# Patient Record
Sex: Male | Born: 1945 | Race: White | Hispanic: No | Smoking: Former smoker
Health system: Southern US, Community
[De-identification: ages and names within clinical notes are randomized; demographics above are authoritative.]

## PROBLEM LIST (undated history)

## (undated) DIAGNOSIS — E785 Hyperlipidemia, unspecified: Secondary | ICD-10-CM

## (undated) DIAGNOSIS — R0600 Dyspnea, unspecified: Secondary | ICD-10-CM

## (undated) DIAGNOSIS — Z720 Tobacco use: Secondary | ICD-10-CM

## (undated) DIAGNOSIS — I10 Essential (primary) hypertension: Secondary | ICD-10-CM

## (undated) DIAGNOSIS — R001 Bradycardia, unspecified: Secondary | ICD-10-CM

## (undated) DIAGNOSIS — I2510 Atherosclerotic heart disease of native coronary artery without angina pectoris: Secondary | ICD-10-CM

## (undated) DIAGNOSIS — I739 Peripheral vascular disease, unspecified: Secondary | ICD-10-CM

## (undated) DIAGNOSIS — Z87442 Personal history of urinary calculi: Secondary | ICD-10-CM

## (undated) HISTORY — PX: APPENDECTOMY: SHX54

## (undated) HISTORY — DX: Hyperlipidemia, unspecified: E78.5

## (undated) HISTORY — DX: Peripheral vascular disease, unspecified: I73.9

## (undated) HISTORY — DX: Bradycardia, unspecified: R00.1

## (undated) HISTORY — DX: Atherosclerotic heart disease of native coronary artery without angina pectoris: I25.10

## (undated) HISTORY — PX: ROTATOR CUFF REPAIR: SHX139

## (undated) HISTORY — PX: KNEE ARTHROPLASTY: SHX992

## (undated) HISTORY — DX: Tobacco use: Z72.0

## (undated) HISTORY — PX: FOOT SURGERY: SHX648

## (undated) HISTORY — PX: SHOULDER SURGERY: SHX246

---

## 2001-08-25 HISTORY — PX: CHOLECYSTECTOMY: SHX55

## 2006-08-25 HISTORY — PX: CORONARY ARTERY BYPASS GRAFT: SHX141

## 2006-12-07 ENCOUNTER — Ambulatory Visit: Payer: Self-pay | Admitting: Cardiology

## 2006-12-10 ENCOUNTER — Ambulatory Visit: Payer: Self-pay | Admitting: Cardiovascular Disease

## 2006-12-10 ENCOUNTER — Inpatient Hospital Stay (HOSPITAL_BASED_OUTPATIENT_CLINIC_OR_DEPARTMENT_OTHER): Admission: RE | Admit: 2006-12-10 | Discharge: 2006-12-10 | Payer: Self-pay | Admitting: Cardiovascular Disease

## 2006-12-17 ENCOUNTER — Ambulatory Visit (HOSPITAL_COMMUNITY): Admission: RE | Admit: 2006-12-17 | Discharge: 2006-12-17 | Payer: Self-pay | Admitting: Cardiothoracic Surgery

## 2006-12-17 ENCOUNTER — Ambulatory Visit: Payer: Self-pay | Admitting: Cardiothoracic Surgery

## 2006-12-22 ENCOUNTER — Ambulatory Visit: Payer: Self-pay | Admitting: Cardiology

## 2006-12-22 ENCOUNTER — Inpatient Hospital Stay (HOSPITAL_COMMUNITY): Admission: RE | Admit: 2006-12-22 | Discharge: 2006-12-27 | Payer: Self-pay | Admitting: Cardiothoracic Surgery

## 2006-12-22 ENCOUNTER — Ambulatory Visit: Payer: Self-pay | Admitting: Cardiothoracic Surgery

## 2007-01-15 ENCOUNTER — Ambulatory Visit: Payer: Self-pay | Admitting: Physician Assistant

## 2007-01-15 ENCOUNTER — Encounter: Payer: Self-pay | Admitting: Cardiology

## 2007-01-21 ENCOUNTER — Ambulatory Visit: Payer: Self-pay | Admitting: Cardiothoracic Surgery

## 2007-02-11 ENCOUNTER — Ambulatory Visit: Payer: Self-pay | Admitting: Cardiothoracic Surgery

## 2007-03-16 ENCOUNTER — Ambulatory Visit (HOSPITAL_COMMUNITY): Admission: RE | Admit: 2007-03-16 | Discharge: 2007-03-16 | Payer: Self-pay | Admitting: Cardiology

## 2007-03-16 ENCOUNTER — Ambulatory Visit: Payer: Self-pay | Admitting: Cardiology

## 2007-09-28 ENCOUNTER — Ambulatory Visit: Payer: Self-pay | Admitting: Cardiology

## 2007-10-21 ENCOUNTER — Encounter: Payer: Self-pay | Admitting: Cardiology

## 2008-04-25 ENCOUNTER — Ambulatory Visit: Payer: Self-pay | Admitting: Cardiology

## 2009-06-02 DIAGNOSIS — I2510 Atherosclerotic heart disease of native coronary artery without angina pectoris: Secondary | ICD-10-CM | POA: Insufficient documentation

## 2009-06-02 DIAGNOSIS — E785 Hyperlipidemia, unspecified: Secondary | ICD-10-CM | POA: Insufficient documentation

## 2009-06-25 ENCOUNTER — Telehealth (INDEPENDENT_AMBULATORY_CARE_PROVIDER_SITE_OTHER): Payer: Self-pay | Admitting: *Deleted

## 2009-07-11 ENCOUNTER — Ambulatory Visit: Payer: Self-pay | Admitting: Cardiology

## 2009-07-11 DIAGNOSIS — I10 Essential (primary) hypertension: Secondary | ICD-10-CM | POA: Insufficient documentation

## 2009-07-18 ENCOUNTER — Encounter: Payer: Self-pay | Admitting: Cardiology

## 2009-08-01 ENCOUNTER — Telehealth (INDEPENDENT_AMBULATORY_CARE_PROVIDER_SITE_OTHER): Payer: Self-pay | Admitting: *Deleted

## 2009-08-01 ENCOUNTER — Encounter: Payer: Self-pay | Admitting: Cardiology

## 2009-10-04 ENCOUNTER — Encounter: Payer: Self-pay | Admitting: Cardiology

## 2009-10-10 ENCOUNTER — Encounter: Payer: Self-pay | Admitting: Cardiology

## 2009-12-10 ENCOUNTER — Telehealth: Payer: Self-pay | Admitting: Cardiology

## 2010-01-17 ENCOUNTER — Encounter: Payer: Self-pay | Admitting: Cardiology

## 2010-01-17 ENCOUNTER — Encounter (INDEPENDENT_AMBULATORY_CARE_PROVIDER_SITE_OTHER): Payer: Self-pay | Admitting: *Deleted

## 2010-01-30 ENCOUNTER — Encounter: Payer: Self-pay | Admitting: Cardiology

## 2010-01-31 ENCOUNTER — Ambulatory Visit: Payer: Self-pay | Admitting: Cardiology

## 2010-02-07 ENCOUNTER — Ambulatory Visit: Payer: Self-pay | Admitting: Cardiology

## 2010-02-28 ENCOUNTER — Telehealth (INDEPENDENT_AMBULATORY_CARE_PROVIDER_SITE_OTHER): Payer: Self-pay | Admitting: *Deleted

## 2010-03-02 ENCOUNTER — Encounter: Payer: Self-pay | Admitting: Cardiology

## 2010-03-03 ENCOUNTER — Encounter: Payer: Self-pay | Admitting: Cardiology

## 2010-03-03 ENCOUNTER — Ambulatory Visit: Payer: Self-pay | Admitting: Cardiology

## 2010-03-07 ENCOUNTER — Telehealth: Payer: Self-pay | Admitting: Cardiology

## 2010-03-08 ENCOUNTER — Ambulatory Visit: Payer: Self-pay | Admitting: Cardiology

## 2010-03-08 DIAGNOSIS — Z720 Tobacco use: Secondary | ICD-10-CM

## 2010-03-08 DIAGNOSIS — R109 Unspecified abdominal pain: Secondary | ICD-10-CM | POA: Insufficient documentation

## 2010-03-13 ENCOUNTER — Encounter (INDEPENDENT_AMBULATORY_CARE_PROVIDER_SITE_OTHER): Payer: Self-pay | Admitting: *Deleted

## 2010-03-31 ENCOUNTER — Encounter: Payer: Self-pay | Admitting: Cardiology

## 2010-04-01 ENCOUNTER — Encounter: Payer: Self-pay | Admitting: Cardiology

## 2010-04-03 ENCOUNTER — Encounter: Payer: Self-pay | Admitting: Cardiology

## 2010-04-04 ENCOUNTER — Encounter: Payer: Self-pay | Admitting: Cardiology

## 2010-05-03 ENCOUNTER — Encounter: Payer: Self-pay | Admitting: Cardiology

## 2010-08-30 ENCOUNTER — Encounter: Payer: Self-pay | Admitting: Cardiology

## 2010-09-18 ENCOUNTER — Ambulatory Visit
Admission: RE | Admit: 2010-09-18 | Discharge: 2010-09-18 | Payer: Self-pay | Source: Home / Self Care | Attending: Cardiology | Admitting: Cardiology

## 2010-09-24 NOTE — Letter (Signed)
Summary: External Correspondence/ [facility]  External Correspondence/ [facility]   Imported By: Dorise Hiss 03/11/2010 10:31:45  _____________________________________________________________________  External Attachment:    Type:   Image     Comment:   External Document

## 2010-09-24 NOTE — Letter (Signed)
Summary: Engineer, materials at [facility]  [address]   [place], [place] [address]   Phone: [phone]  Fax: [phone]        March 13, 2010 MRN: [id number]    Jack [name] [address] [place], [place]  [address]    Dear Mr. Shelvin,  Your test ordered by Selena Batten has been reviewed by your physician (or physician assistant) and was found to be normal or stable. Your physician (or physician assistant) felt no changes were needed at this time.  ____ Echocardiogram  ____ Cardiac Stress Test  __X__ Lab Work-No anemia  ____ Peripheral vascular study of arms, legs or neck  ____ CT scan or X-ray  ____ Lung or Breathing test  ____ Other:   Thank you.   Cyril Loosen, RN, BSN    Duane Boston, M.D., F.A.C.C. Thressa Sheller, M.D., F.A.C.C. Oneal Grout, M.D., F.A.C.C. Cheree Ditto, M.D., F.A.C.C. Daiva Nakayama, M.D., F.A.C.C. Kenney Houseman, M.D., F.A.C.C. Jeanne Ivan, PA-C

## 2010-09-24 NOTE — Letter (Signed)
Summary: Discharge [facility]  Discharge [facility]   Imported By: Dorise Hiss 03/07/2010 10:02:43  _____________________________________________________________________  External Attachment:    Type:   Image     Comment:   External Document

## 2010-09-24 NOTE — Miscellaneous (Signed)
Summary: Orders Update - FLP/LFT  Clinical Lists Changes  Orders: Added new Test order of T-Hepatic Function ([id number]) - Signed Added new Test order of T-Lipid Profile ([id number]) - Signed 

## 2010-09-24 NOTE — Consult Note (Signed)
Summary: Consultation Report/ CARDIOLOGY  Consultation Report/ CARDIOLOGY   Imported By: Dorise Hiss 03/08/2010 08:24:09  _____________________________________________________________________  External Attachment:    Type:   Image     Comment:   External Document

## 2010-09-24 NOTE — Letter (Signed)
Summary: Pharmacist, community at [facility]. [address]   [place], [place] [address]   Phone: [phone]  Fax: [phone]      [facility]  Cardiolite Stress Test     [place]  Your doctor has ordered a Cardiolite Stress Test to help determine the condition of your heart during stress. If you take blood pressure medicine, ask your doctor if you should take it the day of your test. You should not have anything to eat or drink at least 4 hours before your test is scheduled, and no caffeine (coffee, tea, decaf. or chocolate) for 24 hours before your test.  You will need to register at the Outpatient/Main Entrance at the hospital 30 minutes before your appointment time. It is a good idea to bring a copy of your order with you. They will direct you to the Diagnostic Imaging (Radiology) Department.  You will be asked to undress from the waist up and be given a hospital gown to wear, so dress comfortably from the waist down, for example:    Sweat pants, shorts or skirt   Rubber-soled lace up shoes (i.e. tennis shoes)  Plan on about three hours from registration to release from the hospital.    Date of Test:              Time of Test              Hold Lasix morning of the test  Hold Carvedilol night before and the morning of the test.

## 2010-09-24 NOTE — Progress Notes (Signed)
Summary: PHONE   Phone Note Call from Patient Call back at Home Phone [phone]   Caller: Patient Summary of Call: Has 6 month appt with you on 5/29 states that he thought he was suppose to have a stress test befor office visit. Do not see in last office note you requested any type of test. Can you please advise. Initial call taken by: Zachary George,  December 10, 2009 9:08 AM  Follow-up for Phone Call        Yes he is correct. 3 years post CABG. he remembered correctly from last office visit. Schedule exercise cardiolite before OV Lewayne Bunting, MD, [facility]  December 11, 2009 2:41 PM

## 2010-09-24 NOTE — Assessment & Plan Note (Signed)
Summary: 6 month fu      Allergies Added: NKDA  Visit Type:  Follow-up Primary Provider:  [name]   History of Present Illness: the patient is a 65 year old male with history of coronary arteries, status post coronary bypass grafting with normal LV function in 2008. The patient has a routine myocardial perfusion study. He had relatively poor exercise tolerance but denies any chest pain or shortness of breath. He achieved 4.6 mets. myocardial perfusion imaging showed an inferior scar with some hypokinesis but overall preserved ejection fraction of 55%. The patient denies any chest pain or short of breath. Unfortunate continues to smoke three quarters of a pack a day. He states that is not willing to cut down. He denies any loud snoring or apneic episodes. Is on high-dose statin in his lipid panel as well controlled with an LDL of 58. His HDL is low at 25. The patient is morbidly obese but still gets around pretty good  Preventive Screening-Counseling & Management  Alcohol-Tobacco     Smoking Status: current     Smoking Cessation Counseling: yes     Packs/Day: 3/4 PPD  Current Medications (verified): 1)  Aspirin 325 Mg Tabs (Aspirin) .... Take 1 Tablet By Mouth Once A Day 2)  Lipitor 40 Mg Tabs (Atorvastatin Calcium) .... Take 1 Tablet By Mouth Once A Day 3)  Allopurinol 300 Mg Tabs (Allopurinol) .... Take 1 Tablet By Mouth Once A Day 4)  Zestril 20 Mg Tabs (Lisinopril) .... Take 1 Tablet By Mouth Once A Day  (Place On File) 5)  Celebrex 200 Mg Caps (Celecoxib) .... Take 1 Tablet By Mouth Once A Day 6)  Klor-Con 10 10 Meq Cr-Tabs (Potassium Chloride) .... Take 1 Tablet By Mouth Once A Day 7)  Furosemide 20 Mg Tabs (Furosemide) .... Take 1 Tablet By Mouth Once A Day 8)  Carvedilol 6.25 Mg Tabs (Carvedilol) .... Take 1 Tablet By Mouth Two Times A Day 9)  Lipitor 80 Mg Tabs (Atorvastatin Calcium) .... Take 1 Tab By Mouth At Bedtime  Allergies (verified): No Known Drug  Allergies  Comments:  Nurse/Medical Assistant: The patient is currently on medications but does not know the name or dosage at this time. Instructed to contact our office with details. Will update medication list at that time.  Past History:  Past Medical History: Last updated: 07/11/2009 HYPERLIPIDEMIA-MIXED (ICD-272.4) CAD, ARTERY BYPASS GRAFT (ICD-414.04)  1. Coronary artery disease.     a.     Status post coronary artery bypass graft x4.     b.     Ejection fraction of 79%.     c.     Cardiolite study with normalization of ejection fraction and      no ischemia most recently. 2. Tobacco use. 3. Hyperlipidemia. 4. Status post recent shoulder surgery.   Past Surgical History: Last updated: 06/02/2009 foot Appendectomy Knee Arthroplasty-Total CABG Rotator Cuff Repair  Family History: Last updated: 07/11/2009 Negative FH of Diabetes, Hypertension, or Coronary Artery Disease  Social History: Last updated: 06/02/2009 Full Time Married  Tobacco Use - Yes.  Alcohol Use - yes Regular Exercise - yes  Risk Factors: Smoking Status: current (02/07/2010) Packs/Day: 3/4 PPD (02/07/2010)  Review of Systems       The patient complains of weight gain/loss.  The patient denies fatigue, malaise, fever, vision loss, decreased hearing, hoarseness, chest pain, palpitations, shortness of breath, prolonged cough, wheezing, sleep apnea, coughing up blood, abdominal pain, blood in stool, nausea, vomiting, diarrhea, heartburn, incontinence, blood in  urine, muscle weakness, joint pain, leg swelling, rash, skin lesions, headache, fainting, dizziness, depression, anxiety, enlarged lymph nodes, easy bruising or bleeding, and environmental allergies.    Vital Signs:  Patient profile:   65 year old male Height:      72 inches Weight:      319 pounds Pulse rate:   60 / minute BP sitting:   144 / 75  (left arm) Cuff size:   large  Vitals Entered By: Carlye Grippe (February 07, 2010 8:55  AM)  Physical Exam  Additional Exam:  General: Well-developed, well-nourished in no distress head: Normocephalic and atraumatic eyes PERRLA/EOMI intact, conjunctiva and lids normal nose: No deformity or lesions mouth normal dentition, normal posterior pharynx neck: Supple, no JVD.  No masses, thyromegaly or abnormal cervical nodes lungs: Normal breath sounds bilaterally without wheezing.  Normal percussion heart: regular rate and rhythm with normal S1 and S2, no S3 or S4.  PMI is normal.  No pathological murmurs abdomen: Normal bowel sounds, abdomen is soft and nontender without masses, organomegaly or hernias noted.  No hepatosplenomegaly musculoskeletal: Back normal, normal gait muscle strength and tone normal pulsus: Pulse is normal in all 4 extremities Extremities: No peripheral pitting edema neurologic: Alert and oriented x 3 skin: Intact without lesions or rashes cervical nodes: No significant adenopathy psychologic: Normal affect    Impression & Recommendations:  Problem # 1:  ESSENTIAL HYPERTENSION, BENIGN (ICD-401.1) blood pressure is well controlled. I made no changes in his medical regimen I made no changes in his medical regimen His updated medication list for this problem includes:    Aspirin 325 Mg Tabs (Aspirin) .Marland Kitchen... Take 1 tablet by mouth once a day    Zestril 20 Mg Tabs (Lisinopril) .Marland Kitchen... Take 1 tablet by mouth once a day  (place on file)    Furosemide 20 Mg Tabs (Furosemide) .Marland Kitchen... Take 1 tablet by mouth once a day    Carvedilol 6.25 Mg Tabs (Carvedilol) .Marland Kitchen... Take 1 tablet by mouth two times a day  Problem # 2:  HYPERLIPIDEMIA-MIXED (ICD-272.4) patient's LDL is at goal. HDL is low and given the recent data of the AIM_HIGH study no indication for adding Niaspan. His updated medication list for this problem includes:    Lipitor 40 Mg Tabs (Atorvastatin calcium) .Marland Kitchen... Take 1 tablet by mouth once a day    Lipitor 80 Mg Tabs (Atorvastatin calcium) .Marland Kitchen... Take 1 tab  by mouth at bedtime  Problem # 3:  CAD, ARTERY BYPASS GRAFT (ICD-414.04) the patient is a coronary bypass grafting. It appears that inferior scar but with normal LV function. Given the fact that he has no symptoms I do not think we need to proceed with cardiac catheterization. I did counsel the patient has tobacco use told to take aspirin daily basis and continue high-dose Lipitor. His updated medication list for this problem includes:    Aspirin 325 Mg Tabs (Aspirin) .Marland Kitchen... Take 1 tablet by mouth once a day    Zestril 20 Mg Tabs (Lisinopril) .Marland Kitchen... Take 1 tablet by mouth once a day  (place on file)    Carvedilol 6.25 Mg Tabs (Carvedilol) .Marland Kitchen... Take 1 tablet by mouth two times a day  Patient Instructions: 1)  Your physician wants you to follow-up in: 6 months. You will receive a reminder letter in the mail one-two months in advance. If you don't receive a letter, please call our office to schedule the follow-up appointment. 2)  Your physician recommends that you continue on your current  medications as directed. Please refer to the Current Medication list given to you today. Prescriptions: ZESTRIL 20 MG TABS (LISINOPRIL) Take 1 tablet by mouth once a day  (PLACE ON FILE)  #90 x 3   Entered by:   Cyril Loosen, RN, BSN   Authorized by:   Lewayne Bunting, MD, [facility]   Signed by:   Cyril Loosen, RN, BSN on 02/07/2010   Method used:   Electronically to        [facility] [address] (retail)       [address]       [place], [place]  [address]       Ph: [phone] or [phone]       Fax: [phone]   RxID:   [id number]

## 2010-09-24 NOTE — Assessment & Plan Note (Signed)
SummaryChauncy Lean [facility] 7/9      Allergies Added: NKDA  Visit Type:  hospital follow-up Primary Provider:  Dr. Samuel Jester  CC:  CAD.  History of Present Illness: The patient presents for evaluation of abdominal discomfort. The discomfort was severe and unlike previous angina. It moved up to his chest and then back down to his abdomen. It was there all day. He was fatigued. He said this morning he had 2 dark black stools which were formed but loose. He says he has not had discomfort since then. He denies any nausea or vomiting or hematochezia.  He's had no hematemesis. He does not report shortness of breath, PND or orthopnea. He does not report palpitations, presyncope or syncope. He was hospitalized briefly on the ninth for this same discomfort which was felt to be reflux. He was seen by Dr. Andee Lineman but not felt to have a cardiac etiology to any of his complaints.  Current Medications (verified): 1)  Aspirin 325 Mg Tabs (Aspirin) .... Take 1 Tablet By Mouth Once A Day 2)  Lipitor 40 Mg Tabs (Atorvastatin Calcium) .... Take 1 Tablet By Mouth Once A Day 3)  Allopurinol 300 Mg Tabs (Allopurinol) .... Take 1 Tablet By Mouth Once A Day 4)  Zestril 20 Mg Tabs (Lisinopril) .... Take 1 Tablet By Mouth Once A Day  (Place On File) 5)  Celebrex 200 Mg Caps (Celecoxib) .... Take 1 Tablet By Mouth Once A Day 6)  Klor-Con 10 10 Meq Cr-Tabs (Potassium Chloride) .... Take 1 Tablet By Mouth Once A Day 7)  Furosemide 20 Mg Tabs (Furosemide) .... Take 1 Tablet By Mouth Once A Day 8)  Carvedilol 6.25 Mg Tabs (Carvedilol) .... Take 1 Tablet By Mouth Two Times A Day 9)  Lipitor 80 Mg Tabs (Atorvastatin Calcium) .... Take 1 Tab By Mouth At Bedtime  Allergies (verified): No Known Drug Allergies  Comments:  Nurse/Medical Assistant: The patient is currently on medications but does not know the name or dosage at this time. Instructed to contact our office with details. Will update medication list at that  time.  Past History:  Past Medical History: HYPERLIPIDEMIA-MIXED (ICD-272.4) CAD, ARTERY BYPASS GRAFT (ICD-414.04) 1. Coronary artery disease.     a.     Status post coronary artery bypass graft x4.     b.     Ejection fraction of 79%.     c.     Cardiolite study with normalization of ejection fraction and      no ischemia most recently. 2. Tobacco use. 3. Hyperlipidemia. 4. Status post recent shoulder surgery.   Past Surgical History: Foot Appendectomy Knee Arthroplasty-Total CABG Rotator Cuff Repair  Review of Systems       As stated in the HPI and negative for all other systems.   Vital Signs:  Patient profile:   65 year old male Height:      72 inches Weight:      318 pounds O2 Sat:      95 % on Room air Temp:     98.5 degrees F oral Pulse rate:   65 / minute BP sitting:   114 / 77  (left arm) Cuff size:   large  Vitals Entered By: Carlye Grippe (March 08, 2010 11:13 AM)  O2 Flow:  Room air CC: CAD   Physical Exam  General:  Well developed, well nourished, in no acute distress. Head:  normocephalic and atraumatic Eyes:  PERRLA/EOM intact; conjunctiva and lids normal. Mouth:  Edentulous. Oral mucosa normal. Neck:  Neck supple, no JVD. No masses, thyromegaly or abnormal cervical nodes. Chest Wall:  Well-healed sternotomy scar Lungs:  Clear bilaterally to auscultation and percussion. Abdomen:  Bowel sounds positive; abdomen soft and non-tender without masses, organomegaly, or hernias noted. No hepatosplenomegaly, obese Msk:  Back normal, normal gait. Muscle strength and tone normal. Pulses:  pulses normal in all 4 extremities Extremities:  No clubbing or cyanosis. Neurologic:  Alert and oriented x 3. Skin:  Intact without lesions or rashes. Cervical Nodes:  no significant adenopathy Psych:  Normal affect.   Detailed Cardiovascular Exam  Neck    Carotids: Carotids full and equal bilaterally without bruits.      Neck Veins: Normal, no JVD.     Heart    Inspection: no deformities or lifts noted.      Palpation: normal PMI with no thrills palpable.      Auscultation: regular rate and rhythm, S1, S2 without murmurs, rubs, gallops, or clicks.    Vascular    Abdominal Aorta: no palpable masses, pulsations, or audible bruits.      Femoral Pulses: normal femoral pulses bilaterally.      Pedal Pulses: normal pedal pulses bilaterally.      Radial Pulses: normal radial pulses bilaterally.      Peripheral Circulation: no clubbing, cyanosis, or edema noted with normal capillary refill.     EKG  Procedure date:  03/08/2010  Findings:      Sus rhythm, rate 69, axis within normal limits, intervals within normal limits, no change from previous EKG  Impression & Recommendations:  Problem # 1:  ABDOMINAL PAIN OTHER SPECIFIED SITE (ICD-789.09) The patient is describing abdominal pain that does not sound like previous cardiac pain. There is no objective evidence of ischemia. Rather I suspect a GI etiology and I am concerned about GI bleeding.  I have spoken with Dr. [name] who would check a stool guaiac today. We will send the patient for a priority CBC.  No further cardiovascular testing is suggested.  Problem # 2:  ESSENTIAL HYPERTENSION, BENIGN (ICD-401.1) His blood pressure is controlled. He will continue the meds as listed.  Problem # 3:  SMOKER (ICD-305.1) We again discussed the need to stop smoking.  Problem # 4:  CAD, ARTERY BYPASS GRAFT (ICD-414.04) As above. Orders: EKG w/ Interpretation (93000)  Other Orders: T-CBC No Diff ([id number])  Patient Instructions: 1)  Go to [facility] for lab work now. 2)  Go to Dr. Silvana Newness office at 1:30pmg today. 3)  You will follow up with Dr. Earnestine Leys around December 2011. A letter will be mailed when it is time to schedule this appt.

## 2010-09-24 NOTE — Progress Notes (Signed)
Summary: C/O STOMACH AND CHEST PAIN   Phone Note Call from Patient Call back at Home Phone [phone]   Caller: Spouse([name]) Call For: nurse Summary of Call: patient c/o stomach and chest (chest pain now resolved)that started this morning took TUMS,and pepto-misbol per wife and now only has stomach discomfort. Patient denies having diarrhea nausea,fever, chills,headache,dizziness, SOB or chest pain. Patient rated stomach pain a 5 on scale of 1-10 (10 the greatest pain) . Wife stated that PCP [name] infomred them to ride out the stomach pain since he may have a virus. Wife informed nurse that patient was supposed to f/u with MD this week after being in hospital last week. Nurse gave appt for tomorrow at 11:15am with Hatem Cull. Patient informed wife that if patient have any dizziness,chest pain,SOB,n/v, to proceed to ED. Wife verbalized understanding of plan.    Initial call taken by: Carlye Grippe,  March 07, 2010 4:52 PM

## 2010-09-24 NOTE — Progress Notes (Signed)
Summary: weakness   Phone Note Other Incoming   Caller: wife Summary of Call: States hushand is c/o no energy and weakness.  Was just seen on 6/16 and was stable at that time and was not due to return for 6 months.   Does not have blood pressure machine at home so unable to give any readings.  Did check the other day at [facility] and it was 112/59.  Advised him to see PMD [name]) for evaluation since Dr. Andee Lineman is [place].  If she feels like this is a cardiac issue, then she can call and request appt. with another provider before his return.  Wife verbalized understanding.

## 2010-09-24 NOTE — Miscellaneous (Signed)
Summary: Orders Update  Clinical Lists Changes  Orders: Added new Referral order of Nuclear Med (Nuc Med) - Signed 

## 2010-09-26 NOTE — Assessment & Plan Note (Signed)
Summary: 6 mo ful  Medications Added LISINOPRIL 10 MG TABS (LISINOPRIL) Take 1 tablet by mouth once a day CYMBALTA 30 MG CPEP (DULOXETINE HCL) Take 1 tablet by mouth once a day CYANOCOBALAMIN 1000 MCG/ML SOLN (CYANOCOBALAMIN) montly ALPRAZOLAM 1 MG TABS (ALPRAZOLAM) Take 1 tablet by mouth once a day at bedtime DRISDOL 28413 UNIT CAPS (ERGOCALCIFEROL) take one by mouth twice weekly MULTIVITAMINS  TABS (MULTIPLE VITAMIN) Take 1 tablet by mouth once a day VITAMIN D 1000 UNIT TABS (CHOLECALCIFEROL) Take 1 tablet by mouth once a day ANDROGEL PUMP 1.25 GM/ACT (1%) GEL (TESTOSTERONE) apply 2 pumps topically daily      Allergies Added: NKDA  Visit Type:  Follow-up Primary Provider:  Dr. Samuel Jester   History of Present Illness: the patient is a 65 year old male with a history of coronary artery disease status post coronary bypass grafting unfortunately still smoking.  The patient had a Cardiolite stress study done in June of 2011 inferior scar was noted but no ischemia and normal LV function.  He has had no recurrent substernal chest pain or heart failure symptoms.  Over the last several months the patient had several episodes of abdominal pain and required multiple hospital admissions until he was finally diagnosed with a gangrenous gallbladder.  He underwent cholecystectomy.  He's now doing much better and his abdominal pain has resolved.  The patient denies any chest pain.  He does have some shortness of breath which is chronic particular when he carries wood from the basement he developed some shortness of breath but no chest  He has no orthopnea PND.  He states in general he's been feeling bad but no particular cardiac related.  Apparently had blood work done and he was noted to have vitamin D and vitamin B12 deficiency as well as low testosterone.  The patient is receiving supplementation and states it is feeling better.  His LDL cholesterol 66 mg percent.  His blood pressure slightly  elevated today but he states that typically drums normal as confirmed during the last office visit with Dr. [name].  Preventive Screening-Counseling & Management  Alcohol-Tobacco     Smoking Status: current     Smoking Cessation Counseling: yes     Packs/Day: <1/2 PPD  Current Medications (verified): 1)  Aspirin 325 Mg Tabs (Aspirin) .... Take 1 Tablet By Mouth Once A Day 2)  Lipitor 40 Mg Tabs (Atorvastatin Calcium) .... Take 1 Tablet By Mouth Once A Day 3)  Lisinopril 10 Mg Tabs (Lisinopril) .... Take 1 Tablet By Mouth Once A Day 4)  Celebrex 200 Mg Caps (Celecoxib) .... Take 1 Tablet By Mouth Once A Day 5)  Carvedilol 6.25 Mg Tabs (Carvedilol) .... Take 1 Tablet By Mouth Two Times A Day 6)  Cymbalta 30 Mg Cpep (Duloxetine Hcl) .... Take 1 Tablet By Mouth Once A Day 7)  Cyanocobalamin 1000 Mcg/ml Soln (Cyanocobalamin) .... Montly 8)  Alprazolam 1 Mg Tabs (Alprazolam) .... Take 1 Tablet By Mouth Once A Day At Bedtime 9)  Drisdol 24401 Unit Caps (Ergocalciferol) .... Take One By Mouth Twice Weekly 10)  Multivitamins  Tabs (Multiple Vitamin) .... Take 1 Tablet By Mouth Once A Day 11)  Vitamin D 1000 Unit Tabs (Cholecalciferol) .... Take 1 Tablet By Mouth Once A Day 12)  Androgel Pump 1.25 Gm/act (1%) Gel (Testosterone) .... Apply 2 Pumps Topically Daily  Allergies (verified): No Known Drug Allergies  Comments:  Nurse/Medical Assistant: The patient's medication list and allergies were reviewed with the patient and were  updated in the Medication and Allergy Lists.  Past History:  Past Medical History: Last updated: 03/08/2010 HYPERLIPIDEMIA-MIXED (ICD-272.4) CAD, ARTERY BYPASS GRAFT (ICD-414.04) 1. Coronary artery disease.     a.     Status post coronary artery bypass graft x4.     b.     Ejection fraction of 79%.     c.     Cardiolite study with normalization of ejection fraction and      no ischemia most recently. 2. Tobacco use. 3. Hyperlipidemia. 4. Status post recent  shoulder surgery.   Past Surgical History: Last updated: 03/08/2010 Foot Appendectomy Knee Arthroplasty-Total CABG Rotator Cuff Repair  Family History: Last updated: 07/11/2009 Negative FH of Diabetes, Hypertension, or Coronary Artery Disease  Social History: Last updated: 06/02/2009 Full Time Married  Tobacco Use - Yes.  Alcohol Use - yes Regular Exercise - yes  Risk Factors: Smoking Status: current (09/18/2010) Packs/Day: <1/2 PPD (09/18/2010)  Social History: Packs/Day:  <1/2 PPD  Review of Systems       The patient complains of fatigue, weight gain/loss, and shortness of breath.  The patient denies malaise, fever, vision loss, decreased hearing, hoarseness, chest pain, palpitations, prolonged cough, wheezing, sleep apnea, coughing up blood, abdominal pain, blood in stool, nausea, vomiting, diarrhea, heartburn, incontinence, blood in urine, muscle weakness, joint pain, leg swelling, rash, skin lesions, headache, fainting, dizziness, depression, anxiety, enlarged lymph nodes, easy bruising or bleeding, and environmental allergies.    Vital Signs:  Patient profile:   65 year old male Height:      72 inches Weight:      300 pounds BMI:     40.83 Pulse rate:   56 / minute BP sitting:   149 / 83  (left arm) Cuff size:   large  Vitals Entered By: Carlye Grippe (September 18, 2010 11:18 AM)  Nutrition Counseling: Patient's BMI is greater than 25 and therefore counseled on weight management options.  Physical Exam  Additional Exam:  General: Well-developed, well-nourished in no distress head: Normocephalic and atraumatic eyes PERRLA/EOMI intact, conjunctiva and lids normal nose: No deformity or lesions mouth normal dentition, normal posterior pharynx neck: Supple, no JVD.  No masses, thyromegaly or abnormal cervical nodes lungs: Normal breath sounds bilaterally without wheezing.  Normal percussion heart: regular rate and rhythm with normal S1 and S2, no S3 or S4.   PMI is normal.  No pathological murmurs abdomen: Normal bowel sounds, abdomen is soft and nontender without masses, organomegaly or hernias noted.  No hepatosplenomegaly musculoskeletal: Back normal, normal gait muscle strength and tone normal pulsus: Pulse is normal in all 4 extremities Extremities: No peripheral pitting edema neurologic: Alert and oriented x 3 skin: Intact without lesions or rashes cervical nodes: No significant adenopathy psychologic: Normal affect    Impression & Recommendations:  Problem # 1:  ABDOMINAL PAIN OTHER SPECIFIED SITE (ICD-789.09) abdominal pain resolved.  Status-post cholecystectomy  Problem # 2:  ESSENTIAL HYPERTENSION, BENIGN (ICD-401.1) blood pressure followed by the patient's primary care physician and in general controlled.  Will make no changes in his medical therapy. The following medications were removed from the medication list:    Furosemide 20 Mg Tabs (Furosemide) .Marland Kitchen... Take 1 tablet by mouth once a day His updated medication list for this problem includes:    Aspirin 325 Mg Tabs (Aspirin) .Marland Kitchen... Take 1 tablet by mouth once a day    Lisinopril 10 Mg Tabs (Lisinopril) .Marland Kitchen... Take 1 tablet by mouth once a day    Carvedilol 6.25  Mg Tabs (Carvedilol) .Marland Kitchen... Take 1 tablet by mouth two times a day  Problem # 3:  HYPERLIPIDEMIA-MIXED (ICD-272.4) LDL is at goal at 66 mg percent also followed by his primary care physician The following medications were removed from the medication list:    Lipitor 80 Mg Tabs (Atorvastatin calcium) .Marland Kitchen... Take 1 tab by mouth at bedtime His updated medication list for this problem includes:    Lipitor 40 Mg Tabs (Atorvastatin calcium) .Marland Kitchen... Take 1 tablet by mouth once a day  Problem # 4:  CAD, ARTERY BYPASS GRAFT (ICD-414.04) the patient has no recurrent chest pain.  He is stable and does not need any ischemia testing. His updated medication list for this problem includes:    Aspirin 325 Mg Tabs (Aspirin) .Marland Kitchen... Take 1  tablet by mouth once a day    Lisinopril 10 Mg Tabs (Lisinopril) .Marland Kitchen... Take 1 tablet by mouth once a day    Carvedilol 6.25 Mg Tabs (Carvedilol) .Marland Kitchen... Take 1 tablet by mouth two times a day  Patient Instructions: 1)  Your physician recommends that you continue on your current medications as directed. Please refer to the Current Medication list given to you today. 2)  Follow up in  1 year

## 2010-09-26 NOTE — Letter (Signed)
Summary: [facility] D/C DR. Bea Laura  [facility] D/C DR. GAIL GERENA   Imported By: Zachary George 09/18/2010 10:35:34  _____________________________________________________________________  External Attachment:    Type:   Image     Comment:   External Document

## 2010-09-26 NOTE — Medication Information (Signed)
Summary: [facility] D/C MEDICATION SHEET ORDER  [facility] D/C MEDICATION SHEET ORDER   Imported By: Zachary George 09/18/2010 10:29:44  _____________________________________________________________________  External Attachment:    Type:   Image     Comment:   External Document

## 2010-09-26 NOTE — Consult Note (Signed)
Summary: PULMONARY CONSULT/ DR. HENDERSON  PULMONARY CONSULT/ DR. HENDERSON   Imported By: Zachary George 09/18/2010 10:36:08  _____________________________________________________________________  External Attachment:    Type:   Image     Comment:   External Document

## 2011-01-07 NOTE — Procedures (Signed)
NAMESANTI, TROUNG NO.:  [id number]   MEDICAL RECORD NO.:  [id number]          PATIENT TYPE:  OUT   LOCATION:  RAD                           FACILITY:  [facility]   PHYSICIAN:  Gerrit Friends. Dietrich Pates, MD, FACCDATE OF BIRTH:  15-Jul-1946   DATE OF PROCEDURE:  03/16/2007  DATE OF DISCHARGE:                                ECHOCARDIOGRAM   REFERRING PHYSICIAN:  Learta Codding, MD,FACC   CLINICAL DATA:  A 65 year old gentleman with coronary artery disease,  having undergone CABG surgery.   M-mode aorta 3.7, left atrium 4.0, septum 1.4, posterior wall 1.3, LV  diastole 4.6, LV systole 3.8.   1. Technically adequate echocardiographic study.  2. Normal left and right atrial size.  3. The right ventricle is prominent, at least from the parasternal      view.  There is borderline RVH with normal RV systolic function.  4. Mild aortic valvular sclerosis.  5. Normal diameter of the proximal ascending aorta; mild calcification      of the wall.  6. Normal mitral valve; mild annular calcification.  7. Grossly normal tricuspid valve.  8. Pulmonic valve and proximal pulmonary artery not adequately imaged;      normal forward flows by Doppler without significant regurgitation.  9. Normal left ventricular size; abnormal septal motion with septal      hypokinesis; no significant LVH; mildly impaired overall LV      systolic function; estimated ejection fraction 0.50.  10.Normal IVC.      Gerrit Friends. Dietrich Pates, MD, [facility]  Electronically Signed     RMR/MEDQ  D:  03/16/2007  T:  03/16/2007  Job:  [id number]

## 2011-01-07 NOTE — Assessment & Plan Note (Signed)
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  The patient underwent  bypass grafting on December 22, 2006.  He underwent cardiac bypass grafting  times four.  He has no LV dysfunctionwith ejection of approximately 40%.  The patient's postoperative course was uneventful.  He has been doing  well over the last year.  The patient required shoulder surgery in  [place], [place] at the end of last year.  The surgeon requested a  cardiology evaluation.  The patient had a Cardiolite stress study done  which showed inferior scar but no ischemia and an ejection fraction of  55%.  The patient in the interim also reports no substernal chest pain,  shortness of breath, orthopnea or PND.  He is doing remarkably well and  is compliant with his medical regimen.   MEDICATIONS:  1. Aspirin 325 daily  2. Lipitor 40 mg p.o. daily  3. Carvedilol 6.5 mg p.o. t.i.d.  4. Allopurinol 300 mg  p.o. daily  5. Lisinopril 10 mg p.o. daily  6. Celebrex 200 mg p.o. daily  7. Klor-Con 10 mEq p.o. daily  8. Furosemide 20 mg p.o. daily.   PHYSICAL EXAMINATION:  VITAL SIGNS:  Blood pressure is 128/79, heart  rate 73,  NECK EXAM:  Normal carotid stroke and no carotid bruits.  LUNGS:  Clear breath sounds bilaterally.  HEART:  Regular rate and rhythm with a normal S1-S2.  No murmur, rubs or  gallops.  ABDOMEN is soft, nontender, no rebound or guarding.  Good  bowel sounds.  EXTREMITY EXAM:  No cyanosis, clubbing or edema.  NEURO:  Patient alert, oriented, exam nonfocal.   PROBLEMS:  1. Coronary artery disease.      a.     Status post coronary artery bypass graft x4.  b.     Prior ejection fraction of 39%  with inferior akinesis.      c.     Most recent Cardiolite study with normalization of ejection       fraction and no ischemia  2. Tobacco use  3. Dyslipidemia.  Cholesterol from 02/2007 well controlled.  4. Status post recent shoulder surgery.   PLAN:  1. The patient is scheduled to undergo now shoulder surgery on the      other shoulder.  From a cardiac standpoint he is clear to undergo      surgery as he has no unstable symptoms and had a recent stress test      done which was within normal limits, i.e. there was no ischemia      noted, and  ejection fraction was normal.  2. I refilled the patient's medications in particular lisinopril 10 mg      p.o. daily.Marland Kitchen  3.  The patient will follow up with [place] in 6 months.  4. I have asked the patient to discuss Dr. [name] repeat laboratory      testing and a cholesterol panel.  5. The patient will come by our office tomorrow to pick up this note      as a clearance for surgery in the near future.     Learta Codding, MD,FACC  Electronically Signed    GED/MedQ  DD: 09/28/2007  DT: 09/28/2007  Job #: [id number]

## 2011-01-07 NOTE — Assessment & Plan Note (Signed)
OFFICE VISIT   RICHMOND, COLDREN  DOB:  26-Dec-1945                                        Jan 21, 2007  CHART #:  [id number]   HISTORY OF PRESENT ILLNESS:  Mr.  Fildes returns to the office today  after recent coronary artery bypass grafting done on December 22, 2006, and  discharged home on Dec 27, 2006.  At that time, he had coronary artery  bypass grafting x4 with left internal mammary to left anterior  descending coronary artery reversed saphenous vein graft to the  intermediate, reversed saphenous vein graft to the posterior descending  and posterolateral branches to the right coronary artery.  He had known  depressed evidence of preserved systolic function, but at the time of  surgery was noted to have very significant severe left ventricular  hypertrophy.  Preoperative echo showed mild to moderate inferior  ischemia and ejection fraction of 39%.  Since surgery, the patient is  progressing well, increasing in his physical activities appropriately.  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  He has had no recurrent angina or evidence of congestive  heart failure.   PHYSICAL EXAMINATION:  VITAL SIGNS:  Blood pressure 124/84, pulse 89 and  regular, respirations 18, O2 saturation 95%.  STERNUM:  Stable and well-healed.  I do not appreciate any murmurs.  LUNGS:  Clear bilaterally.  EXTREMITIES:  Lower extremities are without edema.  The endovein harvest  site is healing well.   MEDICATIONS:  1. Aspirin.  2. Coreg 6.25 b.i.d.  3. Lipitor 40 mg daily.  4. Cymbalta.  5. Chantix.  6. Discharged home on Lisinopril 5 mg daily, and this has been      increased by Dr. Andee Lineman to 10 mg daily.   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  We discussed cardiac rehabilitation, but financial  restraints will prevent him from going to cardiac rehabilitation.  He is  anxious to return to  work in [place].  In 3-4 weeks, he could return  to light duty work with no lifting and doing office work.  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  We will leave this to the discretion of  Cardiology.   Unfortunately, the patient had his postoperative film done at diagnostic  center in [place] and did not have a copy of the film or report.  When he  returns home to [place], he will request that they send the report to me.  Overall, I am pleased with this progress.  We will plan to see him back  as necessary.   Sheliah Plane, MD  Electronically Signed   EG/MEDQ  D:  01/21/2007  T:  01/21/2007  Job:  [id number]   cc:   Learta Codding, MD,FACC  Samuel Jester

## 2011-01-07 NOTE — Assessment & Plan Note (Signed)
[name]                          [name] OFFICE NOTE   Jack [name], GAGLIO                        MRN:          [id number]  DATE:01/15/2007                            DOB:          1946/01/05    CARDIOLOGIST:  He is new to Dr. Andee [name]   PRIMARY CARE PHYSICIAN:  Dr. Samuel [name]   HISTORY OF PRESENT ILLNESS:  [name] is a 65 year old male patient,  who presents back to the office today for post-hospitalization followup.  He recently was evaluated with an abnormal stress test and  echocardiogram.  He was sent for cardiac catheterization.  This revealed  three-vessel CAD with significant left mainstem disease and occluded  RCA.  His EF was 40% at the time of the catheterization.  He was  therefore sent for coronary artery bypass grafting.  This was performed  by Dr. Tyrone [name].  The grafts included a LIMA to the LAD, vein graft to  the circumflex, vein graft to the posterior descending and  posterolateral arteries.  His postoperative course was fairly  uneventful.  He returns to the office today for followup.  He still does  have some chest soreness.  This is improving.  Denies any significant  fevers or chills.  He does have occasional cough, but this is not  productive.  There is no hemoptysis.  Denies any shortness of breath,  orthopnea or paroxysmal nocturnal dyspnea.  Denies any lower extremity  edema.  His weights continue to go down.  He is walking about 15 minutes  a day.  He notes that he has decreased his smoking from two to three  packs of cigarettes per day to two or three cigarettes per day!  He does  note some dysuria.  This continues to improve since he left the  [name].  There is no hesitancy or urgency noted.  He does note some  constipation.   CURRENT MEDICATIONS:  1. Aspirin 325 mg daily.  2. Coreg 6.25 mg b.i.d.  3. Lipitor 40 mg daily.  4. Cymbalta 60 mg daily.  5. Chantix b.i.d.  6. Lisinopril 5 mg daily.  7. Hydrocodone and oxycodone p.r.n.   ALLERGIES:  No known drug allergies.   PHYSICAL EXAM:  He is a well-nourished, well-developed male, in no  distress.  Blood pressure is 141/81, pulse 79, weight 284 pounds.  HEENT:  Normal.  CARDIAC:  Normal S1, S2.  Regular rate and rhythm without murmurs.  LUNGS:  Clear to auscultation bilaterally without wheezing, rhonchi or  rales.  ABDOMEN:  Soft, nontender, with normoactive bowel sounds, no  organomegaly.  EXTREMITIES:  Without edema.  Calves are soft, nontender.  SKIN:  Warm and dry.  NEUROLOGIC:  He is alert and oriented times three.  Cranial nerves II  through XII are grossly intact.  Chest wounds are healing well.  His chest tube wounds are slowly  healing, but there is no erythema or discharge to suggest infection.  Leg wounds are healing well.  Bilateral femoral arteriotomy sites are  without hematoma or bruit.   Electrocardiogram is pending.   IMPRESSION:  1. Coronary artery disease, status post CABG by Dr.[name], December 22, 2006, with the grafts as noted above.  2. Ischemic cardiomyopathy with an EF of 40%.  3. Hypertension.  4. Hyperlipidemia.  5. Osteoarthritis.      a.     Status post left rotator cuff tear, awaiting surgery.  6. Tobacco abuse.  7. Obesity.   PLAN:  The patient presents back to the office today for post-  hospitalization followup.  He is doing well from a postsurgical  standpoint.  He will see Dr. Tyrone [name] next week.  We will get him set up  for his chest x-ray today.  He does have an ischemic cardiomyopathy and  an EF of about 40%.  His blood pressure will certainly tolerate increase  in his ACE inhibitor.  I have increased his lisinopril to 10 mg a day.  We will check a BMET and follow up in the next one to two weeks.  He  will need followup on his lipids and LFTs in about six to eight weeks.  We will also set him up for a followup echocardiogram in about six weeks  to reassess his LV function,  status post revascularization surgery.  I  will have him follow up with Dr. Andee [name] in about eight weeks time.  He  does have some constipation.  I have recommended over-the-counter  Colace.  He also notes some dysuria and I have recommended a urinalysis  with urine culture and sensitivity.  As long as this is negative, he can  follow up further with this with his primary care physician.   He is having difficulty with finances.  He does not think he will be  able to afford cardiac rehab.  I have encouraged him to continue to walk  at home and to try to achieve 30 minutes per day.  He would like to go  back to work at [name] duty.  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  I think,  from a cardiac standpoint, he should wait about six to eight weeks post-  surgery before he does this.  However, I will defer any other  restrictions to Dr. Tyrone [name], whom he sees next week.  If he is cleared  to go back to work at [name] duty by Dr. Tyrone [name], I would suggest that  he go back at about ten hours per week initially and slowly increase  from there.   He is getting Crestor samples from his primary care physician.  I have  recommended that he let us know what dose he is on, in case we need to  change his dose.  I also explained to him that we could always switch  him to Zocor, which is $4 a month, if need be.   As noted above, he will follow up with Dr. Andee [name] in two months.      Jack Newcomer, PA-C  Electronically Signed      Jack Codding, MD,FACC  Electronically Signed   SW/MedQ  DD: 01/15/2007  DT: 01/15/2007  Job #: [id number]   cc:   Jack [name]

## 2011-01-07 NOTE — Letter (Signed)
March 16, 2007     RE:  Jack, [name]  MRN:  [id number]  /  DOB:  20-Jun-1946   To Whom It May Concern,   [name] is followed in our practice for coronary artery disease.  Patient is status post coronary artery bypass grafting by Dr. Tyrone Sage  in December 22, 2006 at [facility] in [place].  The patient has  been able to return to light duty.  His postoperative course has been  unremarkable.  He is doing well with no residual shortness of breath or  chest pain.  At this point in time, the patient is hemodynamically  stable.  At low cardiovascular risk to proceed with left shoulder  surgery.   This letter serves as an official notification that the patient can  proceed at this point in time with shoulder surgery.   If you have any further questions regarding this notification, please do  not hesitate to contact Dr. Andee Lineman at [facility] at [phone].    Sincerely,      Learta Codding, MD,FACC  Electronically Signed    GED/MedQ  DD: 03/16/2007  DT: 03/16/2007  Job #: [id number]   CC:    Samuel Jester

## 2011-01-07 NOTE — Letter (Signed)
Jan 06, 2007     RE:  [name], [name]  MRN:  [id number]  /  DOB:  02/13/1946   To whom it may concern:   Dear [name], this letter is to confirm that [name] underwent  recent coronary artery bypass grafting on December 22, 2006.  The patient  underwent coronary artery bypass grafting x4 after he presented with  abnormal noninvasive studies and was being evaluated as part of a  preoperative evaluation.   The patient had successful surgery and is currently in the convalescent  phase of his procedure.   The patient at the present time is unable to return to work until he has  been released both from my office and the care of Dr. Sheliah Plane,  his cardiovascular surgeon.   If you have any further questions regarding this notification, please do  not hesitate to contact me.    Sincerely,      Learta Codding, MD,FACC    GED/MedQ  DD: 01/06/2007  DT: 01/06/2007  Job #: [id number]

## 2011-01-07 NOTE — Assessment & Plan Note (Signed)
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  The patient underwent bypass  grafting in April 2008.  He has been doing remarkably well.  His  ejection fraction has normalized.  He has no chest pain, shortness of  breath, orthopnea, or PND.  He went successful to do recent shoulder  surgery without any undue cardiac events.  He was essentially  asymptomatic from a cardiac perspective.   MEDICATIONS:  1. Aspirin 325 mg p.o. daily.  2. Lipitor 40 mg daily.  3. Allopurinol 300 mg p.o. daily.  4. Lisinopril 10 mg p.o. daily.  5. Celebrex 200 mg p.o. daily.  6. Klor-Con 10 mg p.o. daily.  7. Furosemide 20 mg p.o. daily.  8. Carvedilol 6.25 p.o. b.i.d.   PHYSICAL EXAMINATION:  VITAL SIGNS:  Blood pressure 126/84, heart rate  74, and weight 326 pounds.  NECK:  Normal with carotid upstrokes.  No carotid bruits.  LUNGS:  Clear, breath sounds bilaterally.  HEART:  Regular rate and rhythm.  Normal S1 and S2.  No murmurs or  gallops.  ABDOMEN:  Soft.  EXTREMITIES:  No cyanosis, clubbing, or edema.  NEURO:  The patient is alert, oriented, and grossly nonfocal.   PROBLEMS:  1. Coronary artery disease.      a.     Status post coronary artery bypass graft x4.      b.     Ejection fraction of 79%.      c.     Cardiolite study with normalization of ejection fraction and       no ischemia most recently.  2. Tobacco use.  3. Hyperlipidemia.  4. Status post recent shoulder surgery.   PLAN:  The patient is doing well from a cardiac perspective.  I made no  changes in the present regimen.  The patient can follow up with [place] in 1  year.    Learta Codding, MD,FACC  Electronically  Signed   GED/MedQ  DD: 04/25/2008  DT: 04/26/2008  Job #: [id number]

## 2011-01-07 NOTE — Letter (Signed)
February 01, 2007     RE:  JAMEEK, BRUNTZ  MRN:  [id number]  /  DOB:  08-Dec-1945   Cardiologist:  Dr. Lewayne Bunting.  Primary Care Physician:  Dr. Samuel Jester.   To Whom It May Concern,   Mr. Prevette is a patient followed in our practice for coronary artery  disease. He is status post coronary artery bypass grafting surgery by  Dr. Tyrone Sage, December 22, 2006 at [facility] in [place]. The  patient will be able to return to light duty work on or after February 11, 2007. This should include no lifting. 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 I  have advised the patient to start back at reduced hours initially and to  increase his hours as tolerated. In light of the patient's medical  condition, he should be allowed to decide on the number of hours he will  work per day in the beginning of his return to work. He is not cleared  to return back to full duty at this point in time. Patient can work 5-6  hours a day or more when he feels like doing it.  He apparently has a  pending shoulder surgery. We will not be able to clear him for this  surgery before he is seen back in the office by Dr. Andee Lineman on March 17, 2007. We will create another letter for him once we think it is safe for  him to return back to full duty. If you have any questions please feel  free to contact either myself or Dr. Andee Lineman.    Sincerely,  Beatrice Lecher, PA-C, MHS    Sincerely,      Tereso Newcomer, PA-C  Electronically Signed    SW/MedQ  DD: 02/01/2007  DT: 02/01/2007  Job #: [id number]

## 2011-01-07 NOTE — Assessment & Plan Note (Signed)
[facility]                          [name]   Jack [name], Jack [name]                        MRN:          [id number]  DATE:03/16/2007                            DOB:          April 23, 1946    REFERRING PHYSICIAN:  Evelene Croon, M.D.   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  The patient underwent bypass  graft on December 22, 2006.  He underwent cardiac bypass grafting x4.  He  has known LV dysfunction, with an ejection fraction of 39%.  The  patient's postoperative course has been uneventful.  He denies any chest  pressure, shortness of breath, orthopnea, PND.  He stated that he is  markedly improved.  He does report some dizziness, which appears to be  associated with relatively low blood pressures during the daytime.   The patient has not resumed full work yet, but needs to be cleared for  left shoulder surgery.   MEDICATIONS:  1. Aspirin 325 mg p.o. daily  2. Lipitor 40 mg p.o. daily  3. Cymbalta 60 mg p.o. daily.  4. Chantix 1 mg p.o. b.i.d.  5. Carvedilol 6.25 mg p.o. daily.  6. Allopurinol 300 mg p.o. daily.  7. Lisinopril 10 mg p.o. daily.  8. Oxycodone and Percocet p.r.n.   PHYSICAL EXAMINATION:  VITAL SIGNS:  Blood pressure 132/80, heart rate  81 beats per minute, weight 285 pounds.  NECK:  Normal carotid upstroke and no carotid bruits.  LUNGS:  Clear breath sounds bilaterally.  HEART:  Regular rate and rhythm.  Normal sinus rhythm.  No murmurs, rubs  or gallops.  ABDOMEN:  Soft and nontender.  EXTREMITIES:  No clubbing, cyanosis or edema.  NEUROLOGIC:  Patient is alert and oriented, grossly nonfocal.   PROBLEM LIST:  1. Coronary artery disease.  2. Status post coronary bypass grafting x4.  .  3. Mild left ventricular dysfunction.  Ejection fraction 39%.      Hemodynamically well compensated.  4. Tobacco use.  5. Dizziness with relative  hypotension.   PLAN:  1. The patient appears to have somewhat low blood pressure during the      daytime.  I have asked him to take his Lisinopril in the evening.      We will defer from up-titrating heart failure medications for now.  2. The patient has an echocardiographic study at the [facility].  The results are not available yet. We will relate these      to him.  3. We will clear the patient for shoulder surgery.  At this point in      time there are no cardiovascular contraindications.  4. The patient will follow up with [place] in 6 months.     Learta Codding, MD,FACC  Electronically Signed    GED/MedQ  DD: 03/16/2007  DT: 03/16/2007  Job #: [id number]   cc:   Samuel Jester

## 2011-01-10 NOTE — Discharge Summary (Signed)
NAMEDAWUD, MAYS NO.:  [id number]   MEDICAL RECORD NO.:  [id number]          PATIENT TYPE:  INP   LOCATION:  2014                         FACILITY:  [facility]   PHYSICIAN:  Sheliah Plane, MD    DATE OF BIRTH:  [birth year]   DATE OF ADMISSION:  12/22/2006  DATE OF DISCHARGE:                               DISCHARGE SUMMARY   PRIMARY ADMITTING DIAGNOSIS:  Coronary artery disease.   ADDITIONAL/DISCHARGE DIAGNOSES:  1. Coronary artery disease.  2. Arthritis.  3. Hypertension.  4. Hyperlipidemia.  5. History of tobacco abuse.  6. History of left rotator cuff tear awaiting surgery.  7. Ischemic cardiomyopathy.  8. Mild postoperative blood loss anemia.   PROCEDURES PERFORMED:  1. Coronary artery bypass grafting x4 (left internal mammary artery to      the LAD, saphenous vein graft to the circumflex, sequential      saphenous vein graft to posterior descending and posterolateral      arteries).  2. Endoscopic vein harvest left leg.   HISTORY OF PRESENT ILLNESS:  The patient is a 65 year old male who has a  history of left rotator cuff repair and was originally scheduled for  repair on Monday, April 7.  During his preoperative surgical clearance  he was found to have an abnormal resting EKG and subsequently referred  to cardiology for further evaluation.  He had an adenosine stress test  and a 2-D echocardiogram.  The stress test suggested severely decreased  inferior wall function with mild to moderate redistribution and ejection  fraction of 39%.  His echocardiogram suggested moderate left ventricular  dysfunction with an ejection fraction of 40% with mild to moderate  mitral regurgitation.  These tests were originally done in [place],  [place], but because the patient lives in [place] he was subsequently  referred to Dr. Tonny Bollman and ultimately underwent cardiac  catheterization.  This showed total occlusion of his right coronary  artery and a  70-80% stenosis in the distal left main with disease in the  proximal circumflex and LAD.  He denies any symptoms of chest pain, but  has had some exertional shortness of breath.  Because of his symptoms  and his significant disease, he was referred to Dr. Sheliah Plane for  surgical consideration.  Dr. Tyrone Sage reviewed the patient's films, and  after examination the patient felt that he would benefit from surgical  revascularization based on his significant distal left main disease.  He  explained the risks, benefits, and alternatives of the procedure to the  patient, and he agreed to proceed with surgery.  Because he had been on  Celebrex, it was felt that this should be held prior to surgery.  He was  scheduled for outpatient admission on December 22, 2006.   HOSPITAL COURSE:  He was admitted to [facility] on December 22, 2006 and was taken to the operating room where he underwent CABG x4 as  described in detail above.  He tolerated the procedure well and was  transferred to the SICU in stable condition.  He was able to be  extubated shortly after surgery.  He was hemodynamically stable and  doing well on postop day #1.  He initially required dopamine and Neo-  Synephrine drips, but these were weaned and discontinued over the course  of his first postoperative day.  His chest tubes were removed as well as  hemodynamic monitoring devices, and by postop day #2 was ready for  transfer to the floor.  It was felt because of his history of ischemic  cardiomyopathy that he should be started on Coreg for beta blockade.  This was initiated as well as a low dose ACE inhibitor.  Cardiology also  advised discontinuation of Celebrex and continuation of Tylenol or  narcotic analgesics for his left rotator cuff tear and at a later date  perhaps naproxen  could be considered if he continues to have problems  prior to his surgery.  He has been doing well overall postoperatively.  He has had a  mild acute blood loss anemia which is improving and has not  required transfusion.  He has been volume overloaded, and presently is  being diuresed with Lasix and is back down to his preoperative weight.  He continues to have some lower extremity edema and evidence of fluid  overload on physical exam.  His incisions are all healing well.  He has  remained afebrile, and all vital signs have been stable.  His labs on  postop day #3 showed hemoglobin of 10.4, hematocrit 31, platelets 134,  white count 12.9, sodium 135, potassium 4.3, BUN 25, creatinine 0.93.  He is ambulating the halls without difficulty.  He has been tolerating a  regular diet and having normal bowel and bladder function.  It is felt  that if he continues to remain stable and no acute changes occur, he  will hopefully be ready for discharge home in the next 48 hours.   DISCHARGE MEDICATIONS:  1. Enteric-coated aspirin 325 mg daily.  2. Coreg 6.25 mg b.i.d.  3. Lisinopril 5 mg daily.  4. Lipitor 40 mg q.h.s.  5. Lasix 40 mg daily x5 days.  6. Potassium 20 mEq daily x5 days.  7. Tylox 1-2 q.4 h. p.r.n. for pain.   DISCHARGE INSTRUCTIONS:  He is asked to refrain from driving, heavy  lifting or strenuous activity.  He may continue ambulating daily and  using his incentive spirometer.  He may shower daily and clean his  incisions with soap and water.  He is encouraged to discontinue smoking.   DISCHARGE FOLLOWUP:  He will see Dr. Excell Seltzer back in two weeks and have  chest x-ray at that visit.  He will then follow up with Dr. Tyrone Sage in  three weeks, and our office will contact him with an appointment.  If he  experiences problems or has questions in the interim, he is asked to  contact our office immediately.      Coral Ceo, P.A.      Sheliah Plane, MD  Electronically Signed    GC/MEDQ  D:  12/25/2006  T:  12/25/2006  Job:  [id number]   cc:   Bufford Buttner. Excell Seltzer, MD

## 2011-01-10 NOTE — Cardiovascular Report (Signed)
NAMETREYTON, SLIMP NO.:  [id number]   MEDICAL RECORD NO.:  [id number]          PATIENT TYPE:  OIB   LOCATION:  1965                         FACILITY:  [facility]   PHYSICIAN:  Veverly Fells. Excell Seltzer, MD  DATE OF BIRTH:  04-21-1946   DATE OF PROCEDURE:  DATE OF DISCHARGE:                            CARDIAC CATHETERIZATION   PROCEDURES:  1. Left heart catheterization.  2. Selective coronary angiography.  3. Left ventricular angiography.   INDICATIONS:  Mr. Brickle is a 65 year old gentleman who underwent a  preoperative stress evaluation in [place], [place].  His nuclear  stress study showed a large area of inferior wall infarction with peri-  infarct ischemia and a gated ejection fraction of 39%.  He presented to  Dr. Margarita Mail office for evaluation and was referred for cardiac  catheterization in the setting of his high-risk stress study.   Risks and indications of procedure were explained to the patient.  Informed consent was obtained.  The right groin was prepped, draped, and  anesthetized with 1% lidocaine.  Using the modified Seldinger technique,  a 4-French sheath was placed in the right femoral artery and a JL-4  catheter was advanced into the left coronary artery.  And after an  initial image was taken, there was a lot of bleeding around the sheath  site.  I elected to change out the 4-French sheath for a 5-French  sheath, hoping that the up-sizing of the sheath would tamponade the  bleeding.  This change out was done over a long of 0.35 wire but even  after that there was still a good deal of oozing around the groin.  I  used fluoroscopy to image the area and the sheath had come out of the  vessel and was coiled in the soft tissue of the right groin area.  I  tried to reposition that using the dilator but was unable to straighten  wire out at that point.  We elected to pull the wire out and hold manual  pressure for hemostasis.  During this, the patient  became markedly  bradycardic with a heart rate of approximately 30.  He was given 1 mg of  atropine and responded well.  His fluids were opened up.  After his  hemodynamics stabilized, the left groin was prepped and draped and a  long 5-French sheath was placed over a 0.38 inches guidewire without  difficulty.  Fluoroscopy was used for sheath insertion.  At that point,  multiple views of the left and right coronary arteries were taken with  standard preformed Judkins catheters.  The pigtail was then inserted in  the left ventricle and pressures were recorded.  A left ventriculogram  was performed, a pullback across the aortic valve was done.  At the  completion of the procedure, the sheath was pulled and manual pressure  used for hemostasis.   FINDINGS:  Aortic pressure 112/59 with a mean of 81, left ventricular  pressure 117/0, with an end-diastolic pressure of 5.   Left main stem has 70% stenosis in its distal aspect.  It trifurcates  into the  LAD, intermediate branch, and left circumflex.   The LAD is a large-caliber vessel that is heavily calcified.  It has a  50% ostial stenosis.  It gives off a first diagonal branch that is  medium caliber.  The mid and distal LAD have calcification but no  significant angiographic disease.   The intermediate branch is large-caliber.  It appears to have a 50-70%  ostial stenosis.  The remaining portions of the vessel are normal.   The left circumflex also has ostial stenosis.  It is a medium size  vessel gives off first OM branch.  Its ostial stenosis appears mild.   The right coronary artery is heavily calcified.  It is occluded in its  proximal portion.  The distal vessel is supplied via left-to-right  collaterals.   Left ventricular function assessed in the 30 degrees RAO view  demonstrated large area of inferolateral akinesis.  The LVEF is  estimated at 40%.  The apex is mildly hypokinesia.   ASSESSMENT:  1. Three-vessel coronary  artery disease with significant left main      stem disease and an occluded right coronary artery.  2. Moderate left mainstem disease.  3. Moderate left anterior descending artery disease.  4. Moderate ramus intermedius disease.  5. Mild left circumflex disease.  6. Mild to moderate left ventricular dysfunction.   Mr. Keir will require coronary bypass surgery.  I will place a  consult so that he is seen as an outpatient.      Veverly Fells. Excell Seltzer, MD  Electronically Signed     MDC/MEDQ  D:  12/10/2006  T:  12/10/2006  Job:  [id number]   cc:   Learta Codding, MD,FACC  Oren Beckmann, Dr.

## 2011-01-10 NOTE — Consult Note (Signed)
[name], [name] NO.:  [id number]   MEDICAL RECORD NO.:  [id number]          PATIENT TYPE:  OUT   LOCATION:  XRAY                         FACILITY:  [facility]   PHYSICIAN:  Sheliah Plane, MD    DATE OF BIRTH:  07-12-1946   DATE OF CONSULTATION:  DATE OF DISCHARGE:                                 CONSULTATION   REQUESTING PHYSICIAN:  Jack Fells. Excell [name], M.D.   FOLLOW-UP CARDIOLOGIST:  Jack Fells. Excell [name], M.D.   PRIMARY CARE PHYSICIAN:  Dr. Samuel [name].   REASON FOR CONSULTATION:  Coronary occlusive disease.   HISTORY OF PRESENT ILLNESS:  Patient is a 65 year old male with no  previous history of cardiac disease, who underwent outpatient testing  for preoperative surgical clearance.  These were done in [facility] in [place], [place].  Patient was to have repair  of the left rotator cuff on Monday, April 7th but was found to have an  abnormal resting EKG and was referred to cardiology for evaluation.  He  had an Adenosine stress test and a 2D echocardiogram.  The stress test  suggested severely decreased large area of inferior wall function with  mild-to-moderate redistribution and ejection fraction of 39%.  A 2D echo  also suggested moderate LV dysfunction with an ejection fraction of 40%  with mild-to-moderate mitral regurgitation.  These results were reviewed  with the patient, and his surgery was delayed.  Because he lives in  [place], he was seen by Dr. Excell [name], and cardiac catheterization was carried  out, which showed total occlusion of the right coronary artery and 70-  80% stenosis in the distal left main, and disease in the proximal  circumflex and LAD.   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  He has known  cardiac risk factors.  The patient denies any history of  hyperlipidemia  or hypertension but recently was started on antihypertensive medications  and on Lipitor.  Denies diabetes.  He is a long-term smoker, up to a  pack a day for the past two months and has been a heavier smoker before  that and smoked for 47 years.   FAMILY HISTORY:  His father had coronary artery disease and died of a  brain aneurysm.  Mother is alive.  She had a myocardial infarction in  the past.   Patient denies stroke.  He does have claudication in his right hip.  He  has no renal insufficiency.   PAST MEDICAL HISTORY:  Significant for arthritis.   PAST SURGICAL HISTORY:  Right knee surgery in 2000.  Right foot surgery  in 1984.  Appendectomy in 1961.   SOCIAL HISTORY:  Patient is married.  Lives in [place] with his wife and  works as a Marine scientist.   MEDICATIONS:  1. Aspirin 325 mg a day.  2. Lopressor 25 b.i.d.  3. Nitroglycerin as needed but he notes he has not used it.  4. Hydrocodone  5/500 as needed for shoulder pain.  5. Celebrex 200 mg a day.  6. Lipitor 40 mg a day.   CARDIAC REVIEW OF SYSTEMS:  Negative for chest pain, resting shortness  of breath, palpitations, syncope, pre-syncope, orthopnea.  Does have  exertional shortness of breath at increased altitudes and does have  bilateral lower extremity edema.   REVIEW OF SYSTEMS:  No recent change in weight.  No nausea, fatigue, or  anorexia.  No fevers or chills.  Denies TIAs or amaurosis.  Denies any  hearing loss.  Denies hemoptysis.  Has no previous history of  gallbladder disease, melena, or hematochezia.  Has a history of  gallstones in the past.  A history of hematuria many years ago, none  recently.  He notes that this was told he had gallstones.  MUSCULOSKELETAL:  Does have painful joints.  He complains of right hip  pain, left shoulder pain, and bilateral wrist pain, left greater than  right.  Patient also has symptoms of right hip claudication.  Denies any  polyuria or polydipsia.   Denies vertigo or TIAs.   PHYSICAL EXAMINATION:  VITAL SIGNS:  Blood pressure 128/82, pulse 53,  respiratory rate 18, O2 sat 94%.  He is 6 feet, 1 inches tall, 285  pounds.  NECK:  He has no carotid bruits.  LUNGS:  Breath sounds are distant without active wheezing.  CARDIAC:  Regular rate and rhythm without murmur.  I do not appreciate  any murmur or mitral insufficiency.  ABDOMEN:  Obese without palpable masses.  EXTREMITIES:  He appears to have adequate veins in both lower  extremities.  He has 1+ DP/PT pulses on the left, absent on the right.   Cardiac catheterization films are reviewed.  Patient has total occlusion  of his right coronary artery and with collateral filling.  He has  moderate left main disease, up to 70-80% distal stenosis and 50-60% in  the proximal circumflex and LAD.  He has mild-to-moderate LV  dysfunction.  No mitral regurgitation is appreciated.   IMPRESSION:  Patient with abnormal stress test and echo, relatively  asymptomatic.  Presents with significant distal left main disease with  such anatomy, I agree with Dr. Earmon [name] recommendation to consider  coronary artery bypass grafting.  The risks and options were discussed  with the patient in detail.  He is strongly encouraged to stop smoking  immediately, as this obviously will significantly increase his risk of  pulmonary complications postop.  We will hold his Celebrex prior to  surgery also.  The risks of death, infection, stroke, myocardial  infarction, bleeding, blood transfusion, are all discussed with the  patient in detail, and he is willing to proceed.  We will tentatively  plan for surgery early on April 29th.      Sheliah Plane, MD  Electronically Signed     EG/MEDQ  D:  12/17/2006  T:  12/17/2006  Job:  [id number]   cc:   Jack Fells. Excell Seltzer, MD  Jack [name]

## 2011-01-10 NOTE — Assessment & Plan Note (Signed)
[facility]                          [name]   Jack [name], Jack [name]                   MRN:          [id number]  DATE:12/07/2006                            DOB:          May 28, 1946    REASON FOR CONSULTATION:  [name] is a 65 year old male, with no  prior cardiac history, who underwent recent outpatient testing for  preoperative surgical clearance. These studies, however, were done at  the [facility] in [place], [place], and were just  completed last week.  Patient was to have undergone repair of a left  rotator cuff tear last Monday April 7th, but was found to have an  abnormal resting electrocardiogram during preop evaluation. He was then  referred to Dr. Evon Slack in [place], [place], and  arrangements were made for an adenosine stress Thallium and a 2D  echocardiogram.   The stress test suggested severely decreased counts in a large area of  the inferior wall with mild/moderate redistribution; calculated ejection  fraction at 39%.   A 2D echocardiogram also suggested moderate LVD (ejection fraction 39%)  with mild/moderate mitral regurgitation.   These results were reviewed in full with the patient and his wife today.  He is here to discuss further evaluation and prefers to have any  subsequent cardiac workup done here in [place], [place], were he  resides. 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   Clinically, although the patient denies any history of exertional chest  discomfort, pressure, or tightness, he does admit to experiencing  exertional dyspnea with moderate activity or exercise. In fact, he was  recently walking on a treadmill at home and did have some significant  dyspnea. He did not, however, have any exertional chest discomfort.   A recent 12-lead electrocardiogram reveals normal sinus rhythm at 67 BMP  with normal axis and down slopping ST segment depression in the  inferolateral leads.   ALLERGIES:  No known drug allergies.   CURRENT MEDICATIONS:  1. Celebrex 200 mg b.i.d.  2. Hydrocodone p.r.n.   PAST MEDICAL HISTORY:  1. Hypertension.  2. Arthritis.   SURGICAL HISTORY:  Status post remote appendectomy, foot surgery, and  knee surgery in 2001.   SOCIAL HISTORY:  Patient is married, has 2 children and 6 grandchildren.  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 The trucking company is  headquartered in [place]. He has been smoking at least a pack a day  since the age of 53. He drinks an occasional beer, but denies any  history of abuse.   FAMILY HISTORY:  Father deceased age 43, complications of a cerebral  aneurysm. Mother age 56, history of myocardial infarction.   REVIEW OF SYSTEMS:  Denies history of diabetes mellitus, hyperlipidemia,  myocardial infarction, congestive heart failure, or symptoms suggestive  of reflux disease. Otherwise as per HPI, remaining systems  negative.   PHYSICAL EXAMINATION:  Blood pressure 128/85, pulse 71, regular, weight  285.6.  GENERAL: A 65 year old male, obese, sitting upright in no distress.  HEENT: Normocephalic, atraumatic.  NECK: Palpable bilateral carotid pulses without bruits; no JVD.  LUNGS: Diminished breath sounds but without crackles or wheezes.  HEART: Regular rate and rhythm (S1,S2). No significant murmurs. Positive  S4.  ABDOMEN: Protuberant, nontender with intact bowel sounds.  EXTREMITIES: Palpable bilateral femoral pulses without bruits: Intact  distal pulses without significant pedal edema.  NEURO: No focal deficit.   IMPRESSION:  1. Exertional dyspnea.      a.     Suspect anginal equivalent.  2.  Abnormal adenosine perfusion study.      a.     Suggestive of inferior ischemia.      b.     Moderate LVD (ejection fraction 39%).  3. Longstanding tobacco smoking.  4. History of hypertension.  5. Obesity.  6. Left rotator cuff tear.      a.     Awaiting a surgical repair.   PLAN:  Recommendation is to proceed with diagnostic coronary angiography  for further evaluation and exclusion of high grade coronary artery  disease. The risk/benefits of the procedure have been discussed with the  patient, and he is quite agreeable to proceed. Recommendation is to  arrange this as an elective procedure in our JV catheterization lab  later this week. Patient will need extensive blood work as well as a  baseline chest x-ray. He is to start on full dose aspirin, beta blocker,  and will be provided with a prescription for nitroglycerine. The patient  is also interested in smoking cessation and we will place him on a trial  of Chantix as  well. Further recommendations will be made pending review of his cardiac  catheterization results. Patient was seen and examined in conjunction  with Dr. Andee Lineman.      Gene Serpe, PA-C  Electronically Signed      Learta Codding, MD,FACC  Electronically Signed   GS/MedQ  DD: 12/07/2006  DT: 12/07/2006  Job #: [id number]   cc:   Bruna Potter

## 2011-01-10 NOTE — Op Note (Signed)
Jack [name], Jack [name] NO.:  [id number]   MEDICAL RECORD NO.:  [id number]          PATIENT TYPE:  INP   LOCATION:  2014                         FACILITY:  [facility]   PHYSICIAN:  Sheliah Plane, MD    DATE OF BIRTH:  1945-12-03   DATE OF PROCEDURE:  12/22/2006  DATE OF DISCHARGE:  12/27/2006                               OPERATIVE REPORT   PREOPERATIVE DIAGNOSIS:  Coronary occlusive disease.   POSTOPERATIVE DIAGNOSIS:  Coronary occlusive disease.   SURGICAL PROCEDURE:  Coronary artery bypass grafting x4 with the left  internal mammary to the left anterior descending coronary artery,  reversed saphenous vein graft to the intermediate coronary artery,  reversed saphenous vein graft sequentially to the posterior descending  and posterolateral branches of the right coronary artery, with left  thigh and calf endovein harvesting.   SURGEON:  Sheliah Plane, MD   FIRST ASSISTANT:  Coral Ceo, PA   BRIEF HISTORY:  The patient is a 65 year old male who presented with  increasing shoulder pain and was being considered for surgical  intervention.  Prior to this, cardiology clearance was requested; the  patient underwent evaluation by Dr. Excell Seltzer including cardiac stress test  and cardiac catheterization which revealed severe three-vessel coronary  artery disease including greater than 80% proximal LAD, proximal  circumflex disease and total occlusion of the right coronary artery.  With the severe three-vessel disease, coronary artery bypass grafting  was recommended; patient agreed and signed informed consent.   DESCRIPTION OF PROCEDURE:  With Swan-Ganz and arterial line monitors in  place, the patient underwent general endotracheal anesthesia without  incident.  The skin of the chest and legs was prepped with Betadine and  draped in the usual sterile manner.  Using the Guidant endovein  harvesting system, vein was harvested from the left thigh and calf and  was  adequate quality and caliber.  A median sternotomy was performed.  The left internal mammary artery was dissected down as a pedicle graft.  Distal artery was divided and had good free flow.  Pericardium was  opened.  Overall ventricular function was preserved, though the patient  did have evidence of severe left ventricular hypertrophy.  He was  systemically heparinized.  The ascending aorta was cannulated.  The  right atrium was cannulated and in the aortic root, vent cardioplegia  needle was introduced into the ascending aorta.  The patient was placed  on cardiopulmonary bypass at 2.4 L/min per sq m.  Sites of anastomosis  were selected and dissected out of the epicardium.  The patient's body  temperature was cooled to 30 degrees.  The aortic cross-clamp was  applied and 500 mL of cold blood and potassium cardioplegia were  administered with rapid diastolic arrest of the heart.  Myocardial  septal temperature was monitored throughout the crossclamp period.  Attention was turned first to the intermediate coronary artery, which  was deeply intramyocardial, but once located was of good quality and  size.  In the circumflex proper, the distal branches of this were less  than 1 mm and not suitable for bypass.  The intermediate in its  intramyocardial position was opened and admitted a 1.5-mm probe.  Using  a running 7-0 Prolene, distal anastomosis was performed.  Attention was  then turned to the posterior descending and posterolateral branches,  each of which were opened using a diamond-type side-to-side anastomosis.  A segment of reversed saphenous vein graft was anastomosed to the  posterior descending.  The distal extent of the same vein was then  carried to the smaller posterolateral branch and was approximately 1.2  mm in size.  Using a running 7-0, a distal anastomosis was performed.  Additional cold blood cardioplegia was administered down the vein  grafts.  Attention was then turned  to the left anterior descending  coronary artery, which was opened between the mid and distal third.  Using a running 8-0 Prolene, the left internal mammary artery was  anastomosed to the left anterior descending coronary artery.  With the  aortic cross-clamp still in place, 2 punch aortotomies were performed  and each of the 2 vein grafts were anastomosed to the ascending aorta.  Air was evacuated from the grafts and then the cross-clamp was removed.  Total cross-clamp time was 88 minutes.  The patient spontaneously  converted to a sinus rhythm.  Sites of anastomosis were inspected and  free of bleeding.  The patient was then ventilated and weaned from  cardiopulmonary bypass without difficulty.  He remained hemodynamically  stable.  He was decannulated in the usual fashion.  Protamine sulfate  was administered.  With the operative field hemostatic, 2 atrial and 2  ventricular pacing wires were applied.  Graft markers were applied.  Pericardium was loosely reapproximated.  Sternum was closed with #6  stainless steel wire.  A left pleural tube and a Blake drain was left in  the anterior mediastinum.  The fascia was closed with interrupted 0  Vicryl, running 3-0 Vicryl in the subcutaneous tissue and 4-0  subcuticular stitches in the skin edges.  Dry dressings were applied.  Sponge and needle count was reported as correct at the completion of the  procedure.  The patient tolerated the procedure without obvious  complication and was transferred to the surgical intensive care unit for  further postoperative care.      Sheliah Plane, MD  Electronically Signed     EG/MEDQ  D:  12/28/2006  T:  12/28/2006  Job:  [id number]   cc:   Veverly Fells. Excell Seltzer, MD

## 2011-02-21 ENCOUNTER — Encounter: Payer: Self-pay | Admitting: Cardiology

## 2011-09-25 ENCOUNTER — Ambulatory Visit: Payer: Self-pay | Admitting: Cardiology

## 2011-11-25 ENCOUNTER — Encounter: Payer: Self-pay | Admitting: Cardiology

## 2011-11-25 ENCOUNTER — Ambulatory Visit (INDEPENDENT_AMBULATORY_CARE_PROVIDER_SITE_OTHER): Payer: Medicare Other | Admitting: Cardiology

## 2011-11-25 VITALS — BP 120/75 | HR 53 | Ht 72.0 in | Wt 221.0 lb

## 2011-11-25 DIAGNOSIS — I10 Essential (primary) hypertension: Secondary | ICD-10-CM

## 2011-11-25 DIAGNOSIS — F17200 Nicotine dependence, unspecified, uncomplicated: Secondary | ICD-10-CM

## 2011-11-25 DIAGNOSIS — E785 Hyperlipidemia, unspecified: Secondary | ICD-10-CM

## 2011-11-25 DIAGNOSIS — I25810 Atherosclerosis of coronary artery bypass graft(s) without angina pectoris: Secondary | ICD-10-CM

## 2011-11-25 DIAGNOSIS — I2510 Atherosclerotic heart disease of native coronary artery without angina pectoris: Secondary | ICD-10-CM

## 2011-11-25 NOTE — Assessment & Plan Note (Signed)
Patient is very active and reports no chest pain or significant shortness of breath. He was concerned regarding an episode of what he thought was a myocardial infarction a month ago. As outlined above however this clearly was related to abdominal bloating after he drank but light. There is no evidence of a new myocardial infarction by his current EKG. The patient however is due for an echocardiogram because of financial reasons he wants to delay this. I haven't scheduled for an echocardiogram one week before his next clinic visit in 6 months.

## 2011-11-25 NOTE — Assessment & Plan Note (Signed)
Again I counseled the patient about this extensively he states that both him and his wife smoke which makes it harder to stop. Also he uses tobacco for his anxiety and depression.

## 2011-11-25 NOTE — Assessment & Plan Note (Signed)
Lipid panel followed by his primary care physician. The patient is on pitavistatin per Dr. [name] who gave him samples. We will leave further management to her.

## 2011-11-25 NOTE — Assessment & Plan Note (Signed)
Blood pressure is well controlled and from this perspective no medication adjustments are needed.

## 2011-11-25 NOTE — Patient Instructions (Signed)
Continue all current medications.  Echo in 6 months just before next visit Your physician wants you to follow up in: 6 months.  You will receive a reminder letter in the mail one-two months in advance.  If you don't receive a letter, please call our office to schedule the follow up appointment

## 2011-11-25 NOTE — Progress Notes (Signed)
Jack Bottoms, MD, [facility] ABIM Board Certified in Adult Cardiovascular Medicine,Internal Medicine and Critical Care Medicine    CC: Routine followup Asian with coronary artery disease, status post coronary artery bypass grafting in 2008  HPI:  The patient reports no chest pain shortness of breath orthopnea or PND unfortunately he continues to smoke and I counseled him about this. Despite his heavy tobacco consumption and coronary artery disease the patient is very active. 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 A month ago he reports an episode after he started work at 7:30 in the morning. He states that he had a long job which requires a lot of physical activity with loading and unloading. He went throughout the day with having lunch just having some nabs. When the job was done at 3:30 him and his brother went home and brought some but light. By the time he came home and sat at the porch with his wife he started having severe abdominal pain which he rated as 10 over 10 there was no substernal chest pain. He states all the pressure and bloating was below the diaphragm. This resolved eventually in approximately 30 minutes. I told the patient that this clearly was related to the heavy exertion, having no nutrition and then drinking gaseous beverages. He has had no recurrent problems. He also has no chest pain on exertion.  PMH: reviewed and listed in Problem List in Electronic Records (and see below) Past Medical History  Diagnosis Date  . Coronary atherosclerosis of artery bypass graft     Normal LV systolic function at 55%  . CAD (coronary artery disease) 2008    a. status post cabg x4 b. ef 79% c. cardiolite study with normalization of EF and no ischemia most recenlty  . HLD (hyperlipidemia)     LDL 58 low HDL at 25 on high-dose statin  . Tobacco user     One half to one pack a day   Past Surgical History  Procedure Date  . Foot  surgery   . Appendectomy   . Knee arthroplasty     total  . Coronary artery bypass graft   . Rotator cuff repair   . Shoulder surgery     status post recent     Allergies/SH/FHX : available in Electronic Records for review  No Known Allergies History   Social History  . Marital Status: Married    Spouse Name: N/A    Number of Children: N/A  . Years of Education: N/A   Occupational History  . Not on file.   Social History Main Topics  . Smoking status: Current Everyday Smoker -- 1.0 packs/day for 52 years    Types: Cigarettes  . Smokeless tobacco: Never Used  . Alcohol Use: Yes  . Drug Use: Not on file  . Sexually Active: Not on file   Other Topics Concern  . Not on file   Social History Narrative   Full time. Regularly exercises.    Family History  Problem Relation Age of Onset  . Coronary artery disease Neg Hx   . Diabetes Neg Hx   . Hypertension Neg Hx     Medications: Current Outpatient Prescriptions  Medication Sig Dispense Refill  . ALPRAZolam (XANAX) 1 MG tablet Take 1 mg by mouth daily as needed.       Marland Kitchen aspirin 325 MG tablet Take 325 mg by mouth daily.        Marland Kitchen  carvedilol (COREG) 6.25 MG tablet Take 6.25 mg by mouth every other day.       . celecoxib (CELEBREX) 200 MG capsule Take 200 mg by mouth every other day.       . cholecalciferol (VITAMIN D) 1000 UNITS tablet Take 1,000 Units by mouth daily.        . cyanocobalamin (,VITAMIN B-12,) 1000 MCG/ML injection Inject 1,000 mcg into the muscle every 30 (thirty) days.      . DULoxetine (CYMBALTA) 30 MG capsule Take 30 mg by mouth every other day.       . lisinopril (PRINIVIL,ZESTRIL) 10 MG tablet Take 10 mg by mouth every other day.      . oxyCODONE-acetaminophen (PERCOCET) 10-325 MG per tablet Take 1 tablet by mouth Every 4 hours as needed.      . Pitavastatin Calcium (LIVALO) 4 MG TABS Take 1 tablet by mouth every other day.      . testosterone cypionate (DEPOTESTOTERONE CYPIONATE) 200 MG/ML injection  Inject 200 mg into the muscle every 28 (twenty-eight) days.        ROS: No nausea or vomiting. No fever or chills.No melena or hematochezia.No bleeding.No claudication  Physical Exam: BP 120/75  Pulse 53  Ht 6' (1.829 m)  Wt 221 lb (100.245 kg)  BMI 29.97 kg/m2 General: Overweight white male but in no distress Neck: Normal carotid upstroke with no carotid bruits. No thyromegaly nonnodular thyroid. JVP is 5-6 cm Lungs: Clear breath sounds bilaterally without wheezing. Cardiac: Regular rate and rhythm with normal S1-S2. No murmur rubs or gallops Vascular: No edema. Normal distal pulses bilaterally Skin: Warm and dry Physcologic: Normal affect.  12lead ECG: Sinus bradycardia otherwise normal EKG Limited bedside ECHO:N/A No images are attached to the encounter.   Assessment and Plan  HYPERLIPIDEMIA-MIXED Lipid panel followed by his primary care physician. The patient is on pitavistatin per Dr. [name] who gave him samples. We will leave further management to her.  ESSENTIAL HYPERTENSION, BENIGN Blood pressure is well controlled and from this perspective no medication adjustments are needed.  CAD, ARTERY BYPASS GRAFT Patient is very active and reports no chest pain or significant shortness of breath. He was concerned regarding an episode of what he thought was a myocardial infarction a month ago. As outlined above however this clearly was related to abdominal bloating after he drank but light. There is no evidence of a new myocardial infarction by his current EKG. The patient however is due for an echocardiogram because of financial reasons he wants to delay this. I haven't scheduled for an echocardiogram one week before his next clinic visit in 6 months.  SMOKER Again I counseled the patient about this extensively he states that both him and his wife smoke which makes it harder to stop. Also he uses tobacco for his anxiety and depression. Of note is that the patient also has not been  screened for abdominal aortic aneurysm is over age 46 and a long time smoker with coronary artery disease and we will schedule an abdominal ultrasound next visit.    Patient Active Problem List  Diagnoses  . HYPERLIPIDEMIA-MIXED  . SMOKER  . ESSENTIAL HYPERTENSION, BENIGN  . CAD, ARTERY BYPASS GRAFT  . ABDOMINAL PAIN OTHER SPECIFIED SITE Rule out AAA-multiple risk factors screening indicated

## 2012-03-09 ENCOUNTER — Other Ambulatory Visit: Payer: Self-pay | Admitting: *Deleted

## 2012-03-09 DIAGNOSIS — I2510 Atherosclerotic heart disease of native coronary artery without angina pectoris: Secondary | ICD-10-CM

## 2012-05-09 DIAGNOSIS — A4102 Sepsis due to Methicillin resistant Staphylococcus aureus: Secondary | ICD-10-CM

## 2012-05-09 DIAGNOSIS — R7881 Bacteremia: Secondary | ICD-10-CM

## 2012-05-10 DIAGNOSIS — R55 Syncope and collapse: Secondary | ICD-10-CM

## 2012-05-11 ENCOUNTER — Encounter: Payer: Self-pay | Admitting: Internal Medicine

## 2012-05-12 ENCOUNTER — Ambulatory Visit (HOSPITAL_COMMUNITY)
Admission: RE | Admit: 2012-05-12 | Discharge: 2012-05-12 | Disposition: A | Discharge status: Home / Self Care | Payer: Medicare Other | Source: Ambulatory Visit | Attending: Geriatric Medicine | Admitting: Geriatric Medicine

## 2012-05-12 ENCOUNTER — Ambulatory Visit (HOSPITAL_COMMUNITY)
Admission: RE | Admit: 2012-05-12 | Discharge: 2012-05-12 | Disposition: A | Discharge status: Home / Self Care | Payer: Medicare Other | Source: Ambulatory Visit

## 2012-05-12 DIAGNOSIS — A4902 Methicillin resistant Staphylococcus aureus infection, unspecified site: Secondary | ICD-10-CM | POA: Insufficient documentation

## 2012-05-12 NOTE — Progress Notes (Signed)
PICC line insertion for MRSA/Sepsis in blood and urine per Dr Kathlene November in [place]. Right arm basilic 45cm. Tolerated well.

## 2012-05-20 ENCOUNTER — Other Ambulatory Visit: Payer: Medicare Other

## 2012-05-25 ENCOUNTER — Ambulatory Visit: Payer: Medicare Other | Admitting: Cardiology

## 2012-06-04 ENCOUNTER — Ambulatory Visit (INDEPENDENT_AMBULATORY_CARE_PROVIDER_SITE_OTHER): Payer: Medicare Other | Admitting: Cardiology

## 2012-06-04 ENCOUNTER — Encounter: Payer: Self-pay | Admitting: Cardiology

## 2012-06-04 VITALS — BP 139/82 | HR 73 | Ht 71.0 in | Wt 228.0 lb

## 2012-06-04 DIAGNOSIS — I2510 Atherosclerotic heart disease of native coronary artery without angina pectoris: Secondary | ICD-10-CM

## 2012-06-04 DIAGNOSIS — E785 Hyperlipidemia, unspecified: Secondary | ICD-10-CM

## 2012-06-04 DIAGNOSIS — I10 Essential (primary) hypertension: Secondary | ICD-10-CM

## 2012-06-04 DIAGNOSIS — F17200 Nicotine dependence, unspecified, uncomplicated: Secondary | ICD-10-CM

## 2012-06-04 NOTE — Assessment & Plan Note (Signed)
Smoking cessation has been discussed over time. He has not been able to quit. 

## 2012-06-04 NOTE — Assessment & Plan Note (Signed)
No change in antihypertensives. Keep followup with Dr. [name].

## 2012-06-04 NOTE — Patient Instructions (Addendum)

## 2012-06-04 NOTE — Assessment & Plan Note (Signed)
Continue on Livalo and keep followup with Dr. [name] for repeat lab work.

## 2012-06-04 NOTE — Progress Notes (Signed)
Clinical Summary Jack [name] is a 66 y.o.male presenting for followup. He is a former patient of Jack [name] last seen in April of this year. He reports no angina symptoms. States he was hospitalized at [name] a month ago with bacteremia, was treated with outpatient antibiotics via PICC line. Treatment is now complete, and he is feeling back to baseline.  Recent followup echocardiogram in September revealed mild LVH with LVEF 55-60%, basal inferior hypokinesis, diastolic dysfunction, mild left atrial enlargement, mild mitral regurgitation, aortic valve sclerosis, mild tricuspid regurgitation, RVSP 29 mm mercury. I reviewed this with him today.  He reports compliance with his medications. States he has had followup with Jack [name] for lipids. 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   No Known Allergies  Current Outpatient Prescriptions  Medication Sig Dispense Refill  . ALPRAZolam (XANAX) 1 MG tablet Take 1 mg by mouth daily as needed.       Marland Kitchen aspirin 325 MG tablet Take 325 mg by mouth daily.        . carvedilol (COREG) 6.25 MG tablet Take 6.25 mg by mouth every other day.       . celecoxib (CELEBREX) 200 MG capsule Take 200 mg by mouth every other day.       . cholecalciferol (VITAMIN D) 1000 UNITS tablet Take 1,000 Units by mouth daily.        . cyanocobalamin (,VITAMIN B-12,) 1000 MCG/ML injection Inject 1,000 mcg into the muscle. Every six weeks.      . DULoxetine (CYMBALTA) 30 MG capsule Take 30 mg by mouth every other day.       . furosemide (LASIX) 20 MG tablet Take 20 mg by mouth daily.       Marland Kitchen lisinopril (PRINIVIL,ZESTRIL) 10 MG tablet Take 10 mg by mouth every other day.      . oxyCODONE-acetaminophen (PERCOCET) 10-325 MG per tablet Take 1 tablet by mouth Every 4 hours as needed.      . Pitavastatin Calcium (LIVALO) 4 MG TABS Take 1 tablet by mouth every other day.      . testosterone cypionate  (DEPOTESTOTERONE CYPIONATE) 200 MG/ML injection Inject 200 mg into the muscle every 28 (twenty-eight) days.      . traZODone (DESYREL) 100 MG tablet Take 50 mg by mouth at bedtime.         Past Medical History  Diagnosis Date  . Coronary atherosclerosis of native coronary artery     Multivessel status post CABG  . HLD (hyperlipidemia)   . Tobacco user     Past Surgical History  Procedure Date  . Foot surgery   . Appendectomy   . Knee arthroplasty   . Coronary artery bypass graft 2008    LIMA to LAD, SVG to ramus, SVG to PDA and PLA  . Rotator cuff repair   . Shoulder surgery     Social History [name] reports that he has been smoking Cigarettes.  He has a 52 pack-year smoking history. He has never used smokeless tobacco. [name] reports that he drinks alcohol.  Review of Systems No palpitations, bleeding problems, change in appetite. Prior to his diagnosis of bacteremia, he states he was having problems with fatigue and dizziness. Otherwise negative.  Physical Examination Filed Vitals:   06/04/12 1027  BP: 139/82  Pulse: 73   Filed Weights   06/04/12 1027  Weight: 228 lb (103.42 kg)  Patient in no acute distress. HEENT: Conjunctiva and lids normal, oropharynx clear Neck: Supple, no elevated JVP, no thyromegaly. Lungs: Clear to auscultation, nonlabored breathing at rest. Cardiac: Regular rate and rhythm, no S3, soft systolic murmur, no pericardial rub. Abdomen: Soft, nontender, bowel sounds present. Extremities: No pitting edema, distal pulses 2+. Skin: Warm and dry. Musculoskeletal: No kyphosis. Neuropsychiatric: Alert and oriented x3, affect grossly appropriate.    Problem List and Plan   Coronary atherosclerosis of native coronary artery Symptomatically stable on medical therapy. Recent followup echocardiogram reviewed showing normal LV function, no progressive valvular abnormalities. We will continue observation for now. He prefers annual  followup.  ESSENTIAL HYPERTENSION, BENIGN No change in antihypertensives. Keep followup with Jack [name].  SMOKER Smoking cessation has been discussed over time. He has not been able to quit.  HYPERLIPIDEMIA-MIXED Continue on Livalo and keep followup with [facility] [name] for repeat lab work.    Jack [name], M.D., F.A.C.C.

## 2012-06-04 NOTE — Assessment & Plan Note (Signed)
Symptomatically stable on medical therapy. Recent followup echocardiogram reviewed showing normal LV function, no progressive valvular abnormalities. We will continue observation for now. He prefers annual followup.

## 2013-06-29 ENCOUNTER — Ambulatory Visit (INDEPENDENT_AMBULATORY_CARE_PROVIDER_SITE_OTHER): Payer: Medicare HMO | Admitting: Cardiology

## 2013-06-29 ENCOUNTER — Encounter: Payer: Self-pay | Admitting: Cardiology

## 2013-06-29 VITALS — BP 120/70 | HR 55 | Ht 70.0 in | Wt 254.0 lb

## 2013-06-29 DIAGNOSIS — Z87891 Personal history of nicotine dependence: Secondary | ICD-10-CM

## 2013-06-29 DIAGNOSIS — I10 Essential (primary) hypertension: Secondary | ICD-10-CM

## 2013-06-29 DIAGNOSIS — F17201 Nicotine dependence, unspecified, in remission: Secondary | ICD-10-CM

## 2013-06-29 DIAGNOSIS — I2510 Atherosclerotic heart disease of native coronary artery without angina pectoris: Secondary | ICD-10-CM

## 2013-06-29 DIAGNOSIS — E785 Hyperlipidemia, unspecified: Secondary | ICD-10-CM

## 2013-06-29 NOTE — Assessment & Plan Note (Signed)
Blood pressure is normal today. 

## 2013-06-29 NOTE — Assessment & Plan Note (Signed)
Symptomatically stable, followup ECG reviewed. Continue medical therapy and observation.

## 2013-06-29 NOTE — Patient Instructions (Signed)

## 2013-06-29 NOTE — Assessment & Plan Note (Signed)
Followed by Dr. [name], last LDL 110.

## 2013-06-29 NOTE — Assessment & Plan Note (Signed)
Patient has quit smoking, no return as yet.

## 2013-06-29 NOTE — Progress Notes (Signed)
Clinical Summary Jack [name] is a 68 y.o.male last seen in October 2013. He maintains regular followup with Dr. [name]. From a cardiac perspective, denies any significant angina symptoms, has not needed nitroglycerin. He tries to stay active with ADLs. No other regular exercise regimen other than walking.  Lab work in May of this year showed LDL 110, HDL 43, triglycerides 80, cholesterol 169, BUN 21, creatinine 1.1, potassium 4.6.  Followup echocardiogram in September 2013 revealed mild LVH with LVEF 55-60%, basal inferior hypokinesis, diastolic dysfunction, mild left atrial enlargement, mild mitral regurgitation, aortic valve sclerosis, mild tricuspid regurgitation, RVSP 29 mm mercury.    No Known Allergies  Current Outpatient Prescriptions  Medication Sig Dispense Refill  . ALPRAZolam (XANAX) 1 MG tablet Take 1 mg by mouth daily as needed.       Marland Kitchen aspirin 325 MG tablet Take 325 mg by mouth daily.        . carvedilol (COREG) 6.25 MG tablet Take 6.25 mg by mouth every other day.       . celecoxib (CELEBREX) 200 MG capsule Take 200 mg by mouth daily.       . cholecalciferol (VITAMIN D) 1000 UNITS tablet Take 1,000 Units by mouth daily.        . cyanocobalamin 100 MCG tablet Take 100 mcg by mouth daily.      . DULoxetine (CYMBALTA) 30 MG capsule Take 30 mg by mouth daily.       Marland Kitchen lisinopril (PRINIVIL,ZESTRIL) 10 MG tablet Take 10 mg by mouth daily.       Marland Kitchen oxyCODONE-acetaminophen (PERCOCET) 10-325 MG per tablet Take 1 tablet by mouth Every 4 hours as needed.      . Pitavastatin Calcium (LIVALO) 4 MG TABS Take 1 tablet by mouth daily.       . traZODone (DESYREL) 100 MG tablet Take 50 mg by mouth at bedtime.        No current facility-administered medications for this visit.    Past Medical History  Diagnosis Date  . Coronary atherosclerosis of native coronary artery     Multivessel status post CABG  . HLD (hyperlipidemia)   . Tobacco user     Past Surgical History  Procedure  Laterality Date  . Foot surgery    . Appendectomy    . Knee arthroplasty    . Coronary artery bypass graft  2008    LIMA to LAD, SVG to ramus, SVG to PDA and PLA  . Rotator cuff repair    . Shoulder surgery      Social History Jack [name] reports that he quit smoking about 6 months ago. His smoking use included Cigarettes. He has a 52 pack-year smoking history. He has never used smokeless tobacco. Jack [name] reports that he drinks alcohol.  Review of Systems No palpitations or syncope. No claudication. Arthritic pains. No orthopnea or PND. Otherwise negative.  Physical Examination Filed Vitals:   06/29/13 1040  BP: 120/70  Pulse: 55   Filed Weights   06/29/13 1040  Weight: 254 lb (115.214 kg)    Patient in no acute distress.  HEENT: Conjunctiva and lids normal, oropharynx clear  Neck: Supple, no elevated JVP, no thyromegaly.  Lungs: Clear to auscultation, nonlabored breathing at rest.  Cardiac: Regular rate and rhythm, no S3, soft systolic murmur, no pericardial rub.  Abdomen: Soft, nontender, bowel sounds present.  Extremities: No pitting edema, distal pulses 2+.  Skin: Warm and dry.  Musculoskeletal: No kyphosis.  Neuropsychiatric: Alert and  oriented x3, affect grossly appropriate.   Problem List and Plan   Coronary atherosclerosis of native coronary artery Symptomatically stable, followup ECG reviewed. Continue medical therapy and observation.  Essential hypertension, benign Blood pressure is normal today.  HYPERLIPIDEMIA-MIXED Followed by Dr. [name], last LDL 110.  Tobacco abuse, in remission Patient has quit smoking, no return as yet.    Jonelle Sidle, M.D., F.A.C.C.

## 2013-07-08 ENCOUNTER — Ambulatory Visit: Payer: Medicare Other | Admitting: Cardiology

## 2014-01-25 ENCOUNTER — Ambulatory Visit (INDEPENDENT_AMBULATORY_CARE_PROVIDER_SITE_OTHER): Payer: Medicare HMO | Admitting: Cardiology

## 2014-01-25 ENCOUNTER — Encounter: Payer: Self-pay | Admitting: Cardiology

## 2014-01-25 VITALS — BP 138/81 | HR 58 | Ht 71.0 in | Wt 251.8 lb

## 2014-01-25 DIAGNOSIS — I10 Essential (primary) hypertension: Secondary | ICD-10-CM

## 2014-01-25 DIAGNOSIS — I2510 Atherosclerotic heart disease of native coronary artery without angina pectoris: Secondary | ICD-10-CM

## 2014-01-25 DIAGNOSIS — E785 Hyperlipidemia, unspecified: Secondary | ICD-10-CM

## 2014-01-25 NOTE — Progress Notes (Signed)
Clinical Summary Jack [name] is a 68 y.o.male last seen in November 2014. Record review finds hospitalization at [facility] in early May with acute renal insufficiency and bacteremia, Staphylococcus aureus. He was managed on the hospitalist team with antibiotics, suspicion of possible urinary tract although this was not well defined. No clear evidence of ACS on review of troponin I levels. ECG showed sinus rhythm and PACs with nonspecific T-wave changes, possible old inferior infarct pattern.  Recent lab work showed BUN 21, creatinine 1.1, potassium 5.2, normal AST, ALT 61, hemoglobin 14.8, platelets 230.  Followup echocardiogram in September 2013 revealed mild LVH with LVEF 55-60%, basal inferior hypokinesis, diastolic dysfunction, mild left atrial enlargement, mild mitral regurgitation, aortic valve sclerosis, mild tricuspid regurgitation, RVSP 29 mm mercury.    No Known Allergies  Current Outpatient Prescriptions  Medication Sig Dispense Refill  . ALPRAZolam (XANAX) 1 MG tablet Take 1 mg by mouth daily as needed.       Marland Kitchen aspirin 325 MG tablet Take 325 mg by mouth daily.        . carvedilol (COREG) 6.25 MG tablet Take 6.25 mg by mouth every other day.       . celecoxib (CELEBREX) 200 MG capsule Take 200 mg by mouth daily.       . cholecalciferol (VITAMIN D) 1000 UNITS tablet Take 1,000 Units by mouth daily.        . Cyanocobalamin (VITAMIN B-12 IJ) Inject as directed. Monthly      . cyanocobalamin 100 MCG tablet Take 100 mcg by mouth every other day.       . DULoxetine (CYMBALTA) 30 MG capsule Take 30 mg by mouth daily.       Marland Kitchen lisinopril (PRINIVIL,ZESTRIL) 10 MG tablet Take 10 mg by mouth daily.       Marland Kitchen oxyCODONE-acetaminophen (PERCOCET) 10-325 MG per tablet Take 1 tablet by mouth Every 4 hours as needed.      . rosuvastatin (CRESTOR) 10 MG tablet Take 10 mg by mouth daily.      . TESTOSTERONE IM Inject into the muscle. Monthly      . traZODone (DESYREL) 100 MG tablet Take 50 mg by  mouth at bedtime.        No current facility-administered medications for this visit.    Past Medical History  Diagnosis Date  . Coronary atherosclerosis of native coronary artery     Multivessel status post CABG  . HLD (hyperlipidemia)   . Tobacco user     Past Surgical History  Procedure Laterality Date  . Foot surgery    . Appendectomy    . Knee arthroplasty    . Coronary artery bypass graft  2008    LIMA to LAD, SVG to ramus, SVG to PDA and PLA  . Rotator cuff repair    . Shoulder surgery      Social History Jack [name] reports that he quit smoking about 13 months ago. His smoking use included Cigarettes. He has a 52 pack-year smoking history. He has never used smokeless tobacco. Jack [name] reports that he drinks alcohol.  Review of Systems No fevers. Somewhat weak however. No chest pain or palpitations. Otherwise as outlined.  Physical Examination Filed Vitals:   01/25/14 1419  BP: 138/81  Pulse: 58   Filed Weights   01/25/14 1419  Weight: 251 lb 12.8 oz (114.216 kg)    Overweight male, appears comfortable at rest. HEENT: Conjunctiva and lids normal, oropharynx clear  Neck: Supple, no elevated JVP,  no thyromegaly.  Lungs: Clear to auscultation, nonlabored breathing at rest.  Cardiac: Regular rate and rhythm, no S3, soft systolic murmur, no pericardial rub.  Abdomen: Soft, nontender, bowel sounds present.  Extremities: No pitting edema, distal pulses 2+.  Skin: Warm and dry.  Musculoskeletal: No kyphosis.  Neuropsychiatric: Alert and oriented x3, affect grossly appropriate.   Problem List and Plan   Coronary atherosclerosis of native coronary artery No active angina symptoms with history of previous bypass surgery in 2008. Recent ECGs reviewed. For now would continue medical therapy and observation.  Essential hypertension, benign No change to current regimen, keep followup with [name].  HYPERLIPIDEMIA-MIXED Continues on Crestor.    Satira Sark, M.D., F.A.C.C.

## 2014-01-25 NOTE — Assessment & Plan Note (Signed)
No change to current regimen, keep followup with [name].

## 2014-01-25 NOTE — Patient Instructions (Signed)

## 2014-01-25 NOTE — Assessment & Plan Note (Signed)
No active angina symptoms with history of previous bypass surgery in 2008. Recent ECGs reviewed. For now would continue medical therapy and observation.

## 2014-01-25 NOTE — Assessment & Plan Note (Signed)
Continues on Crestor.

## 2015-01-25 ENCOUNTER — Encounter: Payer: Self-pay | Admitting: *Deleted

## 2015-01-26 ENCOUNTER — Encounter: Payer: Self-pay | Admitting: Cardiology

## 2015-01-26 ENCOUNTER — Encounter: Payer: Self-pay | Admitting: *Deleted

## 2015-01-26 ENCOUNTER — Telehealth: Payer: Self-pay | Admitting: Cardiology

## 2015-01-26 ENCOUNTER — Ambulatory Visit (INDEPENDENT_AMBULATORY_CARE_PROVIDER_SITE_OTHER): Payer: Medicare HMO | Admitting: Cardiology

## 2015-01-26 VITALS — BP 144/90 | HR 61 | Ht 71.0 in | Wt 268.0 lb

## 2015-01-26 DIAGNOSIS — Z951 Presence of aortocoronary bypass graft: Secondary | ICD-10-CM | POA: Diagnosis not present

## 2015-01-26 DIAGNOSIS — E782 Mixed hyperlipidemia: Secondary | ICD-10-CM

## 2015-01-26 DIAGNOSIS — I2510 Atherosclerotic heart disease of native coronary artery without angina pectoris: Secondary | ICD-10-CM | POA: Diagnosis not present

## 2015-01-26 DIAGNOSIS — I10 Essential (primary) hypertension: Secondary | ICD-10-CM

## 2015-01-26 DIAGNOSIS — R0602 Shortness of breath: Secondary | ICD-10-CM | POA: Diagnosis not present

## 2015-01-26 NOTE — Patient Instructions (Signed)
Your physician recommends that you continue on your current medications as directed. Please refer to the Current Medication list given to you today. Your physician has requested that you have a lexiscan myoview. For further information please visit [email or web address]. Please follow instruction sheet, as given. Your physician recommends that you schedule a follow-up appointment in: 1 year. You will receive a reminder letter in the mail in about 10 months reminding you to call and schedule your appointment. If you don't receive this letter, please contact our office.

## 2015-01-26 NOTE — Telephone Encounter (Signed)
Lexiscan Cardiolite on medications dx: CAD & S/P CABG & SOB June 14th arrive at 8:45 for 11:00

## 2015-01-26 NOTE — Progress Notes (Signed)
Cardiology Office Note  Date: 01/26/2015   ID: ROC STREETT, DOB 17-Mar-1946, MRN [id number]  PCP: Octavio Graves, DO  Primary Cardiologist: Rozann Lesches, MD   Chief Complaint  Patient presents with  . Coronary Artery Disease  . Hyperlipidemia    History of Present Illness: Jack [name] is a 69 y.o. male last seen in June 2015. He presents for a routine follow-up visit. He has not had any distinct angina symptoms, some shortness of breath with activity however. He tells me that he spent several hours in the yard yesterday cutting down trees using a chainsaw.  We reviewed his medications which are outlined below. He continues on Crestor and has had good control of his lipids.  He has not had ischemic testing within the last 5 years. ECG shows sinus bradycardia with nonspecific ST-T changes.  This summer will be his 69th wedding anniversary, he plans to go to [facility] with his wife. She has had advancing dementia and also thyroid cancer recently.   Past Medical History  Diagnosis Date  . Coronary atherosclerosis of native coronary artery     Multivessel status post CABG  . HLD (hyperlipidemia)   . Tobacco user     Past Surgical History  Procedure Laterality Date  . Foot surgery    . Appendectomy    . Knee arthroplasty    . Coronary artery bypass graft  2008    LIMA to LAD, SVG to ramus, SVG to PDA and PLA  . Rotator cuff repair    . Shoulder surgery      Current Outpatient Prescriptions  Medication Sig Dispense Refill  . ALPRAZolam (XANAX) 1 MG tablet Take 1 mg by mouth daily as needed.     Marland Kitchen aspirin 325 MG tablet Take 325 mg by mouth daily.      . carvedilol (COREG) 6.25 MG tablet Take 6.25 mg by mouth every other day.     . celecoxib (CELEBREX) 200 MG capsule Take 200 mg by mouth daily.     . cholecalciferol (VITAMIN D) 1000 UNITS tablet Take 1,000 Units by mouth daily.      . Cyanocobalamin (VITAMIN B-12 IJ) Inject as directed. Monthly    .  cyanocobalamin 100 MCG tablet Take 100 mcg by mouth every other day.     . DULoxetine (CYMBALTA) 30 MG capsule Take 30 mg by mouth daily.     Marland Kitchen lisinopril (PRINIVIL,ZESTRIL) 10 MG tablet Take 10 mg by mouth daily.     Marland Kitchen oxyCODONE-acetaminophen (PERCOCET) 10-325 MG per tablet Take 1 tablet by mouth Every 4 hours as needed.    . rosuvastatin (CRESTOR) 10 MG tablet Take 10 mg by mouth daily.    . TESTOSTERONE IM Inject into the muscle. Monthly    . traZODone (DESYREL) 100 MG tablet Take 50 mg by mouth at bedtime.      No current facility-administered medications for this visit.    Allergies:  Review of patient's allergies indicates no known allergies.   Social History: The patient  reports that he has been smoking Cigarettes.  He has a 52 pack-year smoking history. He has never used smokeless tobacco. He reports that he drinks alcohol.    ROS:  Please see the history of present illness. Otherwise, complete review of systems is positive for arthritic pains.  All other systems are reviewed and negative.   Physical Exam: VS:  BP 144/90 mmHg  Pulse 61  Ht 5\' 11"  (1.803 m)  Wt 268  lb (121.564 kg)  BMI 37.39 kg/m2  SpO2 95%, BMI Body mass index is 37.39 kg/(m^2).  Wt Readings from Last 3 Encounters:  01/26/15 268 lb (121.564 kg)  01/25/14 251 lb 12.8 oz (114.216 kg)  06/29/13 254 lb (115.214 kg)     Overweight male, appears comfortable at rest. HEENT: Conjunctiva and lids normal, oropharynx clear  Neck: Supple, no elevated JVP, no thyromegaly.  Lungs: Prolonged expiratory phase, nonlabored breathing at rest.  Cardiac: Regular rate and rhythm, no S3, soft systolic murmur, no pericardial rub.  Abdomen: Soft, nontender, bowel sounds present.  Extremities: No pitting edema, distal pulses 2+.  Skin: Warm and dry.  Musculoskeletal: No kyphosis.  Neuropsychiatric: Alert and oriented x3, affect grossly appropriate.   ECG: ECG is ordered today and shows sinus bradycardia with  nonspecific ST-T changes.   Recent Labwork:  12/25/2014: Hemoglobin 17.6, platelets 151, AST 17, ALT 15, BUN 18, creatinine 1.1, potassium 4.0, cholesterol 112, HDL 31, LDL 57, triglycerides 122  Other Studies Reviewed Today:  Followup echocardiogram in September 2013 revealed mild LVH with LVEF 55-60%, basal inferior hypokinesis, diastolic dysfunction, mild left atrial enlargement, mild mitral regurgitation, aortic valve sclerosis, mild tricuspid regurgitation, RVSP 29 mm mercury.   Assessment and Plan:  1. CAD status post CABG in 2008. He does not report any angina, although does have intermittent exertional shortness of breath. He has not undergone ischemic testing within the last 5 years, and we will arrange a Lexiscan Cardiolite on medical therapy to reassess ischemic burden.  2. Hyperlipidemia, well controlled on Crestor with recent LDL 57.  3. Long-standing history of tobacco abuse, has not been able to quit.  Current medicines were reviewed with the patient today.   Orders Placed This Encounter  Procedures  . NM Myocar Multi W/Spect W/Wall Motion / EF  . EKG 12-Lead    Disposition: FU with me in 1 year.   Signed, Satira Sark, MD, [facility] 01/26/2015 10:01 AM    [facility] [address], [place], [place] [address] Phone: [phone]; Fax: [phone]

## 2015-01-31 NOTE — Telephone Encounter (Signed)
[id number]

## 2015-01-31 NOTE — Telephone Encounter (Signed)
HUMANA QQUI#[id number] VALID 6-14 TO 03-07-15 CH (Disregard last entry - pasting error)

## 2015-02-05 NOTE — Telephone Encounter (Signed)
° °  °  Expand All Collapse All   Lexiscan Cardiolite on medications dx: CAD & S/P CABG & SOB  June 14th arrive at 8:45 for 11:00   Reschedule for March 21, 2015

## 2015-02-06 ENCOUNTER — Encounter (HOSPITAL_COMMUNITY): Payer: Medicare HMO

## 2015-02-06 ENCOUNTER — Inpatient Hospital Stay (HOSPITAL_COMMUNITY): Admission: RE | Admit: 2015-02-06 | Payer: Medicare HMO | Source: Ambulatory Visit

## 2015-02-08 ENCOUNTER — Emergency Department (HOSPITAL_COMMUNITY)
Admission: EM | Admit: 2015-02-08 | Discharge: 2015-02-08 | Disposition: A | Discharge status: Home / Self Care | Payer: Medicare HMO | Attending: Emergency Medicine | Admitting: Emergency Medicine

## 2015-02-08 ENCOUNTER — Emergency Department (HOSPITAL_COMMUNITY): Payer: Medicare HMO

## 2015-02-08 ENCOUNTER — Encounter (HOSPITAL_COMMUNITY): Payer: Self-pay | Admitting: *Deleted

## 2015-02-08 DIAGNOSIS — Z791 Long term (current) use of non-steroidal anti-inflammatories (NSAID): Secondary | ICD-10-CM | POA: Insufficient documentation

## 2015-02-08 DIAGNOSIS — I2510 Atherosclerotic heart disease of native coronary artery without angina pectoris: Secondary | ICD-10-CM | POA: Diagnosis not present

## 2015-02-08 DIAGNOSIS — Y998 Other external cause status: Secondary | ICD-10-CM | POA: Diagnosis not present

## 2015-02-08 DIAGNOSIS — W109XXA Fall (on) (from) unspecified stairs and steps, initial encounter: Secondary | ICD-10-CM | POA: Diagnosis not present

## 2015-02-08 DIAGNOSIS — I10 Essential (primary) hypertension: Secondary | ICD-10-CM | POA: Diagnosis not present

## 2015-02-08 DIAGNOSIS — Z7982 Long term (current) use of aspirin: Secondary | ICD-10-CM | POA: Insufficient documentation

## 2015-02-08 DIAGNOSIS — S60911A Unspecified superficial injury of right wrist, initial encounter: Secondary | ICD-10-CM | POA: Diagnosis present

## 2015-02-08 DIAGNOSIS — S52511A Displaced fracture of right radial styloid process, initial encounter for closed fracture: Secondary | ICD-10-CM | POA: Insufficient documentation

## 2015-02-08 DIAGNOSIS — Y9301 Activity, walking, marching and hiking: Secondary | ICD-10-CM | POA: Insufficient documentation

## 2015-02-08 DIAGNOSIS — Z720 Tobacco use: Secondary | ICD-10-CM | POA: Diagnosis not present

## 2015-02-08 DIAGNOSIS — Z951 Presence of aortocoronary bypass graft: Secondary | ICD-10-CM | POA: Diagnosis not present

## 2015-02-08 DIAGNOSIS — Y9289 Other specified places as the place of occurrence of the external cause: Secondary | ICD-10-CM | POA: Insufficient documentation

## 2015-02-08 DIAGNOSIS — S62001A Unspecified fracture of navicular [scaphoid] bone of right wrist, initial encounter for closed fracture: Secondary | ICD-10-CM

## 2015-02-08 DIAGNOSIS — Z79899 Other long term (current) drug therapy: Secondary | ICD-10-CM | POA: Diagnosis not present

## 2015-02-08 DIAGNOSIS — S62011A Displaced fracture of distal pole of navicular [scaphoid] bone of right wrist, initial encounter for closed fracture: Secondary | ICD-10-CM | POA: Diagnosis not present

## 2015-02-08 DIAGNOSIS — E785 Hyperlipidemia, unspecified: Secondary | ICD-10-CM | POA: Insufficient documentation

## 2015-02-08 DIAGNOSIS — T1490XA Injury, unspecified, initial encounter: Secondary | ICD-10-CM

## 2015-02-08 HISTORY — DX: Essential (primary) hypertension: I10

## 2015-02-08 MED ORDER — OXYCODONE-ACETAMINOPHEN 5-325 MG PO TABS
1.0000 | ORAL_TABLET | Freq: Once | ORAL | Status: AC
  Administered 2015-02-08: 1 via ORAL
  Filled 2015-02-08: qty 1

## 2015-02-08 MED ORDER — IBUPROFEN 800 MG PO TABS: 800.0000 mg | ORAL_TABLET | Freq: Three times a day (TID) | ORAL | Status: DC

## 2015-02-08 NOTE — Discharge Instructions (Signed)
Please call your doctor for a followup appointment within 24-48 hours. When you talk to your doctor please let them know that you were seen in the emergency department and have them acquire all of your records so that they can discuss the findings with you and formulate a treatment plan to fully care for your new and ongoing problems. ° °

## 2015-02-08 NOTE — ED Notes (Signed)
MD at bedside. 

## 2015-02-08 NOTE — ED Provider Notes (Signed)
CSN: [id number]     Arrival date & time 02/08/15  1037 History   This chart was scribed for Noemi Chapel, MD by Hansel Feinstein, ED Scribe. This patient was seen in room APA18/APA18 and the patient's care was started at 11:24 AM.     Chief Complaint  Patient presents with  . Fall   The history is provided by the patient. No language interpreter was used.    HPI Comments: Jack [name] is a 69 y.o. male who presents to the Emergency Department complaining of multiple falls onset 2 weeks ago. Pt states that the first fall occurred on 01/27/15 when he was walking to the woods when he tripped on a vine while carrying a chainsaw. He fell and got his left leg twisted underneath him. Associated left knee pain was relieved with cold compress. Pt states that he fell again 4 days later while he was using crutches going up the stairs and fell onto his back on the sidewalk. He also twisted his left ankle and is no longer to bear weight on it. He states associated moderate, persistent right wrist pain, lower back pain, left knee pain, and left ankle pain. Pt is currently taking Coreg, Aspirin, and Lisinopril daily. He reports that he is compliant with his medications. He denies LOC and head injury.   Past Medical History  Diagnosis Date  . Coronary atherosclerosis of native coronary artery     Multivessel status post CABG  . HLD (hyperlipidemia)   . Tobacco user   . Hypertension    Past Surgical History  Procedure Laterality Date  . Foot surgery    . Appendectomy    . Knee arthroplasty    . Coronary artery bypass graft  2008    LIMA to LAD, SVG to ramus, SVG to PDA and PLA  . Rotator cuff repair    . Shoulder surgery    . Cholecystectomy     Family History  Problem Relation Age of Onset  . Coronary artery disease Neg Hx   . Diabetes Neg Hx   . Hypertension Neg Hx    History  Substance Use Topics  . Smoking status: Current Every Day Smoker -- 1.00 packs/day for 52 years    Types: Cigarettes  .  Smokeless tobacco: Never Used  . Alcohol Use: 0.0 oz/week    0 Standard drinks or equivalent per week    Review of Systems  Cardiovascular: Positive for leg swelling.  Musculoskeletal: Positive for arthralgias.  Skin: Positive for color change.  All other systems reviewed and are negative.  Allergies  Review of patient's allergies indicates no known allergies.  Home Medications   Prior to Admission medications   Medication Sig Start Date End Date Taking? Authorizing Provider  ALPRAZolam Duanne Moron) 1 MG tablet Take 1 mg by mouth 4 (four) times daily.    Yes Historical Provider, MD  amLODipine (NORVASC) 5 MG tablet Take 5 mg by mouth daily. 01/27/15  Yes Historical Provider, MD  aspirin 325 MG tablet Take 325 mg by mouth daily.     Yes Historical Provider, MD  carvedilol (COREG) 6.25 MG tablet Take 12.5 mg by mouth daily.    Yes Historical Provider, MD  celecoxib (CELEBREX) 200 MG capsule Take 200 mg by mouth 2 (two) times daily.    Yes Historical Provider, MD  cholecalciferol (VITAMIN D) 1000 UNITS tablet Take 1,000 Units by mouth daily.     Yes Historical Provider, MD  Cyanocobalamin (VITAMIN B-12 IJ) Inject as  directed. Every 3 Months.   Yes Historical Provider, MD  cyanocobalamin 100 MCG tablet Take 100 mcg by mouth every other day.    Yes Historical Provider, MD  lisinopril (PRINIVIL,ZESTRIL) 10 MG tablet Take 10 mg by mouth daily.    Yes Historical Provider, MD  oxyCODONE-acetaminophen (PERCOCET) 10-325 MG per tablet Take 1 tablet by mouth Every 4 hours as needed for pain.  11/20/11  Yes Historical Provider, MD  rosuvastatin (CRESTOR) 20 MG tablet Take 20 mg by mouth daily.   Yes Historical Provider, MD  TESTOSTERONE IM Inject into the muscle. Monthly   Yes Historical Provider, MD  traZODone (DESYREL) 100 MG tablet Take 100 mg by mouth at bedtime.  05/15/12  Yes Historical Provider, MD  ibuprofen (ADVIL,MOTRIN) 800 MG tablet Take 1 tablet (800 mg total) by mouth 3 (three) times daily.  02/08/15   Noemi Chapel, MD   Triage VS: BP 151/88 mmHg  Pulse 63  Temp(Src) 98.1 F (36.7 C) (Oral)  Resp 18  Ht 5\' 11"  (1.803 m)  Wt 270 lb (122.471 kg)  BMI 37.67 kg/m2  SpO2 98% Physical Exam  Constitutional: He appears well-developed and well-nourished.  HENT:  Head: Normocephalic and atraumatic.  Eyes: Conjunctivae are normal. Right eye exhibits no discharge. Left eye exhibits no discharge.  Pulmonary/Chest: Effort normal. No respiratory distress.  Musculoskeletal:  Preserved ability to straight leg raise on the left. Compartments are soft. Knee joint on the left is supple, mild effusion present Pain with eversion of the left ankle and tenderness over the medial mal on the left. Tenderness in the right snuff box.  Neurological: He is alert. Coordination normal.  Strength is normal in all 4 extremities, sensation is normal, able to straight leg raise bilaterally  Skin: Skin is warm and dry. No rash noted. He is not diaphoretic. No erythema.  Psychiatric: He has a normal mood and affect.  Nursing note and vitals reviewed.   ED Course  Procedures (including critical care time) DIAGNOSTIC STUDIES: Oxygen Saturation is 98% on RA, normal by my interpretation.    COORDINATION OF CARE: 11:30 AM Discussed treatment plan with pt at bedside and pt agreed to plan.   Labs Review Labs Reviewed - No data to display  Imaging Review Dg Lumbar Spine Complete  02/08/2015   CLINICAL DATA:  Initial encounter for fall on 01/27/2015. Fall again on 01/31/2015.  EXAM: LUMBAR SPINE - COMPLETE 4+ VIEW  COMPARISON:  None.  FINDINGS: Five lumbar type vertebral bodies. Convex left lumbar spine curvature. Left abdominal calcification projects in the region of the lower pole left kidney. Cholecystectomy clips. Sacroiliac joints are symmetric. Aortic atherosclerosis. Maintenance of vertebral body height and alignment. Advanced lumbosacral spondylosis, with loss of intervertebral disc height and facet  arthropathy at multiple levels.  IMPRESSION: Advanced lumbosacral spondylosis, without acute osseous finding.  Atherosclerosis.  Possible left nephrolithiasis.   Electronically Signed   By: Abigail Miyamoto M.D.   On: 02/08/2015 12:41   Dg Wrist Complete Right  02/08/2015   ADDENDUM REPORT: 02/08/2015 12:50  ADDENDUM: There is a stable small metallic foreign body in the volar aspect of the hand.   Electronically Signed   By: Lowella Grip III M.D.   On: 02/08/2015 12:50   02/08/2015   CLINICAL DATA:  Patient tripped and fell twice in past 12 days  EXAM: RIGHT WRIST - COMPLETE 3+ VIEW  COMPARISON:  May 03, 2014  FINDINGS: Frontal, oblique, lateral, and ulnar deviation scaphoid images were obtained. There is  a nondisplaced fracture of the radial styloid. There is residua of old trauma involving the distal scaphoid with remodeling in this area. No acute fracture is seen in this area. There is no other evidence of fracture. No dislocation. There is osteoarthritic change in the lateral radiocarpal joint. There is also osteoarthritic change in the scaphotrapezial, saddle, and first MCP and IP joints.  IMPRESSION: Nondisplaced fracture of the radial styloid which is apparently acute. Remodeling of the distal scaphoid in an area of prior fracture. Healing in this area does not appear complete, although there is moderate callus in this area. There are areas of osteoarthritic change. No dislocations.  Electronically Signed: By: Lowella Grip III M.D. On: 02/08/2015 12:45   Dg Ankle Complete Left  02/08/2015   CLINICAL DATA:  Initial encounter for multiple falls.  Pain.  EXAM: LEFT ANKLE COMPLETE - 3+ VIEW  COMPARISON:  None.  FINDINGS: Bimalleolar soft tissue swelling is mild to moderate. Tibiotalar osteoarthritis is mild. Small calcaneal spur. No acute fracture or dislocation.  IMPRESSION: Soft tissue swelling and degenerative change, without acute osseous finding.   Electronically Signed   By: Abigail Miyamoto  M.D.   On: 02/08/2015 12:47   Dg Knee Complete 4 Views Left  02/08/2015   CLINICAL DATA:  Initial encounter for repeated falls.  Pain.  EXAM: LEFT KNEE - COMPLETE 4+ VIEW  COMPARISON:  None.  FINDINGS: Moderate medial and patellofemoral, mild lateral compartment joint space narrowing osteophyte formation. Extensive vascular calcifications. Surgical changes medial to the distal femur. Suspect a moderate suprapatellar joint effusion; evaluation degraded by patient body habitus. No acute fracture or dislocation.  IMPRESSION: Advanced osteoarthritis.  Suprapatellar joint effusion.  Atherosclerosis.   Electronically Signed   By: Abigail Miyamoto M.D.   On: 02/08/2015 12:43     MDM   Final diagnoses:  Trauma  Radial styloid fracture, right, closed, initial encounter  Scaphoid fracture of wrist, right, closed, initial encounter    The patient definitely has a fracture of the distal forearm as seen on x-ray. I personally viewed and interpreted the x-ray and find her to be a distal radial styloid fracture. There is also a possible scaphoid fracture, the placement was placed in a thumb spica splint, encouraged to follow-up with orthopedics. His ankle does not appear to be fractured, I have encouraged him to be nonweightbearing, splint for the ankle has been offered, he has crutches, he desires discharge. His vital signs look unremarkable, he has been informed of all of his x-ray findings and has been given the phone number for orthopedics for follow-up. He already takes oxycodone 10 mg tablets. We'll add anti-inflammatory. Encouraged Rice therapy  Meds given in ED:  Medications  oxyCODONE-acetaminophen (PERCOCET/ROXICET) 5-325 MG per tablet 1 tablet (1 tablet Oral Given 02/08/15 1140)    New Prescriptions   IBUPROFEN (ADVIL,MOTRIN) 800 MG TABLET    Take 1 tablet (800 mg total) by mouth 3 (three) times daily.    I personally performed the services described in this documentation, which was scribed in my  presence. The recorded information has been reviewed and is accurate.    Noemi Chapel, MD 02/08/15 1324

## 2015-02-08 NOTE — ED Notes (Signed)
Pt states he tripped on a vine in the woods on 6/4, twisting left knee. He then feel again 6/8, falling backward from the 3rd step of a porch, stating he lost his balance at the time. Pain to both ankles, left knee, and right wrist. NAD at this time.

## 2015-02-13 ENCOUNTER — Encounter: Payer: Self-pay | Admitting: Orthopedic Surgery

## 2015-02-13 ENCOUNTER — Ambulatory Visit (INDEPENDENT_AMBULATORY_CARE_PROVIDER_SITE_OTHER): Payer: Medicare HMO | Admitting: Orthopedic Surgery

## 2015-02-13 VITALS — BP 106/69 | Ht 71.0 in | Wt 270.0 lb

## 2015-02-13 DIAGNOSIS — S93402A Sprain of unspecified ligament of left ankle, initial encounter: Secondary | ICD-10-CM | POA: Diagnosis not present

## 2015-02-13 DIAGNOSIS — S52501A Unspecified fracture of the lower end of right radius, initial encounter for closed fracture: Secondary | ICD-10-CM | POA: Diagnosis not present

## 2015-02-13 NOTE — Progress Notes (Signed)
Patient ID: Jack [name], male   DOB: Mar 10, 1946, 69 y.o.   MRN: [id number] ER follow-up  New complaints  Chief Complaint  Patient presents with  . Follow-up    er follow up Right wrist fx, DOI 01/31/15    History the patient fell off the stairs backwards at his home injured his right wrist and left ankle  X-ray show right radial styloid fracture and negative x-rays left ankle he was treated for sprain with ASO brace and crutches has a splint on his right wrist  Complains of pain and swelling in both areas but he is able to ambulate and weight-bear.  Review of systems he has some warmth and redness around the left ankle but there is no skin area  He has some swelling in both joints  Views no pertinent positive review of systems  BP 106/69 mmHg  Ht 5\' 11"  (1.803 m)  Wt 270 lb (122.471 kg)  BMI 37.67 kg/m2  He is ambulating with a set of crutches and an ASO brace on the left ankle  Inspection left ankle tenderness and swelling in the anterior talofibular ligament and lateral ankle joint line. He has painful range of motion but is within normal limits. He has a stable ankle and the drawer test  His good motor exam around the ankle joint and the skin is red but intact he has normal sensation good pulse  Right wrist is tender at the radial styloid he has normal passive range of motion minimal swelling wrist is stable motor exam is normal he's neurovascularly intact and there is no laceration over the fracture site  Plan dependent interpretation of his x-rays include 3 views left ankle no fracture Interpretation of wrist x-ray small radial styloid fracture old scaphoid fracture and injury  Continue weight-bear as tolerated continue splinting 4 weeks right wrist. Continue ASO brace 4 weeks left ankle.   Follow-up 4 weeks

## 2015-03-06 ENCOUNTER — Ambulatory Visit (INDEPENDENT_AMBULATORY_CARE_PROVIDER_SITE_OTHER): Payer: Medicare HMO | Admitting: Orthopedic Surgery

## 2015-03-06 VITALS — BP 125/60 | Ht 71.0 in | Wt 270.0 lb

## 2015-03-06 DIAGNOSIS — S93402A Sprain of unspecified ligament of left ankle, initial encounter: Secondary | ICD-10-CM | POA: Diagnosis not present

## 2015-03-06 DIAGNOSIS — S52501A Unspecified fracture of the lower end of right radius, initial encounter for closed fracture: Secondary | ICD-10-CM

## 2015-03-06 NOTE — Progress Notes (Signed)
Patient ID: RUBIN DAIS, male   DOB: [birth year], 69 y.o.   MRN: [id number] Patient ID: SHALOM MCGUINESS, male   DOB: 24-Jul-1946, 69 y.o.   MRN: [id number]  Follow up visit  Chief Complaint  Patient presents with  . Follow-up    3 week recheck on left ankle and right wrist fracture, DOI 01-31-15.    BP 125/60 mmHg  Ht 5\' 11"  (1.803 m)  Wt 270 lb (122.471 kg)  BMI 37.67 kg/m2  Encounter Diagnoses  Name Primary?  . Fracture of distal end of radius, right, closed, initial encounter Yes  . Ankle sprain, left, initial encounter     The patient had 2 injuries he had a nondisplaced radial styloid fracture on the right and left ankle sprain he's done well over the last few weeks he has no major complaints other than some Achilles tendon discomfort when he's walking  His review of systems is negative today. Related to the musculoskeletal system.  He has full range of motion of his wrist and hand but no tenderness or swelling. His left ankle shows a trace positive anterior drawer with a firm endpoint no pain on inversion negative Achilles tendon rupture test i.e. normal Thompson test. Does have some chronic edema in the foot. The wrist and ankle are normal in terms of vascular function and sensation  He is discharged

## 2015-03-06 NOTE — Patient Instructions (Signed)
Okay to remove braces

## 2015-03-21 ENCOUNTER — Encounter (HOSPITAL_COMMUNITY): Payer: Self-pay

## 2015-03-21 ENCOUNTER — Encounter (HOSPITAL_COMMUNITY)
Admission: RE | Admit: 2015-03-21 | Discharge: 2015-03-21 | Disposition: A | Discharge status: Home / Self Care | Payer: Medicare HMO | Source: Ambulatory Visit | Attending: Cardiology | Admitting: Cardiology

## 2015-03-21 ENCOUNTER — Inpatient Hospital Stay (HOSPITAL_COMMUNITY): Admission: RE | Admit: 2015-03-21 | Payer: Medicare HMO | Source: Ambulatory Visit

## 2015-03-21 DIAGNOSIS — R0602 Shortness of breath: Secondary | ICD-10-CM | POA: Insufficient documentation

## 2015-03-21 DIAGNOSIS — I2510 Atherosclerotic heart disease of native coronary artery without angina pectoris: Secondary | ICD-10-CM | POA: Insufficient documentation

## 2015-03-21 DIAGNOSIS — I10 Essential (primary) hypertension: Secondary | ICD-10-CM | POA: Insufficient documentation

## 2015-03-21 DIAGNOSIS — Z951 Presence of aortocoronary bypass graft: Secondary | ICD-10-CM | POA: Insufficient documentation

## 2015-03-21 LAB — NM MYOCAR MULTI W/SPECT W/WALL MOTION / EF
CSEPPHR: 63 {beats}/min
Rest HR: 46 {beats}/min

## 2015-03-21 MED ORDER — SODIUM CHLORIDE 0.9 % IJ SOLN
INTRAMUSCULAR | Status: AC
  Administered 2015-03-21: 10 mL via INTRAVENOUS
  Filled 2015-03-21: qty 3

## 2015-03-21 MED ORDER — REGADENOSON 0.4 MG/5ML IV SOLN
INTRAVENOUS | Status: AC
  Administered 2015-03-21: 0.4 mg via INTRAVENOUS
  Filled 2015-03-21: qty 5

## 2015-03-21 MED ORDER — TECHNETIUM TC 99M SESTAMIBI - CARDIOLITE
30.0000 | Freq: Once | INTRAVENOUS | Status: AC | PRN
  Administered 2015-03-21: 11:00:00 30 via INTRAVENOUS

## 2015-03-21 MED ORDER — TECHNETIUM TC 99M SESTAMIBI GENERIC - CARDIOLITE
10.0000 | Freq: Once | INTRAVENOUS | Status: AC | PRN
  Administered 2015-03-21: 10 via INTRAVENOUS

## 2015-03-22 ENCOUNTER — Telehealth: Payer: Self-pay | Admitting: *Deleted

## 2015-03-22 NOTE — Telephone Encounter (Signed)
-----   Message from Satira Sark, MD sent at 03/21/2015  9:59 PM EDT ----- Reviewed report. 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 LVEF mildly reduced at 46%. In light of stable symptoms at recent office visit would recommend continued medical therapy and observation.

## 2015-03-22 NOTE — Telephone Encounter (Signed)
Notes Recorded by Laurine Blazer, LPN on 4/71/2527 at 1:29 PM Patient notified, copy to pmd.

## 2015-06-01 ENCOUNTER — Encounter (INDEPENDENT_AMBULATORY_CARE_PROVIDER_SITE_OTHER): Payer: Medicare HMO | Admitting: Ophthalmology

## 2015-06-01 DIAGNOSIS — H35033 Hypertensive retinopathy, bilateral: Secondary | ICD-10-CM

## 2015-06-01 DIAGNOSIS — H4312 Vitreous hemorrhage, left eye: Secondary | ICD-10-CM | POA: Diagnosis not present

## 2015-06-01 DIAGNOSIS — I10 Essential (primary) hypertension: Secondary | ICD-10-CM

## 2015-06-01 DIAGNOSIS — H43813 Vitreous degeneration, bilateral: Secondary | ICD-10-CM

## 2015-07-16 ENCOUNTER — Encounter (INDEPENDENT_AMBULATORY_CARE_PROVIDER_SITE_OTHER): Payer: Medicare HMO | Admitting: Ophthalmology

## 2015-07-16 DIAGNOSIS — H35033 Hypertensive retinopathy, bilateral: Secondary | ICD-10-CM | POA: Diagnosis not present

## 2015-07-16 DIAGNOSIS — H43813 Vitreous degeneration, bilateral: Secondary | ICD-10-CM | POA: Diagnosis not present

## 2015-07-16 DIAGNOSIS — I10 Essential (primary) hypertension: Secondary | ICD-10-CM

## 2015-07-16 DIAGNOSIS — H2513 Age-related nuclear cataract, bilateral: Secondary | ICD-10-CM | POA: Diagnosis not present

## 2016-01-28 ENCOUNTER — Encounter: Payer: Self-pay | Admitting: *Deleted

## 2016-01-30 ENCOUNTER — Ambulatory Visit (INDEPENDENT_AMBULATORY_CARE_PROVIDER_SITE_OTHER): Payer: Medicare HMO | Admitting: Cardiology

## 2016-01-30 ENCOUNTER — Encounter: Payer: Self-pay | Admitting: Cardiology

## 2016-01-30 VITALS — BP 120/82 | HR 100 | Ht 71.0 in | Wt 253.0 lb

## 2016-01-30 DIAGNOSIS — I2510 Atherosclerotic heart disease of native coronary artery without angina pectoris: Secondary | ICD-10-CM

## 2016-01-30 DIAGNOSIS — I10 Essential (primary) hypertension: Secondary | ICD-10-CM

## 2016-01-30 DIAGNOSIS — E782 Mixed hyperlipidemia: Secondary | ICD-10-CM | POA: Diagnosis not present

## 2016-01-30 DIAGNOSIS — Z720 Tobacco use: Secondary | ICD-10-CM | POA: Diagnosis not present

## 2016-01-30 NOTE — Progress Notes (Signed)
Cardiology Office Note  Date: 01/30/2016   ID: Jack [name], DOB November 02, 1945, MRN [id number]  PCP: Jack Graves, DO  Primary Cardiologist: Jack Lesches, MD   Chief Complaint  Patient presents with  . Coronary Artery Disease    History of Present Illness: Jack [name] is a 70 y.o. male last seen in June 2016. He presents for a routine follow-up visit. He is not reporting any angina symptoms. He is limited by back pain, uses a cane, but does remain fairly active with ADLs and other chores.  Record review finds hospitalization at [facility] in March with COPD exacerbation.  We went over his medications. He does not take them regularly. We did discuss compliance. States that the only thing he takes consistently is his pain medications.  He has a follow-up visit with Dr. Melina [name] later this month and will have lab work at that time for physical.  Past Medical History  Diagnosis Date  . Coronary atherosclerosis of native coronary artery     Multivessel status post CABG  . HLD (hyperlipidemia)   . Tobacco user   . Hypertension     Past Surgical History  Procedure Laterality Date  . Foot surgery    . Appendectomy    . Knee arthroplasty    . Coronary artery bypass graft  2008    LIMA to LAD, SVG to ramus, SVG to PDA and PLA  . Rotator cuff repair    . Shoulder surgery    . Cholecystectomy      Current Outpatient Prescriptions  Medication Sig Dispense Refill  . ALPRAZolam (XANAX) 1 MG tablet Take 1 mg by mouth 4 (four) times daily.     Marland Kitchen amLODipine (NORVASC) 5 MG tablet Take 5 mg by mouth every other day.   12  . aspirin 325 MG tablet Take 325 mg by mouth every other day.     . carvedilol (COREG) 6.25 MG tablet Take 12.5 mg by mouth every other day.     . celecoxib (CELEBREX) 200 MG capsule Take 200 mg by mouth 2 (two) times daily.     . cholecalciferol (VITAMIN D) 1000 UNITS tablet Take 1,000 Units by mouth daily.      . Cyanocobalamin (VITAMIN B-12 IJ) Inject as  directed. Every 3 Months.    . cyanocobalamin 100 MCG tablet Take 100 mcg by mouth every other day.     . ibuprofen (ADVIL,MOTRIN) 800 MG tablet Take 1 tablet (800 mg total) by mouth 3 (three) times daily. 21 tablet 0  . lisinopril (PRINIVIL,ZESTRIL) 10 MG tablet Take 10 mg by mouth every other day.     . oxyCODONE-acetaminophen (PERCOCET) 10-325 MG per tablet Take 1 tablet by mouth Every 4 hours as needed for pain.     . rosuvastatin (CRESTOR) 20 MG tablet Take 20 mg by mouth every other day.     . TESTOSTERONE IM Inject into the muscle. Monthly    . traZODone (DESYREL) 100 MG tablet Take 100 mg by mouth at bedtime.      No current facility-administered medications for this visit.   Allergies:  Review of patient's allergies indicates no known allergies.   Social History: The patient  reports that he has been smoking Cigarettes.  He has a 52 pack-year smoking history. He has never used smokeless tobacco. He reports that he drinks alcohol.    ROS:  Please see the history of present illness. Otherwise, complete review of systems is positive for .  All  other systems are reviewed and negative.   Physical Exam: VS:  BP 120/82 mmHg  Pulse 100  Ht 5\' 11"  (1.803 m)  Wt 253 lb (114.76 kg)  BMI 35.30 kg/m2  SpO2 95%, BMI Body mass index is 35.3 kg/(m^2).  Wt Readings from Last 3 Encounters:  01/30/16 253 lb (114.76 kg)  03/06/15 270 lb (122.471 kg)  02/13/15 270 lb (122.471 kg)    Overweight male, appears comfortable at rest. Using a cane. HEENT: Conjunctiva and lids normal, oropharynx clear  Neck: Supple, no elevated JVP, no thyromegaly.  Lungs: No wheezes, nonlabored breathing at rest.  Cardiac: Regular rate and rhythm, no S3, soft systolic murmur, no pericardial rub.  Abdomen: Soft, nontender, bowel sounds present.  Extremities: No pitting edema, distal pulses 2+.  Skin: Warm and dry.  Musculoskeletal: No kyphosis.  Neuropsychiatric: Alert and oriented x3, affect grossly  appropriate.  ECG: I personally reviewed the tracing from 11/06/2015 at [facility] which showed sinus tachycardia with occasional PVCs and couplets, left atrial enlargement, rule out old inferior infarct pattern.  Recent Labwork:  March 2017: BUN 13, creatinine 0.84, potassium 4.1, AST 14, ALT 15, hemoglobin 16.2, platelets 177  Other Studies Reviewed Today:  Echocardiogram September 2013: Mild LVH with LVEF 55-60%, basal inferior hypokinesis, diastolic dysfunction, mild left atrial enlargement, mild mitral regurgitation, aortic valve sclerosis, mild tricuspid regurgitation, RVSP 29 mm mercury.   Lexiscan Cardiolite 03/21/2015:  Findings consistent with prior myocardial infarction. Evidence of prior inferior infarct with no peri-infarct ischemia  This is an intermediate risk study. Intermediate risk due to mildly decreased LVEF, there is no current myocardium at jeopardy.  The left ventricular ejection fraction is mildly decreased (45-54%).  Chest x-ray 11/06/2015 [facility]): Bibasilar atelectasis with left fifth and sixth anterior nondisplaced rib fractures  Assessment and Plan:  1. Multivessel CAD status post CABG. Ischemic testing from last year's outlined above showing evidence of inferior scar and mildly reduced LVEF. I reinforced medication compliance. We will continue observation.  2. Ongoing tobacco abuse. He is not motivated to quit.  3. Hyperlipidemia, continues on Crestor. He is due for follow-up lab work with Dr. Melina [name].  4. Essential hypertension, blood pressure is well controlled today.  Current medicines were reviewed with the patient today.  Disposition: FU with me in 6 months.   Signed, Jack Sark, MD, [facility] 01/30/2016 11:31 AM    [facility] [place], [place], [place] [address] Phone: [phone]; Fax: [phone]

## 2016-01-30 NOTE — Patient Instructions (Signed)
Your physician recommends that you continue on your current medications as directed. Please refer to the Current Medication list given to you today. Your physician recommends that you schedule a follow-up appointment in: 6 months. You will receive a reminder letter in the mail in about 4 months reminding you to call and schedule your appointment. If you don't receive this letter, please contact our office. 

## 2016-03-15 IMAGING — DX DG WRIST COMPLETE 3+V*R*
4 series · 4 of 4 positions shown · non-contrast
Comparison: May 03, 2014

ADDENDUM:
There is a stable small metallic foreign body in the volar aspect of
the hand.
CLINICAL DATA: Patient tripped and fell twice in past 12 days

EXAM:
RIGHT WRIST - COMPLETE 3+ VIEW

[wrist pa]
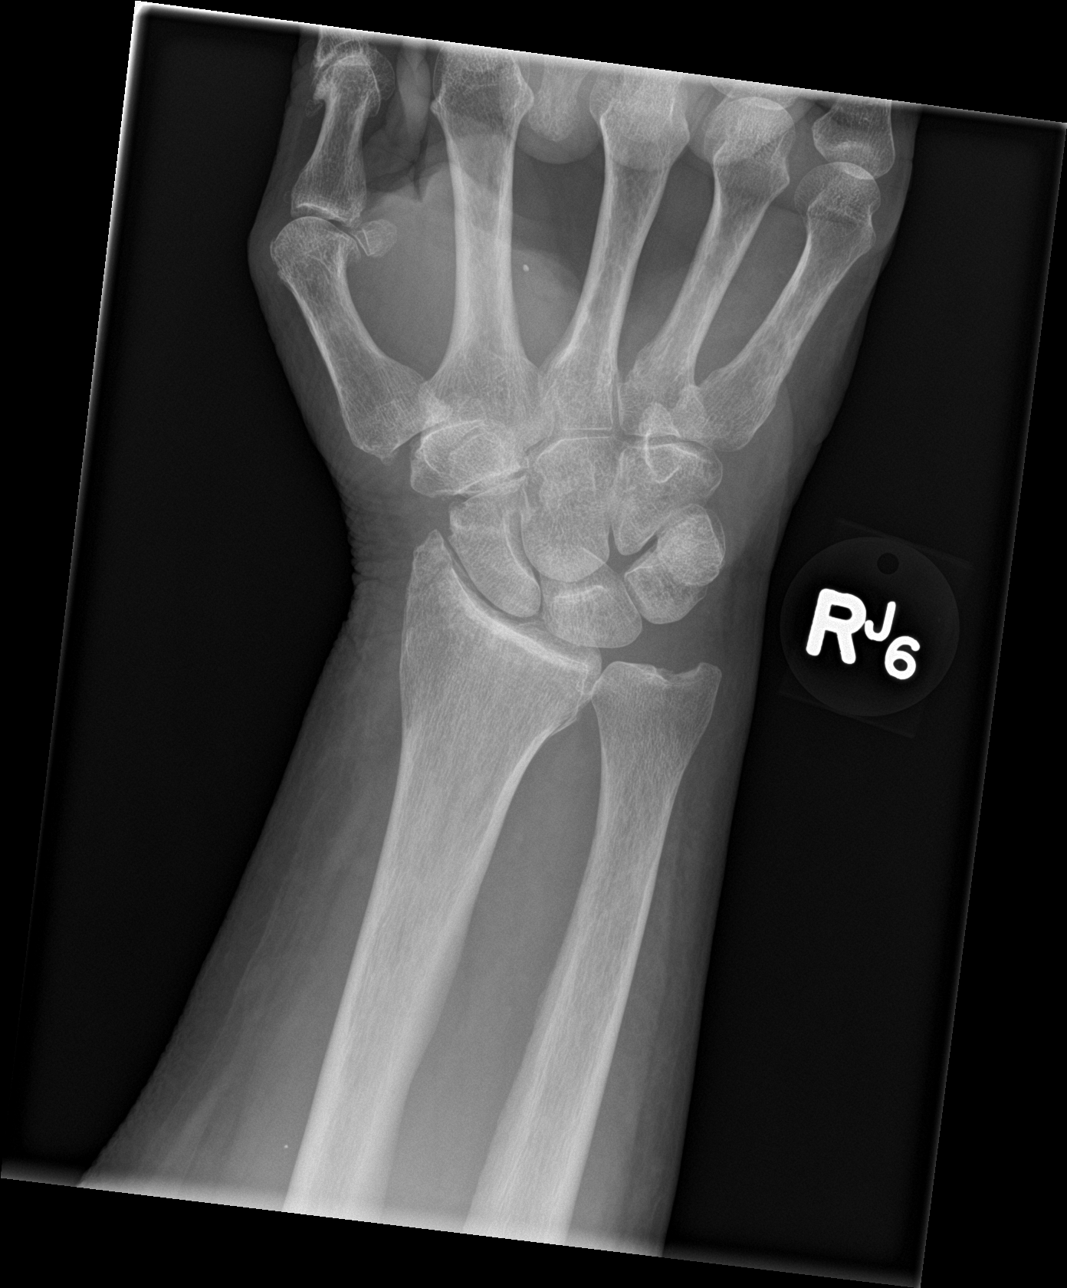

[wrist obl]
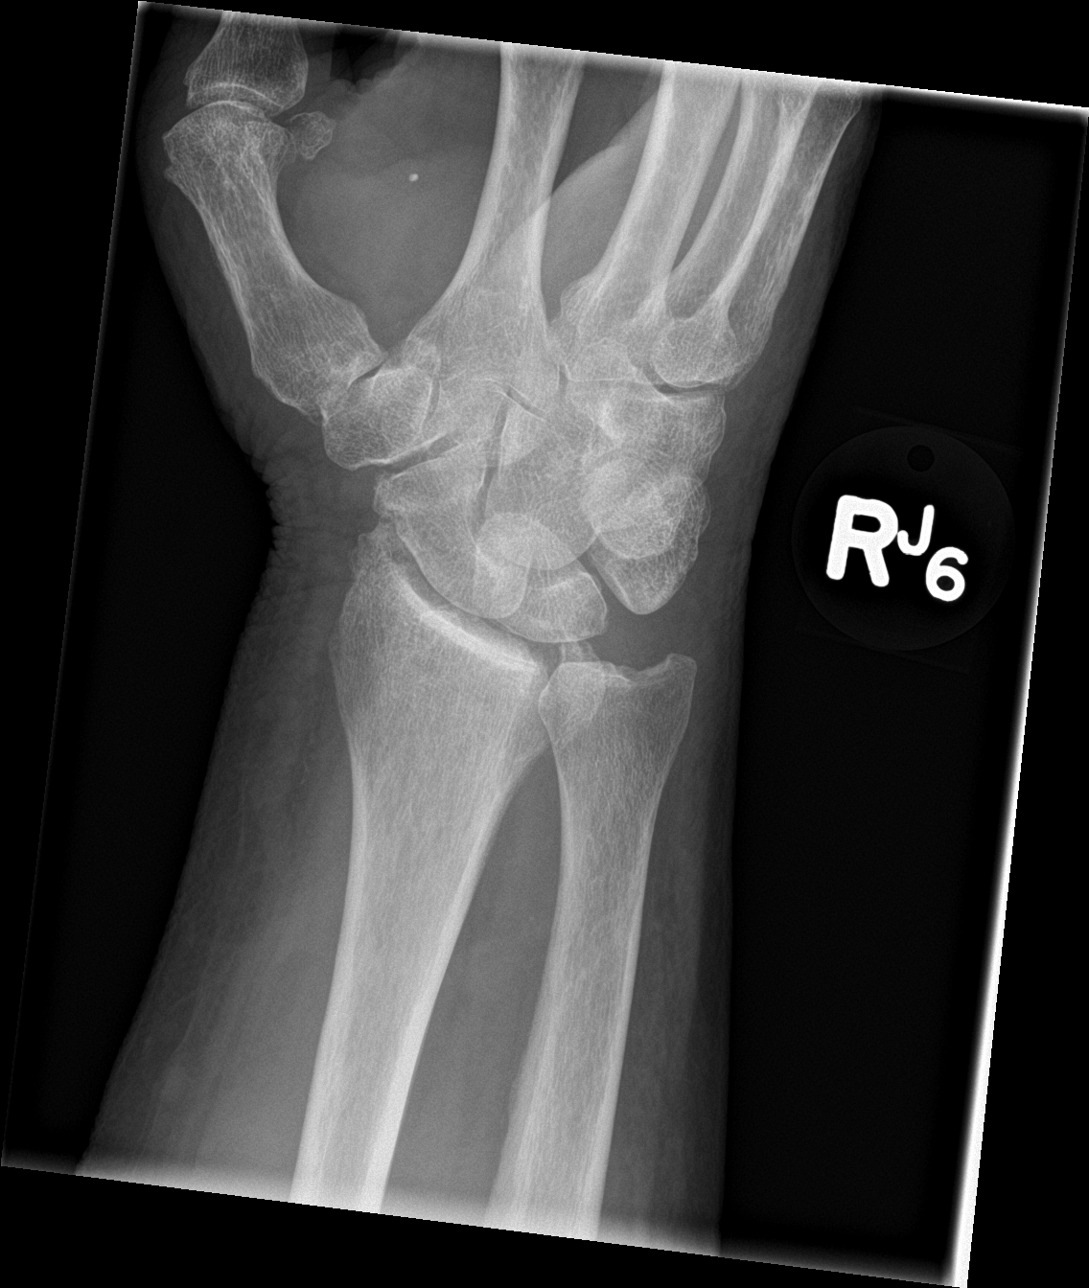

[wrist lat]
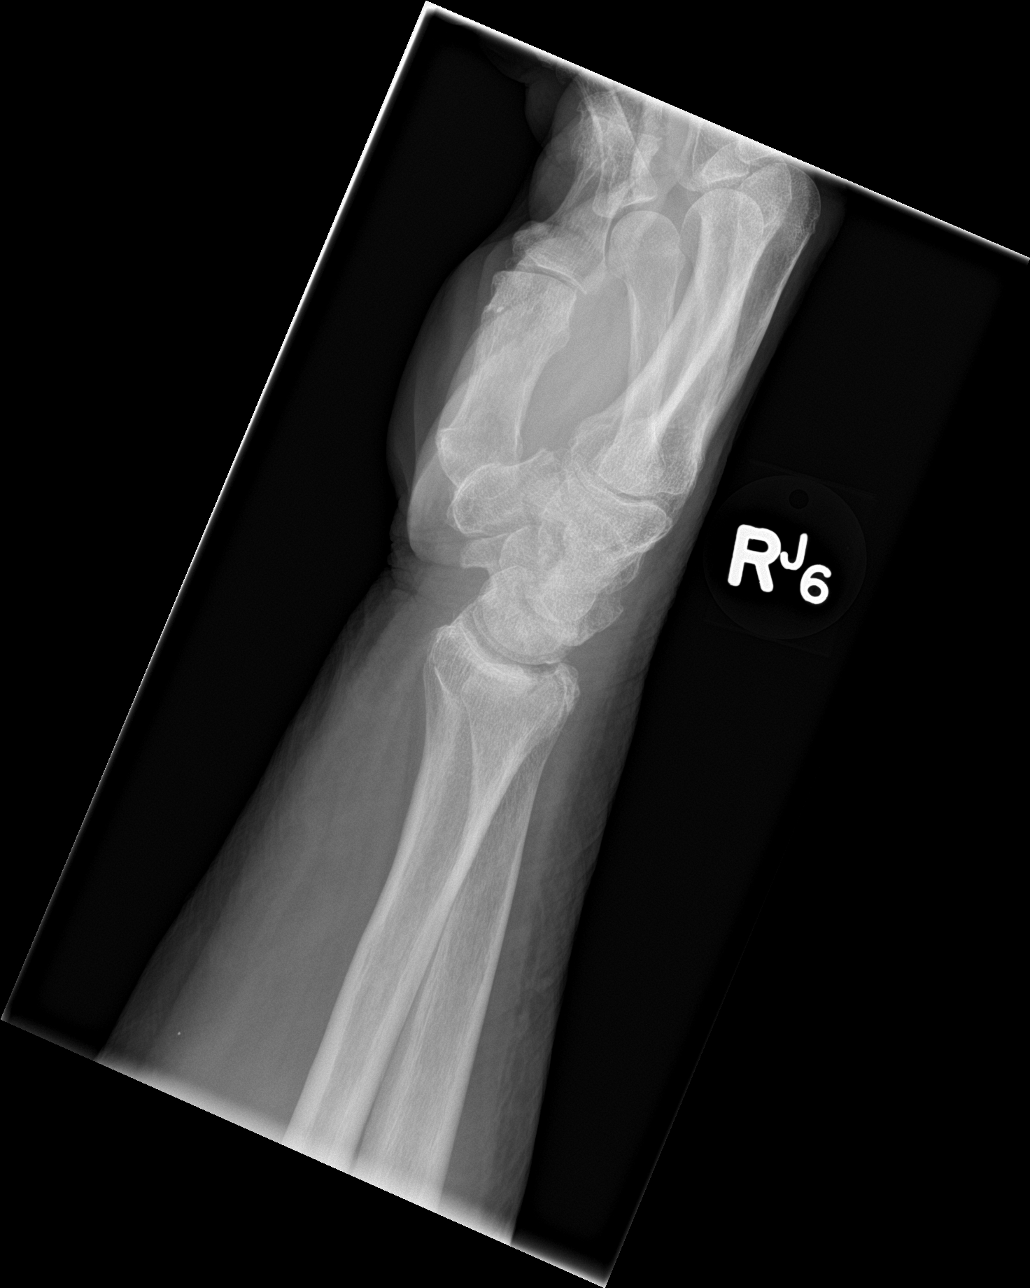

[wrist navicular]
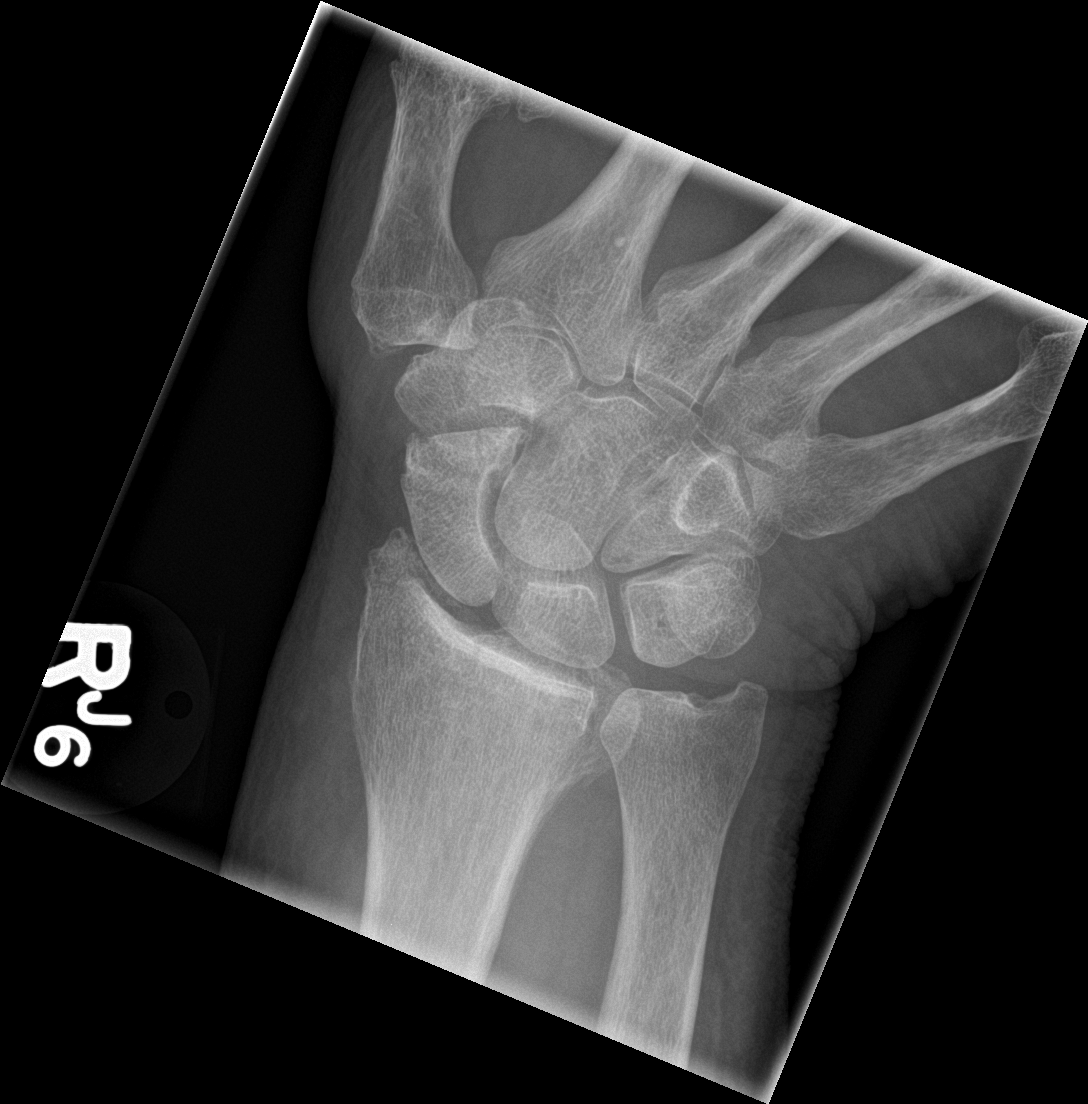

[4 of 4 positions shown; findings below may reference images not displayed]

FINDINGS: Frontal, oblique, lateral, and ulnar deviation scaphoid images were
obtained. There is a nondisplaced fracture of the radial styloid.
There is residua of old trauma involving the distal scaphoid with
remodeling in this area. No acute fracture is seen in this area.
There is no other evidence of fracture. No dislocation. There is
osteoarthritic change in the lateral radiocarpal joint. There is
also osteoarthritic change in the scaphotrapezial, saddle, and first
MCP and IP joints.
IMPRESSION: Nondisplaced fracture of the radial styloid which is apparently
acute. Remodeling of the distal scaphoid in an area of prior
fracture. Healing in this area does not appear complete, although
there is moderate callus in this area. There are areas of
osteoarthritic change. No dislocations.

## 2016-03-15 IMAGING — DX DG LUMBAR SPINE COMPLETE 4+V
5 series · 5 of 5 positions shown · non-contrast
Comparison: None.

CLINICAL DATA: Initial encounter for fall on 01/27/2015. Fall again
on 01/31/2015.

EXAM:
LUMBAR SPINE - COMPLETE 4+ VIEW

[l-spine ap]
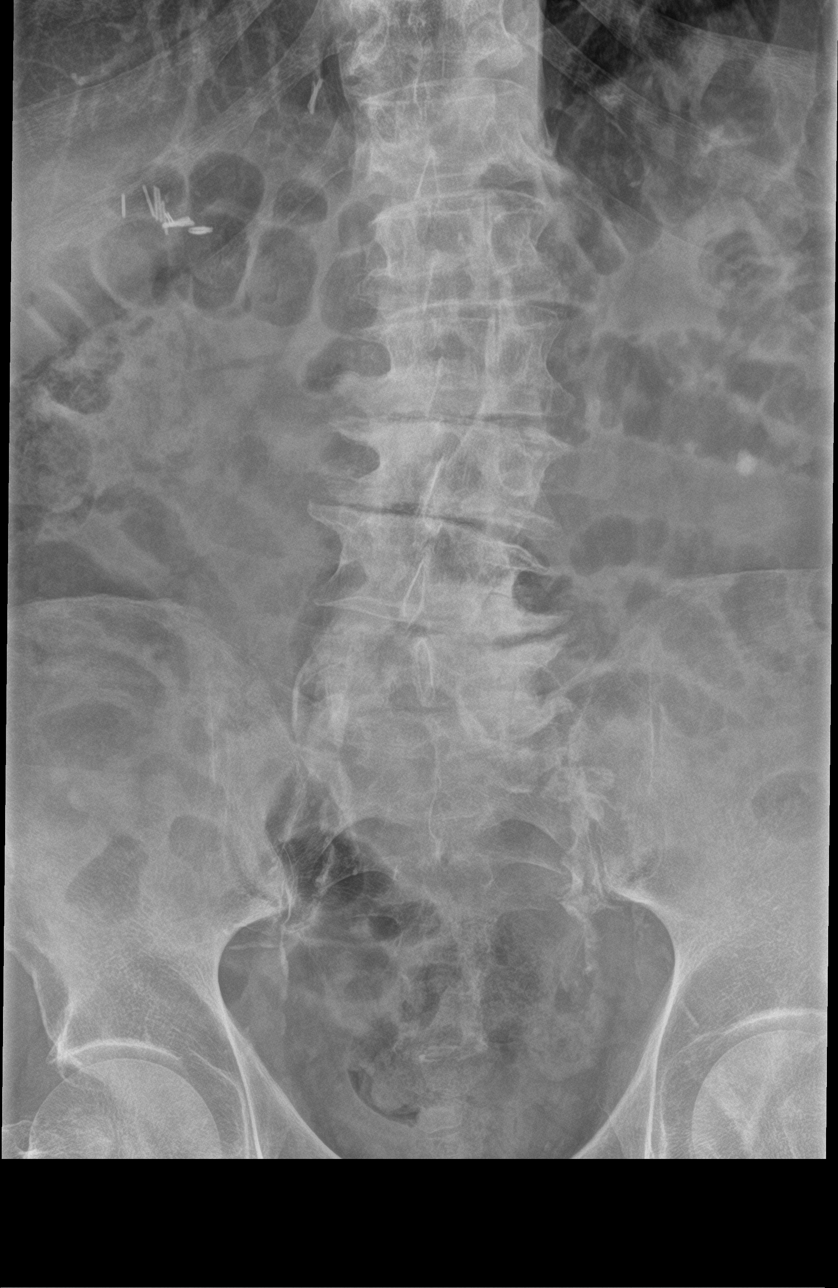

[l-spine obl (1 of 2)]
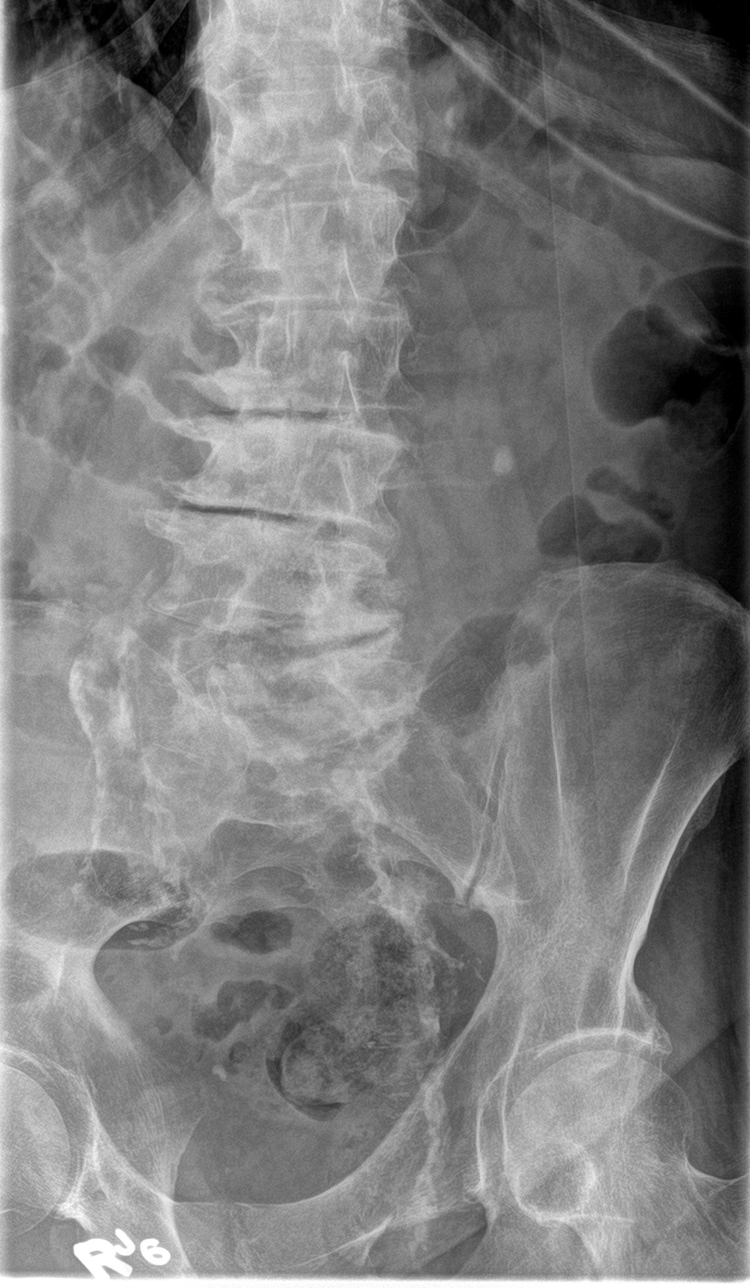

[l-spine obl (2 of 2)]
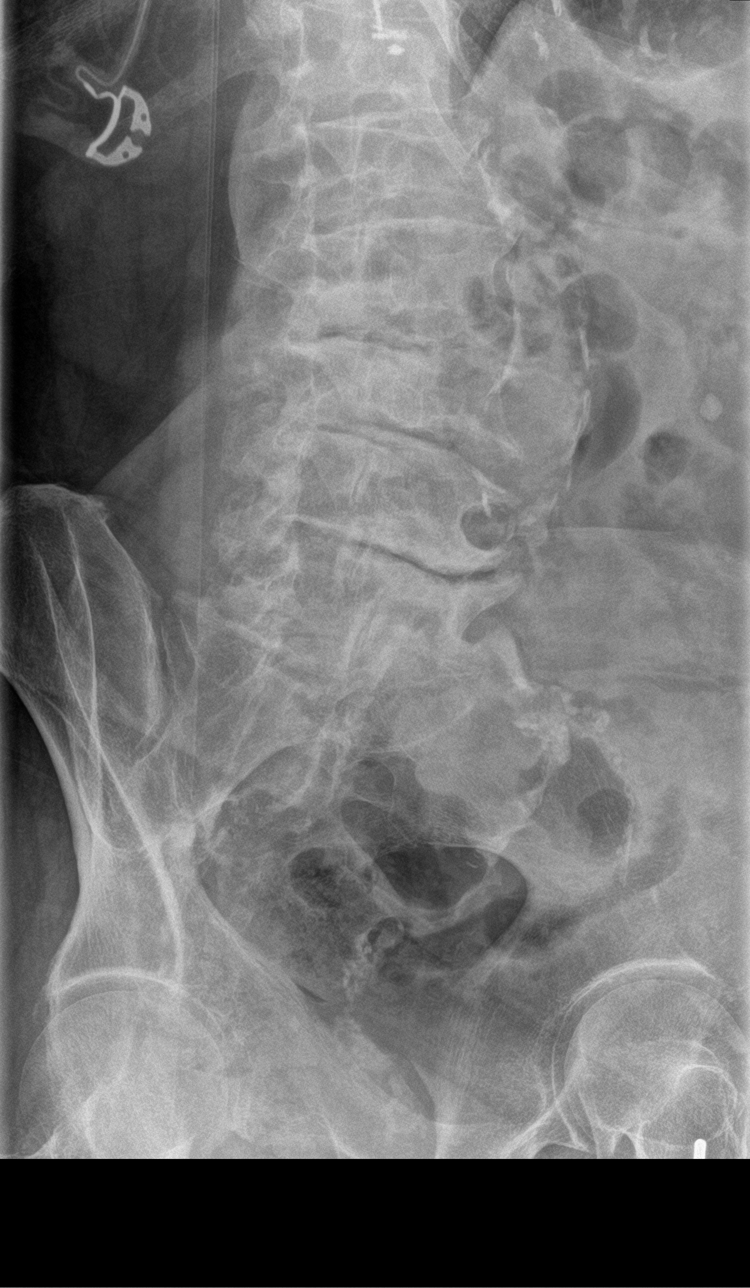

[l-spine lat]
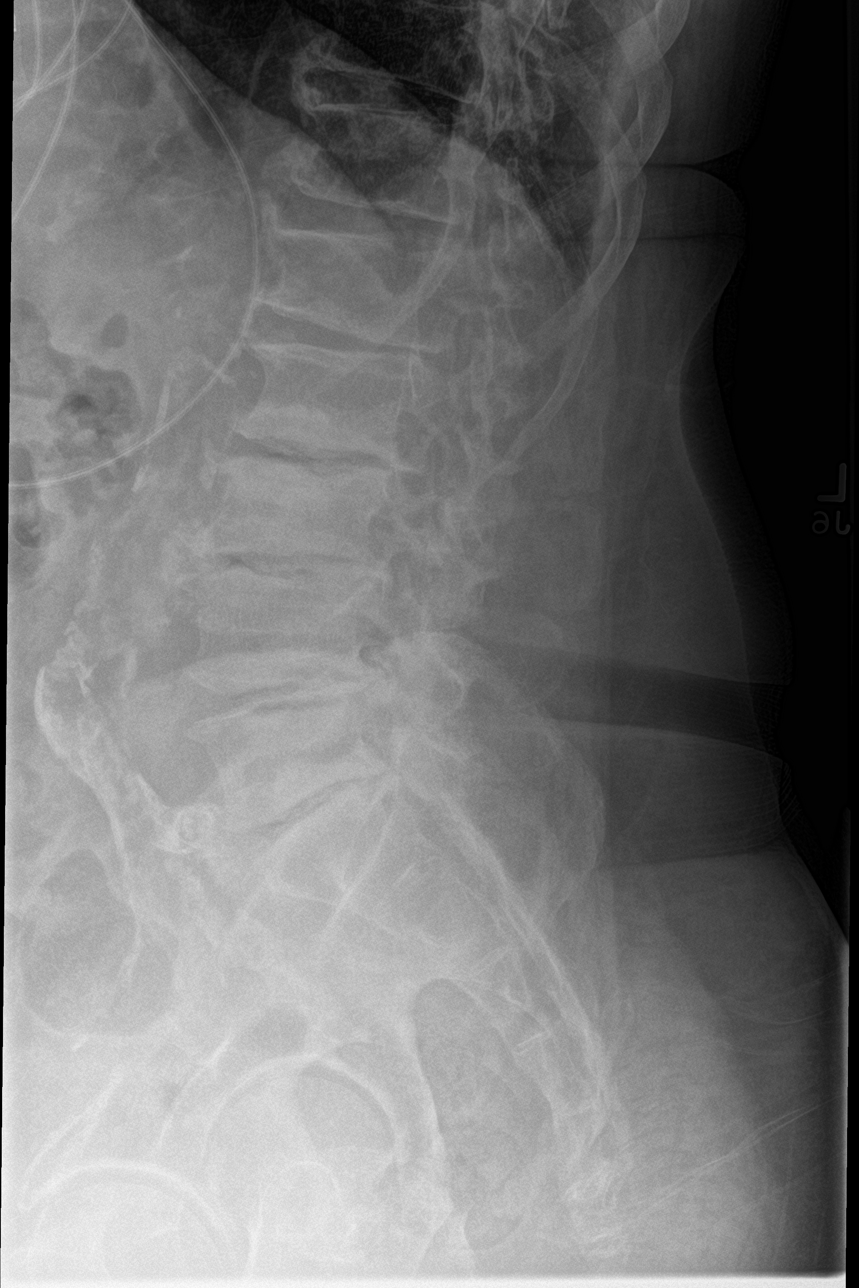

[l-spine spot]
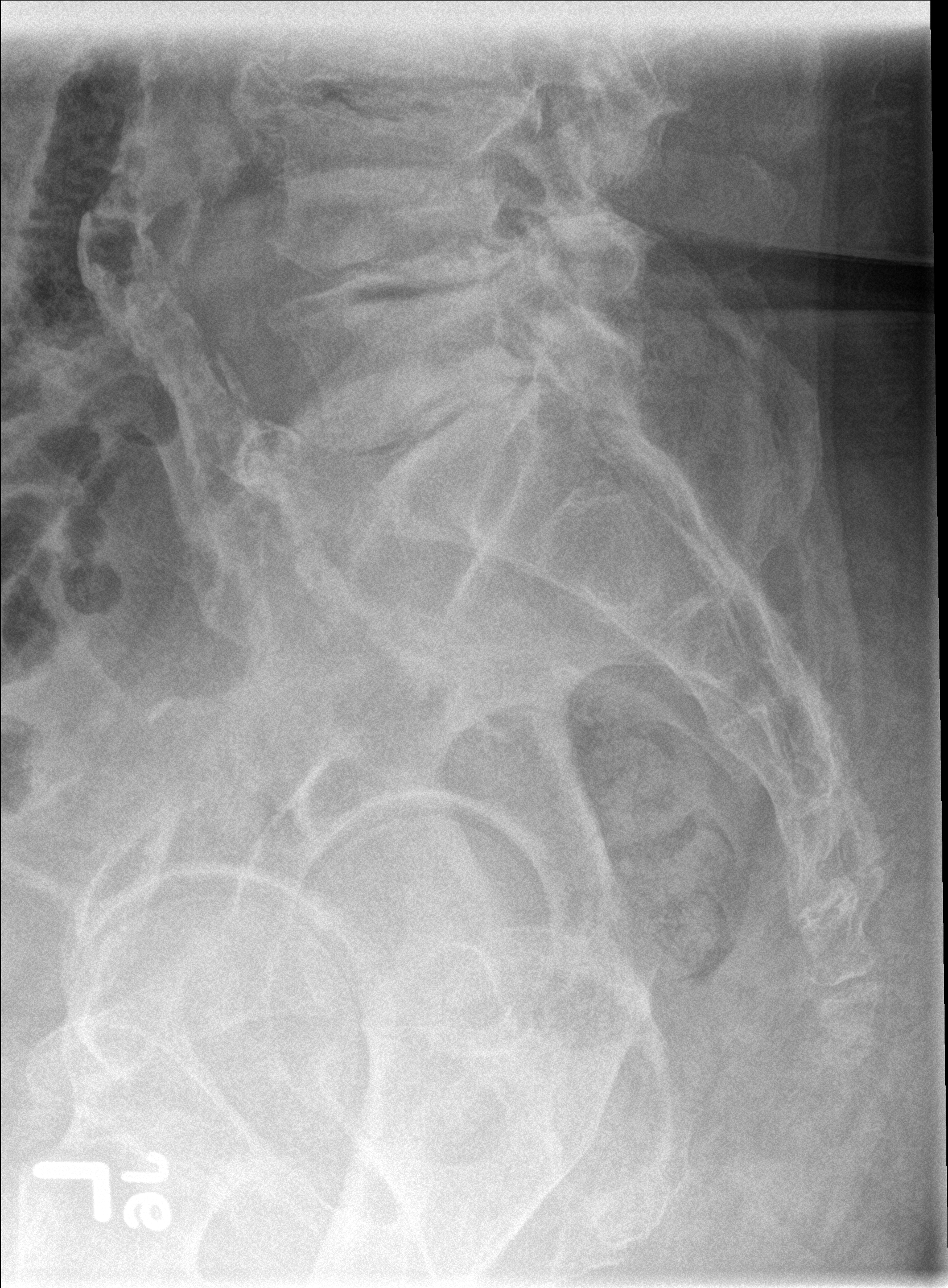

[5 of 5 positions shown; findings below may reference images not displayed]

FINDINGS: Five lumbar type vertebral bodies. Convex left lumbar spine
curvature. Left abdominal calcification projects in the region of
the lower pole left kidney. Cholecystectomy clips. Sacroiliac joints
are symmetric. Aortic atherosclerosis. Maintenance of vertebral body
height and alignment. Advanced lumbosacral spondylosis, with loss of
intervertebral disc height and facet arthropathy at multiple levels.
IMPRESSION: Advanced lumbosacral spondylosis, without acute osseous finding.

Atherosclerosis.

Possible left nephrolithiasis.

## 2016-03-15 IMAGING — DX DG ANKLE COMPLETE 3+V*L*
3 series · 3 of 3 positions shown · non-contrast
Comparison: None.

CLINICAL DATA: Initial encounter for multiple falls.  Pain.

EXAM:
LEFT ANKLE COMPLETE - 3+ VIEW

[ankle ap]
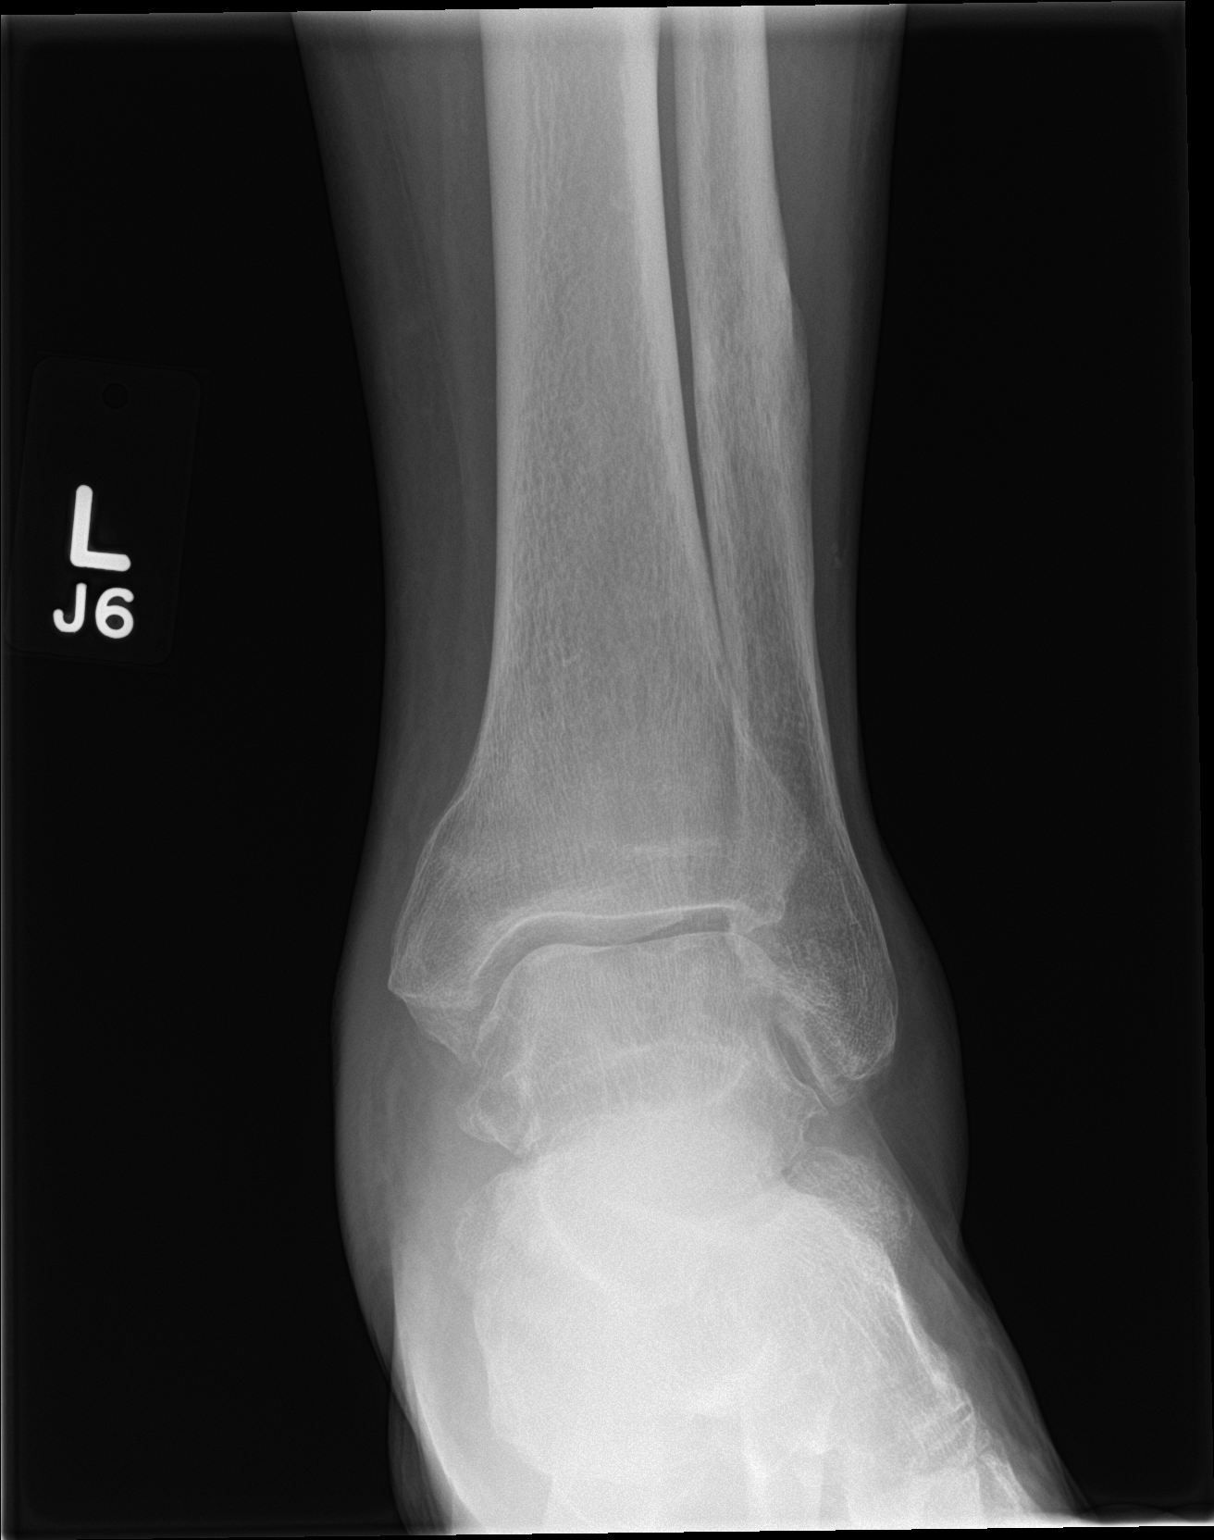

[ankle obl]
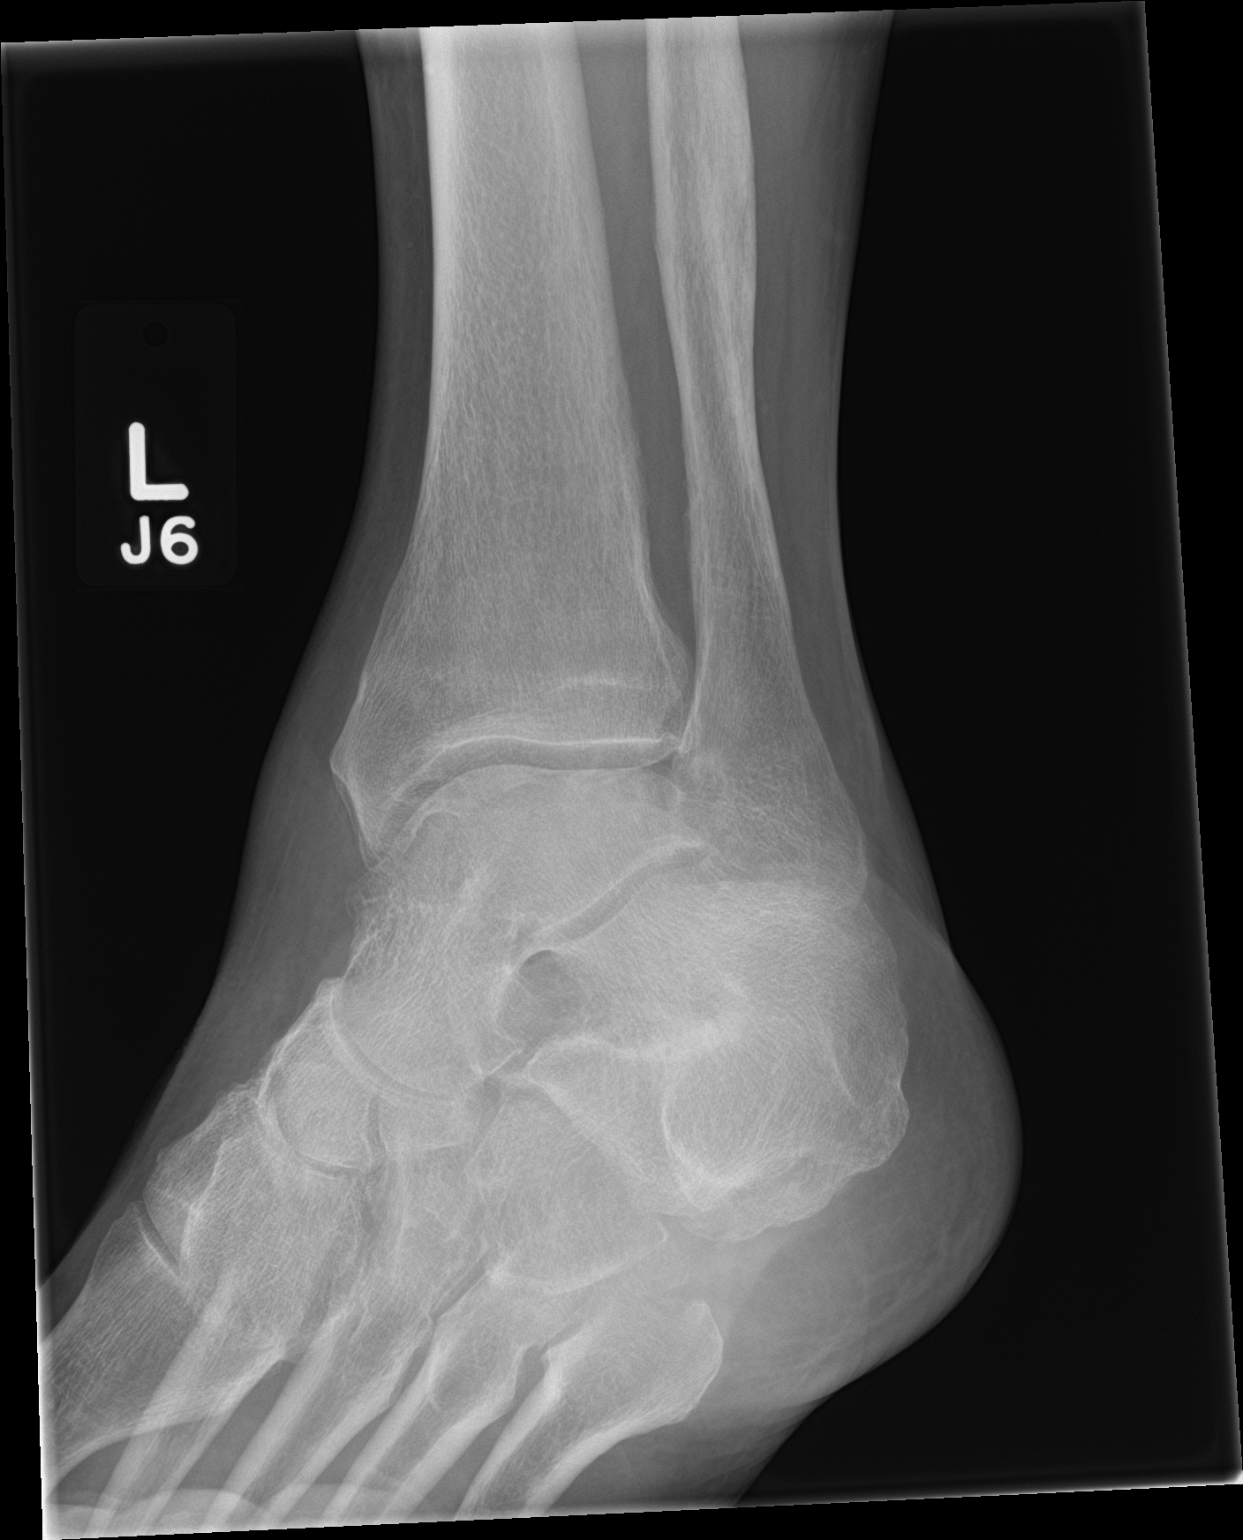

[ankle lat]
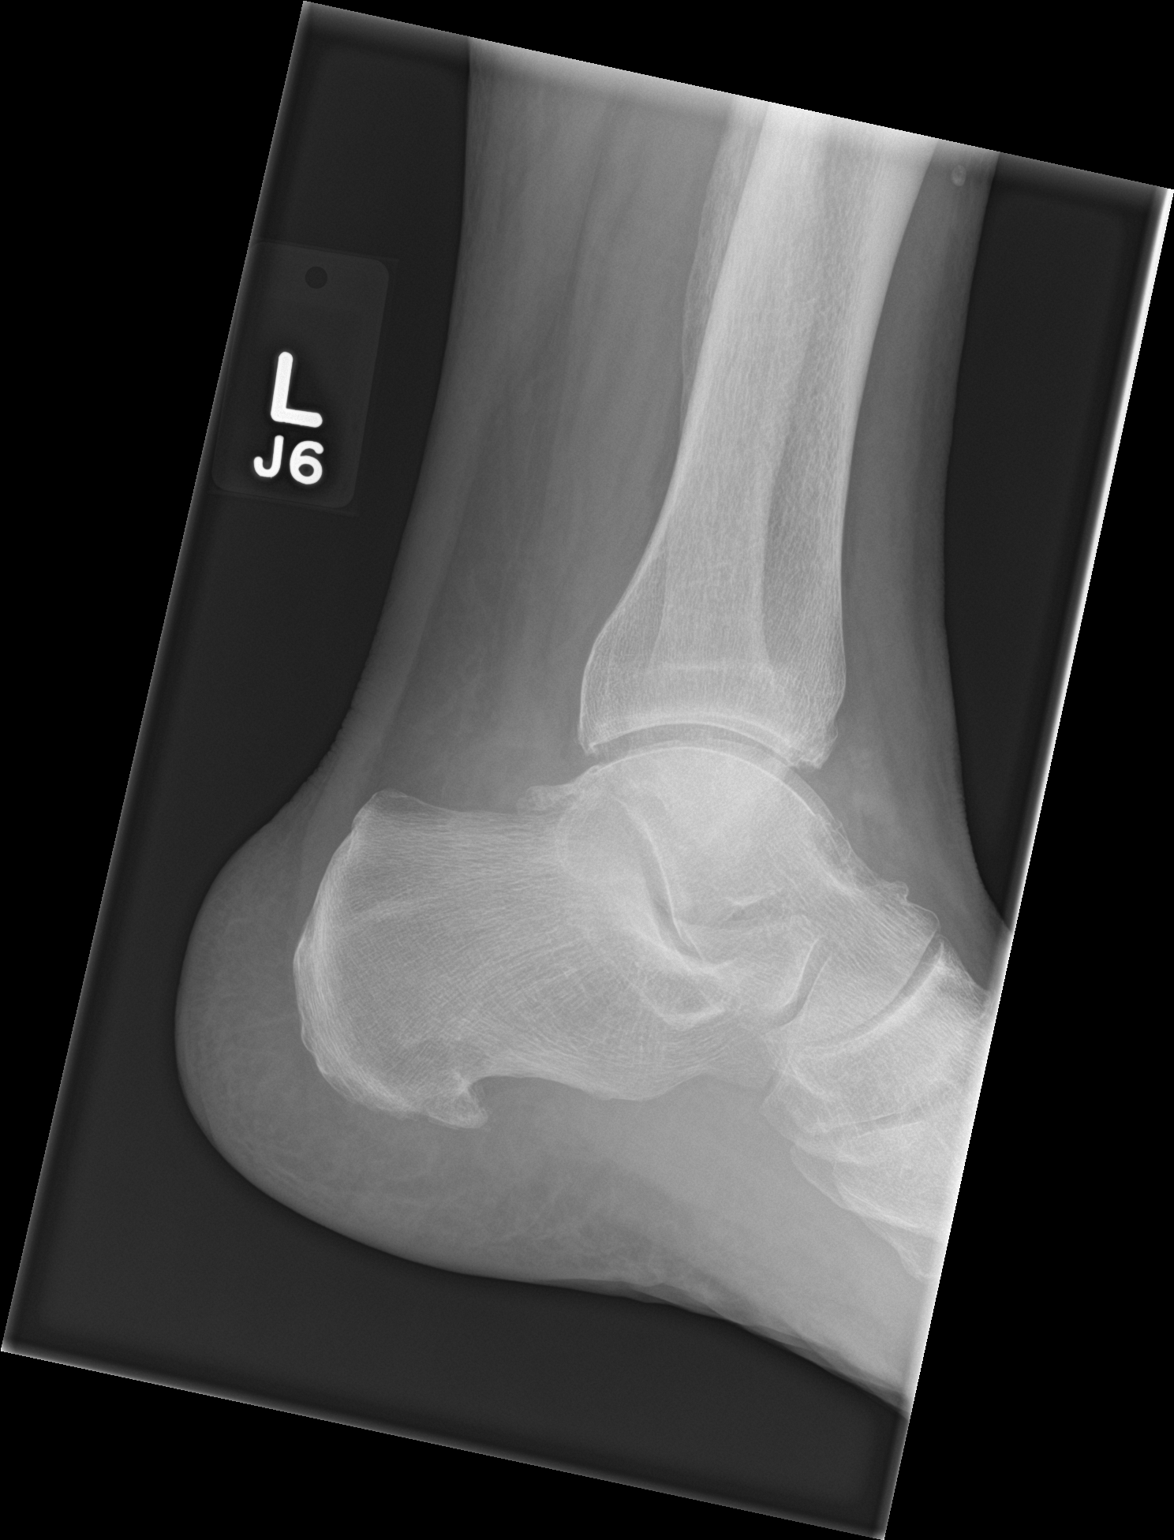

[3 of 3 positions shown; findings below may reference images not displayed]

FINDINGS: Bimalleolar soft tissue swelling is mild to moderate. Tibiotalar
osteoarthritis is mild. Small calcaneal spur. No acute fracture or
dislocation.
IMPRESSION: Soft tissue swelling and degenerative change, without acute osseous
finding.

## 2016-08-06 NOTE — Progress Notes (Signed)
Cardiology Office Note  Date: 08/07/2016   ID: Jack [name], DOB 07-May-1946, MRN [id number]  PCP: Octavio Graves, DO  Primary Cardiologist: Rozann Lesches, MD   Chief Complaint  Patient presents with  . Coronary Artery Disease    History of Present Illness: Jack [name] is a 70 y.o. male last seen in June. He presents for a routine follow-up visit. Reports no angina symptoms at this time on medical therapy. His wife is in a nursing home with poor health including dementia, he is under a lot of stress related to this.  I reviewed his medications. Cardiac regimen includes aspirin, Coreg, Norvasc, lisinopril, and Crestor. He continues to follow with [name] for primary care.  Stress testing from July of last year revealed inferior scar with no significant ischemia.  Past Medical History:  Diagnosis Date  . Coronary atherosclerosis of native coronary artery    Multivessel status post CABG  . HLD (hyperlipidemia)   . Hypertension   . Tobacco user     Past Surgical History:  Procedure Laterality Date  . APPENDECTOMY    . CHOLECYSTECTOMY    . CORONARY ARTERY BYPASS GRAFT  2008   LIMA to LAD, SVG to ramus, SVG to PDA and PLA  . FOOT SURGERY    . KNEE ARTHROPLASTY    . ROTATOR CUFF REPAIR    . SHOULDER SURGERY      Current Outpatient Prescriptions  Medication Sig Dispense Refill  . ALPRAZolam (XANAX) 1 MG tablet Take 1 mg by mouth 4 (four) times daily.     Marland Kitchen amLODipine (NORVASC) 5 MG tablet Take 5 mg by mouth every other day.   12  . aspirin 325 MG tablet Take 325 mg by mouth every other day.     . carvedilol (COREG) 6.25 MG tablet Take 12.5 mg by mouth every other day.     . celecoxib (CELEBREX) 200 MG capsule Take 200 mg by mouth 2 (two) times daily.     . cholecalciferol (VITAMIN D) 1000 UNITS tablet Take 1,000 Units by mouth daily.      . Cyanocobalamin (VITAMIN B-12 IJ) Inject as directed. Every 3 Months.    . cyanocobalamin 100 MCG tablet Take 100 mcg by  mouth every other day.     . ibuprofen (ADVIL,MOTRIN) 800 MG tablet Take 1 tablet (800 mg total) by mouth 3 (three) times daily. 21 tablet 0  . lisinopril (PRINIVIL,ZESTRIL) 10 MG tablet Take 10 mg by mouth every other day.     . oxyCODONE-acetaminophen (PERCOCET) 10-325 MG per tablet Take 1 tablet by mouth Every 4 hours as needed for pain.     . rosuvastatin (CRESTOR) 20 MG tablet Take 20 mg by mouth every other day.     . TESTOSTERONE IM Inject into the muscle. Monthly    . traZODone (DESYREL) 100 MG tablet Take 100 mg by mouth at bedtime.      No current facility-administered medications for this visit.    Allergies:  Patient has no known allergies.   Social History: The patient  reports that he has been smoking Cigarettes.  He has a 52.00 pack-year smoking history. He has never used smokeless tobacco. He reports that he drinks alcohol.   ROS:  Please see the history of present illness. Otherwise, complete review of systems is positive for arthritic pains.  All other systems are reviewed and negative.   Physical Exam: VS:  BP 118/78   Pulse 92   Ht 5'  11" (1.803 m)   Wt 265 lb (120.2 kg)   SpO2 96%   BMI 36.96 kg/m , BMI Body mass index is 36.96 kg/m.  Wt Readings from Last 3 Encounters:  08/07/16 265 lb (120.2 kg)  01/30/16 253 lb (114.8 kg)  03/06/15 270 lb (122.5 kg)    Overweight male, appears comfortable at rest. Using a cane. HEENT: Conjunctiva and lids normal, oropharynx clear  Neck: Supple, no elevated JVP, no thyromegaly.  Lungs: No wheezes, nonlabored breathing at rest.  Cardiac: Regular rate and rhythm, no S3, soft systolic murmur, no pericardial rub.  Abdomen: Soft, nontender, bowel sounds present.  Extremities: No pitting edema, distal pulses 2+.  Skin: Warm and dry.  Musculoskeletal: No kyphosis.  Neuropsychiatric: Alert and oriented x3, affect grossly appropriate.  ECG: I personally reviewed the tracing from 11/06/2015 at [facility] which showed sinus  tachycardia with occasional PVCs and couplets, left atrial enlargement, rule out old inferior infarct pattern.  Recent Labwork:  March 2017: BUN 13, creatinine 0.84, potassium 4.1, AST 14, ALT 15, hemoglobin 16.2, platelets 177  Other Studies Reviewed Today:  Echocardiogram September 2013: Mild LVH with LVEF 55-60%, basal inferior hypokinesis, diastolic dysfunction, mild left atrial enlargement, mild mitral regurgitation, aortic valve sclerosis, mild tricuspid regurgitation, RVSP 29 mm mercury.   Lexiscan Cardiolite 03/21/2015:  Findings consistent with prior myocardial infarction. Evidence of prior inferior infarct with no peri-infarct ischemia  This is an intermediate risk study. Intermediate risk due to mildly decreased LVEF, there is no current myocardium at jeopardy.  The left ventricular ejection fraction is mildly decreased (45-54%).  Assessment and Plan:  1. CAD status post CABG in 2008. Stress testing from July 2016 showed inferior scar without ischemia. He is not reporting angina on medical therapy and we will continue with observation.  2. Hyperlipidemia, remains on Crestor. Keep follow-up with [name] for her lipids.  3. Essential hypertension, blood pressure is well controlled today.  4. Obesity, we discussed weight loss measures today.  5. Tobacco abuse. Smoking cessation discussed over time, he has not been particularly motivated to quit.  Current medicines were reviewed with the patient today.   Disposition: Follow-up in one year, sooner if needed.  Signed, Satira Sark, MD, [facility] 08/07/2016 2:42 PM    [facility], [place], [place] [address] Phone: [phone]; Fax: [phone]

## 2016-08-07 ENCOUNTER — Ambulatory Visit (INDEPENDENT_AMBULATORY_CARE_PROVIDER_SITE_OTHER): Payer: Medicare HMO | Admitting: Cardiology

## 2016-08-07 ENCOUNTER — Encounter: Payer: Self-pay | Admitting: Cardiology

## 2016-08-07 VITALS — BP 118/78 | HR 92 | Ht 71.0 in | Wt 265.0 lb

## 2016-08-07 DIAGNOSIS — E782 Mixed hyperlipidemia: Secondary | ICD-10-CM | POA: Diagnosis not present

## 2016-08-07 DIAGNOSIS — Z720 Tobacco use: Secondary | ICD-10-CM | POA: Diagnosis not present

## 2016-08-07 DIAGNOSIS — I2510 Atherosclerotic heart disease of native coronary artery without angina pectoris: Secondary | ICD-10-CM | POA: Diagnosis not present

## 2016-08-07 DIAGNOSIS — I10 Essential (primary) hypertension: Secondary | ICD-10-CM

## 2016-08-07 NOTE — Patient Instructions (Signed)

## 2017-05-05 NOTE — Progress Notes (Signed)
Patient ID: Jack [name], male    DOB: 06-10-1946, 70 y.o.   MRN: [id number]  Chief Complaint  Patient presents with  . Hyperlipidemia  . Hypertension  . Coronary Artery Disease    Allergies Patient has no known allergies.  Subjective:   Jack [name] is a 71 y.o. male who presents to [facility] today.  HPI Here to establish care. Enjoys working on remodeling a house. Wants to rent out the house. Reports that has been on the percocet for over 15 years for shoulder pain. Has had surgery on shoulder in the past. Gets pain in shoulder that is to hard to live with , reports that cannot make it without the pain medications. Reports that he used to get 180 percocet a month and then went down to 120 a month b/c had a couple of falls. Sees the cardiologist for heart disease. Has still continued to smoke. Denies any CP or SOB.  Reports that has been on the xanax and trazodone since the death of his wife. Reports that rarely uses the xanax and only takes it when gets anxious regarding the death of his wife.  Reports that has been taking the celebrex for his cholesterol. Discussed what celebrex is used for, then he reports that he must have been off the cholesterol medication for the whole year.  Reports that mood is good. Does not feel depressed. Sleep well. Eats well. No suicide ideation.   Hypertension  This is a chronic problem. The current episode started more than 1 month ago. The problem has been resolved since onset. Pertinent negatives include no anxiety, blurred vision, chest pain, headaches, malaise/fatigue, neck pain, orthopnea, palpitations, peripheral edema, PND or shortness of breath. There are no associated agents to hypertension. Risk factors for coronary artery disease include dyslipidemia, male gender, smoking/tobacco exposure and obesity. Past treatments include calcium channel blockers. Compliance problems include diet and exercise.  Hypertensive end-organ damage  includes CAD/MI.  Hyperlipidemia  This is a chronic problem. The current episode started more than 1 year ago. Condition status: is not sure. Lipid results: reports that has not had it checked in a long time. He has no history of diabetes. Factors aggravating his hyperlipidemia include fatty foods and beta blockers. Pertinent negatives include no chest pain or shortness of breath. Current antihyperlipidemic treatment includes diet change (used to be on statins but just realized not on them. ).  Nicotine Dependence  Presents for initial visit. Symptoms include cravings. Preferred tobacco types include cigarettes. Preferred cigarette types include non-filtered. Preferred strength is regular. Preferred cigarettes are non-menthol. Preferred brands include [employer]. His urge triggers include company of smokers and meal time. His first smoke is before 6 AM. He started smoking when he was 67-47 years old. Jack [name] is not interested in quitting. Jack [name] has tried to quit 5 or more times. There is no history of alcohol abuse and drug use.  Shoulder Pain   The pain is present in the right shoulder and left shoulder. This is a chronic problem. The current episode started more than 1 year ago. There has been no history of extremity trauma. The problem occurs constantly. The problem has been unchanged. The quality of the pain is described as aching. The pain is at a severity of 7/10. The pain is severe. Associated symptoms include joint locking, a limited range of motion and stiffness. Pertinent negatives include no fever, joint swelling or numbness. The symptoms are aggravated by activity. He has tried  oral narcotics for the symptoms. The treatment provided moderate relief. There is no history of diabetes or rheumatoid arthritis.    Past Medical History:  Diagnosis Date  . Coronary atherosclerosis of native coronary artery    Multivessel status post CABG  . HLD (hyperlipidemia)   . Hypertension   . Tobacco user      Past Surgical History:  Procedure Laterality Date  . APPENDECTOMY     age 32  . CHOLECYSTECTOMY  11/23/01  . CORONARY ARTERY BYPASS GRAFT  11/24/2006   LIMA to LAD, SVG to ramus, SVG to PDA and PLA  . FOOT SURGERY    . KNEE ARTHROPLASTY    . ROTATOR CUFF REPAIR    . SHOULDER SURGERY      Family History  Problem Relation Age of Onset  . Heart attack Mother   . Cerebral aneurysm Father   . Coronary artery disease Neg Hx   . Diabetes Neg Hx   . Hypertension Neg Hx      Social History   Social History  . Marital status: Married    Spouse name: N/A  . Number of children: N/A  . Years of education: N/A   Occupational History  . retired    Social History Main Topics  . Smoking status: Current Every Day Smoker    Packs/day: 1.00    Years: 57.00    Types: Cigarettes  . Smokeless tobacco: Never Used  . Alcohol use 0.0 oz/week  . Drug use: No  . Sexual activity: No   Other Topics Concern  . None   Social History Narrative   Had been married for over 40 years and wife passed away in 24-Nov-2015 from cancer. Has children and grandchildren and great grandchildren. Gets together with family for dinner frequently. Lives alone and cooks for self. Live in [place]. 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 Eats all food groups.      Outpatient Medications Prior to Visit  Medication Sig Dispense Refill  . ALPRAZolam (XANAX) 1 MG tablet Take 1 mg by mouth 4 (four) times daily.     Marland Kitchen amLODipine (NORVASC) 5 MG tablet Take 5 mg by mouth every other day.   12  . aspirin 325 MG tablet Take 325 mg by mouth every other day.     . carvedilol (COREG) 6.25 MG tablet Take 12.5 mg by mouth every other day.     . traZODone (DESYREL) 100 MG tablet Take 100 mg by mouth at bedtime.     . celecoxib (CELEBREX) 200 MG capsule Take 200 mg by mouth 2 (two) times daily.     . cholecalciferol (VITAMIN D) 1000 UNITS tablet Take 1,000 Units by mouth  daily.      . Cyanocobalamin (VITAMIN B-12 IJ) Inject as directed. Every 3 Months.    . cyanocobalamin 100 MCG tablet Take 100 mcg by mouth every other day.     . ibuprofen (ADVIL,MOTRIN) 800 MG tablet Take 1 tablet (800 mg total) by mouth 3 (three) times daily. 21 tablet 0  . lisinopril (PRINIVIL,ZESTRIL) 10 MG tablet Take 10 mg by mouth every other day.     . oxyCODONE-acetaminophen (PERCOCET) 10-325 MG per tablet Take 1 tablet by mouth Every 4 hours as needed for pain.     . rosuvastatin (CRESTOR) 20 MG tablet Take 20 mg by mouth every other day.     . TESTOSTERONE IM Inject into the  muscle. Monthly     No facility-administered medications prior to visit.     Review of Systems  Constitutional: Negative for fever and malaise/fatigue.  Eyes: Negative for blurred vision.  Respiratory: Negative for shortness of breath.   Cardiovascular: Negative for chest pain, palpitations, orthopnea and PND.  Musculoskeletal: Positive for stiffness. Negative for neck pain.  Neurological: Negative for numbness and headaches.     Objective:   BP 130/78   Pulse (!) 56   Temp (!) 97.5 F (36.4 C) (Tympanic)   Ht 5\' 11"  (1.803 m)   Wt 250 lb (113.4 kg)   SpO2 94%   BMI 34.87 kg/m   Physical Exam  Constitutional: He is oriented to person, place, and time. He appears well-developed and well-nourished.  HENT:  Head: Normocephalic and atraumatic.  Eyes: Pupils are equal, round, and reactive to light. EOM are normal.  Neck: Normal range of motion. Neck supple. No JVD present. No tracheal deviation present.  Cardiovascular: Normal rate and normal heart sounds.   Pulses:      Dorsalis pedis pulses are 2+ on the right side, and 2+ on the left side.  Pulmonary/Chest: Effort normal and breath sounds normal.  Musculoskeletal: He exhibits no edema.       Right shoulder: He exhibits decreased range of motion, bony tenderness and pain. He exhibits no tenderness, no swelling, no effusion and no deformity.    Lymphadenopathy:    He has no cervical adenopathy.  Neurological: He is alert and oriented to person, place, and time. No cranial nerve deficit.  Skin: Skin is warm, dry and intact.  Psychiatric: He has a normal mood and affect. His behavior is normal. Judgment and thought content normal.  Vitals reviewed.    Assessment and Plan   1. Hypertension, unspecified type Recommendations discussed include consuming low-fat daily products, poultry, fish, legumes, non-tropical vegetable oils, and nuts; and limiting intake of sweets, sugar-sweetened beverages, and red meat. Discussed following a plan such as the Dietary Approaches to Stop Hypertension (DASH) diet. Patient to read up on this diet.  Continue medications as directed.  - Basic metabolic panel - Hemoglobin A1c  2. Encounter for screening for lung cancer Discussed with patient in detail and would like the testing done. Ordered and will follow up.  - CT CHEST LUNG CA SCREEN LOW DOSE W/O CM; Future  3. Tobacco abuse Patient defers smoking cessation help/medications at this time. Understands the risks of tobacco use. Smoking cessation instruction/counseling given and discussed.   4. Chronic pain in right shoulder Discussed with patient in detail that I would not be giving him this pain medication on a monthly basis. He does not want to see orthopedics for evaluation of shoulder at this time. Reports that has had surgery and does not want to do that again. Would like to be seen at a pain clinic. Patient counseled in detail regarding the risks of medication. Told to call or return to clinic if develop any worrisome signs or symptoms. Patient voiced understanding.  Sedation and fall risk of medication discussed. Also discussed not to combine this with xanax b/c of risks of sedations, etc.  - Ambulatory referral to Pain Clinic - oxyCODONE-acetaminophen (PERCOCET) 10-325 MG tablet; Take 1 tablet by mouth every 4 (four) hours as needed for pain.   Dispense: 30 tablet; Refill: 0  5. Vitamin D deficiency Reports that was put on Vitamin 5000 IU a day for deficiency years ago. Has not rechecked and still taking the same dose.  -  Vitamin D (25 hydroxy)  6. ASCVD (arteriosclerotic cardiovascular disease) Follow up with cardiology as directed, has upcoming appointment. Will discuss statin use with cardiology if he see him before he comes back to me.   7. Mixed hyperlipidemia Off stains for about a year.  - Lipid panel  8. High risk medication use Ordered due to statin use and possible need to restart.  - Hepatic function panel  9. Fatigue, unspecified type Check today.  - CBC with Differential/Platelet - TSH  10. Need for vaccination Reports that has had the pneumonia shot two years in a row. Reports that will have records sent to our office for review.  - Flu Vaccine QUAD 6+ mos PF IM (Fluarix Quad PF)  11. Need for immunization against influenza  - Flu Vaccine QUAD 36+ mos IM  Patient will follow up in 2 months for recheck and to do an annual wellness exam. Reports that has never had colonoscopy but would like to discuss before doing referral.  Spent great deal of time counseling on medication risks of narcotics and benzodiazepines. Voiced understanding. He also understands that he cannot get another prescription for these pills at our office or the xanax b/c it is a dangerous combination.  Reports that he rarely takes the xanax and is going to cut down on his use of it.      Caren Macadam, MD 05/06/2017

## 2017-05-06 ENCOUNTER — Encounter: Payer: Self-pay | Admitting: Family Medicine

## 2017-05-06 ENCOUNTER — Ambulatory Visit (INDEPENDENT_AMBULATORY_CARE_PROVIDER_SITE_OTHER): Payer: Medicare HMO | Admitting: Family Medicine

## 2017-05-06 VITALS — BP 130/78 | HR 56 | Temp 97.5°F | Ht 71.0 in | Wt 250.0 lb

## 2017-05-06 DIAGNOSIS — G8929 Other chronic pain: Secondary | ICD-10-CM | POA: Diagnosis not present

## 2017-05-06 DIAGNOSIS — Z79899 Other long term (current) drug therapy: Secondary | ICD-10-CM | POA: Diagnosis not present

## 2017-05-06 DIAGNOSIS — M25511 Pain in right shoulder: Secondary | ICD-10-CM | POA: Diagnosis not present

## 2017-05-06 DIAGNOSIS — R5383 Other fatigue: Secondary | ICD-10-CM

## 2017-05-06 DIAGNOSIS — Z23 Encounter for immunization: Secondary | ICD-10-CM | POA: Diagnosis not present

## 2017-05-06 DIAGNOSIS — Z720 Tobacco use: Secondary | ICD-10-CM | POA: Diagnosis not present

## 2017-05-06 DIAGNOSIS — I10 Essential (primary) hypertension: Secondary | ICD-10-CM

## 2017-05-06 DIAGNOSIS — E559 Vitamin D deficiency, unspecified: Secondary | ICD-10-CM | POA: Diagnosis not present

## 2017-05-06 DIAGNOSIS — E782 Mixed hyperlipidemia: Secondary | ICD-10-CM | POA: Diagnosis not present

## 2017-05-06 DIAGNOSIS — I2510 Atherosclerotic heart disease of native coronary artery without angina pectoris: Secondary | ICD-10-CM | POA: Diagnosis not present

## 2017-05-06 DIAGNOSIS — Z122 Encounter for screening for malignant neoplasm of respiratory organs: Secondary | ICD-10-CM

## 2017-05-06 MED ORDER — OXYCODONE-ACETAMINOPHEN 10-325 MG PO TABS: 1.0000 | ORAL_TABLET | ORAL | 0 refills | Status: DC | PRN

## 2017-05-06 NOTE — Patient Instructions (Signed)
Peligros de fumar (Smoking Hazards) Fumar cigarrillos es extremadamente malo para su salud. El humo del tabaco est compuesto por ms de 200 venenos conocidos. Contiene gases venenosos xido de nitrgeno y monxido de carbono. Hay ms de 60 sustancias qumicas en el humo del tabaco que causan cncer. Algunas de las sustancias qumicas halladas en el humo del cigarrillo son:  Cianuro.  Benceno.  Formaldehido.  Metanol (alcohol de la [place]).  Acetileno (combustible usado en sopletes).  AmonacoMemory Dance fumar muy poco acorta la expectativa de vida en varios aos. Usted puede reducir significativamente el riesgo de problemas mdicos para usted y su familia si deja de fumar ahora. El hbito de fumar es la causa de [place] y enfermedad dentro de nuestra sociedad que mejor se IT trainer. Pasados unos [employer] de haber dejado de fumar, la circulacin [place], el riesgo de tener un ataque cardaco [place], y la capacidad de sus pulmones [place]. Podr haber un aumento de la flema los primeros das despus de dejarlo, y podr tomar meses para que sus pulmones se limpien completamente. Pasados 10 aos despus de haber dejado de fumar, el riesgo de Horticulturist, commercial de pulmn se iguala casi al de un no fumador. [place] LOS RIESGOS DE FUMAR? Los fumadores de cigarrillos tienen un aumento en el riesgo de tener problemas mdicos graves, entre los que se incluyen:  Hotel manager de pulmn.  Enfermedades pulmonares (neumona, bronquitis, enfisema).  Dolor en el pecho debido a que el corazn no obtiene la cantidad suficiente de oxgeno (angina).  Enfermedades cardacas y de los vasos sanguneos perifricos.  Hipertensin arterial.  Ictus.  Cncer bucal (labios, boca, laringe).  Cncer de vejiga.  Cncer de pncreas.  Cncer de cuello uterino.  Complicaciones del embarazo, incluyendo parto prematuro.  Bebs que nacen ms pequeos o muertos, defectos del nacimiento y dao gentico al  esperma.  Menopausia precoz.  Bajo nivel de estrgenos en la mujer.  Infertilidad.  Arrugas faciales.  Ceguera.  Aumento del riesgo de romperse huesos (fracturas).  Demencia senil.  lceras de estmago y hemorragias internas.  Retardo en el tiempo de curacin de las heridas y riesgo de complicaciones durante [place]. Debido a la exposicin al humo de segunda mano, los hijos de fumadores tienen los siguientes riesgos :  Sndrome de muerte sbita del Pharmacist, hospital.  Infecciones respiratorias.  Cncer de pulmn.  Enfermedades cardacas.  Infecciones en los odos. [place]? La nicotina es la sustancia qumica del tabaco que puede causar adiccin o dependencia. Cuando usted fuma e inhala, la nicotina se absorbe rpidamente en el torrente sanguneo a travs de los pulmones. Tanto si se inhala como si no se inhala, la nicotina es [place]. cules son las ventajas de dejar de fumar? El dejar de fumar trae muchos beneficios para la salud. Algunos de ellos son:  Disminuye la probabilidad de sufrir cncer y enfermedades cardacas. Los beneficios para la salud son casi inmediatos.  La presin arterial, la frecuencia cardaca y respiratoria comienzan a volver a los patrones normales.  Pueden verse mejorada la calidad de vida. [place]? Fumar es una adiccin con efectos fsicos y psicolgicos y los hbitos a largo plazo pueden ser difciles de Quarry manager. El mdico tambin podr indicar:  Programas y recursos de la comunidad, que pueden incluir 81 de [place], educacin o terapias.  Reemplazos de nicotina, como parches, goma de Higher education careers adviser y [employer]. selos segn las indicaciones. No reemplace el fumar cigarrillos con cigarrillos electrnicos. La seguridad de los cigarrillos  electrnicos se desconoce, y podran tener qumicos txicos. Marthenia Rolling [place] INFORMACIN  American Lung Association (Asociacin Estadounidense del Pulmn):  www.lung.[place] (Asociacin Estadounidense del cncer) www.cancer.org  Esta informacin no tiene Marine scientist el consejo del mdico. Asegrese de hacerle al mdico cualquier pregunta que tenga. Document Released: 08/11/2005 Document Revised: 12/03/2015 Document Reviewed: 01/31/2013 Elsevier Interactive Patient Education  2017 [employer].

## 2017-05-07 ENCOUNTER — Telehealth: Payer: Self-pay | Admitting: Family Medicine

## 2017-05-07 ENCOUNTER — Encounter: Payer: Self-pay | Admitting: Family Medicine

## 2017-05-07 DIAGNOSIS — E782 Mixed hyperlipidemia: Secondary | ICD-10-CM

## 2017-05-07 LAB — HEPATIC FUNCTION PANEL
AG Ratio: 1.7 (calc) (ref 1.0–2.5)
ALKALINE PHOSPHATASE (APISO): 79 U/L (ref 40–115)
ALT: 13 U/L (ref 9–46)
AST: 12 U/L (ref 10–35)
Albumin: 4.3 g/dL (ref 3.6–5.1)
BILIRUBIN DIRECT: 0.1 mg/dL (ref 0.0–0.2)
BILIRUBIN INDIRECT: 0.5 mg/dL (ref 0.2–1.2)
GLOBULIN: 2.5 g/dL (ref 1.9–3.7)
TOTAL PROTEIN: 6.8 g/dL (ref 6.1–8.1)
Total Bilirubin: 0.6 mg/dL (ref 0.2–1.2)

## 2017-05-07 LAB — HEMOGLOBIN A1C
EAG (MMOL/L): 5.7 (calc)
Hgb A1c MFr Bld: 5.2 % of total Hgb (ref <5.7)
Mean Plasma Glucose: 103 (calc)

## 2017-05-07 LAB — CBC WITH DIFFERENTIAL/PLATELET
Basophils Absolute: 85 cells/uL (ref 0–200)
Basophils Relative: 0.9 %
EOS PCT: 1.7 %
Eosinophils Absolute: 160 cells/uL (ref 15–500)
HEMATOCRIT: 51.4 % — AB (ref 38.5–50.0)
HEMOGLOBIN: 17.2 g/dL — AB (ref 13.2–17.1)
LYMPHS ABS: 2378 {cells}/uL (ref 850–3900)
MCH: 32.9 pg (ref 27.0–33.0)
MCHC: 33.5 g/dL (ref 32.0–36.0)
MCV: 98.3 fL (ref 80.0–100.0)
MONOS PCT: 5.8 %
MPV: 10.7 fL (ref 7.5–12.5)
NEUTROS ABS: 6232 {cells}/uL (ref 1500–7800)
Neutrophils Relative %: 66.3 %
Platelets: 178 10*3/uL (ref 140–400)
RBC: 5.23 10*6/uL (ref 4.20–5.80)
RDW: 12.7 % (ref 11.0–15.0)
Total Lymphocyte: 25.3 %
WBC mixed population: 545 cells/uL (ref 200–950)
WBC: 9.4 10*3/uL (ref 3.8–10.8)

## 2017-05-07 LAB — BASIC METABOLIC PANEL
BUN/Creatinine Ratio: 8 (calc) (ref 6–22)
BUN: 12 mg/dL (ref 7–25)
CHLORIDE: 102 mmol/L (ref 98–110)
CO2: 30 mmol/L (ref 20–32)
Calcium: 9.6 mg/dL (ref 8.6–10.3)
Creat: 1.45 mg/dL — ABNORMAL HIGH (ref 0.70–1.18)
Glucose, Bld: 94 mg/dL (ref 65–139)
POTASSIUM: 4.9 mmol/L (ref 3.5–5.3)
Sodium: 141 mmol/L (ref 135–146)

## 2017-05-07 LAB — TSH: TSH: 2.39 m[IU]/L (ref 0.40–4.50)

## 2017-05-07 LAB — LIPID PANEL
CHOL/HDL RATIO: 5.9 (calc) — AB (ref <5.0)
CHOLESTEROL: 206 mg/dL — AB (ref <200)
HDL: 35 mg/dL — ABNORMAL LOW (ref >40)
LDL Cholesterol (Calc): 138 mg/dL (calc) — ABNORMAL HIGH
Non-HDL Cholesterol (Calc): 171 mg/dL (calc) — ABNORMAL HIGH (ref <130)
Triglycerides: 190 mg/dL — ABNORMAL HIGH (ref <150)

## 2017-05-07 LAB — VITAMIN D 25 HYDROXY (VIT D DEFICIENCY, FRACTURES): Vit D, 25-Hydroxy: 47 ng/mL (ref 30–100)

## 2017-05-07 MED ORDER — ROSUVASTATIN CALCIUM 20 MG PO TABS: 20.0000 mg | ORAL_TABLET | Freq: Every day | ORAL | 3 refills | Status: DC

## 2017-05-07 NOTE — Telephone Encounter (Signed)
Advise patient that I sent in crestor 20 mg, take it at bedtime. We will recheck cholesterol and liver tests in 2-3 months. Work on eating a low fat diet with vegetables and fiber.

## 2017-05-07 NOTE — Telephone Encounter (Signed)
Please call patient and tell him that his LDL is about double what it should be b/c of his heart disease. Is he willing to restart a cholesterol medication or is he going to talk with Dr. Domenic Polite about this?Was there a reason he was on crestor or had he ever tried another agent like lipitor or simvastatin

## 2017-05-07 NOTE — Telephone Encounter (Signed)
Spoke with patient regarding LDL.  He is willing to start on medication.  He has used crestor in the past but is open to options.  Advised patient rx would be sent to pharmacy.

## 2017-05-07 NOTE — Telephone Encounter (Signed)
Informed patient of medication and educated on healthier eating habits.

## 2017-05-12 ENCOUNTER — Telehealth: Payer: Self-pay | Admitting: Family Medicine

## 2017-05-12 DIAGNOSIS — M25529 Pain in unspecified elbow: Secondary | ICD-10-CM

## 2017-05-12 NOTE — Telephone Encounter (Signed)
Ask patient what is going on with elbow. Would he like for [place] to get him an appointment with Dr. Keeling/orthopedic doctor here in [place]. Otherwise, see when appointment is available in our office. Thanks.

## 2017-05-12 NOTE — Telephone Encounter (Signed)
Patient left a message asking if he can be worked in, his elbow is swelling.

## 2017-05-13 NOTE — Telephone Encounter (Signed)
Spoke with patient regarding elbow pain.  He is agreeable to referral to ortho.  Order placed.

## 2017-06-09 ENCOUNTER — Encounter: Payer: Self-pay | Admitting: Student in an Organized Health Care Education/Training Program

## 2017-06-09 ENCOUNTER — Ambulatory Visit
Payer: Medicare HMO | Attending: Student in an Organized Health Care Education/Training Program | Admitting: Student in an Organized Health Care Education/Training Program

## 2017-06-09 VITALS — BP 130/71 | HR 81 | Temp 98.0°F | Resp 18 | Ht 71.0 in | Wt 245.0 lb

## 2017-06-09 DIAGNOSIS — Z9049 Acquired absence of other specified parts of digestive tract: Secondary | ICD-10-CM | POA: Diagnosis not present

## 2017-06-09 DIAGNOSIS — E782 Mixed hyperlipidemia: Secondary | ICD-10-CM | POA: Insufficient documentation

## 2017-06-09 DIAGNOSIS — M19011 Primary osteoarthritis, right shoulder: Secondary | ICD-10-CM

## 2017-06-09 DIAGNOSIS — M47816 Spondylosis without myelopathy or radiculopathy, lumbar region: Secondary | ICD-10-CM

## 2017-06-09 DIAGNOSIS — G894 Chronic pain syndrome: Secondary | ICD-10-CM | POA: Diagnosis present

## 2017-06-09 DIAGNOSIS — M25511 Pain in right shoulder: Secondary | ICD-10-CM | POA: Diagnosis not present

## 2017-06-09 DIAGNOSIS — M25512 Pain in left shoulder: Secondary | ICD-10-CM | POA: Diagnosis not present

## 2017-06-09 DIAGNOSIS — Z9889 Other specified postprocedural states: Secondary | ICD-10-CM | POA: Insufficient documentation

## 2017-06-09 DIAGNOSIS — G8929 Other chronic pain: Secondary | ICD-10-CM | POA: Insufficient documentation

## 2017-06-09 DIAGNOSIS — Z8249 Family history of ischemic heart disease and other diseases of the circulatory system: Secondary | ICD-10-CM | POA: Diagnosis not present

## 2017-06-09 DIAGNOSIS — M5136 Other intervertebral disc degeneration, lumbar region: Secondary | ICD-10-CM | POA: Insufficient documentation

## 2017-06-09 DIAGNOSIS — I10 Essential (primary) hypertension: Secondary | ICD-10-CM | POA: Diagnosis not present

## 2017-06-09 DIAGNOSIS — M170 Bilateral primary osteoarthritis of knee: Secondary | ICD-10-CM | POA: Insufficient documentation

## 2017-06-09 DIAGNOSIS — Z7982 Long term (current) use of aspirin: Secondary | ICD-10-CM | POA: Insufficient documentation

## 2017-06-09 DIAGNOSIS — F17200 Nicotine dependence, unspecified, uncomplicated: Secondary | ICD-10-CM | POA: Insufficient documentation

## 2017-06-09 DIAGNOSIS — Z79899 Other long term (current) drug therapy: Secondary | ICD-10-CM | POA: Insufficient documentation

## 2017-06-09 DIAGNOSIS — Z79891 Long term (current) use of opiate analgesic: Secondary | ICD-10-CM | POA: Diagnosis not present

## 2017-06-09 DIAGNOSIS — Z951 Presence of aortocoronary bypass graft: Secondary | ICD-10-CM | POA: Insufficient documentation

## 2017-06-09 DIAGNOSIS — M19012 Primary osteoarthritis, left shoulder: Secondary | ICD-10-CM | POA: Insufficient documentation

## 2017-06-09 DIAGNOSIS — I2510 Atherosclerotic heart disease of native coronary artery without angina pectoris: Secondary | ICD-10-CM | POA: Insufficient documentation

## 2017-06-09 MED ORDER — TIZANIDINE HCL 4 MG PO TABS: 2.0000 mg | ORAL_TABLET | Freq: Two times a day (BID) | ORAL | 0 refills | Status: DC | PRN

## 2017-06-09 NOTE — Patient Instructions (Addendum)
1. UDS today. Should be positive for oxycodone and Xanax. We will obtain another urine drug screen at the next visit, I expect this to be negative for Xanax and positive for oxycodone.  2. Referral to pain psychology. Evaluation needs to be completed before we can take over opioid therapy.  3. Please discontinue Xanax. I will not be able to provide you with opioid therapy if you continue Xanax.  4. Tizanidine 2 mg twice a day when necessary muscle spasms  5. Bilateral intra-articular Hyalgan knee injection, right intra-articular steroid injection.  ** patient to stop full dose aspirin 3 days prior to procedure and change to 81 mg aspirin.**    DO NOT EAT OR DRINK FOR 3 HOURS PRIOR TO PROCEDURE.   3 DAYS PRIOR TO PROCEDURE, STOP ASPIRIN 325 MG AND SWITCH TO ASPRIN 81 MG.   TAKE BLOOD PRESSURE MEDICATION THE MORNING OF PROCEDURE WITH A TINY SIP OF WATER.   PICK UP YOUR tIZANIDINE FROM PHARMACY

## 2017-06-09 NOTE — Progress Notes (Signed)
Safety precautions to be maintained throughout the outpatient stay will include: orient to surroundings, keep bed in low position, maintain call bell within reach at all times, provide assistance with transfer out of bed and ambulation.  

## 2017-06-09 NOTE — Progress Notes (Signed)
Dictation #1 [id number]  [id number] Patient's Name: Jack [name]  MRN: [id number]  Referring Provider: Caren Macadam, MD  DOB: 1945-12-02  PCP: Caren Macadam, MD  DOS: 06/09/2017  Note by: [name], MD  Service setting: Ambulatory outpatient  Specialty: Interventional Pain Management  Location: [facility] (AMB) Pain Management Facility  Visit type: Initial Patient Evaluation  Patient type: New Patient   Primary Reason(s) for Visit: Encounter for initial evaluation of one or more chronic problems (new to examiner) potentially causing chronic pain, and posing a threat to normal musculoskeletal function. (Level of risk: High) CC: Knee Pain (bilateral); Shoulder Pain (bilateral); and Back Pain (low)  HPI  [name] is a 71 y.o. year old, male patient, who comes today to see [place] for the first time for an initial evaluation of his chronic pain. He has HYPERLIPIDEMIA-MIXED; Tobacco abuse, in remission; Essential hypertension, benign; Coronary atherosclerosis of native coronary artery; Hypertension; Osteoarthritis of both shoulders; Lumbar spondylosis; Lumbar degenerative disc disease; Chronic pain of both shoulders; Chronic prescription benzodiazepine use; and Chronic pain syndrome on his problem list. Today he comes in for evaluation of his Knee Pain (bilateral); Shoulder Pain (bilateral); and Back Pain (low)  Pain Assessment: Location: Left, Right Knee Radiating:   Onset: More than a month ago Duration: Chronic pain Quality: Aching Severity: 8 /10 (self-reported pain score)  Note: Reported level is compatible with observation.                   When using our objective Pain Scale, levels between 6 and 10/10 are said to belong in an emergency room, as it progressively worsens from a 6/10, described as severely limiting, requiring emergency care not usually available at an outpatient pain management facility. At a 6/10 level, communication becomes difficult and requires great effort. Assistance  to reach the emergency department may be required. Facial flushing and profuse sweating along with potentially dangerous increases in heart rate and blood pressure will be evident. Effect on ADL:   Timing: Constant Modifying factors: oxycodone  Onset and Duration: Gradual and Date of onset: 15 years ago. Cause of pain: Unknown Severity: Getting worse, NAS-11 at its worse: 10/10, NAS-11 at its best: 5/10, NAS-11 now: 5/10 and NAS-11 on the average: 8/10 Timing: Morning, Night and During activity or exercise Aggravating Factors: Bending, Prolonged sitting, Prolonged standing, Walking, Walking uphill, Walking downhill and Working Alleviating Factors: Lying down, Medications and Standing Associated Problems: Depression, Dizziness, Erectile dysfunction, Fatigue, Sadness, Swelling, Weakness and Pain that wakes patient up Quality of Pain: Agonizing, Exhausting, Feeling of constriction, Nagging, Sharp and Uncomfortable Previous Examinations or Tests: X-rays Previous Treatments: Narcotic medications  The patient comes into the clinics today for the first time for a chronic pain management evaluation.  71 year old male with a history of coronary artery disease, hypertension, hyperlipidemia who presents with bilateral shoulder pain, bilateral knee pain and axial low back pain that occasionally radiates into the posterior thighs and buttocks.patient states that he has been on chronic opioid therapy for over 15 years primarily for his shoulder pain. He has had shoulder surgery in the past. Endorses very severe shoulder pain that limits his activities of daily living with right shoulder being worse. Hx of left shoulder RC repair. Previously used to obtain 180 tablets of Percocet but then total tablets were decreased 120 a month because of falls. Most recent fall was back in 2016 where he had a fracture of his distal styloid. This was a mechanical fall when the patient tripped over  [place]  Patient states that he has been on Xanax and trazodone since death of his wife. Patient states that he rarely uses Xanax however he has had monthly Xanax refills over the last year.  In regards to medications, patient is on Xanax 1 mg up to 4 times a day as needed for anxiety, aspirin 325, Celebrex 100 mg twice daily, ibuprofen 800 mg 3 times a day when necessary, oxycodone 10 mg up to 4 times a day as needed for severe pain. Lumbar pine x-ray shows advanced lumbar spondylosis.  Today I took the time to provide the patient with information regarding my pain practice. The patient was informed that my practice is divided into two sections: an interventional pain management section, as well as a completely separate and distinct medication management section. I explained that I have procedure days for my interventional therapies, and evaluation days for follow-ups and medication management. Because of the amount of documentation required during both, they are kept separated. This means that there is the possibility that he may be scheduled for a procedure on one day, and medication management the next. I have also informed him that because of staffing and facility limitations, I no longer take patients for medication management only. To illustrate the reasons for this, I gave the patient the example of surgeons, and how inappropriate it would be to refer a patient to his/her care, just to write for the post-surgical antibiotics on a surgery done by a different surgeon.   Because interventional pain management is my board-certified specialty, the patient was informed that joining my practice means that they are open to any and all interventional therapies. I made it clear that this does not mean that they will be forced to have any procedures done. What this means is that I believe interventional therapies to be essential part of the diagnosis and proper management of chronic pain conditions. Therefore,  patients not interested in these interventional alternatives will be better served under the care of a different practitioner.  The patient was also made aware of my Comprehensive Pain Management Safety Guidelines where by joining my practice, they limit all of their nerve blocks and joint injections to those done by our practice, for as long as we are retained to manage their care.   Historic Controlled Substance Pharmacotherapy Review  PMP and historical list of controlled substances: Percocet 10 mg up to 4 times a day, quantity 120 a month, last fill 05/11/2017.lso on Xanax 1 mg, quantity 90, last fill 05/11/2017.  Highest opioid analgesic regimen found:Percocet 10 mg up to 6 times a day in the past, quantity 180 Most recent opioid analgesic: Percocet 10 mg 4 times a day when necessary MME/day: 77m/day Medications: The patient did not bring the medication(s) to the appointment, as requested in our "New Patient Package" Pharmacodynamics: Desired effects: Analgesia: The patient reports >50% benefit. Reported improvement in function: The patient reports medication allows him to accomplish basic ADLs. Clinically meaningful improvement in function (CMIF): Sustained CMIF goals met Perceived effectiveness: Described as relatively effective, allowing for increase in activities of daily living (ADL) Undesirable effects: Side-effects or Adverse reactions: None reported Historical Monitoring: The patient  reports that he does not use drugs. List of all UDS Test(s): No results found for: MDMA, COCAINSCRNUR, PCPSCRNUR, PCPQUANT, CANNABQUANT, THCU, EAsharokenList of all Serum Drug Screening Test(s):  No results found for: AMPHSCRSER, BARBSCRSER, BENZOSCRSER, COCAINSCRSER, PCPSCRSER, PCPQUANT, THCSCRSER, CANNABQUANT, OPIATESCRSER, OXYSCRSER, PROPOXSCRSER Historical Background Evaluation: [place]  PMP: Six (6) year initial data search conducted.             PMP NARX Score Report:  Narcotic: 510 Sedative:  531 Stimulant: 0 [place] Department of public safety, offender search: Editor, commissioning Information) Non-contributory Risk Assessment Profile: Aberrant behavior: None observed or detected today Risk factors for fatal opioid overdose: caucasian, concomitant use of Benzodiazepines and None identified today PMP NARX Overdose Risk Score: 370 Fatal overdose hazard ratio (HR): Calculation deferred Non-fatal overdose hazard ratio (HR): Calculation deferred Risk of opioid abuse or dependence: 0.7-3.0% with doses ? 36 MME/day and 6.1-26% with doses ? 120 MME/day. Substance use disorder (SUD) risk level: Pending results of Medical Psychology Evaluation for SUD Opioid risk tool (ORT) (Total Score): 1     Opioid Risk Tool - 06/09/17 1329      Family History of Substance Abuse   Alcohol Negative   Illegal Drugs Negative   Rx Drugs Negative     Personal History of Substance Abuse   Alcohol Negative   Illegal Drugs Negative   Rx Drugs Negative     Age   Age between 39-45 years  No     History of Preadolescent Sexual Abuse   History of Preadolescent Sexual Abuse Negative or Male     Psychological Disease   Psychological Disease Negative   Depression Positive     Total Score   Opioid Risk Tool Scoring 1   Opioid Risk Interpretation Low Risk     ORT Scoring interpretation table:  Score <3 = Low Risk for SUD  Score between 4-7 = Moderate Risk for SUD  Score >8 = High Risk for Opioid Abuse   Score 20-27 = Severe depression (2.4 times higher risk of SUD and 2.89 times higher risk of overuse)   Pharmacologic Plan: Pending ordered tests and/or consults            Initial impression: Pending review of available data and ordered tests.  Meds   Current Outpatient Prescriptions:  .  aspirin 325 MG tablet, Take 325 mg by mouth every other day. , Disp: , Rfl:  .  carvedilol (COREG) 6.25 MG tablet, Take 12.5 mg by mouth every other day. , Disp: , Rfl:  .  Cholecalciferol (VITAMIN D3) 5000 units CAPS,  Take 1 capsule by mouth every other day. , Disp: , Rfl:  .  rosuvastatin (CRESTOR) 20 MG tablet, Take 1 tablet (20 mg total) by mouth at bedtime., Disp: 90 tablet, Rfl: 3 .  traZODone (DESYREL) 100 MG tablet, Take 100 mg by mouth at bedtime. , Disp: , Rfl:  .  amLODipine (NORVASC) 5 MG tablet, Take 5 mg by mouth every other day. , Disp: , Rfl: 12 .  oxyCODONE-acetaminophen (PERCOCET) 10-325 MG tablet, Take 1 tablet by mouth every 4 (four) hours as needed for pain. (Patient not taking: Reported on 06/09/2017), Disp: 30 tablet, Rfl: 0 .  tiZANidine (ZANAFLEX) 4 MG tablet, Take 0.5 tablets (2 mg total) by mouth 2 (two) times daily as needed for muscle spasms., Disp: 30 tablet, Rfl: 0  Imaging Review    Lumbar DG (Complete) 4+V:  Results for orders placed during the hospital encounter of 02/08/15  DG Lumbar Spine Complete   Narrative CLINICAL DATA:  Initial encounter for fall on 01/27/2015. Fall again on 01/31/2015.  EXAM: LUMBAR SPINE - COMPLETE 4+ VIEW  COMPARISON:  None.  FINDINGS: Five lumbar type vertebral bodies. Convex left lumbar spine curvature. Left abdominal calcification projects in the region of the  lower pole left kidney. Cholecystectomy clips. Sacroiliac joints are symmetric. Aortic atherosclerosis. Maintenance of vertebral body height and alignment. Advanced lumbosacral spondylosis, with loss of intervertebral disc height and facet arthropathy at multiple levels.  IMPRESSION: Advanced lumbosacral spondylosis, without acute osseous finding.  Atherosclerosis.  Possible left nephrolithiasis.   Electronically Signed   By: Abigail Miyamoto M.D.   On: 02/08/2015 12:41       Results for orders placed during the hospital encounter of 02/08/15  DG Knee Complete 4 Views Left   Narrative CLINICAL DATA:  Initial encounter for repeated falls.  Pain.  EXAM: LEFT KNEE - COMPLETE 4+ VIEW  COMPARISON:  None.  FINDINGS: Moderate medial and patellofemoral, mild lateral  compartment joint space narrowing osteophyte formation. Extensive vascular calcifications. Surgical changes medial to the distal femur. Suspect a moderate suprapatellar joint effusion; evaluation degraded by patient body habitus. No acute fracture or dislocation.  IMPRESSION: Advanced osteoarthritis.  Suprapatellar joint effusion.  Atherosclerosis.   Electronically Signed   By: Abigail Miyamoto M.D.   On: 02/08/2015 12:43    Knee-R DG Arthrogram: No results found for this or any previous visit. Knee-L DG Arthrogram: No results found for this or any previous visit.  Complexity Note: Imaging results reviewed. Results shared with Mr. Fritchman, using Layman's terms.                         ROS  Cardiovascular History: Daily Aspirin intake, High blood pressure, Heart surgery and Blood thinners:  Antiplatelet Pulmonary or Respiratory History: No reported pulmonary signs or symptoms such as wheezing and difficulty taking a deep full breath (Asthma), difficulty blowing air out (Emphysema), coughing up mucus (Bronchitis), persistent dry cough, or temporary stoppage of breathing during sleep Neurological History: No reported neurological signs or symptoms such as seizures, abnormal skin sensations, urinary and/or fecal incontinence, being born with an abnormal open spine and/or a tethered spinal cord Review of Past Neurological Studies: No results found for this or any previous visit. Psychological-Psychiatric History: No reported psychological or psychiatric signs or symptoms such as difficulty sleeping, anxiety, depression, delusions or hallucinations (schizophrenial), mood swings (bipolar disorders) or suicidal ideations or attempts Gastrointestinal History: No reported gastrointestinal signs or symptoms such as vomiting or evacuating blood, reflux, heartburn, alternating episodes of diarrhea and constipation, inflamed or scarred liver, or pancreas or irrregular and/or infrequent bowel  movements Genitourinary History: Passing kidney stones Hematological History: Brusing easily and Bleeding easily Endocrine History: No reported endocrine signs or symptoms such as high or low blood sugar, rapid heart rate due to high thyroid levels, obesity or weight gain due to slow thyroid or thyroid disease Rheumatologic History: No reported rheumatological signs and symptoms such as fatigue, joint pain, tenderness, swelling, redness, heat, stiffness, decreased range of motion, with or without associated rash Musculoskeletal History: Negative for myasthenia gravis, muscular dystrophy, multiple sclerosis or malignant hyperthermia Work History: Retired  Allergies  Mr. Belvedere has No Known Allergies.  Laboratory Chemistry  Inflammation Markers (CRP: Acute Phase) (ESR: Chronic Phase) No results found for: CRP, ESRSEDRATE               Renal Function Markers Lab Results  Component Value Date   BUN 12 05/06/2017   CREATININE 1.45 (H) 05/06/2017                 Hepatic Function Markers Lab Results  Component Value Date   AST 12 05/06/2017   ALT 13 05/06/2017  Electrolytes Lab Results  Component Value Date   NA 141 05/06/2017   K 4.9 05/06/2017   CL 102 05/06/2017   CALCIUM 9.6 05/06/2017                 Neuropathy Markers No results found for: [id number]               Bone Pathology Markers Lab Results  Component Value Date   VD25OH 47 05/06/2017   CALCIUM 9.6 05/06/2017                 Coagulation Parameters Lab Results  Component Value Date   PLT 178 05/06/2017                 Cardiovascular Markers Lab Results  Component Value Date   HGB 17.2 (H) 05/06/2017   HCT 51.4 (H) 05/06/2017                 Note: Lab results reviewed.  [place]  Drug: Mr. Peatross  reports that he does not use drugs. Alcohol:  reports that he drinks alcohol. Tobacco:  reports that he has been smoking Cigarettes.  He has a 57.00 pack-year smoking history. He has never  used smokeless tobacco. Medical:  has a past medical history of Coronary atherosclerosis of native coronary artery; HLD (hyperlipidemia); Hypertension; and Tobacco user. Family: family history includes Cerebral aneurysm in his father; Heart attack in his mother.  Past Surgical History:  Procedure Laterality Date  . APPENDECTOMY     age 83  . CHOLECYSTECTOMY  2003  . CORONARY ARTERY BYPASS GRAFT  2008   LIMA to LAD, SVG to ramus, SVG to PDA and PLA  . FOOT SURGERY    . KNEE ARTHROPLASTY    . ROTATOR CUFF REPAIR    . SHOULDER SURGERY     Active Ambulatory Problems    Diagnosis Date Noted  . HYPERLIPIDEMIA-MIXED 06/02/2009  . 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 Essential hypertension, benign 07/11/2009  . Coronary atherosclerosis of native coronary artery 06/02/2009  . Hypertension   . Osteoarthritis of both shoulders 06/09/2017  . Lumbar spondylosis 06/09/2017  . Lumbar degenerative disc disease 06/09/2017  . Chronic pain of both shoulders 06/09/2017  . Chronic prescription benzodiazepine use 06/09/2017  . Chronic pain syndrome 06/09/2017   Resolved Ambulatory Problems    Diagnosis Date Noted  . ABDOMINAL PAIN OTHER SPECIFIED SITE 03/08/2010   Past Medical History:  Diagnosis Date  . Coronary atherosclerosis of native coronary artery   . HLD (hyperlipidemia)   . Hypertension   . Tobacco user    Constitutional Exam  General appearance: Well nourished, well developed, and well hydrated. In no apparent acute distress Vitals:   06/09/17 1321  BP: 130/71  Pulse: 81  Resp: 18  Temp: 98 F (36.7 C)  SpO2: 95%  Weight: 245 lb (111.1 kg)  Height: '5\' 11"'$  (1.803 m)   BMI Assessment: Estimated body mass index is 34.17 kg/m as calculated from the following:   Height as of this encounter: '5\' 11"'$  (1.803 m).   Weight as of this encounter: 245 lb (111.1 kg).  BMI interpretation table: BMI level Category Range association with higher incidence of chronic pain  <18 kg/m2  Underweight   18.5-24.9 kg/m2 Ideal body weight   25-29.9 kg/m2 Overweight Increased incidence by 20%  30-34.9 kg/m2 Obese (Class I) Increased incidence by 68%  35-39.9 kg/m2 Severe obesity (Class II) Increased incidence by 136%  >40 kg/m2 Extreme obesity (  Class III) Increased incidence by 254%   BMI Readings from Last 4 Encounters:  06/09/17 34.17 kg/m  05/06/17 34.87 kg/m  08/07/16 36.96 kg/m  01/30/16 35.29 kg/m   Wt Readings from Last 4 Encounters:  06/09/17 245 lb (111.1 kg)  05/06/17 250 lb (113.4 kg)  08/07/16 265 lb (120.2 kg)  01/30/16 253 lb (114.8 kg)  Psych/Mental status: Alert, oriented x 3 (person, place, & time)       Eyes: PERLA Respiratory: No evidence of acute respiratory distress  Cervical Spine Area Exam  Skin & Axial Inspection: No masses, redness, edema, swelling, or associated skin lesions Alignment: Symmetrical Functional ROM: Unrestricted ROM      Stability: No instability detected Muscle Tone/Strength: Functionally intact. No obvious neuro-muscular anomalies detected. Sensory (Neurological): Unimpaired Palpation: No palpable anomalies              Upper Extremity (UE) Exam    Side: Right upper extremity  Side: Left upper extremity  Skin & Extremity Inspection: Skin color, temperature, and hair growth are WNL. No peripheral edema or cyanosis. No masses, redness, swelling, asymmetry, or associated skin lesions. No contractures.  Skin & Extremity Inspection: Skin color, temperature, and hair growth are WNL. No peripheral edema or cyanosis. No masses, redness, swelling, asymmetry, or associated skin lesions. No contractures.  Functional ROM: Unrestricted ROM          Functional ROM: Unrestricted ROM          Muscle Tone/Strength: Functionally intact. No obvious neuro-muscular anomalies detected.  Muscle Tone/Strength: Functionally intact. No obvious neuro-muscular anomalies detected.  Sensory (Neurological): Arthropathic arthralgia          Sensory  (Neurological): Unimpaired          Palpation: Complains of area being tender to palpation              Palpation: No palpable anomalies              Specialized Test(s): Deferred         Specialized Test(s): Deferred         Limited right shoulder abduction Thoracic Spine Area Exam  Skin & Axial Inspection: No masses, redness, or swelling Alignment: Symmetrical Functional ROM: Unrestricted ROM Stability: No instability detected Muscle Tone/Strength: Functionally intact. No obvious neuro-muscular anomalies detected. Sensory (Neurological): Unimpaired Muscle strength & Tone: No palpable anomalies  Lumbar Spine Area Exam  Skin & Axial Inspection: No masses, redness, or swelling Alignment: Symmetrical Functional ROM: Unrestricted ROM      Stability: No instability detected Muscle Tone/Strength: Functionally intact. No obvious neuro-muscular anomalies detected. Sensory (Neurological): Unimpaired Palpation: No palpable anomalies       Provocative Tests: Lumbar Hyperextension and rotation test: Positive bilaterally for facet joint pain. Lumbar Lateral bending test: Positive ipsilateral radicular pain, bilaterally. Positive for bilateral foraminal stenosis. Patrick's Maneuver: Positive for bilateral S-I arthralgia              Gait & Posture Assessment  Ambulation: Limited Gait: Antalgic Posture: WNL   Lower Extremity Exam    Side: Right lower extremity  Side: Left lower extremity  Skin & Extremity Inspection: Evidence of prior arthroplastic surgery  Skin & Extremity Inspection: Skin color, temperature, and hair growth are WNL. No peripheral edema or cyanosis. No masses, redness, swelling, asymmetry, or associated skin lesions. No contractures.  Functional ROM: Decreased ROM          Functional ROM: Decreased ROM          Muscle Tone/Strength:  Functionally intact. No obvious neuro-muscular anomalies detected.  Muscle Tone/Strength: Functionally intact. No obvious neuro-muscular anomalies  detected.  Sensory (Neurological): Arthropathic arthralgia  Sensory (Neurological): Arthropathic arthralgia  Palpation: Tender  Palpation: Complains of area being tender to palpation   Assessment  Primary Diagnosis & Pertinent Problem List: The primary encounter diagnosis was Osteoarthritis of both shoulders, unspecified osteoarthritis type. Diagnoses of Lumbar spondylosis, Lumbar degenerative disc disease, Chronic pain of both shoulders, Chronic prescription benzodiazepine use, Chronic pain syndrome, and Primary osteoarthritis of both knees were also pertinent to this visit.  Visit Diagnosis (New problems to examiner): 1. Osteoarthritis of both shoulders, unspecified osteoarthritis type   2. Lumbar spondylosis   3. Lumbar degenerative disc disease   4. Chronic pain of both shoulders   5. Chronic prescription benzodiazepine use   6. Chronic pain syndrome   7. Primary osteoarthritis of both knees    General Recommendations: The pain condition that the patient suffers from is best treated with a multidisciplinary approach that involves an increase in physical activity to prevent de-conditioning and worsening of the pain cycle, as well as psychological counseling (formal and/or informal) to address the co-morbid psychological affects of pain. Treatment will often involve judicious use of pain medications and interventional procedures to decrease the pain, allowing the patient to participate in the physical activity that will ultimately produce long-lasting pain reductions. The goal of the multidisciplinary approach is to return the patient to a higher level of overall function and to restore their ability to perform activities of daily living.  71 year old male who presents with bilateral shoulder pain (history of left shoulder rotator cuff repair, told that he needs to have his right shoulder surgically repaired but wants to avoid), bilateral knee pain secondary to advanced osteoarthritis, and axial  low back pain secondary to lumbar degenerative disc disease and lumbar spondylosis. Of note patient was previously seeing primary care physician who is managing him on Percocet 10 mg up to 4 times a day as needed for severe pain along with Xanax 1 mg up to 3 times a day as needed for anxiety. I had an extensive discussion about my concerns of concomitant opioid and benzodiazepine use and how this could increase his risk of respiratory depression. I informed the patient that he would not be a candidate for opioid therapy if he wishes to continue Xanax. Patient states that he does not have any additional refills on this medication and does not plan to continue this. He finds it very difficult to cope with the loss of his wife that happened in 2017. He lives alone with limited social support.  Patient states that he will wean off of his Xanax medication and will not continuing any further. Future urine drug screens should be negative for this. Future [facility] Hospital PMP review should not show any additional Xanax fills. If it does, I informed the patient that this would be grounds to wean and subsequently discontinue his opioid therapy from this clinic. Patient endorsed understanding.Consideration of opioid therapy, I will obtain urine drug screen today which be positive for oxycodoneand its metabolites along with Xanax. I will also send the patient for pain psychology evaluation for substance abuse disorder which I believe the patient is low risk for. Patient does endorse muscle spasms which I will prescribe him tizanidine for. I have cautioned him that this medication can cause sedation.  In regards to interventional therapies, we discussed intra-articular Hyalgan injections for advanced knee osteoarthritis. For the patient's right shoulder pain, we discussed  posterior shoulder joint injection with steroid therapy. Patient is on full strength aspirin at 325 mg. I told him to decrease his dose to 81 mg 3 days prior  to scheduled procedure. Risks and benefits were discussed. Patient did not have any further questions or concerns.  Plan: -Urine drug screen today. Should be positive for Xanax and hydrocodone. Subsequent urine drug screen should be negative for Xanax. -Referral to pain psychology for evaluation of substance abuse disorder. Patient is deemed to be low risk based upon my clinical assessment -Discontinue Xanax. -tizanidine 2 mg twice a day when necessary muscle spasms -Right intra-articular shoulder joint injection -Bilateral intra-articular Hyalgan knee injections. -patient instructed to stop full dose aspirin 325 mg 3 days prior to procedure and transition to 81 mg aspirin which she can continue for the procedure. Patient has a history of four-vessel CABG in 2008.  Note: Please be advised that as per protocol, today's visit has been an evaluation only. We have not taken over the patient's controlled substance management.  Ordered Lab-work, Procedure(s), Referral(s), & Consult(s): Orders Placed This Encounter  Procedures  . KNEE INJECTION  . SHOULDER INJECTION  . Compliance Drug Analysis, Ur  . Ambulatory referral to Psychology   Pharmacotherapy (current): Medications ordered:  Meds ordered this encounter  Medications  . tiZANidine (ZANAFLEX) 4 MG tablet    Sig: Take 0.5 tablets (2 mg total) by mouth 2 (two) times daily as needed for muscle spasms.    Dispense:  30 tablet    Refill:  0   Medications administered during this visit: Mr. Dubey had no medications administered during this visit.   Pharmacological management options:  Opioid Analgesics: The patient was informed that there is no guarantee that he would be a candidate for opioid analgesics. The decision will be made following CDC guidelines. This decision will be based on the results of diagnostic studies, as well as Mr. Capitano's risk profile.   Membrane stabilizer: To be determined at a later time  Muscle relaxant: To  be determined at a later time  NSAID: To be determined at a later time  Other analgesic(s): To be determined at a later time   Interventional management options: Mr. Marcucci was informed that there is no guarantee that he would be a candidate for interventional therapies. The decision will be based on the results of diagnostic studies, as well as Mr. Winget's risk profile.  Procedure(s) under consideration:  -bilateral intra-articular halogen injections -Right posterior shoulder joint injection -Lumbar medial branch nerve blocks -SI joint injection   Provider-requested follow-up: Return for Procedure, After Psychological evaluation.  Future Appointments Date Time Provider [place]  08/05/2017 8:40 AM Caren Macadam, MD [facility]  08/07/2017 9:20 AM Satira Sark, MD [facility]    Primary Care Physician: Caren Macadam, MD Location: [facility] Outpatient Pain Management Facility Note by: [name], M.D, Date: 06/09/2017; Time: 4:09 PM  Patient Instructions  1. UDS today. Should be positive for oxycodone and Xanax. We will obtain another urine drug screen at the next visit, I expect this to be negative for Xanax and positive for oxycodone.  2. Referral to pain psychology. Evaluation needs to be completed before we can take over opioid therapy.  3. Please discontinue Xanax. I will not be able to provide you with opioid therapy if you continue Xanax.  4. Tizanidine 2 mg twice a day when necessary muscle spasms  5. Bilateral intra-articular Hyalgan knee injection, right intra-articular steroid injection.  ** patient to stop full  dose aspirin 3 days prior to procedure and change to 81 mg aspirin.**    DO NOT EAT OR DRINK FOR 3 HOURS PRIOR TO PROCEDURE.   3 DAYS PRIOR TO PROCEDURE, STOP ASPIRIN 325 MG AND SWITCH TO ASPRIN 81 MG.   TAKE BLOOD PRESSURE MEDICATION THE MORNING OF PROCEDURE WITH A TINY SIP OF WATER.   PICK UP YOUR tIZANIDINE FROM PHARMACY

## 2017-06-14 LAB — COMPLIANCE DRUG ANALYSIS, UR

## 2017-06-15 ENCOUNTER — Ambulatory Visit
Payer: Medicare HMO | Attending: Student in an Organized Health Care Education/Training Program | Admitting: Student in an Organized Health Care Education/Training Program

## 2017-06-15 ENCOUNTER — Encounter: Payer: Self-pay | Admitting: Student in an Organized Health Care Education/Training Program

## 2017-06-15 VITALS — BP 142/73 | HR 97 | Temp 97.5°F | Resp 20 | Ht 71.0 in | Wt 240.0 lb

## 2017-06-15 DIAGNOSIS — M19012 Primary osteoarthritis, left shoulder: Secondary | ICD-10-CM | POA: Insufficient documentation

## 2017-06-15 DIAGNOSIS — Z9889 Other specified postprocedural states: Secondary | ICD-10-CM | POA: Diagnosis not present

## 2017-06-15 DIAGNOSIS — G894 Chronic pain syndrome: Secondary | ICD-10-CM | POA: Insufficient documentation

## 2017-06-15 DIAGNOSIS — M25511 Pain in right shoulder: Secondary | ICD-10-CM | POA: Diagnosis not present

## 2017-06-15 DIAGNOSIS — Z951 Presence of aortocoronary bypass graft: Secondary | ICD-10-CM | POA: Insufficient documentation

## 2017-06-15 DIAGNOSIS — M19011 Primary osteoarthritis, right shoulder: Secondary | ICD-10-CM | POA: Diagnosis not present

## 2017-06-15 DIAGNOSIS — Z9049 Acquired absence of other specified parts of digestive tract: Secondary | ICD-10-CM | POA: Insufficient documentation

## 2017-06-15 DIAGNOSIS — M25512 Pain in left shoulder: Secondary | ICD-10-CM | POA: Diagnosis not present

## 2017-06-15 DIAGNOSIS — G8929 Other chronic pain: Secondary | ICD-10-CM

## 2017-06-15 DIAGNOSIS — M170 Bilateral primary osteoarthritis of knee: Secondary | ICD-10-CM

## 2017-06-15 MED ORDER — ROPIVACAINE HCL 2 MG/ML IJ SOLN
10.0000 mL | Freq: Once | INTRAMUSCULAR | Status: AC
  Administered 2017-06-15: 10 mL
  Filled 2017-06-15: qty 10

## 2017-06-15 MED ORDER — SODIUM HYALURONATE (VISCOSUP) 20 MG/2ML IX SOSY
2.0000 mL | PREFILLED_SYRINGE | Freq: Once | INTRA_ARTICULAR | Status: AC
  Administered 2017-06-15: 2 mL via INTRA_ARTICULAR
  Filled 2017-06-15: qty 2

## 2017-06-15 MED ORDER — SODIUM HYALURONATE (VISCOSUP) 20 MG/2ML IX SOSY
2.0000 mL | PREFILLED_SYRINGE | Freq: Once | INTRA_ARTICULAR | Status: AC
  Administered 2017-06-15: 11:00:00 via INTRA_ARTICULAR
  Filled 2017-06-15: qty 2

## 2017-06-15 MED ORDER — DEXAMETHASONE SODIUM PHOSPHATE 10 MG/ML IJ SOLN
10.0000 mg | Freq: Once | INTRAMUSCULAR | Status: AC
  Administered 2017-06-15: 10 mg
  Filled 2017-06-15: qty 1

## 2017-06-15 NOTE — Progress Notes (Signed)
Safety precautions to be maintained throughout the outpatient stay will include: orient to surroundings, keep bed in low position, maintain call bell within reach at all times, provide assistance with transfer out of bed and ambulation.  

## 2017-06-15 NOTE — Progress Notes (Signed)
Patient's Name: Jack [name]  MRN: [id number]  Referring Provider: [name], MD  DOB: 20-Dec-1945  PCP: Caren Macadam, MD  DOS: 06/15/2017  Note by: [name], MD  Service setting: Ambulatory outpatient  Specialty: Interventional Pain Management  Patient type: Established  Location: [facility] (AMB) Pain Management Facility  Visit type: Interventional Procedure   Primary Reason for Visit: Interventional Pain Management Treatment. CC: Knee Pain (bilateral, right hurts a little more than the left.  ) and Shoulder Pain (bilateral)  Procedure:  Anesthesia, Analgesia, Anxiolysis:  Type: Diagnostic Intra-Articular Hyalgan Knee Injection Region: Lateral infrapatellar Knee Region Level: Knee Joint Laterality: Bilateral  Type: Local Anesthesia Local Anesthetic: Lidocaine 1% Route: Infiltration ([place]/IM) IV Access: Declined Sedation: Declined  Indication(s): Analgesia           Indications: 1. Osteoarthritis of both shoulders, unspecified osteoarthritis type   2. Primary osteoarthritis of both knees   3. Chronic pain syndrome   4. Chronic pain of both shoulders    Pain Score: Pre-procedure: 8 /10 Post-procedure: 0-No pain (shoulder pain, 4)/10  Pre-op Assessment:  Jack [name] is a 71 y.o. (year old), male patient, seen today for interventional treatment. He  has a past surgical history that includes Foot surgery; Appendectomy; Knee Arthroplasty; Coronary artery bypass graft (2008); Rotator cuff repair; Shoulder surgery; and Cholecystectomy (2003). Jack [name] has a current medication list which includes the following prescription(s): amlodipine, aspirin, carvedilol, vitamin d3, rosuvastatin, tizanidine, trazodone, and oxycodone-acetaminophen, and the following Facility-Administered Medications: sodium hyaluronate and sodium hyaluronate. His primarily concern today is the Knee Pain (bilateral, right hurts a little more than the left.  ) and Shoulder Pain (bilateral)  Initial Vital Signs: Blood  pressure (!) 153/76, pulse 80, temperature (!) 97.5 F (36.4 C), temperature source Oral, resp. rate 20, height 5\' 11"  (1.803 m), weight 240 lb (108.9 kg), SpO2 97 %. BMI: Estimated body mass index is 33.47 kg/m as calculated from the following:   Height as of this encounter: 5\' 11"  (1.803 m).   Weight as of this encounter: 240 lb (108.9 kg).  Risk Assessment: Allergies: Reviewed. He has No Known Allergies.  Allergy Precautions: None required Coagulopathies: Reviewed. None identified.  Blood-thinner therapy: None at this time Active Infection(s): Reviewed. None identified. Jack [name] is afebrile  Site Confirmation: Jack [name] was asked to confirm the procedure and laterality before marking the site Procedure checklist: Completed Consent: Before the procedure and under the influence of no sedative(s), amnesic(s), or anxiolytics, the patient was informed of the treatment options, risks and possible complications. To fulfill our ethical and legal obligations, as recommended by the American Medical Association's Code of Ethics, I have informed the patient of my clinical impression; the nature and purpose of the treatment or procedure; the risks, benefits, and possible complications of the intervention; the alternatives, including doing nothing; the risk(s) and benefit(s) of the alternative treatment(s) or procedure(s); and the risk(s) and benefit(s) of doing nothing. The patient was provided information about the general risks and possible complications associated with the procedure. These may include, but are not limited to: failure to achieve desired goals, infection, bleeding, organ or nerve damage, allergic reactions, paralysis, and death. In addition, the patient was informed of those risks and complications associated to the procedure, such as failure to decrease pain; infection; bleeding; organ or nerve damage with subsequent damage to sensory, motor, and/or autonomic systems, resulting in  permanent pain, numbness, and/or weakness of one or several areas of the body; allergic reactions; (i.e.: anaphylactic reaction); and/or  death. Furthermore, the patient was informed of those risks and complications associated with the medications. These include, but are not limited to: allergic reactions (i.e.: anaphylactic or anaphylactoid reaction(s)); adrenal axis suppression; blood sugar elevation that in diabetics may result in ketoacidosis or comma; water retention that in patients with history of congestive heart failure may result in shortness of breath, pulmonary edema, and decompensation with resultant heart failure; weight gain; swelling or edema; medication-induced neural toxicity; particulate matter embolism and blood vessel occlusion with resultant organ, and/or nervous system infarction; and/or aseptic necrosis of one or more joints. Finally, the patient was informed that Medicine is not an exact science; therefore, there is also the possibility of unforeseen or unpredictable risks and/or possible complications that may result in a catastrophic outcome. The patient indicated having understood very clearly. We have given the patient no guarantees and we have made no promises. Enough time was given to the patient to ask questions, all of which were answered to the patient's satisfaction. Jack [name] has indicated that he wanted to continue with the procedure. Attestation: I, the ordering provider, attest that I have discussed with the patient the benefits, risks, side-effects, alternatives, likelihood of achieving goals, and potential problems during recovery for the procedure that I have provided informed consent. Date: 06/15/2017; Time: 9:16 AM  Pre-Procedure Preparation:  Monitoring: As per clinic protocol. Respiration, ETCO2, SpO2, BP, heart rate and rhythm monitor placed and checked for adequate function Safety Precautions: Patient was assessed for positional comfort and pressure points  before starting the procedure. Time-out: I initiated and conducted the "Time-out" before starting the procedure, as per protocol. The patient was asked to participate by confirming the accuracy of the "Time Out" information. Verification of the correct person, site, and procedure were performed and confirmed by me, the nursing staff, and the patient. "Time-out" conducted as per Joint Commission's Universal Protocol (UP.01.01.01). "Time-out" Date & Time: 06/15/2017; 0924 hrs.  Description of Procedure Process:   Position: Sitting Target Area: Knee Joint Approach: Just above the Lateral tibial plateau, lateral to the infrapatellar tendon. Area Prepped: Entire knee area, from the mid-thigh to the mid-shin. Prepping solution: ChloraPrep (2% chlorhexidine gluconate and 70% isopropyl alcohol) Safety Precautions: Aspiration looking for blood return was conducted prior to all injections. At no point did we inject any substances, as a needle was being advanced. No attempts were made at seeking any paresthesias. Safe injection practices and needle disposal techniques used. Medications properly checked for expiration dates. SDV (single dose vial) medications used. Description of the Procedure: Protocol guidelines were followed. The patient was placed in position over the fluoroscopy table. The target area was identified and the area prepped in the usual manner. Skin desensitized using vapocoolant spray. Skin & deeper tissues infiltrated with local anesthetic. Appropriate amount of time allowed to pass for local anesthetics to take effect. The procedure needles were then advanced to the target area. Proper needle placement secured. Negative aspiration confirmed. Solution injected in intermittent fashion, asking for systemic symptoms every 0.5cc of injectate. The needles were then removed and the area cleansed, making sure to leave some of the prepping solution back to take advantage of its long term bactericidal  properties. Vitals:   06/15/17 0856 06/15/17 0940  BP: (!) 153/76 (!) 142/73  Pulse: 80 97  Resp: 20 20  Temp: (!) 97.5 F (36.4 C)   TempSrc: Oral   SpO2: 97% 97%  Weight: 240 lb (108.9 kg)   Height: 5\' 11"  (1.803 m)  Start Time:  (shoulder start time, 0931) hrs. End Time: 0937 hrs. Materials:  Needle(s) Type: Regular needle Gauge: 22G Length: 3.5-in Medication(s): We administered ropivacaine (PF) 2 mg/mL (0.2%) and dexamethasone. Please see chart orders for dosing details. 2 cc of Hyalgan (20 mg total) in each knee. Imaging Guidance:  Type of Imaging Technique: None used Indication(s): N/A Exposure Time: No patient exposure Contrast: None used. Fluoroscopic Guidance: N/A Ultrasound Guidance: N/A Interpretation: N/A  Antibiotic Prophylaxis:  Indication(s): None identified Antibiotic given: None  Post-operative Assessment:  EBL: None Complications: No immediate post-treatment complications observed by team, or reported by patient. Note: The patient tolerated the entire procedure well. A repeat set of vitals were taken after the procedure and the patient was kept under observation following institutional policy, for this type of procedure. Post-procedural neurological assessment was performed, showing return to baseline, prior to discharge. The patient was provided with post-procedure discharge instructions, including a section on how to identify potential problems. Should any problems arise concerning this procedure, the patient was given instructions to immediately contact us, at any time, without hesitation. In any case, we plan to contact the patient by telephone for a follow-up status report regarding this interventional procedure. Comments:  No additional relevant information. 5 out of 5 strength bilateral lower extremity: Plantar flexion, dorsiflexion, knee flexion, knee extension.  Plan of Care   Imaging Orders  No imaging studies ordered today    Procedure  Orders     KNEE INJECTION  Medications ordered for procedure: Meds ordered this encounter  Medications  . ropivacaine (PF) 2 mg/mL (0.2%) (NAROPIN) injection 10 mL  . dexamethasone (DECADRON) injection 10 mg  . Sodium Hyaluronate SOSY 2 mL  . Sodium Hyaluronate SOSY 2 mL   Medications administered: We administered ropivacaine (PF) 2 mg/mL (0.2%) and dexamethasone.  See the medical record for exact dosing, route, and time of administration.  New Prescriptions   No medications on file   Disposition: Discharge home  Discharge Date & Time: 06/15/2017; 0950 hrs.   Physician-requested Follow-up: Return in about 2 weeks (around 06/29/2017) for Procedure.  Repeat IA hyalgan injection 2/5 at next appointment  Future Appointments Date Time Provider [place]  06/29/2017 8:45 AM [name], MD [facility] None  08/05/2017 8:40 AM Caren Macadam, MD [facility]  08/07/2017 9:20 AM Satira Sark, MD [facility]   Primary Care Physician: Caren Macadam, MD Location: [facility] Outpatient [facility] Management Facility Note by: [name], MD Date: 06/15/2017; Time: 10:27 AM  Disclaimer:  Medicine is not an exact science. The only guarantee in medicine is that nothing is guaranteed. It is important to note that the decision to proceed with this intervention was based on the information collected from the patient. The Data and conclusions were drawn from the patient's questionnaire, the interview, and the physical examination. Because the information was provided in large part by the patient, it cannot be guaranteed that it has not been purposely or unconsciously manipulated. Every effort has been made to obtain as much relevant data as possible for this evaluation. It is important to note that the conclusions that lead to this procedure are derived in large part from the available data. Always take into account that the treatment will also be dependent on availability of resources  and existing treatment guidelines, considered by other Pain Management Practitioners as being common knowledge and practice, at the time of the intervention. For Medico-Legal purposes, it is also important to point out that variation in procedural techniques and  pharmacological choices are the acceptable norm. The indications, contraindications, technique, and results of the above procedure should only be interpreted and judged by a Board-Certified Interventional Pain Specialist with extensive familiarity and expertise in the same exact procedure and technique.

## 2017-06-15 NOTE — Patient Instructions (Signed)
Pain Management Discharge Instructions  General Discharge Instructions :  If you need to reach your doctor call: Monday-Friday 8:00 am - 4:00 pm at [phone] or toll free [phone].  After clinic hours [phone] to have operator reach doctor.  Bring all of your medication bottles to all your appointments in the pain clinic.  To cancel or reschedule your appointment with Pain Management please remember to call 24 hours in advance to avoid a fee.  Refer to the educational materials which you have been given on: General Risks, I had my Procedure. Discharge Instructions, Post Sedation.  Post Procedure Instructions:  The drugs you were given will stay in your system until tomorrow, so for the next 24 hours you should not drive, make any legal decisions or drink any alcoholic beverages.  You may eat anything you prefer, but it is better to start with liquids then soups and crackers, and gradually work up to solid foods.  Please notify your doctor immediately if you have any unusual bleeding, trouble breathing or pain that is not related to your normal pain.  Depending on the type of procedure that was done, some parts of your body may feel week and/or numb.  This usually clears up by tonight or the next day.  Walk with the use of an assistive device or accompanied by an adult for the 24 hours.  You may use ice on the affected area for the first 24 hours.  Put ice in a Ziploc bag and cover with a towel and place against area 15 minutes on 15 minutes off.  You may switch to heat after 24 hours.Knee Injection A knee injection is a procedure to get medicine into your knee joint. Your health care provider puts a needle into the joint and injects medicine with an attached syringe. The injected medicine may relieve the pain, swelling, and stiffness of arthritis. The injected medicine may also help to lubricate and cushion your knee joint. You may need more than one injection. Tell a health  care provider about:  Any allergies you have.  All medicines you are taking, including vitamins, herbs, eye drops, creams, and over-the-counter medicines.  Any problems you or family members have had with anesthetic medicines.  Any blood disorders you have.  Any surgeries you have had.  Any medical conditions you have. What are the risks? Generally, this is a safe procedure. However, problems may occur, including:  Infection.  Bleeding.  Worsening symptoms.  Damage to the area around your knee.  Allergic reaction to any of the medicines.  Skin reactions from repeated injections.  What happens before the procedure?  Ask your health care provider about changing or stopping your regular medicines. This is especially important if you are taking diabetes medicines or blood thinners.  Plan to have someone take you home after the procedure. What happens during the procedure?  You will sit or lie down in a position for your knee to be treated.  The skin over your kneecap will be cleaned with a germ-killing solution (antiseptic).  You will be given a medicine that numbs the area (local anesthetic). You may feel some stinging.  After your knee becomes numb, you will have a second injection. This is the medicine. This needle is carefully placed between your kneecap and your knee. The medicine is injected into the joint space.  At the end of the procedure, the needle will be removed.  A bandage (dressing) may be placed over the injection site. The procedure may   vary among health care providers and hospitals. What happens after the procedure?  You may have to move your knee through its full range of motion. This helps to get all of the medicine into your joint space.  Your blood pressure, heart rate, breathing rate, and blood oxygen level will be monitored often until the medicines you were given have worn off.  You will be watched to make sure that you do not have a reaction  to the injected medicine. This information is not intended to replace advice given to you by your health care provider. Make sure you discuss any questions you have with your health care provider. Document Released: 11/02/2006 Document Revised: 01/11/2016 Document Reviewed: 06/21/2014 Elsevier Interactive Patient Education  2018 [employer]  

## 2017-06-15 NOTE — Progress Notes (Signed)
Patient's Name: Jack [name]  MRN: [id number]  Referring Provider: [name], MD  DOB: [birth year]  PCP: Caren Macadam, MD  DOS: 06/15/2017  Note by: [name], MD  Service setting: Ambulatory outpatient  Specialty: Interventional Pain Management  Patient type: Established  Location: [facility] (AMB) Pain Management Facility  Visit type: Interventional Procedure   Primary Reason for Visit: Interventional Pain Management Treatment. CC: Knee Pain (bilateral, right hurts a little more than the left.  ) and Shoulder Pain (bilateral)  Procedure:  Anesthesia, Analgesia, Anxiolysis:  Type: Diagnostic Glenohumeral Joint (shoulder) Injection Region: Posterolateral Shoulder Area Level: Shoulder Laterality: Right-Sided  Type: Local Anesthesia Local Anesthetic: Lidocaine 1% Route: Infiltration (Hunker/IM) IV Access: Secured Sedation: Meaningful verbal contact was maintained at all times during the procedure  Indication(s): Analgesia and Anxiety   Indications: 1. Osteoarthritis of both shoulders, unspecified osteoarthritis type   2. Primary osteoarthritis of both knees   3. Chronic pain syndrome   4. Chronic pain of both shoulders    Pain Score: Pre-procedure: 8 /10 Post-procedure: 0-No pain (shoulder pain, 4)/10  Pre-op Assessment:  [name] is a 71 y.o. (year old), male patient, seen today for interventional treatment. He  has a past surgical history that includes Foot surgery; Appendectomy; Knee Arthroplasty; Coronary artery bypass graft (2008); Rotator cuff repair; Shoulder surgery; and Cholecystectomy (2003). Mr. Sitar has a current medication list which includes the following prescription(s): amlodipine, aspirin, carvedilol, vitamin d3, rosuvastatin, tizanidine, trazodone, and oxycodone-acetaminophen. His primarily concern today is the Knee Pain (bilateral, right hurts a little more than the left.  ) and Shoulder Pain (bilateral)  Initial Vital Signs: Blood pressure (!) 142/73, pulse 97,  temperature (!) 97.5 F (36.4 C), temperature source Oral, resp. rate 20, height 5\' 11"  (1.803 m), weight 240 lb (108.9 kg), SpO2 97 %. BMI: Estimated body mass index is 33.47 kg/m as calculated from the following:   Height as of this encounter: 5\' 11"  (1.803 m).   Weight as of this encounter: 240 lb (108.9 kg).  Risk Assessment: Allergies: Reviewed. He has No Known Allergies.  Allergy Precautions: None required Coagulopathies: Reviewed. None identified.  Blood-thinner therapy: None at this time Active Infection(s): Reviewed. None identified. [name] is afebrile  Site Confirmation: [name] was asked to confirm the procedure and laterality before marking the site Procedure checklist: Completed Consent: Before the procedure and under the influence of no sedative(s), amnesic(s), or anxiolytics, the patient was informed of the treatment options, risks and possible complications. To fulfill our ethical and legal obligations, as recommended by the American Medical Association's Code of Ethics, I have informed the patient of my clinical impression; the nature and purpose of the treatment or procedure; the risks, benefits, and possible complications of the intervention; the alternatives, including doing nothing; the risk(s) and benefit(s) of the alternative treatment(s) or procedure(s); and the risk(s) and benefit(s) of doing nothing. The patient was provided information about the general risks and possible complications associated with the procedure. These may include, but are not limited to: failure to achieve desired goals, infection, bleeding, organ or nerve damage, allergic reactions, paralysis, and death. In addition, the patient was informed of those risks and complications associated to the procedure, such as failure to decrease pain; infection; bleeding; organ or nerve damage with subsequent damage to sensory, motor, and/or autonomic systems, resulting in permanent pain, numbness, and/or  weakness of one or several areas of the body; allergic reactions; (i.e.: anaphylactic reaction); and/or death. Furthermore, the patient was informed of those  risks and complications associated with the medications. These include, but are not limited to: allergic reactions (i.e.: anaphylactic or anaphylactoid reaction(s)); adrenal axis suppression; blood sugar elevation that in diabetics may result in ketoacidosis or comma; water retention that in patients with history of congestive heart failure may result in shortness of breath, pulmonary edema, and decompensation with resultant heart failure; weight gain; swelling or edema; medication-induced neural toxicity; particulate matter embolism and blood vessel occlusion with resultant organ, and/or nervous system infarction; and/or aseptic necrosis of one or more joints. Finally, the patient was informed that Medicine is not an exact science; therefore, there is also the possibility of unforeseen or unpredictable risks and/or possible complications that may result in a catastrophic outcome. The patient indicated having understood very clearly. We have given the patient no guarantees and we have made no promises. Enough time was given to the patient to ask questions, all of which were answered to the patient's satisfaction. [name] has indicated that he wanted to continue with the procedure. Attestation: I, the ordering provider, attest that I have discussed with the patient the benefits, risks, side-effects, alternatives, likelihood of achieving goals, and potential problems during recovery for the procedure that I have provided informed consent. Date: 06/15/2017; Time: 10:28 AM  Pre-Procedure Preparation:  Monitoring: As per clinic protocol. Respiration, ETCO2, SpO2, BP, heart rate and rhythm monitor placed and checked for adequate function Safety Precautions: Patient was assessed for positional comfort and pressure points before starting the  procedure. Time-out: I initiated and conducted the "Time-out" before starting the procedure, as per protocol. The patient was asked to participate by confirming the accuracy of the "Time Out" information. Verification of the correct person, site, and procedure were performed and confirmed by me, the nursing staff, and the patient. "Time-out" conducted as per Joint Commission's Universal Protocol (UP.01.01.01). "Time-out" Date & Time: 06/15/2017; 0924 hrs.  Description of Procedure Process:   Position: Supine Target Area: Glenohumeral Joint (shoulder) Approach: Posterolateral approach. Area Prepped: Entire shoulder Area Prepping solution: ChloraPrep (2% chlorhexidine gluconate and 70% isopropyl alcohol) Safety Precautions: Aspiration looking for blood return was conducted prior to all injections. At no point did we inject any substances, as a needle was being advanced. No attempts were made at seeking any paresthesias. Safe injection practices and needle disposal techniques used. Medications properly checked for expiration dates. SDV (single dose vial) medications used. Description of the Procedure: Protocol guidelines were followed. The patient was placed in position over the procedure table. The target area was identified and the area prepped in the usual manner. Skin & deeper tissues infiltrated with local anesthetic. Appropriate amount of time allowed to pass for local anesthetics to take effect. The procedure needles were then advanced to the target area. Proper needle placement secured. Negative aspiration confirmed. Solution injected in intermittent fashion, asking for systemic symptoms every 0.5cc of injectate. The needles were then removed and the area cleansed, making sure to leave some of the prepping solution back to take advantage of its long term bactericidal properties. Vitals:   06/15/17 0856 06/15/17 0940  BP: (!) 153/76 (!) 142/73  Pulse: 80 97  Resp: 20 20  Temp: (!) 97.5 F (36.4  C)   TempSrc: Oral   SpO2: 97% 97%  Weight: 240 lb (108.9 kg)   Height: 5\' 11"  (1.803 m)     Start Time:  (shoulder start time, 0931) hrs. End Time: 0937 hrs. Materials:  Needle(s) Type: Regular needle Gauge: 22G Length: 3.5-in Medication(s): We administered ropivacaine (PF)  2 mg/mL (0.2%), dexamethasone, Sodium Hyaluronate, and Sodium Hyaluronate. Please see chart orders for dosing details. 4 cc of 0.2% ropivacaine, 1 cc of Decadron 10 mg/cc. Imaging Guidance (Non-Spinal):  Type of Imaging Technique: Fluoroscopy Guidance (Non-Spinal) Indication(s): Assistance in needle guidance and placement for procedures requiring needle placement in or near specific anatomical locations not easily accessible without such assistance. Exposure Time: Please see nurses notes. Contrast: None used. Fluoroscopic Guidance: I was personally present during the use of fluoroscopy. "Tunnel Vision Technique" used to obtain the best possible view of the target area. Parallax error corrected before commencing the procedure. "Direction-depth-direction" technique used to introduce the needle under continuous pulsed fluoroscopy. Once target was reached, antero-posterior, oblique, and lateral fluoroscopic projection used confirm needle placement in all planes. Images permanently stored in EMR. Interpretation: No contrast injected. I personally interpreted the imaging intraoperatively. Adequate needle placement confirmed in multiple planes. Permanent images saved into the patient's record.  Antibiotic Prophylaxis:  Indication(s): None identified Antibiotic given: None  Post-operative Assessment:  EBL: None Complications: No immediate post-treatment complications observed by team, or reported by patient. Note: The patient tolerated the entire procedure well. A repeat set of vitals were taken after the procedure and the patient was kept under observation following institutional policy, for this type of procedure.  Post-procedural neurological assessment was performed, showing return to baseline, prior to discharge. The patient was provided with post-procedure discharge instructions, including a section on how to identify potential problems. Should any problems arise concerning this procedure, the patient was given instructions to immediately contact us, at any time, without hesitation. In any case, we plan to contact the patient by telephone for a follow-up status report regarding this interventional procedure. Comments:  No additional relevant information. 5 out of 5 strength bilateral upper extremity: Shoulder abduction, elbow flexion, elbow extension, thumb extension.   Plan of Care  Plan for intra-articular Hyalgan injection #2 out of 5 in 2 weeks.  Imaging Orders  No imaging studies ordered today    Procedure Orders     KNEE INJECTION  Medications ordered for procedure: Meds ordered this encounter  Medications  . ropivacaine (PF) 2 mg/mL (0.2%) (NAROPIN) injection 10 mL  . dexamethasone (DECADRON) injection 10 mg  . Sodium Hyaluronate SOSY 2 mL  . Sodium Hyaluronate SOSY 2 mL   Medications administered: We administered ropivacaine (PF) 2 mg/mL (0.2%), dexamethasone, Sodium Hyaluronate, and Sodium Hyaluronate.  See the medical record for exact dosing, route, and time of administration.  New Prescriptions   No medications on file   Disposition: Discharge home  Discharge Date & Time: 06/15/2017; 0950 hrs.   Physician-requested Follow-up: Return in about 2 weeks (around 06/29/2017) for Procedure. Future Appointments Date Time Provider [place]  06/29/2017 8:45 AM [name], MD [facility] None  08/05/2017 8:40 AM Caren Macadam, MD [facility]  08/07/2017 9:20 AM Satira Sark, MD [facility]   Primary Care Physician: Caren Macadam, MD Location: [facility] Facility Note by: [name], MD Date: 06/15/2017; Time: 11:14 AM  Disclaimer:   Medicine is not an exact science. The only guarantee in medicine is that nothing is guaranteed. It is important to note that the decision to proceed with this intervention was based on the information collected from the patient. The Data and conclusions were drawn from the patient's questionnaire, the interview, and the physical examination. Because the information was provided in large part by the patient, it cannot be guaranteed that it has not been purposely or unconsciously  manipulated. Every effort has been made to obtain as much relevant data as possible for this evaluation. It is important to note that the conclusions that lead to this procedure are derived in large part from the available data. Always take into account that the treatment will also be dependent on availability of resources and existing treatment guidelines, considered by other Pain Management Practitioners as being common knowledge and practice, at the time of the intervention. For Medico-Legal purposes, it is also important to point out that variation in procedural techniques and pharmacological choices are the acceptable norm. The indications, contraindications, technique, and results of the above procedure should only be interpreted and judged by a Board-Certified Interventional Pain Specialist with extensive familiarity and expertise in the same exact procedure and technique.

## 2017-06-29 ENCOUNTER — Encounter (HOSPITAL_COMMUNITY): Payer: Self-pay | Admitting: Emergency Medicine

## 2017-06-29 ENCOUNTER — Encounter: Payer: Self-pay | Admitting: Student in an Organized Health Care Education/Training Program

## 2017-06-29 ENCOUNTER — Emergency Department (HOSPITAL_COMMUNITY): Payer: Medicare HMO

## 2017-06-29 ENCOUNTER — Ambulatory Visit
Payer: Medicare HMO | Attending: Student in an Organized Health Care Education/Training Program | Admitting: Student in an Organized Health Care Education/Training Program

## 2017-06-29 ENCOUNTER — Other Ambulatory Visit: Payer: Self-pay

## 2017-06-29 ENCOUNTER — Emergency Department (HOSPITAL_COMMUNITY)
Admission: EM | Admit: 2017-06-29 | Discharge: 2017-06-29 | Disposition: A | Discharge status: Home / Self Care | Payer: Medicare HMO | Attending: Emergency Medicine | Admitting: Emergency Medicine

## 2017-06-29 VITALS — BP 133/67 | HR 72 | Temp 97.9°F | Resp 16 | Ht 71.0 in | Wt 245.0 lb

## 2017-06-29 DIAGNOSIS — Z951 Presence of aortocoronary bypass graft: Secondary | ICD-10-CM | POA: Insufficient documentation

## 2017-06-29 DIAGNOSIS — M545 Low back pain: Secondary | ICD-10-CM | POA: Insufficient documentation

## 2017-06-29 DIAGNOSIS — Z9049 Acquired absence of other specified parts of digestive tract: Secondary | ICD-10-CM | POA: Diagnosis not present

## 2017-06-29 DIAGNOSIS — Z79899 Other long term (current) drug therapy: Secondary | ICD-10-CM | POA: Insufficient documentation

## 2017-06-29 DIAGNOSIS — S39012A Strain of muscle, fascia and tendon of lower back, initial encounter: Secondary | ICD-10-CM | POA: Insufficient documentation

## 2017-06-29 DIAGNOSIS — M170 Bilateral primary osteoarthritis of knee: Secondary | ICD-10-CM | POA: Diagnosis not present

## 2017-06-29 DIAGNOSIS — Z9889 Other specified postprocedural states: Secondary | ICD-10-CM | POA: Insufficient documentation

## 2017-06-29 DIAGNOSIS — G894 Chronic pain syndrome: Secondary | ICD-10-CM | POA: Insufficient documentation

## 2017-06-29 DIAGNOSIS — Z7982 Long term (current) use of aspirin: Secondary | ICD-10-CM | POA: Diagnosis not present

## 2017-06-29 DIAGNOSIS — Y999 Unspecified external cause status: Secondary | ICD-10-CM | POA: Diagnosis not present

## 2017-06-29 DIAGNOSIS — X509XXA Other and unspecified overexertion or strenuous movements or postures, initial encounter: Secondary | ICD-10-CM | POA: Insufficient documentation

## 2017-06-29 DIAGNOSIS — F17210 Nicotine dependence, cigarettes, uncomplicated: Secondary | ICD-10-CM | POA: Diagnosis not present

## 2017-06-29 DIAGNOSIS — M25511 Pain in right shoulder: Secondary | ICD-10-CM | POA: Insufficient documentation

## 2017-06-29 DIAGNOSIS — Y929 Unspecified place or not applicable: Secondary | ICD-10-CM | POA: Insufficient documentation

## 2017-06-29 DIAGNOSIS — I2510 Atherosclerotic heart disease of native coronary artery without angina pectoris: Secondary | ICD-10-CM | POA: Diagnosis not present

## 2017-06-29 DIAGNOSIS — Y939 Activity, unspecified: Secondary | ICD-10-CM | POA: Diagnosis not present

## 2017-06-29 DIAGNOSIS — I10 Essential (primary) hypertension: Secondary | ICD-10-CM | POA: Diagnosis not present

## 2017-06-29 MED ORDER — SODIUM HYALURONATE (VISCOSUP) 20 MG/2ML IX SOSY
2.0000 mL | PREFILLED_SYRINGE | Freq: Once | INTRA_ARTICULAR | Status: AC
  Administered 2017-06-29: 10:00:00 via INTRA_ARTICULAR
  Filled 2017-06-29: qty 2

## 2017-06-29 MED ORDER — OXYCODONE-ACETAMINOPHEN 5-325 MG PO TABS: 1.0000 | ORAL_TABLET | ORAL | 0 refills | Status: DC | PRN

## 2017-06-29 MED ORDER — PREDNISONE 10 MG PO TABS: ORAL_TABLET | ORAL | 0 refills | Status: DC

## 2017-06-29 MED ORDER — LIDOCAINE HCL (PF) 1 % IJ SOLN
5.0000 mL | Freq: Once | INTRAMUSCULAR | Status: AC
  Administered 2017-06-29: 5 mL

## 2017-06-29 MED ORDER — LIDOCAINE HCL (PF) 1 % IJ SOLN
INTRAMUSCULAR | Status: AC
  Filled 2017-06-29: qty 5

## 2017-06-29 MED ORDER — OXYCODONE-ACETAMINOPHEN 5-325 MG PO TABS
1.0000 | ORAL_TABLET | Freq: Once | ORAL | Status: AC
  Administered 2017-06-29: 1 via ORAL
  Filled 2017-06-29: qty 1

## 2017-06-29 MED ORDER — METHOCARBAMOL 500 MG PO TABS: 500.0000 mg | ORAL_TABLET | Freq: Four times a day (QID) | ORAL | 0 refills | Status: AC

## 2017-06-29 MED ORDER — ROPIVACAINE HCL 2 MG/ML IJ SOLN
INTRAMUSCULAR | Status: AC
  Filled 2017-06-29: qty 10

## 2017-06-29 MED ORDER — ROPIVACAINE HCL 2 MG/ML IJ SOLN: 2.0000 mL | Freq: Once | INTRAMUSCULAR | Status: DC

## 2017-06-29 NOTE — Discharge Instructions (Signed)
Take your next dose of prednisone tomorrow morning.  Use the the other medicines as directed.  Do not drive within 4 hours of taking oxycodone as this will make you drowsy.  Avoid lifting,  Bending,  Twisting or any other activity that worsens your pain over the next week.  Apply an  icepack  to your lower back for 10-15 minutes every 2 hours for the next 2 days.  You should get rechecked if your symptoms are not better over the next 5 days,  Or you develop increased pain,  Weakness in your leg(s) or loss of bladder or bowel function - these are symptoms of a worse injury.

## 2017-06-29 NOTE — ED Triage Notes (Signed)
Pt states was bending over picking up clothes out of closet and back "caught". Denies other symptoms. Bowel and bladder normal. Pt states worse when ambulating

## 2017-06-29 NOTE — ED Provider Notes (Signed)
[facility] EMERGENCY DEPARTMENT Provider Note   CSN: [id number] Arrival date & time: 06/29/17  1142     History   Chief Complaint Chief Complaint  Patient presents with  . Back Pain    HPI Jack [name] is a 71 y.o. male with a history of CAD, hypertension, osteoarthritis and chronic pain in his shoulders and knees secondary to arthritis with occasional low back pain presenting with a one-week history of worsening back pain.  He reports bending down to pick up a box that was in his closet and when he stood up had a "catch" in the lower back with persistent pain since.  He has had no weakness or numbness in his legs and denies urinary or fecal incontinence or retention.  He has tried ice and heat, rest and Tylenol with no significant relief of symptoms.  He was seen by a new chronic pain specialist this morning in [place] where he underwent hyaluronate injection in his right knee and his back pain was mentioned at this visit but not addressed per patient's report.     The history is provided by the patient.  Back Pain   Pertinent negatives include no chest pain, no fever, no numbness, no abdominal pain, no dysuria and no weakness.    Past Medical History:  Diagnosis Date  . Coronary atherosclerosis of native coronary artery    Multivessel status post CABG  . HLD (hyperlipidemia)   . Hypertension   . Tobacco user     Patient Active Problem List   Diagnosis Date Noted  . Osteoarthritis of both shoulders 06/09/2017  . Lumbar spondylosis 06/09/2017  . Lumbar degenerative disc disease 06/09/2017  . Chronic pain of both shoulders 06/09/2017  . Chronic prescription benzodiazepine use 06/09/2017  . Chronic pain syndrome 06/09/2017  . Hypertension   . 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 Essential hypertension, benign 07/11/2009  . HYPERLIPIDEMIA-MIXED 06/02/2009  . Coronary atherosclerosis of native coronary artery 06/02/2009    Past Surgical History:  Procedure  Laterality Date  . APPENDECTOMY     age 73  . CHOLECYSTECTOMY  2003  . CORONARY ARTERY BYPASS GRAFT  2008   LIMA to LAD, SVG to ramus, SVG to PDA and PLA  . FOOT SURGERY    . KNEE ARTHROPLASTY    . ROTATOR CUFF REPAIR    . SHOULDER SURGERY         Home Medications    Prior to Admission medications   Medication Sig Start Date End Date Taking? Authorizing Provider  amLODipine (NORVASC) 5 MG tablet Take 5 mg by mouth every other day.  01/27/15   [provider]  aspirin 325 MG tablet Take 325 mg by mouth every other day.     [provider]  carvedilol (COREG) 6.25 MG tablet Take 12.5 mg by mouth every other day.     [provider]  Cholecalciferol (VITAMIN D3) 5000 units CAPS Take 1 capsule by mouth every other day.     [provider]  methocarbamol (ROBAXIN) 500 MG tablet Take 1 tablet (500 mg total) 4 (four) times daily for 10 days by mouth. 06/29/17 07/09/17  Evalee Jefferson, PA-C  oxyCODONE-acetaminophen (PERCOCET/ROXICET) 5-325 MG tablet Take 1 tablet every 4 (four) hours as needed by mouth. 06/29/17   Ardyce Heyer, Almyra Free, PA-C  predniSONE (DELTASONE) 10 MG tablet Take 6 tablets day one, 5 tablets day two, 4 tablets day three, 3 tablets day four, 2 tablets day five, then 1 tablet day  six 06/29/17   Evalee Jefferson, PA-C  rosuvastatin (CRESTOR) 20 MG tablet Take 1 tablet (20 mg total) by mouth at bedtime. 05/07/17   Caren Macadam, MD  tiZANidine (ZANAFLEX) 4 MG tablet Take 0.5 tablets (2 mg total) by mouth 2 (two) times daily as needed for muscle spasms. 06/09/17   [name], MD  traZODone (DESYREL) 100 MG tablet Take 100 mg by mouth at bedtime.  05/15/12   [provider]    Family History Family History  Problem Relation Age of Onset  . Heart attack Mother   . Cerebral aneurysm Father   . Coronary artery disease Neg Hx   . Diabetes Neg Hx   . Hypertension Neg Hx     Social History Social History   Tobacco Use  . Smoking status: Current  Every Day Smoker    Packs/day: 1.00    Years: 57.00    Pack years: 57.00    Types: Cigarettes  . Smokeless tobacco: Never Used  Substance Use Topics  . Alcohol use: Yes    Alcohol/week: 0.0 oz  . Drug use: No     Allergies   Patient has no known allergies.   Review of Systems Review of Systems  Constitutional: Negative for fever.  Respiratory: Negative for shortness of breath.   Cardiovascular: Negative for chest pain and leg swelling.  Gastrointestinal: Negative for abdominal distention, abdominal pain and constipation.  Genitourinary: Negative for difficulty urinating, dysuria, flank pain, frequency and urgency.  Musculoskeletal: Positive for back pain. Negative for gait problem and joint swelling.  Skin: Negative for rash.  Neurological: Negative for weakness and numbness.     Physical Exam Updated Vital Signs BP (!) 169/83 (BP Location: Right Arm)   Pulse 86   Temp 97.7 F (36.5 C) (Oral)   Resp 18   SpO2 97%   Physical Exam  Constitutional: He appears well-developed and well-nourished.  HENT:  Head: Normocephalic.  Eyes: Conjunctivae are normal.  Neck: Normal range of motion. Neck supple.  Cardiovascular: Normal rate and intact distal pulses.  Pedal pulses normal.  Pulmonary/Chest: Effort normal.  Abdominal: Soft. Bowel sounds are normal. He exhibits no distension, no abdominal bruit, no pulsatile midline mass and no mass.  Musculoskeletal: Normal range of motion. He exhibits no edema.       Lumbar back: He exhibits tenderness and swelling. He exhibits no edema and no spasm.       Back:  Left paralumbar tenderness to palpation.  There is edema at the site which is tender, spasm appreciated.  Neurological: He is alert. He has normal strength. He displays no atrophy and no tremor. No sensory deficit. Gait normal.  Reflex Scores:      Patellar reflexes are 2+ on the right side and 2+ on the left side.      Achilles reflexes are 2+ on the right side and 2+ on  the left side. No strength deficit noted in hip and knee flexor and extensor muscle groups.  Ankle flexion and extension intact.  Skin: Skin is warm and dry.  Psychiatric: He has a normal mood and affect.  Nursing note and vitals reviewed.    ED Treatments / Results  Labs (all labs ordered are listed, but only abnormal results are displayed) Labs Reviewed - No data to display  EKG  EKG Interpretation None       Radiology Dg Lumbar Spine Complete  Result Date: 06/29/2017 CLINICAL DATA:  Low back pain, lifting injury EXAM: LUMBAR SPINE -  COMPLETE 4+ VIEW COMPARISON:  Lumbar spine radiographs dated 02/08/2015 FINDINGS: Five lumbar-type vertebral bodies. Normal lumbar lordosis.  Mild lumbar levoscoliosis. No evidence of fracture or dislocation. Vertebral body heights are maintained. Mild to moderate multilevel degenerative changes. Visualized bony pelvis appears intact. Cholecystectomy clips.  Vascular calcifications. 10 mm calcification overlying the left lower abdomen, possibly reflecting a left lower pole renal calculus, previously 8 mm in 2016. IMPRESSION: Mild to moderate degenerative changes. No fracture or dislocation is seen. Possible 10 mm left lower pole renal calculus, previously 8 mm. Electronically Signed   By: Julian Hy M.D.   On: 06/29/2017 13:55    Procedures Procedures (including critical care time)  Medications Ordered in ED Medications  oxyCODONE-acetaminophen (PERCOCET/ROXICET) 5-325 MG per tablet 1 tablet (1 tablet Oral Given 06/29/17 1328)     Initial Impression / Assessment and Plan / ED Course  I have reviewed the triage vital signs and the nursing notes.  Pertinent labs & imaging results that were available during my care of the patient were reviewed by me and considered in my medical decision making (see chart for details).     [place] controlled substance database reviewed. Patient has had persistent monthly narcotic prescriptions by his PCP with  last prescription 05/11/2017 which is consistent with patient's statement.  I will prescribe a small quantity of hydrocodone, Robaxin and prednisone taper also started today.  Advised heat therapy, activity as tolerated.  Follow-up with his PCP and/or his new pain specialist which he states he has in the appointment within 1 month.  No neuro deficit on exam or by history to suggest emergent or surgical presentation.  Also discussed worsened sx that should prompt immediate re-evaluation including distal weakness, bowel/bladder retention/incontinence.        Final Clinical Impressions(s) / ED Diagnoses   Final diagnoses:  Strain of lumbar region, initial encounter    ED Discharge Orders        Ordered    oxyCODONE-acetaminophen (PERCOCET/ROXICET) 5-325 MG tablet  Every 4 hours PRN     06/29/17 1436    methocarbamol (ROBAXIN) 500 MG tablet  4 times daily     06/29/17 1436    predniSONE (DELTASONE) 10 MG tablet     06/29/17 1436       Evalee Jefferson, PA-C 06/29/17 1437    Noemi Chapel, MD 06/30/17 [phone]

## 2017-06-29 NOTE — Progress Notes (Signed)
Safety precautions to be maintained throughout the outpatient stay will include: orient to surroundings, keep bed in low position, maintain call bell within reach at all times, provide assistance with transfer out of bed and ambulation.  

## 2017-06-29 NOTE — ED Notes (Signed)
Pt ambulated to bathroom with aid of walker. NAD noted. Pt stable.

## 2017-06-29 NOTE — Progress Notes (Signed)
Patient's Name: Jack [name]  MRN: [id number]  Referring Provider: [name], MD  DOB: 03-20-1946  PCP: Caren Macadam, MD  DOS: 06/29/2017  Note by: [name], MD  Service setting: Ambulatory outpatient  Specialty: Interventional Pain Management  Patient type: Established  Location: [facility] (AMB) Pain Management Facility  Visit type: Interventional Procedure   Primary Reason for Visit: Interventional Pain Management Treatment. CC: Knee Pain (bilateral, better after injection); Shoulder Pain (right, better after injection); and Back Pain (lower, injured last week moving clothes )  Procedure:  Anesthesia, Analgesia, Anxiolysis:  Type: Diagnostic Intra-Articular Hyalgan Knee Injection #2 Region: Lateral infrapatellar Knee Region Level: Knee Joint Laterality: Bilateral  Type: Local Anesthesia Local Anesthetic: Lidocaine 1% Route: Infiltration ([place]/IM) IV Access: Declined Sedation: Declined  Indication(s): Analgesia           Indications: 1. Primary osteoarthritis of both knees   2. Chronic pain syndrome    Pain Score: Pre-procedure: 0-No pain/10 Post-procedure: 0-No pain/10  Pre-op Assessment:  Jack [name] is a 71 y.o. (year old), male patient, seen today for interventional treatment. He  has a past surgical history that includes Foot surgery; Appendectomy; Knee Arthroplasty; Coronary artery bypass graft (2008); Rotator cuff repair; Shoulder surgery; and Cholecystectomy (2003). Jack [name] has a current medication list which includes the following prescription(s): amlodipine, aspirin, carvedilol, vitamin d3, rosuvastatin, tizanidine, trazodone, and oxycodone-acetaminophen, and the following Facility-Administered Medications: ropivacaine (pf) 2 mg/ml (0.2%). His primarily concern today is the Knee Pain (bilateral, better after injection); Shoulder Pain (right, better after injection); and Back Pain (lower, injured last week moving clothes )  Patient  returns today for bilateral  intra-articular Hyalgan injection #2.  Patient states that his knees are feeling much better since his first Hyalgan injection.  He is having less pain and is able to ambulate with greater ease.  Furthermore patient states that his right shoulder is also doing better after our right posterior shoulder joint injection.  He is scheduled to see pain psychology today afterwards he will follow-up with me to discuss medication management.  Initial Vital Signs: Blood pressure (!) 153/76, pulse 80, temperature (!) 97.5 F (36.4 C), temperature source Oral, resp. rate 20, height 5\' 11"  (1.803 m), weight 240 lb (108.9 kg), SpO2 97 %. BMI: Estimated body mass index is 34.17 kg/m as calculated from the following:   Height as of this encounter: 5\' 11"  (1.803 m).   Weight as of this encounter: 245 lb (111.1 kg).  Risk Assessment: Allergies: Reviewed. He has No Known Allergies.  Allergy Precautions: None required Coagulopathies: Reviewed. None identified.  Blood-thinner therapy: None at this time Active Infection(s): Reviewed. None identified. Jack [name] is afebrile  Site Confirmation: Jack [name] was asked to confirm the procedure and laterality before marking the site Procedure checklist: Completed Consent: Before the procedure and under the influence of no sedative(s), amnesic(s), or anxiolytics, the patient was informed of the treatment options, risks and possible complications. To fulfill our ethical and legal obligations, as recommended by the American Medical Association's Code of Ethics, I have informed the patient of my clinical impression; the nature and purpose of the treatment or procedure; the risks, benefits, and possible complications of the intervention; the alternatives, including doing nothing; the risk(s) and benefit(s) of the alternative treatment(s) or procedure(s); and the risk(s) and benefit(s) of doing nothing. The patient was provided information about the general risks and possible  complications associated with the procedure. These may include, but are not limited to: failure to achieve desired goals,  infection, bleeding, organ or nerve damage, allergic reactions, paralysis, and death. In addition, the patient was informed of those risks and complications associated to the procedure, such as failure to decrease pain; infection; bleeding; organ or nerve damage with subsequent damage to sensory, motor, and/or autonomic systems, resulting in permanent pain, numbness, and/or weakness of one or several areas of the body; allergic reactions; (i.e.: anaphylactic reaction); and/or death. Furthermore, the patient was informed of those risks and complications associated with the medications. These include, but are not limited to: allergic reactions (i.e.: anaphylactic or anaphylactoid reaction(s)); adrenal axis suppression; blood sugar elevation that in diabetics may result in ketoacidosis or comma; water retention that in patients with history of congestive heart failure may result in shortness of breath, pulmonary edema, and decompensation with resultant heart failure; weight gain; swelling or edema; medication-induced neural toxicity; particulate matter embolism and blood vessel occlusion with resultant organ, and/or nervous system infarction; and/or aseptic necrosis of one or more joints. Finally, the patient was informed that Medicine is not an exact science; therefore, there is also the possibility of unforeseen or unpredictable risks and/or possible complications that may result in a catastrophic outcome. The patient indicated having understood very clearly. We have given the patient no guarantees and we have made no promises. Enough time was given to the patient to ask questions, all of which were answered to the patient's satisfaction. Jack [name] has indicated that he wanted to continue with the procedure. Attestation: I, the ordering provider, attest that I have discussed with the patient  the benefits, risks, side-effects, alternatives, likelihood of achieving goals, and potential problems during recovery for the procedure that I have provided informed consent. Date: 06/29/2017; Time: 9:16 AM  Pre-Procedure Preparation:  Monitoring: As per clinic protocol. Respiration, ETCO2, SpO2, BP, heart rate and rhythm monitor placed and checked for adequate function Safety Precautions: Patient was assessed for positional comfort and pressure points before starting the procedure. Time-out: I initiated and conducted the "Time-out" before starting the procedure, as per protocol. The patient was asked to participate by confirming the accuracy of the "Time Out" information. Verification of the correct person, site, and procedure were performed and confirmed by me, the nursing staff, and the patient. "Time-out" conducted as per Joint Commission's Universal Protocol (UP.01.01.01). "Time-out" Date & Time: 06/29/2017; 0937 hrs.  Description of Procedure Process:   Position: Sitting Target Area: Knee Joint Approach: Just above the Lateral tibial plateau, lateral to the infrapatellar tendon. Area Prepped: Entire knee area, from the mid-thigh to the mid-shin. Prepping solution: ChloraPrep (2% chlorhexidine gluconate and 70% isopropyl alcohol) Safety Precautions: Aspiration looking for blood return was conducted prior to all injections. At no point did we inject any substances, as a needle was being advanced. No attempts were made at seeking any paresthesias. Safe injection practices and needle disposal techniques used. Medications properly checked for expiration dates. SDV (single dose vial) medications used. Description of the Procedure: Protocol guidelines were followed. The patient was placed in position over the fluoroscopy table. The target area was identified and the area prepped in the usual manner. Skin desensitized using vapocoolant spray. Skin & deeper tissues infiltrated with local anesthetic.  Appropriate amount of time allowed to pass for local anesthetics to take effect. The procedure needles were then advanced to the target area. Proper needle placement secured. Negative aspiration confirmed. Solution injected in intermittent fashion, asking for systemic symptoms every 0.5cc of injectate. The needles were then removed and the area cleansed, making sure to leave some  of the prepping solution back to take advantage of its long term bactericidal properties. Vitals:   06/29/17 0904 06/29/17 0946  BP: (!) 152/65 133/67  Pulse: 64 72  Resp: 16 16  Temp: 97.9 F (36.6 C)   TempSrc: Oral   SpO2: 96% 98%  Weight: 245 lb (111.1 kg)   Height: 5\' 11"  (1.803 m)     Start Time: 0937 hrs. End Time: 0944 hrs. Materials:  Needle(s) Type: Regular needle Gauge: 22G Length: 3.5-in Medication(s): We administered lidocaine (PF), Sodium Hyaluronate, and Sodium Hyaluronate. Please see chart orders for dosing details. 2 cc of Hyalgan (20 mg total) in each knee. Imaging Guidance:  Type of Imaging Technique: None used Indication(s): N/A Exposure Time: No patient exposure Contrast: None used. Fluoroscopic Guidance: N/A Ultrasound Guidance: N/A Interpretation: N/A  Antibiotic Prophylaxis:  Indication(s): None identified Antibiotic given: None  Post-operative Assessment:  EBL: None Complications: No immediate post-treatment complications observed by team, or reported by patient. Note: The patient tolerated the entire procedure well. A repeat set of vitals were taken after the procedure and the patient was kept under observation following institutional policy, for this type of procedure. Post-procedural neurological assessment was performed, showing return to baseline, prior to discharge. The patient was provided with post-procedure discharge instructions, including a section on how to identify potential problems. Should any problems arise concerning this procedure, the patient was given  instructions to immediately contact us, at any time, without hesitation. In any case, we plan to contact the patient by telephone for a follow-up status report regarding this interventional procedure. Comments:  No additional relevant information. 5 out of 5 strength bilateral lower extremity: Plantar flexion, dorsiflexion, knee flexion, knee extension.  Plan of Care   Imaging Orders  No imaging studies ordered today    Procedure Orders     KNEE INJECTION  Medications ordered for procedure: Meds ordered this encounter  Medications  . lidocaine (PF) (XYLOCAINE) 1 % injection 5 mL  . Sodium Hyaluronate SOSY 2 mL  . Sodium Hyaluronate SOSY 2 mL  . ropivacaine (PF) 2 mg/mL (0.2%) (NAROPIN) injection 2 mL   Medications administered: We administered lidocaine (PF), Sodium Hyaluronate, and Sodium Hyaluronate.  See the medical record for exact dosing, route, and time of administration.  This SmartLink is deprecated. Use AVSMEDLIST instead to display the medication list for a patient. Disposition: Discharge home  Discharge Date & Time: 06/29/2017; 0954 hrs.   Physician-requested Follow-up: Return in about 3 weeks (around 07/20/2017) for Procedure, Medication Management.  Repeat IA hyalgan injection 3/5 at next appointment  Future Appointments  Date Time Provider [place]  07/20/2017 10:00 AM [name], MD [facility] None  08/05/2017  8:40 AM Caren Macadam, MD [facility]  08/07/2017  9:20 AM Satira Sark, MD [id number]   Primary Care Physician: Caren Macadam, MD Location: [facility] Note by: [name], MD Date: 06/29/2017; Time: 1:38 PM  Disclaimer:  Medicine is not an exact science. The only guarantee in medicine is that nothing is guaranteed. It is important to note that the decision to proceed with this intervention was based on the information collected from the patient. The Data and conclusions were drawn from the  patient's questionnaire, the interview, and the physical examination. Because the information was provided in large part by the patient, it cannot be guaranteed that it has not been purposely or unconsciously manipulated. Every effort has been made to obtain as much relevant data as possible for this evaluation.  It is important to note that the conclusions that lead to this procedure are derived in large part from the available data. Always take into account that the treatment will also be dependent on availability of resources and existing treatment guidelines, considered by other Pain Management Practitioners as being common knowledge and practice, at the time of the intervention. For Medico-Legal purposes, it is also important to point out that variation in procedural techniques and pharmacological choices are the acceptable norm. The indications, contraindications, technique, and results of the above procedure should only be interpreted and judged by a Board-Certified Interventional Pain Specialist with extensive familiarity and expertise in the same exact procedure and technique.

## 2017-06-30 ENCOUNTER — Telehealth: Payer: Self-pay | Admitting: *Deleted

## 2017-06-30 NOTE — Telephone Encounter (Signed)
Attempted to reach patient re; procedure on yesterday, no voicemail capability.

## 2017-07-09 ENCOUNTER — Telehealth: Payer: Self-pay | Admitting: Student in an Organized Health Care Education/Training Program

## 2017-07-09 NOTE — Telephone Encounter (Signed)
Patient has had a painful knot on his left knee since Tues. 07-07-17 please call to discuss, informed patient that Dr. Holley Raring is out of clinic until 07-20-17

## 2017-07-09 NOTE — Telephone Encounter (Signed)
Patient advised to see PCP, as this is a newly occurring pain.

## 2017-07-20 ENCOUNTER — Encounter: Payer: Self-pay | Admitting: Student in an Organized Health Care Education/Training Program

## 2017-07-20 ENCOUNTER — Ambulatory Visit
Payer: Medicare HMO | Attending: Student in an Organized Health Care Education/Training Program | Admitting: Student in an Organized Health Care Education/Training Program

## 2017-07-20 ENCOUNTER — Other Ambulatory Visit: Payer: Self-pay

## 2017-07-20 VITALS — BP 131/82 | HR 70 | Temp 97.7°F | Resp 16 | Ht 71.0 in | Wt 245.0 lb

## 2017-07-20 DIAGNOSIS — Z9889 Other specified postprocedural states: Secondary | ICD-10-CM | POA: Diagnosis not present

## 2017-07-20 DIAGNOSIS — M25562 Pain in left knee: Secondary | ICD-10-CM | POA: Diagnosis present

## 2017-07-20 DIAGNOSIS — Z9049 Acquired absence of other specified parts of digestive tract: Secondary | ICD-10-CM | POA: Diagnosis not present

## 2017-07-20 DIAGNOSIS — M19011 Primary osteoarthritis, right shoulder: Secondary | ICD-10-CM | POA: Diagnosis not present

## 2017-07-20 DIAGNOSIS — M170 Bilateral primary osteoarthritis of knee: Secondary | ICD-10-CM | POA: Insufficient documentation

## 2017-07-20 DIAGNOSIS — G894 Chronic pain syndrome: Secondary | ICD-10-CM | POA: Diagnosis not present

## 2017-07-20 DIAGNOSIS — Z951 Presence of aortocoronary bypass graft: Secondary | ICD-10-CM | POA: Diagnosis not present

## 2017-07-20 DIAGNOSIS — M19012 Primary osteoarthritis, left shoulder: Secondary | ICD-10-CM | POA: Insufficient documentation

## 2017-07-20 MED ORDER — SODIUM HYALURONATE (VISCOSUP) 20 MG/2ML IX SOSY
2.0000 mL | PREFILLED_SYRINGE | Freq: Once | INTRA_ARTICULAR | Status: AC
  Administered 2017-07-20: 2 mL via INTRA_ARTICULAR
  Filled 2017-07-20: qty 2

## 2017-07-20 MED ORDER — LIDOCAINE HCL (PF) 1 % IJ SOLN
INTRAMUSCULAR | Status: AC
  Filled 2017-07-20: qty 5

## 2017-07-20 MED ORDER — LIDOCAINE HCL (PF) 1 % IJ SOLN
5.0000 mL | Freq: Once | INTRAMUSCULAR | Status: AC
  Administered 2017-07-20: 5 mL

## 2017-07-20 MED ORDER — OXYCODONE-ACETAMINOPHEN 10-325 MG PO TABS: 1.0000 | ORAL_TABLET | Freq: Three times a day (TID) | ORAL | 0 refills | Status: DC | PRN

## 2017-07-20 NOTE — Patient Instructions (Signed)
Knee Injection A knee injection is a procedure to get medicine into your knee joint. Your health care provider puts a needle into the joint and injects medicine with an attached syringe. The injected medicine may relieve the pain, swelling, and stiffness of arthritis. The injected medicine may also help to lubricate and cushion your knee joint. You may need more than one injection. Tell a health care provider about:  Any allergies you have.  All medicines you are taking, including vitamins, herbs, eye drops, creams, and over-the-counter medicines.  Any problems you or family members have had with anesthetic medicines.  Any blood disorders you have.  Any surgeries you have had.  Any medical conditions you have. What are the risks? Generally, this is a safe procedure. However, problems may occur, including:  Infection.  Bleeding.  Worsening symptoms.  Damage to the area around your knee.  Allergic reaction to any of the medicines.  Skin reactions from repeated injections.  What happens before the procedure?  Ask your health care provider about changing or stopping your regular medicines. This is especially important if you are taking diabetes medicines or blood thinners.  Plan to have someone take you home after the procedure. What happens during the procedure?  You will sit or lie down in a position for your knee to be treated.  The skin over your kneecap will be cleaned with a germ-killing solution (antiseptic).  You will be given a medicine that numbs the area (local anesthetic). You may feel some stinging.  After your knee becomes numb, you will have a second injection. This is the medicine. This needle is carefully placed between your kneecap and your knee. The medicine is injected into the joint space.  At the end of the procedure, the needle will be removed.  A bandage (dressing) may be placed over the injection site. The procedure may vary among health care  providers and hospitals. What happens after the procedure?  You may have to move your knee through its full range of motion. This helps to get all of the medicine into your joint space.  Your blood pressure, heart rate, breathing rate, and blood oxygen level will be monitored often until the medicines you were given have worn off.  You will be watched to make sure that you do not have a reaction to the injected medicine. This information is not intended to replace advice given to you by your health care provider. Make sure you discuss any questions you have with your health care provider. Document Released: 11/02/2006 Document Revised: 01/11/2016 Document Reviewed: 06/21/2014 [employer] Interactive Patient Education  2018 [employer]  

## 2017-07-20 NOTE — Progress Notes (Signed)
Patient's Name: Jack [name]  MRN: [id number]  Referring Provider: Caren Macadam, MD  DOB: 08-12-1946  PCP: Caren Macadam, MD  DOS: 07/20/2017  Note by: [name], MD  Service setting: Ambulatory outpatient  Specialty: Interventional Pain Management  Patient type: Established  Location: [facility] (AMB) Pain Management Facility  Visit type: Interventional Procedure   Primary Reason for Visit: Interventional Pain Management Treatment. CC: Knee Pain (left on top)  Procedure:  Anesthesia, Analgesia, Anxiolysis:  Type: Diagnostic Intra-Articular Hyalgan Knee Injection #3 Region: Lateral infrapatellar Knee Region Level: Knee Joint Laterality: Bilateral  Type: Local Anesthesia Local Anesthetic: Lidocaine 1% Route: Infiltration (/IM) IV Access: Declined Sedation: Declined  Indication(s): Analgesia           Indications: 1. Primary osteoarthritis of both knees   2. Chronic pain syndrome   3. Osteoarthritis of both shoulders, unspecified osteoarthritis type    Pain Score: Pre-procedure: 6 /10 Post-procedure: 0-No pain/10  Pre-op Assessment:  Jack [name] is a 71 y.o. (year old), male patient, seen today for interventional treatment. He  has a past surgical history that includes Foot surgery; Appendectomy; Knee Arthroplasty; Coronary artery bypass graft (2008); Rotator cuff repair; Shoulder surgery; and Cholecystectomy (2003). Jack [name] has a current medication list which includes the following prescription(s): amlodipine, aspirin, carvedilol, vitamin d3, trazodone, oxycodone-acetaminophen, prednisone, rosuvastatin, and tizanidine. His primarily concern today is the Knee Pain (left on top)  Patient  returns today for bilateral intra-articular Hyalgan injection #2.  Patient states that his knees are feeling much better since his first Hyalgan injection.  He is having less pain and is able to ambulate with greater ease.  Furthermore patient states that his right shoulder is also doing better  after our right posterior shoulder joint injection.  He is scheduled to see pain psychology today afterwards he will follow-up with me to discuss medication management.  Initial Vital Signs: Blood pressure (!) 153/76, pulse 80, temperature (!) 97.5 F (36.4 C), temperature source Oral, resp. rate 20, height 5\' 11"  (1.803 m), weight 240 lb (108.9 kg), SpO2 97 %. BMI: Estimated body mass index is 34.17 kg/m as calculated from the following:   Height as of this encounter: 5\' 11"  (1.803 m).   Weight as of this encounter: 245 lb (111.1 kg).  Risk Assessment: Allergies: Reviewed. He has No Known Allergies.  Allergy Precautions: None required Coagulopathies: Reviewed. None identified.  Blood-thinner therapy: None at this time Active Infection(s): Reviewed. None identified. Jack [name] is afebrile  Site Confirmation: Jack [name] was asked to confirm the procedure and laterality before marking the site Procedure checklist: Completed Consent: Before the procedure and under the influence of no sedative(s), amnesic(s), or anxiolytics, the patient was informed of the treatment options, risks and possible complications. To fulfill our ethical and legal obligations, as recommended by the American Medical Association's Code of Ethics, I have informed the patient of my clinical impression; the nature and purpose of the treatment or procedure; the risks, benefits, and possible complications of the intervention; the alternatives, including doing nothing; the risk(s) and benefit(s) of the alternative treatment(s) or procedure(s); and the risk(s) and benefit(s) of doing nothing. The patient was provided information about the general risks and possible complications associated with the procedure. These may include, but are not limited to: failure to achieve desired goals, infection, bleeding, organ or nerve damage, allergic reactions, paralysis, and death. In addition, the patient was informed of those risks and  complications associated to the procedure, such as failure to decrease pain; infection;  bleeding; organ or nerve damage with subsequent damage to sensory, motor, and/or autonomic systems, resulting in permanent pain, numbness, and/or weakness of one or several areas of the body; allergic reactions; (i.e.: anaphylactic reaction); and/or death. Furthermore, the patient was informed of those risks and complications associated with the medications. These include, but are not limited to: allergic reactions (i.e.: anaphylactic or anaphylactoid reaction(s)); adrenal axis suppression; blood sugar elevation that in diabetics may result in ketoacidosis or comma; water retention that in patients with history of congestive heart failure may result in shortness of breath, pulmonary edema, and decompensation with resultant heart failure; weight gain; swelling or edema; medication-induced neural toxicity; particulate matter embolism and blood vessel occlusion with resultant organ, and/or nervous system infarction; and/or aseptic necrosis of one or more joints. Finally, the patient was informed that Medicine is not an exact science; therefore, there is also the possibility of unforeseen or unpredictable risks and/or possible complications that may result in a catastrophic outcome. The patient indicated having understood very clearly. We have given the patient no guarantees and we have made no promises. Enough time was given to the patient to ask questions, all of which were answered to the patient's satisfaction. Jack [name] has indicated that he wanted to continue with the procedure. Attestation: I, the ordering provider, attest that I have discussed with the patient the benefits, risks, side-effects, alternatives, likelihood of achieving goals, and potential problems during recovery for the procedure that I have provided informed consent. Date: 07/20/2017; Time: 9:16 AM  Pre-Procedure Preparation:  Monitoring: As per  clinic protocol. Respiration, ETCO2, SpO2, BP, heart rate and rhythm monitor placed and checked for adequate function Safety Precautions: Patient was assessed for positional comfort and pressure points before starting the procedure. Time-out: I initiated and conducted the "Time-out" before starting the procedure, as per protocol. The patient was asked to participate by confirming the accuracy of the "Time Out" information. Verification of the correct person, site, and procedure were performed and confirmed by me, the nursing staff, and the patient. "Time-out" conducted as per Joint Commission's Universal Protocol (UP.01.01.01). "Time-out" Date & Time: 07/20/2017; 1130 hrs.  Description of Procedure Process:   Position: Sitting Target Area: Knee Joint Approach: Just above the Lateral tibial plateau, lateral to the infrapatellar tendon. Area Prepped: Entire knee area, from the mid-thigh to the mid-shin. Prepping solution: ChloraPrep (2% chlorhexidine gluconate and 70% isopropyl alcohol) Safety Precautions: Aspiration looking for blood return was conducted prior to all injections. At no point did we inject any substances, as a needle was being advanced. No attempts were made at seeking any paresthesias. Safe injection practices and needle disposal techniques used. Medications properly checked for expiration dates. SDV (single dose vial) medications used. Description of the Procedure: Protocol guidelines were followed. The patient was placed in position over the fluoroscopy table. The target area was identified and the area prepped in the usual manner. Skin desensitized using vapocoolant spray. Skin & deeper tissues infiltrated with local anesthetic. Appropriate amount of time allowed to pass for local anesthetics to take effect. The procedure needles were then advanced to the target area. Proper needle placement secured. Negative aspiration confirmed. Solution injected in intermittent fashion, asking for  systemic symptoms every 0.5cc of injectate. The needles were then removed and the area cleansed, making sure to leave some of the prepping solution back to take advantage of its long term bactericidal properties. Vitals:   07/20/17 1004 07/20/17 1140  BP: 129/75 131/82  Pulse: 68 70  Resp: 20 16  Temp: 97.7 F (36.5 C)   TempSrc: Oral   SpO2: 97% 96%  Weight: 245 lb (111.1 kg)   Height: 5\' 11"  (1.803 m)     Start Time: 1130 hrs. End Time: 1134 hrs. Materials:  Needle(s) Type: Regular needle Gauge: 22G Length: 3.5-in Medication(s): Jack [name] had no medications administered during this visit. Please see chart orders for dosing details. 2 cc of Hyalgan (20 mg total) in each knee. Imaging Guidance:  Type of Imaging Technique: None used Indication(s): N/A Exposure Time: No patient exposure Contrast: None used. Fluoroscopic Guidance: N/A Ultrasound Guidance: N/A Interpretation: N/A  Antibiotic Prophylaxis:  Indication(s): None identified Antibiotic given: None  Post-operative Assessment:  EBL: None Complications: No immediate post-treatment complications observed by team, or reported by patient. Note: The patient tolerated the entire procedure well. A repeat set of vitals were taken after the procedure and the patient was kept under observation following institutional policy, for this type of procedure. Post-procedural neurological assessment was performed, showing return to baseline, prior to discharge. The patient was provided with post-procedure discharge instructions, including a section on how to identify potential problems. Should any problems arise concerning this procedure, the patient was given instructions to immediately contact us, at any time, without hesitation. In any case, we plan to contact the patient by telephone for a follow-up status report regarding this interventional procedure. Comments:  No additional relevant information. 5 out of 5 strength bilateral  lower extremity: Plantar flexion, dorsiflexion, knee flexion, knee extension.  Plan of Care  This will be third intra-articular bilateral Hyalgan injection.  After this, we will stop.  Patient notices improvement of his knee pain.   We will also have patient complete opioid contract with our clinic and continue his opioid therapy that he was on in the past.  Patient was previously taking oxycodone 10 mg up to 4 times a day as needed for severe, chronic pain. Given that the patient has been off of chronic opioid therapy since September, we will initiate at a lower dose, 10 mg up to 3 times a day as needed for severe pain.  [place] control substance database was checked and appropriate.  Patient's previous urine drug screen was also appropriate.   Follow-up in 4 weeks for post-procedural evaluation and medication refill.  Medications ordered for procedure: Meds ordered this encounter  Medications  . oxyCODONE-acetaminophen (PERCOCET) 10-325 MG tablet    Sig: Take 1 tablet by mouth every 8 (eight) hours as needed for pain.    Dispense:  90 tablet    Refill:  0    Do not place this medication, or any other prescription from our practice, on "Automatic Refill". Patient may have prescription filled one day early if pharmacy is closed on scheduled refill date.  For chronic pain To last 30 days from fill date   Medications administered: Jack [name] had no medications administered during this visit.  See the medical record for exact dosing, route, and time of administration.  This SmartLink is deprecated. Use AVSMEDLIST instead to display the medication list for a patient. Disposition: Discharge home  Discharge Date & Time: 07/20/2017;   hrs.   Physician-requested Follow-up: Return in about 4 weeks (around 08/17/2017) for Medication Management, Post Procedure Evaluation.    Future Appointments  Date Time Provider [place]  08/05/2017  8:40 AM Caren Macadam, MD [facility]  08/07/2017  9:20 AM Satira Sark, MD [facility]  09/01/2017 10:00 AM [name], MD [facility] None  Primary Care Physician: Caren Macadam, MD Location: [facility] Note by: [name], MD Date: 07/20/2017; Time: 2:20 PM  Disclaimer:  Medicine is not an exact science. The only guarantee in medicine is that nothing is guaranteed. It is important to note that the decision to proceed with this intervention was based on the information collected from the patient. The Data and conclusions were drawn from the patient's questionnaire, the interview, and the physical examination. Because the information was provided in large part by the patient, it cannot be guaranteed that it has not been purposely or unconsciously manipulated. Every effort has been made to obtain as much relevant data as possible for this evaluation. It is important to note that the conclusions that lead to this procedure are derived in large part from the available data. Always take into account that the treatment will also be dependent on availability of resources and existing treatment guidelines, considered by other Pain Management Practitioners as being common knowledge and practice, at the time of the intervention. For Medico-Legal purposes, it is also important to point out that variation in procedural techniques and pharmacological choices are the acceptable norm. The indications, contraindications, technique, and results of the above procedure should only be interpreted and judged by a Board-Certified Interventional Pain Specialist with extensive familiarity and expertise in the same exact procedure and technique.

## 2017-07-21 ENCOUNTER — Telehealth: Payer: Self-pay | Admitting: *Deleted

## 2017-07-21 NOTE — Telephone Encounter (Signed)
Spoke with patient re; procedure on yesterday, verbalizes no questions or concerns.  

## 2017-08-05 ENCOUNTER — Ambulatory Visit: Payer: Medicare HMO | Admitting: Family Medicine

## 2017-08-05 ENCOUNTER — Ambulatory Visit (INDEPENDENT_AMBULATORY_CARE_PROVIDER_SITE_OTHER): Payer: Medicare HMO | Admitting: Family Medicine

## 2017-08-05 ENCOUNTER — Other Ambulatory Visit: Payer: Self-pay

## 2017-08-05 ENCOUNTER — Encounter: Payer: Self-pay | Admitting: Family Medicine

## 2017-08-05 VITALS — BP 124/80 | HR 75 | Temp 97.7°F | Resp 16 | Ht 71.0 in | Wt 238.2 lb

## 2017-08-05 DIAGNOSIS — I25810 Atherosclerosis of coronary artery bypass graft(s) without angina pectoris: Secondary | ICD-10-CM | POA: Diagnosis not present

## 2017-08-05 DIAGNOSIS — E782 Mixed hyperlipidemia: Secondary | ICD-10-CM

## 2017-08-05 DIAGNOSIS — R7989 Other specified abnormal findings of blood chemistry: Secondary | ICD-10-CM | POA: Diagnosis not present

## 2017-08-05 DIAGNOSIS — G4700 Insomnia, unspecified: Secondary | ICD-10-CM

## 2017-08-05 DIAGNOSIS — Z79899 Other long term (current) drug therapy: Secondary | ICD-10-CM

## 2017-08-05 DIAGNOSIS — F322 Major depressive disorder, single episode, severe without psychotic features: Secondary | ICD-10-CM

## 2017-08-05 DIAGNOSIS — I10 Essential (primary) hypertension: Secondary | ICD-10-CM | POA: Diagnosis not present

## 2017-08-05 MED ORDER — ROSUVASTATIN CALCIUM 20 MG PO TABS: 20.0000 mg | ORAL_TABLET | Freq: Every day | ORAL | 3 refills | Status: DC

## 2017-08-05 MED ORDER — CITALOPRAM HYDROBROMIDE 20 MG PO TABS: 20.0000 mg | ORAL_TABLET | Freq: Every day | ORAL | 3 refills | Status: DC

## 2017-08-05 MED ORDER — CARVEDILOL 6.25 MG PO TABS: 12.5000 mg | ORAL_TABLET | ORAL | 1 refills | Status: DC

## 2017-08-05 MED ORDER — TRAZODONE HCL 100 MG PO TABS: 100.0000 mg | ORAL_TABLET | Freq: Every day | ORAL | 1 refills | Status: DC

## 2017-08-05 MED ORDER — AMLODIPINE BESYLATE 5 MG PO TABS: 5.0000 mg | ORAL_TABLET | ORAL | 1 refills | Status: DC

## 2017-08-05 NOTE — Progress Notes (Signed)
Patient ID: Jack [name], male    DOB: 11-Jan-1946, 71 y.o.   MRN: [id number]  Chief Complaint  Patient presents with  . Depression   Allergies Patient has no known allergies.  Subjective:   Jack [name] is a 71 y.o. male who presents to [facility] today.  HPI Jack [name] presents today with his 2 sons for his office visit.  His sons report they brought him in today because they are concerned about his mood.  Jack [name] openly reports that he feels down, sad, and spends most of his day crying or sleeping.  He reports that his wife of over 27 years died last year on Christmas eve.  He reports that since October, which was her birthday, he has been feeling down and depressed.  He reports that he sleeps most of the day and often does not want to get out of bed.  Has not had an appetite.  Used to go to his son's house for meals and spend time with his grandchildren but has not wanted to participate in those activities for the past couple months.  His sons reports that he does not want to go out or do things that he used to enjoy doing.  Has never been treated for depression in the past.  Reports he has never felt like this before and does not want to continue living feeling this bad.  He reports that he has had thoughts of just going outside and sitting in his car.  He reports that he would not hurt himself or try to to kill himself.  He does report that he does not want to live feeling this badly.  He has plenty of food in his house but does not want to eat.  Jack [name] reports that he does have hope that he will feel better with the medication.  He denies any suicide attempts.  Denies alcohol or drug use.  Reports that he needs refills on his blood pressure medicines today.  Was started on Crestor after getting labs at his initial visit at our office back in September.  Denies any myalgias.  Has an upcoming follow-up appointment with his cardiologist this week.  Denies any chest  pain or shortness of breath.  Denies any swelling in his extremities.  Has a history of coronary artery bypass graft x4.  Was checked for diabetes at his last visit.  His blood sugars were within normal limits and A1c was normal.  Seen by interventional pain clinic and given shots in his shoulder and his knees.  Reports that his pain is better.  Has come off most of his pain medication.  Reports that they will not give him Xanax.  Depression         This is a new problem.  The current episode started more than 1 month ago.   The onset quality is gradual.   The problem occurs constantly.  The problem has been gradually worsening since onset.  Associated symptoms include decreased concentration, fatigue, insomnia, restlessness, decreased interest, appetite change and sad.  Associated symptoms include no helplessness.     The symptoms are aggravated by family issues.  Past treatments include nothing.  Risk factors include major life event.    Pertinent negatives include no suicide attempts.   Past Medical History:  Diagnosis Date  . Coronary atherosclerosis of native coronary artery    Multivessel status post CABG  . HLD (hyperlipidemia)   . Hypertension   .  Tobacco user     Past Surgical History:  Procedure Laterality Date  . APPENDECTOMY     age 41  . CHOLECYSTECTOMY  2001/12/20  . CORONARY ARTERY BYPASS GRAFT  December 21, 2006   LIMA to LAD, SVG to ramus, SVG to PDA and PLA  . FOOT SURGERY    . KNEE ARTHROPLASTY    . ROTATOR CUFF REPAIR    . SHOULDER SURGERY      Family History  Problem Relation Age of Onset  . Heart attack Mother   . Cerebral aneurysm Father   . Coronary artery disease Neg Hx   . Diabetes Neg Hx   . Hypertension Neg Hx      Social History   Socioeconomic History  . Marital status: Married    Spouse name: None  . Number of children: None  . Years of education: None  . Highest education level: None  Social Needs  . Financial resource strain: None  . Food insecurity -  worry: None  . Food insecurity - inability: None  . Transportation needs - medical: None  . Transportation needs - non-medical: None  Occupational History  . Occupation: retired  Tobacco Use  . Smoking status: Current Every Day Smoker    Packs/day: 1.00    Years: 57.00    Pack years: 57.00    Types: Cigarettes  . Smokeless tobacco: Never Used  Substance and Sexual Activity  . Alcohol use: Yes    Alcohol/week: 0.0 oz  . Drug use: No  . Sexual activity: No  Other Topics Concern  . None  Social History Narrative   Had been married for over 41 years and wife passed away in December 21, 2015 from cancer. Has children and grandchildren and great grandchildren. Gets together with family for dinner frequently. Lives alone and cooks for self. Live in [place]. 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 Eats all food groups.      Review of Systems  Constitutional: Positive for appetite change and fatigue.  Psychiatric/Behavioral: Positive for decreased concentration and depression. The patient has insomnia.      Objective:   BP 124/80 (BP Location: Left Arm, Patient Position: Sitting, Cuff Size: Normal)   Pulse 75   Temp 97.7 F (36.5 C) (Other (Comment))   Resp 16   Ht 5\' 11"  (1.803 m)   Wt 238 lb 4 oz (108.1 kg)   SpO2 98%   BMI 33.23 kg/m   Physical Exam  Constitutional: He appears well-developed and well-nourished. No distress.  HENT:  Head: Normocephalic and atraumatic.  Eyes: EOM are normal. Pupils are equal, round, and reactive to light.  Pulmonary/Chest: Effort normal and breath sounds normal.  Abdominal: Soft. Bowel sounds are normal.  Skin: Skin is warm and dry.  Psychiatric: Judgment and thought content normal. His mood appears not anxious. His affect is not blunt, not labile and not inappropriate. His speech is not delayed, not tangential and not slurred. He is slowed. He is not agitated, not aggressive, not  hyperactive, not withdrawn, not actively hallucinating and not combative. Cognition and memory are normal. He does not express impulsivity or inappropriate judgment. He exhibits a depressed mood. He expresses no homicidal and no suicidal ideation. He expresses no suicidal plans and no homicidal plans. He is communicative. He is attentive.   Depression screen PHQ 2/9 08/05/2017  Decreased Interest 3  Down, Depressed, Hopeless 3  PHQ - 2 Score 6  Altered  sleeping 3  Tired, decreased energy 2  Change in appetite 3  Feeling bad or failure about yourself  1  Trouble concentrating 2  Moving slowly or fidgety/restless 1  Suicidal thoughts 1  PHQ-9 Score 19  Difficult doing work/chores Somewhat difficult   Assessment and Plan  1. Depression, major, single episode, severe ([place]) Suicide risks evaluated and documented in note if present or in the area below.  Patient does not have/denies the following risks: previous suicide attempts, family history of suicide, access to lethal means, prior history of psychiatric disorder, history of alcohol or substance abuse disorder, recent loss of a loved one, or severe hopelessness. Patient denies access to firearms or if present will have removed from home/access.   Patient has protective factors of family and community support.  Patient reports that family believes is behaving rationally. Patient displays problem solving skills.   Patient specifically denies suicide ideation. Patient has access/information to healthcare contacts if situation or mood changes where patient is a risk to self or others or mood becomes unstable.   During the encounter, the patient had good eye contact and firm handshake regarding safety contract and agreement to seek help if mood worsens and not to harm self.   Patient understands the treatment plan and is in agreement. Agrees to keep follow up and call prior or return to clinic if needed.   - citalopram (CELEXA) 20 MG tablet;  Take 1 tablet (20 mg total) by mouth daily.  Dispense: 30 tablet; Refill: 3 -Treatment of depression, medications, mechanism of action, possible side effects were discussed with patient and his sons today.  Patient's 2 sons reports that his grandson is living with him now and he has multiple daily visits from family.  They report that they will continue to check on him.  Patient was in agreement to start medication.  He also was in agreement to restart meals with his family.  Discussed with patient and his sons today that Xanax was not an appropriate medication for management of depression.  We discussed risks of benzodiazepine use including addiction, sedation, impeding grief and treatment of mood disorder, and fall risk secondary to patient's age. 2. Mixed hyperlipidemia Patient was started on this medication in September.  We will plan to recheck LDL/cholesterol panel today. - rosuvastatin (CRESTOR) 20 MG tablet; Take 1 tablet (20 mg total) by mouth at bedtime.  Dispense: 90 tablet; Refill: 3 - Lipid panel  3. Essential hypertension Blood pressure controlled.  Refill medication. - amLODipine (NORVASC) 5 MG tablet; Take 1 tablet (5 mg total) by mouth every other day.  Dispense: 90 tablet; Refill: 1  4. Insomnia, unspecified type We will refill trazodone, but patient will start back at 1/2 tablet p.o. nightly. - traZODone (DESYREL) 100 MG tablet; Take 1/2  tablet (100 mg total) by mouth at bedtime.  Dispense: 90 tablet; Refill: 1  5. High risk medication use - Hepatic function panel  6. Elevated serum creatinine Labs revealed increased creatinine.  Recheck today.  Suspect secondary to history of uncontrolled blood pressure. - Basic metabolic panel . 7. Coronary artery disease involving coronary bypass graft of native heart without angina pectoris Keep scheduled follow-up with Dr. Domenic Polite later in the week Continue aspirin daily. I asked patient today to discuss aspirin dosing with Dr.  Domenic Polite.  Patient reports that he was told by cardiology to take aspirin 325 mg each day.  Return in about 2 weeks (around 08/19/2017). Caren Macadam, MD 08/05/2017

## 2017-08-07 ENCOUNTER — Ambulatory Visit: Payer: Medicare HMO | Admitting: Cardiology

## 2017-08-07 ENCOUNTER — Encounter: Payer: Self-pay | Admitting: Cardiology

## 2017-08-07 VITALS — BP 140/98 | HR 97 | Ht 71.0 in | Wt 240.0 lb

## 2017-08-07 DIAGNOSIS — I2510 Atherosclerotic heart disease of native coronary artery without angina pectoris: Secondary | ICD-10-CM | POA: Diagnosis not present

## 2017-08-07 DIAGNOSIS — E782 Mixed hyperlipidemia: Secondary | ICD-10-CM

## 2017-08-07 DIAGNOSIS — Z720 Tobacco use: Secondary | ICD-10-CM

## 2017-08-07 DIAGNOSIS — I10 Essential (primary) hypertension: Secondary | ICD-10-CM | POA: Diagnosis not present

## 2017-08-07 LAB — BASIC METABOLIC PANEL
BUN: 12 mg/dL (ref 7–25)
CHLORIDE: 103 mmol/L (ref 98–110)
CO2: 32 mmol/L (ref 20–32)
CREATININE: 1.04 mg/dL (ref 0.70–1.18)
Calcium: 8.9 mg/dL (ref 8.6–10.3)
GLUCOSE: 108 mg/dL (ref 65–139)
Potassium: 3.5 mmol/L (ref 3.5–5.3)
Sodium: 142 mmol/L (ref 135–146)

## 2017-08-07 LAB — LIPID PANEL
CHOLESTEROL: 114 mg/dL (ref <200)
HDL: 36 mg/dL — AB (ref >40)
LDL Cholesterol (Calc): 57 mg/dL (calc)
NON-HDL CHOLESTEROL (CALC): 78 mg/dL (ref <130)
TRIGLYCERIDES: 129 mg/dL (ref <150)
Total CHOL/HDL Ratio: 3.2 (calc) (ref <5.0)

## 2017-08-07 LAB — HEPATIC FUNCTION PANEL
AG Ratio: 1.6 (calc) (ref 1.0–2.5)
ALBUMIN MSPROF: 3.7 g/dL (ref 3.6–5.1)
ALT: 10 U/L (ref 9–46)
AST: 11 U/L (ref 10–35)
Alkaline phosphatase (APISO): 64 U/L (ref 40–115)
BILIRUBIN DIRECT: 0.1 mg/dL (ref 0.0–0.2)
BILIRUBIN INDIRECT: 0.4 mg/dL (ref 0.2–1.2)
BILIRUBIN TOTAL: 0.5 mg/dL (ref 0.2–1.2)
GLOBULIN: 2.3 g/dL (ref 1.9–3.7)
Total Protein: 6 g/dL — ABNORMAL LOW (ref 6.1–8.1)

## 2017-08-07 MED ORDER — ASPIRIN EC 81 MG PO TBEC: 81.0000 mg | DELAYED_RELEASE_TABLET | Freq: Every day | ORAL | 3 refills | Status: DC

## 2017-08-07 NOTE — Patient Instructions (Signed)
Medication Instructions:  Your physician has recommended you make the following change in your medication:   DECREASE Aspirin to 81 mg daily  Please continue all other medications as prescribed   Labwork: NONE  Testing/Procedures: NONE  Follow-Up: Your physician wants you to follow-up in: [place]. You will receive a reminder letter in the mail two months in advance. If you don't receive a letter, please call our office to schedule the follow-up appointment.  Any Other Special Instructions Will Be Listed Below (If Applicable).  If you need a refill on your cardiac medications before your next appointment, please call your pharmacy.

## 2017-08-07 NOTE — Progress Notes (Signed)
Cardiology Office Note  Date: 08/07/2017   ID: Jack [name], DOB [birth year], MRN [id number]  PCP: Caren Macadam, MD  Primary Cardiologist: Jack Lesches, MD   Chief Complaint  Patient presents with  . Coronary Artery Disease    History of Present Illness: Jack [name] is a 71 y.o. male last seen in December 2017.  He presents for a routine follow-up visit.  PCP is now Dr. Mannie [name], I reviewed the recent notes and lab work.  A cardiac perspective, [name] does not report any active angina symptoms.  He has chronic dyspnea on exertion, reports no substantial change in stamina.  Current cardiac regimen includes aspirin, Norvasc, and Crestor.  We discussed reducing his aspirin to 81 mg daily.  He has not required nitroglycerin.  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  Last ischemic testing was in 2016 showing evidence of previous inferior infarct but no substantial ischemic burden.  Past Medical History:  Diagnosis Date  . Coronary atherosclerosis of native coronary artery    Multivessel status post CABG  . HLD (hyperlipidemia)   . Hypertension   . Tobacco user     Past Surgical History:  Procedure Laterality Date  . APPENDECTOMY     age 24  . CHOLECYSTECTOMY  2003  . CORONARY ARTERY BYPASS GRAFT  2008   LIMA to LAD, SVG to ramus, SVG to PDA and PLA  . FOOT SURGERY    . KNEE ARTHROPLASTY    . ROTATOR CUFF REPAIR    . SHOULDER SURGERY      Current Outpatient Medications  Medication Sig Dispense Refill  . amLODipine (NORVASC) 5 MG tablet Take 1 tablet (5 mg total) by mouth every other day. 90 tablet 1  . aspirin 325 MG tablet Take 325 mg by mouth every other day.     . Cholecalciferol (VITAMIN D3) 5000 units CAPS Take 1 capsule by mouth every other day.     . citalopram (CELEXA) 20 MG tablet Take 1 tablet (20 mg total) by mouth daily. 30 tablet 3  .  oxyCODONE-acetaminophen (PERCOCET) 10-325 MG tablet Take 1 tablet by mouth every 8 (eight) hours as needed for pain. 90 tablet 0  . rosuvastatin (CRESTOR) 20 MG tablet Take 1 tablet (20 mg total) by mouth at bedtime. 90 tablet 3  . traZODone (DESYREL) 100 MG tablet Take 1 tablet (100 mg total) by mouth at bedtime. 90 tablet 1   No current facility-administered medications for this visit.    Allergies:  Patient has no known allergies.   Social History: The patient  reports that he has been smoking cigarettes.  He has a 57.00 pack-year smoking history. he has never used smokeless tobacco. He reports that he drinks alcohol. He reports that he does not use drugs.   ROS:  Please see the history of present illness. Otherwise, complete review of systems is positive for depressed mood.  All other systems are reviewed and negative.   Physical Exam: VS:  BP (!) 140/98   Pulse 97   Ht 5\' 11"  (1.803 m)   Wt 240 lb (108.9 kg)   SpO2 98%   BMI 33.47 kg/m , BMI Body mass index is 33.47 kg/m.  Wt Readings from Last 3 Encounters:  08/07/17 240 lb (108.9 kg)  08/05/17 238 lb 4 oz (108.1 kg)  07/20/17 245 lb (111.1 kg)    General: Obese male,  no distress. HEENT: Conjunctiva and lids normal, oropharynx clear. Neck: Supple, no elevated JVP or carotid bruits, no thyromegaly. Lungs: Diminished breath sounds without wheezing, nonlabored breathing at rest. Cardiac: Regular rate and rhythm, no S3, soft systolic murmur, no pericardial rub. Abdomen: Obese, nontender, bowel sounds present. Extremities: Mild ankle edema, distal pulses 2+. Skin: Warm and dry. Musculoskeletal: No kyphosis. Neuropsychiatric: Alert and oriented x3, affect grossly appropriate.  ECG: I personally reviewed the tracing from 11/06/2015 which showed sinus tachycardia with PVCs and ventricular couplet and nonspecific T wave changes.  Recent Labwork: 05/06/2017: ALT 13; AST 12; BUN 12; Creat 1.45; Hemoglobin 17.2; Platelets 178;  Potassium 4.9; Sodium 141; TSH 2.39     Component Value Date/Time   CHOL 206 (H) 05/06/2017 1128   TRIG 190 (H) 05/06/2017 1128   HDL 35 (L) 05/06/2017 1128   CHOLHDL 5.9 (H) 05/06/2017 1128    Other Studies Reviewed Today:  Lexiscan Myoview 03/21/2015:  Findings consistent with prior myocardial infarction. Evidence of prior inferior infarct with no peri-infarct ischemia  This is an intermediate risk study. Intermediate risk due to mildly decreased LVEF, there is no current myocardium at jeopardy.  The left ventricular ejection fraction is mildly decreased (45-54%).  Assessment and Plan:  1.  CAD status post CABG in 2008.  He does not report any progressive angina symptoms on current medical regimen and had no significant ischemic territories by [facility] in 2016.  ECG reviewed.  Continue with observation.  Reduce aspirin to 81 mg daily.  2.  Hyperlipidemia.  He reports compliance with Crestor.  Follow-up lipids are pending.  Previous LDL was 138 in September.  3.  Essential hypertension, he continues on Norvasc.  Blood pressure elevated today, but was well controlled at office visit on December 12 with PCP.  No changes made.  4.  Tobacco abuse.  Smoking cessation has been discussed.  He has not been motivated to quit.  Current medicines were reviewed with the patient today.   Orders Placed This Encounter  Procedures  . EKG 12-Lead    Disposition: Follow-up in 1 year.  Signed, Satira Sark, MD, [facility] 08/07/2017 9:37 AM    [facility] [place], [place], [place] [address] Phone: [phone]; Fax: [phone]

## 2017-08-10 ENCOUNTER — Encounter: Payer: Self-pay | Admitting: Family Medicine

## 2017-08-19 ENCOUNTER — Other Ambulatory Visit: Payer: Self-pay | Admitting: Student in an Organized Health Care Education/Training Program

## 2017-08-26 ENCOUNTER — Encounter: Payer: Self-pay | Admitting: Family Medicine

## 2017-08-26 ENCOUNTER — Other Ambulatory Visit: Payer: Self-pay

## 2017-08-26 ENCOUNTER — Ambulatory Visit (INDEPENDENT_AMBULATORY_CARE_PROVIDER_SITE_OTHER): Payer: Medicare HMO | Admitting: Family Medicine

## 2017-08-26 VITALS — BP 110/70 | HR 83 | Temp 97.5°F | Resp 15 | Ht 71.0 in | Wt 238.2 lb

## 2017-08-26 DIAGNOSIS — F324 Major depressive disorder, single episode, in partial remission: Secondary | ICD-10-CM | POA: Diagnosis not present

## 2017-08-26 DIAGNOSIS — R42 Dizziness and giddiness: Secondary | ICD-10-CM

## 2017-08-26 DIAGNOSIS — R251 Tremor, unspecified: Secondary | ICD-10-CM | POA: Diagnosis not present

## 2017-08-26 DIAGNOSIS — K5900 Constipation, unspecified: Secondary | ICD-10-CM | POA: Diagnosis not present

## 2017-08-26 LAB — GLUCOSE, POCT (MANUAL RESULT ENTRY): POC GLUCOSE: 110 mg/dL — AB (ref 70–99)

## 2017-08-26 MED ORDER — CITALOPRAM HYDROBROMIDE 40 MG PO TABS: 40.0000 mg | ORAL_TABLET | Freq: Every day | ORAL | 0 refills | Status: DC

## 2017-08-26 MED ORDER — DOCUSATE SODIUM 100 MG PO CAPS
100.0000 mg | ORAL_CAPSULE | Freq: Two times a day (BID) | ORAL | 0 refills | Status: DC
Start: 2017-08-26 — End: 2018-02-02

## 2017-08-26 NOTE — Progress Notes (Signed)
Patient ID: Jack [name], male    DOB: Feb 14, 1946, 72 y.o.   MRN: [id number]  Chief Complaint  Patient presents with  . Depression    has not worsened or improved  . Insomnia    Allergies Patient has no known allergies.  Subjective:   Jack [name] is a 72 y.o. male who presents to [facility] today.  HPI Patient presents today with his grandson, Rodman Key, for follow-up visit.  He has been taking the Celexa 20 mg 1 p.o. daily for the past approximate 5 weeks.  He reports that his mood is a little bit better.  Has not had any suicidal ideations.  Still feels down.  Does not feel as sad as he did before.  Has not been crying on a daily basis.  Has been seeing his family almost every day.  Reports that his appetite is still low and does not have a taste for food.  Reports he is not been eating very much.  Denies any side effects with the medication.  He has been seeing his pain medicine doctor in [place] and he did not get enough of his pain medicines at the last visit per patient report.  He reports that because of this he has been having to take the pain medicine less than prescribed which makes him not feel as well.  Reports that he has not eaten today and feels a little bit shaky and dizzy.  Reports that he just does not feel great when he does not have his pain medicine.  Energy is about the same.  Has seen cardiology since his last visit here.  Report with Dr. Domenic Polite was good.  All medications were continued except his aspirin was decreased to 81 mg a day.  Reports that he has been a little bit constipated but does not drink much fluid or has not been eating much.  Denies any abdominal pain.  No nausea, vomiting, or diarrhea.  Denies any difficulty or pain with swallowing.  Reports he has not been sleeping well and feels tired during the day.  Reports when he has his pain medicines he feels like he has more energy and is more steady on his feet.  Has been taking one half of  the trazodone pill each night to help him sleep.  Depression         This is a new problem.  The current episode started more than 1 month ago.   The onset quality is gradual.   The problem occurs daily.  The problem has been gradually improving since onset.  Associated symptoms include fatigue, insomnia, decreased interest and appetite change.  Associated symptoms include no decreased concentration, no helplessness, no hopelessness, not irritable, no restlessness, no myalgias, no headaches, not sad and no suicidal ideas.     The symptoms are aggravated by family issues.  Past treatments include SSRIs - Selective serotonin reuptake inhibitors.  Compliance with treatment is good.   Past Medical History:  Diagnosis Date  . Coronary atherosclerosis of native coronary artery    Multivessel status post CABG  . HLD (hyperlipidemia)   . Hypertension   . Tobacco user     Past Surgical History:  Procedure Laterality Date  . APPENDECTOMY     age 40  . CHOLECYSTECTOMY  2003  . CORONARY ARTERY BYPASS GRAFT  2008   LIMA to LAD, SVG to ramus, SVG to PDA and PLA  . FOOT SURGERY    . KNEE  ARTHROPLASTY    . ROTATOR CUFF REPAIR    . SHOULDER SURGERY      Family History  Problem Relation Age of Onset  . Heart attack Mother   . Cerebral aneurysm Father   . Coronary artery disease Neg Hx   . Diabetes Neg Hx   . Hypertension Neg Hx      Social History   Socioeconomic History  . Marital status: Widowed    Spouse name: None  . Number of children: None  . Years of education: None  . Highest education level: None  Social Needs  . Financial resource strain: None  . Food insecurity - worry: None  . Food insecurity - inability: None  . Transportation needs - medical: None  . Transportation needs - non-medical: None  Occupational History  . Occupation: retired  Tobacco Use  . Smoking status: Current Every Day Smoker    Packs/day: 1.00    Years: 57.00    Pack years: 57.00    Types:  Cigarettes  . Smokeless tobacco: Never Used  Substance and Sexual Activity  . Alcohol use: Yes    Alcohol/week: 0.0 oz  . Drug use: No  . Sexual activity: No  Other Topics Concern  . None  Social History Narrative   Had been married for over 8 years and wife passed away in 11/28/15 from cancer. Has children and grandchildren and great grandchildren. Gets together with family for dinner frequently. Lives alone and cooks for self. Live in [place]. 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 Eats all food groups.     Current Outpatient Medications on File Prior to Visit  Medication Sig Dispense Refill  . amLODipine (NORVASC) 5 MG tablet Take 1 tablet (5 mg total) by mouth every other day. 90 tablet 1  . aspirin EC 81 MG tablet Take 1 tablet (81 mg total) by mouth daily. 90 tablet 3  . Cholecalciferol (VITAMIN D3) 5000 units CAPS Take 1 capsule by mouth every other day.     . oxyCODONE-acetaminophen (PERCOCET) 10-325 MG tablet Take 1 tablet by mouth every 8 (eight) hours as needed for pain. 90 tablet 0  . rosuvastatin (CRESTOR) 20 MG tablet Take 1 tablet (20 mg total) by mouth at bedtime. 90 tablet 3   No current facility-administered medications on file prior to visit.     Review of Systems  Constitutional: Positive for appetite change and fatigue.  HENT: Negative for trouble swallowing and voice change.   Eyes: Negative for visual disturbance.  Respiratory: Negative for choking, chest tightness, shortness of breath, wheezing and stridor.   Cardiovascular: Negative for chest pain, palpitations and leg swelling.  Gastrointestinal: Negative for abdominal pain, anal bleeding, blood in stool, constipation, diarrhea, nausea, rectal pain and vomiting.       Patient reports that he saw some blood from his bellybutton about a week ago.  He reports that it was dark in color.  He has not had any pain or associated symptoms.    Genitourinary: Negative for decreased urine volume, difficulty urinating, dysuria and urgency.       Upon questioning, patient reports that he has had urinary frequency for many years.  He reports that he wakes up at night and then gets up to go to the bathroom because he is awake.  He denies any painful urination or urinary hesitancy.  He reports he does not want to see a urologist or be  evaluated for this at this time.  He denies any hematuria.  Musculoskeletal: Negative for myalgias.  Skin: Negative for rash and wound.  Neurological: Negative for tremors, seizures, facial asymmetry, speech difficulty, weakness, numbness and headaches.  Psychiatric/Behavioral: Positive for depression. Negative for decreased concentration and suicidal ideas. The patient has insomnia.      Objective:   BP 110/70 (BP Location: Left Arm, Patient Position: Sitting, Cuff Size: Normal)   Pulse 83   Temp (!) 97.5 F (36.4 C) (Other (Comment))   Resp 15   Ht 5\' 11"  (1.803 m)   Wt 238 lb 4 oz (108.1 kg)   SpO2 98%   BMI 33.23 kg/m   Physical Exam  Constitutional: He is oriented to person, place, and time. He appears well-developed and well-nourished. He is not irritable.  HENT:  Head: Normocephalic and atraumatic.  Eyes: EOM are normal. Pupils are equal, round, and reactive to light.  Cardiovascular: Normal rate, regular rhythm and normal heart sounds.  Pulmonary/Chest: Effort normal and breath sounds normal.  Abdominal: Soft. Bowel sounds are normal. He exhibits no distension. There is no tenderness.  Abdomen soft, nondistended nontender.  No rebound or guarding.  Bowel sounds present.  Neurological: He is alert and oriented to person, place, and time. No cranial nerve deficit.  Skin: Skin is warm and dry.  Inspection of umbilicus reveals no drainage, surrounding skin disturbance or evidence of infection.  No evidence of any drainage or lesion at umbilicus.  Psychiatric: Judgment normal. His affect is  blunt. His affect is not labile and not inappropriate. His speech is delayed. His speech is not rapid and/or pressured. He is slowed. He is not actively hallucinating. Cognition and memory are normal. He exhibits a depressed mood. He expresses no homicidal and no suicidal ideation. He expresses no suicidal plans and no homicidal plans.  Affect congruent with mood.  Mood depressed.  Patient with improved mood from last office visit.  No crying during exam today.  Denies suicidal or homicidal ideations.  No auditory or visual hallucinations present.  Behavior within normal limits.  Thought process is goal-directed. He is attentive.  Vitals reviewed.    Assessment and Plan   1. Depression, major, single episode, in partial remission (HCC) Increase Celexa to 40 mg p.o. Daily. Patient counseled in detail regarding the risks of medication. Told to call or return to clinic if develop any worrisome signs or symptoms. Patient voiced understanding.  Suicide risks evaluated and documented in note if present or in the area below.  Patient does not have/denies the following risks: previous suicide attempts, family history of suicide, access to lethal means, prior history of psychiatric disorder, history of alcohol or substance abuse disorder, recent loss of a loved one, or severe hopelessness. Patient denies access to firearms or if present will have removed from home/access.   Patient has protective factors of family and community support.  Patient reports that family believes is behaving rationally. Patient displays problem solving skills.   Patient specifically denies suicide ideation. Patient has access/information to healthcare contacts if situation or mood changes where patient is a risk to self or others or mood becomes unstable.   During the encounter, the patient had good eye contact and firm handshake regarding safety contract and agreement to seek help if mood worsens and not to harm self.    Patient understands the treatment plan and is in agreement. Agrees to keep follow up and call prior or return to clinic if needed.  Did  discuss with patient that at this time we will go ahead and place a referral to psychiatry and get an appointment scheduled so that if his mood was not beginning to improve that we could get psychiatry on board for help with medication.  Spoke with son on the phone today.  He voiced understanding and agreement with treatment plan.  The family will continue to visit patient frequently, almost daily. - citalopram (CELEXA) 40 MG tablet; Take 1 tablet (40 mg total) by mouth daily.  Dispense: 30 tablet; Refill: 0 - Ambulatory referral to Psychiatry  2. Constipation, unspecified constipation type Increase water and diet as directed.  Also concerning worrisome signs and symptoms and if occur will call or return to office. - docusate sodium (COLACE) 100 MG capsule; Take 1 capsule (100 mg total) by mouth 2 (two) times daily.  Dispense: 10 capsule; Refill: 0  3. Dizziness During office visit today, patient reported feeling shaky and dizzy.  It was approximately 2:15 in the afternoon and patient reportedly had not eaten or had anything to drink today.  He was given a soda and candy in office.  After 5 minutes he reportedly felt much better.  Symptoms resolved.  Fingerstick blood sugar was checked which was 101.  Today diet, regularly scheduled eating, and maintenance of nutrition plan was discussed with grandson and patient.  He voiced understanding and agreement that he would eat regularly scheduled meals and he understands that if he does not eat meals that it puts him at increased risk for low blood sugars and problems associated with that. - POCT glucose (manual entry) OV was 30 minutes. Greater than 50% spent counseling and coordinating care. Discussed with patient, his son, and grandson.    Return in about 4 weeks (around 09/23/2017) for follow up. Caren Macadam,  MD 08/26/2017

## 2017-09-01 ENCOUNTER — Other Ambulatory Visit: Payer: Self-pay

## 2017-09-01 ENCOUNTER — Encounter: Payer: Self-pay | Admitting: Student in an Organized Health Care Education/Training Program

## 2017-09-01 ENCOUNTER — Ambulatory Visit
Payer: Medicare HMO | Attending: Student in an Organized Health Care Education/Training Program | Admitting: Student in an Organized Health Care Education/Training Program

## 2017-09-01 VITALS — BP 176/75 | HR 65 | Temp 97.7°F | Resp 16 | Ht 70.0 in | Wt 238.0 lb

## 2017-09-01 DIAGNOSIS — M19012 Primary osteoarthritis, left shoulder: Secondary | ICD-10-CM

## 2017-09-01 DIAGNOSIS — Z79899 Other long term (current) drug therapy: Secondary | ICD-10-CM | POA: Insufficient documentation

## 2017-09-01 DIAGNOSIS — M47816 Spondylosis without myelopathy or radiculopathy, lumbar region: Secondary | ICD-10-CM | POA: Diagnosis not present

## 2017-09-01 DIAGNOSIS — G894 Chronic pain syndrome: Secondary | ICD-10-CM | POA: Diagnosis not present

## 2017-09-01 DIAGNOSIS — F17210 Nicotine dependence, cigarettes, uncomplicated: Secondary | ICD-10-CM | POA: Insufficient documentation

## 2017-09-01 DIAGNOSIS — M170 Bilateral primary osteoarthritis of knee: Secondary | ICD-10-CM | POA: Diagnosis not present

## 2017-09-01 DIAGNOSIS — Z9889 Other specified postprocedural states: Secondary | ICD-10-CM | POA: Diagnosis not present

## 2017-09-01 DIAGNOSIS — G8929 Other chronic pain: Secondary | ICD-10-CM | POA: Diagnosis not present

## 2017-09-01 DIAGNOSIS — M545 Low back pain: Secondary | ICD-10-CM | POA: Diagnosis not present

## 2017-09-01 DIAGNOSIS — Z79891 Long term (current) use of opiate analgesic: Secondary | ICD-10-CM | POA: Insufficient documentation

## 2017-09-01 DIAGNOSIS — Z7982 Long term (current) use of aspirin: Secondary | ICD-10-CM | POA: Diagnosis not present

## 2017-09-01 DIAGNOSIS — I10 Essential (primary) hypertension: Secondary | ICD-10-CM | POA: Insufficient documentation

## 2017-09-01 DIAGNOSIS — M19011 Primary osteoarthritis, right shoulder: Secondary | ICD-10-CM | POA: Insufficient documentation

## 2017-09-01 DIAGNOSIS — M25511 Pain in right shoulder: Secondary | ICD-10-CM | POA: Diagnosis not present

## 2017-09-01 DIAGNOSIS — M25512 Pain in left shoulder: Secondary | ICD-10-CM | POA: Diagnosis not present

## 2017-09-01 DIAGNOSIS — Z5181 Encounter for therapeutic drug level monitoring: Secondary | ICD-10-CM | POA: Insufficient documentation

## 2017-09-01 DIAGNOSIS — I2510 Atherosclerotic heart disease of native coronary artery without angina pectoris: Secondary | ICD-10-CM | POA: Insufficient documentation

## 2017-09-01 DIAGNOSIS — Z9049 Acquired absence of other specified parts of digestive tract: Secondary | ICD-10-CM | POA: Insufficient documentation

## 2017-09-01 DIAGNOSIS — E785 Hyperlipidemia, unspecified: Secondary | ICD-10-CM | POA: Diagnosis not present

## 2017-09-01 MED ORDER — OXYCODONE-ACETAMINOPHEN 10-325 MG PO TABS: 1.0000 | ORAL_TABLET | Freq: Three times a day (TID) | ORAL | 0 refills | Status: DC | PRN

## 2017-09-01 NOTE — Progress Notes (Signed)
Nursing Pain Medication Assessment:  Safety precautions to be maintained throughout the outpatient stay will include: orient to surroundings, keep bed in low position, maintain call bell within reach at all times, provide assistance with transfer out of bed and ambulation.  Medication Inspection Compliance: Pill count conducted under aseptic conditions, in front of the patient. Neither the pills nor the bottle was removed from the patient's sight at any time. Once count was completed pills were immediately returned to the patient in their original bottle.  Medication: Oxycodone/APAP Pill/Patch Count: 0 of 90 pills remain Pill/Patch Appearance: Markings consistent with prescribed medication Bottle Appearance: Standard pharmacy container. Clearly labeled. Filled Date: 11/26 / 2018 Last Medication intake:  Today

## 2017-09-01 NOTE — Patient Instructions (Signed)
You have been given 3 scripts for percocet today. 

## 2017-09-01 NOTE — Progress Notes (Signed)
Patient's Name: Jack [name]  MRN: [id number]  Referring Provider: Caren Macadam, MD  DOB: 1945/09/22  PCP: Caren Macadam, MD  DOS: 09/01/2017  Note by: [name], MD  Service setting: Ambulatory outpatient  Specialty: Interventional Pain Management  Location: [facility] (AMB) Pain Management Facility    Patient type: Established   Primary Reason(s) for Visit: Encounter for prescription drug management. (Level of risk: moderate)  CC: Back Pain (lower)  HPI  Mr. Urbas is a 72 y.o. year old, male patient, who comes today for a medication management evaluation. He has HYPERLIPIDEMIA-MIXED; Tobacco abuse, in remission; Essential hypertension, benign; Coronary atherosclerosis of native coronary artery; Hypertension; Osteoarthritis of both shoulders; Lumbar spondylosis; Lumbar degenerative disc disease; Chronic pain of both shoulders; Chronic prescription benzodiazepine use; and Chronic pain syndrome on their problem list. His primarily concern today is the Back Pain (lower)  Pain Assessment: Location: Lower Back Radiating: both hips Onset: More than a month ago Duration: Chronic pain Quality: Nagging Severity: 4 /10 (self-reported pain score)  Note: Reported level is inconsistent with clinical observations. Clinically the patient looks like a 3/10 A 3/10 is viewed as "Moderate" and described as significantly interfering with activities of daily living (ADL). It becomes difficult to feed, bathe, get dressed, get on and off the toilet or to perform personal hygiene functions. Difficult to get in and out of bed or a chair without assistance. Very distracting. With effort, it can be ignored when deeply involved in activities.       When using our objective Pain Scale, levels between 6 and 10/10 are said to belong in an emergency room, as it progressively worsens from a 6/10, described as severely limiting, requiring emergency care not usually available at an outpatient pain management facility. At a 6/10  level, communication becomes difficult and requires great effort. Assistance to reach the emergency department may be required. Facial flushing and profuse sweating along with potentially dangerous increases in heart rate and blood pressure will be evident. Effect on ADL:   Timing: Intermittent Modifying factors: medications  Mr. Truxillo was last scheduled for an appointment on 08/19/2017 for medication management. During today's appointment we reviewed Mr. Shedd's chronic pain status, as well as his outpatient medication regimen.  The patient  reports that he does not use drugs. His body mass index is 34.15 kg/m.  Further details on both, my assessment(s), as well as the proposed treatment plan, please see below.  Controlled Substance Pharmacotherapy Assessment REMS (Risk Evaluation and Mitigation Strategy)  Analgesic: Percocet 10 mg 3 times daily as needed, quantity 90 a month MME/day: 45 mg/day.  Landis Martins, RN  09/01/2017 10:31 AM  Sign at close encounter Nursing Pain Medication Assessment:  Safety precautions to be maintained throughout the outpatient stay will include: orient to surroundings, keep bed in low position, maintain call bell within reach at all times, provide assistance with transfer out of bed and ambulation.  Medication Inspection Compliance: Pill count conducted under aseptic conditions, in front of the patient. Neither the pills nor the bottle was removed from the patient's sight at any time. Once count was completed pills were immediately returned to the patient in their original bottle.  Medication: Oxycodone/APAP Pill/Patch Count: 0 of 90 pills remain Pill/Patch Appearance: Markings consistent with prescribed medication Bottle Appearance: Standard pharmacy container. Clearly labeled. Filled Date: 11/26 / 2018 Last Medication intake:  Today   Pharmacokinetics: Liberation and absorption (onset of action): WNL Distribution (time to peak effect):  WNL Metabolism and excretion (duration  of action): WNL         Pharmacodynamics: Desired effects: Analgesia: Mr. Ferencz reports >50% benefit. Functional ability: Patient reports that medication allows him to accomplish basic ADLs Clinically meaningful improvement in function (CMIF): Sustained CMIF goals met Perceived effectiveness: Described as relatively effective, allowing for increase in activities of daily living (ADL) Undesirable effects: Side-effects or Adverse reactions: None reported Monitoring: [place] PMP: Online review of the past 79-monthperiod conducted. Compliant with practice rules and regulations Last UDS on record: Summary  Date Value Ref Range Status  06/09/2017 FINAL  Final    Comment:    ==================================================================== TOX[facility]RE COMP DRUG ANALYSIS,UR ==================================================================== Test                             Result       Flag       Units Drug Present and Declared for Prescription Verification   Trazodone                      PRESENT      EXPECTED   1,3 chlorophenyl piperazine    PRESENT      EXPECTED    1,3-chlorophenyl piperazine is an expected metabolite of    trazodone.   Acetaminophen                  PRESENT      EXPECTED   Salicylate                     PRESENT      EXPECTED Drug Present not Declared for Prescription Verification   Alprazolam                     18           UNEXPECTED ng/mg creat   Alpha-hydroxyalprazolam        32           UNEXPECTED ng/mg creat    Source of alprazolam is a scheduled prescription medication.    Alpha-hydroxyalprazolam is an expected metabolite of alprazolam.   Hydrocodone                    457          UNEXPECTED ng/mg creat   Dihydrocodeine                 250          UNEXPECTED ng/mg creat   Norhydrocodone                 659          UNEXPECTED ng/mg creat    Sources of hydrocodone include scheduled prescription    medications.  Dihydrocodeine and norhydrocodone are expected    metabolites of hydrocodone. Dihydrocodeine is also available as a    scheduled prescription medication.   Venlafaxine                    PRESENT      UNEXPECTED   Desmethylvenlafaxine           PRESENT      UNEXPECTED    Desmethylvenlafaxine is an expected metabolite of venlafaxine. Drug Absent but Declared for Prescription Verification   Oxycodone                      Not Detected UNEXPECTED ng/mg creat  Tizanidine                     Not Detected UNEXPECTED    Tizanidine, as indicated in the declared medication list, is not    always detected even when used as directed. ==================================================================== Test                      Result    Flag   Units      Ref Range   Creatinine              180              mg/dL      >=20 ==================================================================== Declared Medications:  The flagging and interpretation on this report are based on the  following declared medications.  Unexpected results may arise from  inaccuracies in the declared medications.  **Note: The testing scope of this panel includes these medications:  Oxycodone (Percocet)  Trazodone  **Note: The testing scope of this panel does not include small to  moderate amounts of these reported medications:  Acetaminophen (Percocet)  Aspirin  Tizanidine  **Note: The testing scope of this panel does not include following  reported medications:  Amlodipine (Norvasc)  Carvedilol (Coreg)  Rosuvastatin (Crestor)  Vitamin D3 ==================================================================== For clinical consultation, please call [phone]. ====================================================================    UDS interpretation: Compliant          Medication Assessment Form: Reviewed. Patient indicates being compliant with therapy Treatment compliance: Compliant Risk Assessment  Profile: Aberrant behavior: See prior evaluations. None observed or detected today Comorbid factors increasing risk of overdose: See prior notes. No additional risks detected today Risk of substance use disorder (SUD): Low Opioid Risk Tool - 07/20/17 1008      Family History of Substance Abuse   Alcohol  Negative    Illegal Drugs  Negative    Rx Drugs  Negative      Personal History of Substance Abuse   Alcohol  Negative    Illegal Drugs  Negative    Rx Drugs  Negative      Age   Age between 74-45 years   No      History of Preadolescent Sexual Abuse   History of Preadolescent Sexual Abuse  Negative or Male      Psychological Disease   Psychological Disease  Negative    Depression  Negative      Total Score   Opioid Risk Tool Scoring  0    Opioid Risk Interpretation  Low Risk      ORT Scoring interpretation table:  Score <3 = Low Risk for SUD  Score between 4-7 = Moderate Risk for SUD  Score >8 = High Risk for Opioid Abuse   Risk Mitigation Strategies:  Patient Counseling: Covered Patient-Prescriber Agreement (PPA): Present and active  Notification to other healthcare providers: Done  Pharmacologic Plan: No change in therapy, at this time.             Laboratory Chemistry  Inflammation Markers (CRP: Acute Phase) (ESR: Chronic Phase) No results found for: CRP, ESRSEDRATE, LATICACIDVEN               Rheumatology Markers No results found for: RF, ANA, LABURIC, URICUR, LYMEIGGIGMAB, [facility]              Renal Function Markers Lab Results  Component Value Date   BUN 12 08/07/2017   CREATININE 1.04 08/07/2017  Hepatic Function Markers Lab Results  Component Value Date   AST 11 08/07/2017   ALT 10 08/07/2017                 Electrolytes Lab Results  Component Value Date   NA 142 08/07/2017   K 3.5 08/07/2017   CL 103 08/07/2017   CALCIUM 8.9 08/07/2017                 Neuropathy Markers Lab Results  Component Value Date   HGBA1C  5.2 05/06/2017                 Bone Pathology Markers Lab Results  Component Value Date   VD25OH 47 05/06/2017                 Coagulation Parameters Lab Results  Component Value Date   PLT 178 05/06/2017                 Cardiovascular Markers Lab Results  Component Value Date   HGB 17.2 (H) 05/06/2017   HCT 51.4 (H) 05/06/2017                 CA Markers No results found for: CEA, CA125, LABCA2               Note: Lab results reviewed.  Recent Diagnostic Imaging Results  DG Lumbar Spine Complete CLINICAL DATA:  Low back pain, lifting injury  EXAM: LUMBAR SPINE - COMPLETE 4+ VIEW  COMPARISON:  Lumbar spine radiographs dated 02/08/2015  FINDINGS: Five lumbar-type vertebral bodies.  Normal lumbar lordosis.  Mild lumbar levoscoliosis.  No evidence of fracture or dislocation. Vertebral body heights are maintained.  Mild to moderate multilevel degenerative changes.  Visualized bony pelvis appears intact.  Cholecystectomy clips.  Vascular calcifications.  10 mm calcification overlying the left lower abdomen, possibly reflecting a left lower pole renal calculus, previously 8 mm in 2016.  IMPRESSION: Mild to moderate degenerative changes.  No fracture or dislocation is seen.  Possible 10 mm left lower pole renal calculus, previously 8 mm.  Electronically Signed   By: Julian Hy M.D.   On: 06/29/2017 13:55  Complexity Note: Imaging results reviewed. Results shared with Mr. Blowe, using Layman's terms.                         Meds   Current Outpatient Medications:  .  amLODipine (NORVASC) 5 MG tablet, Take 1 tablet (5 mg total) by mouth every other day., Disp: 90 tablet, Rfl: 1 .  aspirin EC 81 MG tablet, Take 1 tablet (81 mg total) by mouth daily., Disp: 90 tablet, Rfl: 3 .  Cholecalciferol (VITAMIN D3) 5000 units CAPS, Take 1 capsule by mouth every other day. , Disp: , Rfl:  .  citalopram (CELEXA) 40 MG tablet, Take 1 tablet (40 mg total) by  mouth daily., Disp: 30 tablet, Rfl: 0 .  docusate sodium (COLACE) 100 MG capsule, Take 1 capsule (100 mg total) by mouth 2 (two) times daily., Disp: 10 capsule, Rfl: 0 .  oxyCODONE-acetaminophen (PERCOCET) 10-325 MG tablet, Take 1 tablet by mouth every 8 (eight) hours as needed for pain., Disp: 90 tablet, Rfl: 0 .  rosuvastatin (CRESTOR) 20 MG tablet, Take 1 tablet (20 mg total) by mouth at bedtime., Disp: 90 tablet, Rfl: 3  ROS  Constitutional: Denies any fever or chills Gastrointestinal: No reported hemesis, hematochezia, vomiting, or acute GI distress Musculoskeletal: Denies any acute onset  joint swelling, redness, loss of ROM, or weakness Neurological: No reported episodes of acute onset apraxia, aphasia, dysarthria, agnosia, amnesia, paralysis, loss of coordination, or loss of consciousness  Allergies  Mr. Everingham has No Known Allergies.  [place]  Drug: Mr. Kakar  reports that he does not use drugs. Alcohol:  reports that he drinks alcohol. Tobacco:  reports that he has been smoking cigarettes.  He has a 57.00 pack-year smoking history. he has never used smokeless tobacco. Medical:  has a past medical history of Coronary atherosclerosis of native coronary artery, HLD (hyperlipidemia), Hypertension, and Tobacco user. Surgical: Mr. Sult  has a past surgical history that includes Foot surgery; Appendectomy; Knee Arthroplasty; Coronary artery bypass graft (2008); Rotator cuff repair; Shoulder surgery; and Cholecystectomy (2003). Family: family history includes Cerebral aneurysm in his father; Heart attack in his mother.  Constitutional Exam  General appearance: Well nourished, well developed, and well hydrated. In no apparent acute distress Vitals:   09/01/17 1025  BP: (!) 176/75  Pulse: 65  Resp: 16  Temp: 97.7 F (36.5 C)  TempSrc: Oral  SpO2: 95%  Weight: 238 lb (108 kg)  Height: 5' 10" (1.778 m)   BMI Assessment: Estimated body mass index is 34.15 kg/m as calculated from  the following:   Height as of this encounter: 5' 10" (1.778 m).   Weight as of this encounter: 238 lb (108 kg).  BMI interpretation table: BMI level Category Range association with higher incidence of chronic pain  <18 kg/m2 Underweight   18.5-24.9 kg/m2 Ideal body weight   25-29.9 kg/m2 Overweight Increased incidence by 20%  30-34.9 kg/m2 Obese (Class I) Increased incidence by 68%  35-39.9 kg/m2 Severe obesity (Class II) Increased incidence by 136%  >40 kg/m2 Extreme obesity (Class III) Increased incidence by 254%   BMI Readings from Last 4 Encounters:  09/01/17 34.15 kg/m  08/26/17 33.23 kg/m  08/07/17 33.47 kg/m  08/05/17 33.23 kg/m   Wt Readings from Last 4 Encounters:  09/01/17 238 lb (108 kg)  08/26/17 238 lb 4 oz (108.1 kg)  08/07/17 240 lb (108.9 kg)  08/05/17 238 lb 4 oz (108.1 kg)  Psych/Mental status: Alert, oriented x 3 (person, place, & time)       Eyes: PERLA Respiratory: No evidence of acute respiratory distress  Cervical Spine Area Exam  Skin & Axial Inspection: No masses, redness, edema, swelling, or associated skin lesions Alignment: Symmetrical Functional ROM: Unrestricted ROM      Stability: No instability detected Muscle Tone/Strength: Functionally intact. No obvious neuro-muscular anomalies detected. Sensory (Neurological): Unimpaired Palpation: No palpable anomalies              Upper Extremity (UE) Exam    Side: Right upper extremity  Side: Left upper extremity  Skin & Extremity Inspection: Skin color, temperature, and hair growth are WNL. No peripheral edema or cyanosis. No masses, redness, swelling, asymmetry, or associated skin lesions. No contractures.  Skin & Extremity Inspection: Skin color, temperature, and hair growth are WNL. No peripheral edema or cyanosis. No masses, redness, swelling, asymmetry, or associated skin lesions. No contractures.  Functional ROM: Unrestricted ROM          Functional ROM: Unrestricted ROM          Muscle  Tone/Strength: Functionally intact. No obvious neuro-muscular anomalies detected.  Muscle Tone/Strength: Functionally intact. No obvious neuro-muscular anomalies detected.  Sensory (Neurological): Unimpaired          Sensory (Neurological): Unimpaired  Palpation: No palpable anomalies              Palpation: No palpable anomalies              Specialized Test(s): Deferred         Specialized Test(s): Deferred          Thoracic Spine Area Exam  Skin & Axial Inspection: No masses, redness, or swelling Alignment: Symmetrical Functional ROM: Unrestricted ROM Stability: No instability detected Muscle Tone/Strength: Functionally intact. No obvious neuro-muscular anomalies detected. Sensory (Neurological): Unimpaired Muscle strength & Tone: No palpable anomalies  Lumbar Spine Area Exam  Skin & Axial Inspection: No masses, redness, or swelling Alignment: Symmetrical Functional ROM: Decreased ROM      Stability: No instability detected Muscle Tone/Strength: Functionally intact. No obvious neuro-muscular anomalies detected. Sensory (Neurological): Articular pain pattern Palpation: Complains of area being tender to palpation       Provocative Tests: Lumbar Hyperextension and rotation test: Positive bilaterally for facet joint pain. Lumbar Lateral bending test: evaluation deferred today       Patrick's Maneuver: Positive for bilateral S-I arthralgia              Gait & Posture Assessment  Ambulation: Limited Gait: Age-related, senile gait pattern Posture: Difficulty standing up straight, due to pain   Lower Extremity Exam    Side: Right lower extremity  Side: Left lower extremity  Skin & Extremity Inspection: Skin color, temperature, and hair growth are WNL. No peripheral edema or cyanosis. No masses, redness, swelling, asymmetry, or associated skin lesions. No contractures.  Skin & Extremity Inspection: Skin color, temperature, and hair growth are WNL. No peripheral edema or cyanosis.  No masses, redness, swelling, asymmetry, or associated skin lesions. No contractures.  Functional ROM: Improved after treatment          Functional ROM: Improved after treatment          Muscle Tone/Strength: Functionally intact. No obvious neuro-muscular anomalies detected.  Muscle Tone/Strength: Functionally intact. No obvious neuro-muscular anomalies detected.  Sensory (Neurological): Musculoskeletal pain pattern  Sensory (Neurological): Musculoskeletal pain pattern  Palpation: No palpable anomalies  Palpation: No palpable anomalies   Assessment  Primary Diagnosis & Pertinent Problem List: The primary encounter diagnosis was Primary osteoarthritis of both knees. Diagnoses of Chronic pain syndrome, Osteoarthritis of both shoulders, unspecified osteoarthritis type, Chronic pain of both shoulders, and Lumbar spondylosis were also pertinent to this visit.  Status Diagnosis  Stable Persistent Persistent 1. Primary osteoarthritis of both knees   2. Chronic pain syndrome   3. Osteoarthritis of both shoulders, unspecified osteoarthritis type   4. Chronic pain of both shoulders   5. Lumbar spondylosis      General Recommendations: The pain condition that the patient suffers from is best treated with a multidisciplinary approach that involves an increase in physical activity to prevent de-conditioning and worsening of the pain cycle, as well as psychological counseling (formal and/or informal) to address the co-morbid psychological affects of pain. Treatment will often involve judicious use of pain medications and interventional procedures to decrease the pain, allowing the patient to participate in the physical activity that will ultimately produce long-lasting pain reductions. The goal of the multidisciplinary approach is to return the patient to a higher level of overall function and to restore their ability to perform activities of daily living.  72 year old male who presents with bilateral  shoulder pain (history of left shoulder rotator cuff repair, told that he needs to have his right shoulder surgically  repaired but wants to avoid), bilateral knee pain secondary to advanced osteoarthritis, and axial low back pain secondary to lumbar degenerative disc disease and lumbar spondylosis.  Patient is status post a series of 3 intra-articular Hyalgan knee injections bilaterally along with a right posterior shoulder joint injection with steroid over the last 2 months.  Patient does note improvement in his knee pain and inability to ambulate for longer period of time without as much discomfort.  Furthermore the patient does note improvement in his right shoulder pain but states that it is returning however not to pre-block levels.  Today I will refill the patient's oxycodone for 3 months.  Note: PMP was checked and appropriate.  UDS is up-to-date and appropriate.  No additional benzodiazepine fills per PMP.  For the patient's lower back pain that radiates into his buttocks, we discussed lumbar medial branch blocks with steroid.  The patient is interested but wants to hold off at this time.  We can consider in the future.  Plan: -70-monthprescription for oxycodone at its current dose of 10 mill grams 3 times daily as needed as below -Continue Tylenol and ibuprofen as needed -Consideration of lumbar medial branch facet blocks at next visit. -Follow-up in 3 months.  Time Note: Greater than 50% of the 25 minute(s) of face-to-face time spent with Mr. EHeffington was spent in counseling/coordination of care regarding: the appropriate use of the pain scale, opioid tolerance, the treatment plan, treatment alternatives, medication side effects, going over the informed consent, the opioid analgesic risks and possible complications, the appropriate use of his medications and realistic expectations.  Plan of Care  Pharmacotherapy (Medications Ordered): Meds ordered this encounter  Medications  . DISCONTD:  oxyCODONE-acetaminophen (PERCOCET) 10-325 MG tablet    Sig: Take 1 tablet by mouth every 8 (eight) hours as needed for pain.    Dispense:  90 tablet    Refill:  0    Do not place this medication, or any other prescription from our practice, on "Automatic Refill". Patient may have prescription filled one day early if pharmacy is closed on scheduled refill date. Fill on or after: 09/01/17, 10/01/17, 10/28/17 For chronic pain To last 30 days from fill date  . DISCONTD: oxyCODONE-acetaminophen (PERCOCET) 10-325 MG tablet    Sig: Take 1 tablet by mouth every 8 (eight) hours as needed for pain.    Dispense:  90 tablet    Refill:  0    Do not place this medication, or any other prescription from our practice, on "Automatic Refill". Patient may have prescription filled one day early if pharmacy is closed on scheduled refill date. Fill on or after: 09/01/17, 10/01/17, 10/28/17 For chronic pain To last 30 days from fill date  . oxyCODONE-acetaminophen (PERCOCET) 10-325 MG tablet    Sig: Take 1 tablet by mouth every 8 (eight) hours as needed for pain.    Dispense:  90 tablet    Refill:  0    Do not place this medication, or any other prescription from our practice, on "Automatic Refill". Patient may have prescription filled one day early if pharmacy is closed on scheduled refill date. Fill on or after: 09/01/17, 10/01/17, 10/28/17 For chronic pain To last 30 days from fill date    Pharmacological management options:   Considering:   -bilateral intra-articular halogen injections (completed)- helped 11/26 -Right posterior shoulder joint injection- helped - 10/22 -Lumbar medial branch nerve blocks (discuss further with pt at next visit and schedule) -SI joint injection  Provider-requested  follow-up: Return in about 3 months (around 11/17/2017) for Medication Management.  Future Appointments  Date Time Provider [place]  09/24/2017  9:40 AM Caren Macadam, MD [facility]    Primary Care Physician:  Caren Macadam, MD Location: [facility] Note by: [name], M.D Date: 09/01/2017; Time: 10:46 AM  Patient Instructions  You have been given 3 scripts for percocet today.

## 2017-09-24 ENCOUNTER — Ambulatory Visit: Payer: Medicare HMO | Admitting: Family Medicine

## 2017-10-21 ENCOUNTER — Encounter: Payer: Self-pay | Admitting: Family Medicine

## 2017-10-21 ENCOUNTER — Other Ambulatory Visit: Payer: Self-pay

## 2017-10-21 ENCOUNTER — Ambulatory Visit (INDEPENDENT_AMBULATORY_CARE_PROVIDER_SITE_OTHER): Payer: Medicare HMO | Admitting: Family Medicine

## 2017-10-21 VITALS — BP 140/72 | HR 96 | Temp 98.0°F | Resp 16 | Ht 70.0 in | Wt 224.0 lb

## 2017-10-21 DIAGNOSIS — F324 Major depressive disorder, single episode, in partial remission: Secondary | ICD-10-CM | POA: Diagnosis not present

## 2017-10-21 DIAGNOSIS — E785 Hyperlipidemia, unspecified: Secondary | ICD-10-CM | POA: Diagnosis not present

## 2017-10-21 DIAGNOSIS — I10 Essential (primary) hypertension: Secondary | ICD-10-CM | POA: Diagnosis not present

## 2017-10-21 DIAGNOSIS — Z720 Tobacco use: Secondary | ICD-10-CM | POA: Diagnosis not present

## 2017-10-21 MED ORDER — CITALOPRAM HYDROBROMIDE 40 MG PO TABS: 40.0000 mg | ORAL_TABLET | Freq: Every day | ORAL | 1 refills | Status: DC

## 2017-10-21 NOTE — Progress Notes (Signed)
Patient ID: Jack [name], male    DOB: 1946/07/24, 72 y.o.   MRN: [id number]  Chief Complaint  Patient presents with  . Follow-up    Allergies Patient has no known allergies.  Subjective:   Jack [name] is a 72 y.o. male who presents to [facility] today.  HPI Mr. Fahrner presents today for a follow-up visit.  He has been taking the Celexa 40 mg a day since his last visit.  He reports that he is feeling much better.  His grandson, Rodman Key, is with him today.  Rodman Key reports that his grandfather is more like his old self.  He reports he has been getting out of the house more and even asking the family to take him places.  He has been going to their house for dinner.  His appetite is improved.  He reports that he is not sad, down, or depressed.  He denies any suicidal or homicidal ideations.  He reports his energy level is better.  He is sleeping well.  He has interest in getting outside, taking walks, and messing around his house.  He is not crying.  He reports he has been taking his blood pressure medication.  Denies any chest pain, shortness of breath, swelling in his extremities.  No side effects with medication.  Taking his cholesterol medication each night.  No myalgias.  Still smoking cigarettes on a daily basis.  Is not interested in quitting at this time.  Reports he knows the risks.    Past Medical History:  Diagnosis Date  . Coronary atherosclerosis of native coronary artery    Multivessel status post CABG  . HLD (hyperlipidemia)   . Hypertension   . Tobacco user     Past Surgical History:  Procedure Laterality Date  . APPENDECTOMY     age 17  . CHOLECYSTECTOMY  12-03-01  . CORONARY ARTERY BYPASS GRAFT  04-Dec-2006   LIMA to LAD, SVG to ramus, SVG to PDA and PLA  . FOOT SURGERY    . KNEE ARTHROPLASTY    . ROTATOR CUFF REPAIR    . SHOULDER SURGERY      Family History  Problem Relation Age of Onset  . Heart attack Mother   . Cerebral aneurysm Father   .  Coronary artery disease Neg Hx   . Diabetes Neg Hx   . Hypertension Neg Hx      Social History   Socioeconomic History  . Marital status: Widowed    Spouse name: None  . Number of children: None  . Years of education: None  . Highest education level: None  Social Needs  . Financial resource strain: None  . Food insecurity - worry: None  . Food insecurity - inability: None  . Transportation needs - medical: None  . Transportation needs - non-medical: None  Occupational History  . Occupation: retired  Tobacco Use  . Smoking status: Current Every Day Smoker    Packs/day: 1.00    Years: 57.00    Pack years: 57.00    Types: Cigarettes  . Smokeless tobacco: Never Used  Substance and Sexual Activity  . Alcohol use: Yes    Alcohol/week: 0.0 oz  . Drug use: No  . Sexual activity: No  Other Topics Concern  . None  Social History Narrative   Had been married for over 66 years and wife passed away in 12-04-2015 from cancer. Has children and grandchildren and great grandchildren. Gets together with family for dinner  frequently. Lives alone and cooks for self. Live in [place]. 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 Eats all food groups.     Current Outpatient Medications on File Prior to Visit  Medication Sig Dispense Refill  . amLODipine (NORVASC) 5 MG tablet Take 1 tablet (5 mg total) by mouth every other day. 90 tablet 1  . aspirin EC 81 MG tablet Take 1 tablet (81 mg total) by mouth daily. 90 tablet 3  . Cholecalciferol (VITAMIN D3) 5000 units CAPS Take 1 capsule by mouth every other day.     . docusate sodium (COLACE) 100 MG capsule Take 1 capsule (100 mg total) by mouth 2 (two) times daily. 10 capsule 0  . oxyCODONE-acetaminophen (PERCOCET) 10-325 MG tablet Take 1 tablet by mouth every 8 (eight) hours as needed for pain. 90 tablet 0  . rosuvastatin (CRESTOR) 20 MG tablet Take 1 tablet (20 mg total) by mouth at  bedtime. 90 tablet 3   No current facility-administered medications on file prior to visit.     Review of Systems  Constitutional: Negative for activity change, chills, diaphoresis, fatigue, fever and unexpected weight change.  Eyes: Negative for visual disturbance.  Respiratory: Negative for cough, chest tightness and shortness of breath.   Cardiovascular: Negative for chest pain.  Gastrointestinal: Negative for diarrhea, nausea and vomiting.  Genitourinary: Negative for dysuria and frequency.  Musculoskeletal: Negative for myalgias.  Skin: Negative for rash.  Neurological: Negative for dizziness, tremors, syncope, weakness, light-headedness, numbness and headaches.  Hematological: Negative for adenopathy. Does not bruise/bleed easily.  Psychiatric/Behavioral: Negative for agitation, behavioral problems, decreased concentration, dysphoric mood, hallucinations, self-injury and suicidal ideas. The patient is not nervous/anxious.      Objective:   BP (!) 150/76 (BP Location: Left Arm, Patient Position: Sitting, Cuff Size: Normal)   Pulse (!) 112   Temp 98 F (36.7 C) (Temporal)   Resp 16   Ht 5\' 10"  (1.778 m)   Wt 224 lb (101.6 kg)   SpO2 98%   BMI 32.14 kg/m   Physical Exam  Constitutional: He is oriented to person, place, and time. He appears well-developed and well-nourished.  HENT:  Head: Normocephalic and atraumatic.  Eyes: EOM are normal. Pupils are equal, round, and reactive to light.  Neck: Normal range of motion. Neck supple. No thyromegaly present.  Cardiovascular: Normal rate, regular rhythm and normal heart sounds.  Pulmonary/Chest: Effort normal and breath sounds normal.  Musculoskeletal: He exhibits no edema.  Neurological: He is alert and oriented to person, place, and time. No cranial nerve deficit.  Skin: Skin is warm, dry and intact.  Psychiatric: He has a normal mood and affect. His behavior is normal. Judgment and thought content normal.  Vitals  reviewed.  Depression screen [facility] 2/9 09/01/2017 08/26/2017 08/05/2017 07/20/2017 06/29/2017  Decreased Interest 0 3 3 0 0  Down, Depressed, Hopeless 0 3 3 0 0  PHQ - 2 Score 0 6 6 0 0  Altered sleeping - 3 3 - -  Tired, decreased energy - 3 2 - -  Change in appetite - 3 3 - -  Feeling bad or failure about yourself  - 0 1 - -  Trouble concentrating - 0 2 - -  Moving slowly or fidgety/restless - 0 1 - -  Suicidal thoughts - 0 1 - -  PHQ-9 Score - 15 19 - -  Difficult doing work/chores - - Somewhat difficult - -  Assessment and Plan  1. Depression, major, single episode, in partial remission ([place]) Improved and patient doing much better. Continue medications. Patient counseled in detail regarding the risks of medication. Told to call or return to clinic if develop any worrisome signs or symptoms. Patient voiced understanding.  Suicide risks evaluated and documented in note if present or in the area below.  Patient does not have/denies the following risks: previous suicide attempts, family history of suicide, access to lethal means, prior history of psychiatric disorder, history of alcohol or substance abuse disorder, recent loss of a loved one, or severe hopelessness. Patient denies access to firearms or if present will have removed from home/access.   Patient has protective factors of family and community support.  Patient reports that family believes is behaving rationally. Patient displays problem solving skills.   Patient specifically denies suicide ideation. Patient has access/information to healthcare contacts if situation or mood changes where patient is a risk to self or others or mood becomes unstable.   During the encounter, the patient had good eye contact and firm handshake regarding safety contract and agreement to seek help if mood worsens and not to harm self.   Patient understands the treatment plan and is in agreement. Agrees to keep follow up and call prior or return to  clinic if needed.   - citalopram (CELEXA) 40 MG tablet; Take 1 tablet (40 mg total) by mouth daily.  Dispense: 90 tablet; Refill: 1  2. Essential hypertension, benign Continue medication as directed.  3. Hyperlipidemia LDL goal <70 Hyperlipidemia and the associated risk of ASCVD were discussed today.  Risks of stains discussed including myopathy, rhabdomyoloysis, liver problems, increased risk of diabetes discussed. We discussed heart healthy diet, lifestyle modifications, risk factor modifications, and adherence to the recommended treatment plan. We discussed the need to periodically monitor lipid panel and liver function tests while on statin therapy.    4. Tobacco abuse The 5 A's Model for treating Tobacco Use and Dependence was used today. I have identified and documented tobacco use status for this patient. I have urged the patient to quit tobacco use. At this time, the patient is unwilling and not ready to attempt to quit. I have provided patient with information regarding risks, cessation techniques, and interventions that might increase future attempts to quit smoking. I will plan on again addressing tobacco dependence at the next visit.   Return in about 3 months (around 01/18/2018) for follow up. Caren Macadam, MD 10/21/2017

## 2017-11-10 ENCOUNTER — Encounter: Payer: Self-pay | Admitting: Student in an Organized Health Care Education/Training Program

## 2017-11-10 ENCOUNTER — Other Ambulatory Visit: Payer: Self-pay

## 2017-11-10 ENCOUNTER — Ambulatory Visit
Payer: Medicare HMO | Attending: Student in an Organized Health Care Education/Training Program | Admitting: Student in an Organized Health Care Education/Training Program

## 2017-11-10 VITALS — BP 118/71 | HR 85 | Temp 98.0°F | Resp 16 | Ht 71.0 in | Wt 224.0 lb

## 2017-11-10 DIAGNOSIS — G894 Chronic pain syndrome: Secondary | ICD-10-CM | POA: Diagnosis not present

## 2017-11-10 DIAGNOSIS — M19011 Primary osteoarthritis, right shoulder: Secondary | ICD-10-CM | POA: Diagnosis not present

## 2017-11-10 DIAGNOSIS — F1390 Sedative, hypnotic, or anxiolytic use, unspecified, uncomplicated: Secondary | ICD-10-CM | POA: Insufficient documentation

## 2017-11-10 DIAGNOSIS — I2510 Atherosclerotic heart disease of native coronary artery without angina pectoris: Secondary | ICD-10-CM | POA: Diagnosis not present

## 2017-11-10 DIAGNOSIS — E785 Hyperlipidemia, unspecified: Secondary | ICD-10-CM | POA: Diagnosis not present

## 2017-11-10 DIAGNOSIS — M25511 Pain in right shoulder: Secondary | ICD-10-CM | POA: Insufficient documentation

## 2017-11-10 DIAGNOSIS — M19012 Primary osteoarthritis, left shoulder: Secondary | ICD-10-CM | POA: Diagnosis not present

## 2017-11-10 DIAGNOSIS — M25562 Pain in left knee: Secondary | ICD-10-CM | POA: Insufficient documentation

## 2017-11-10 DIAGNOSIS — M170 Bilateral primary osteoarthritis of knee: Secondary | ICD-10-CM | POA: Insufficient documentation

## 2017-11-10 DIAGNOSIS — I10 Essential (primary) hypertension: Secondary | ICD-10-CM | POA: Diagnosis not present

## 2017-11-10 DIAGNOSIS — M5136 Other intervertebral disc degeneration, lumbar region: Secondary | ICD-10-CM | POA: Insufficient documentation

## 2017-11-10 DIAGNOSIS — Z7982 Long term (current) use of aspirin: Secondary | ICD-10-CM | POA: Diagnosis not present

## 2017-11-10 DIAGNOSIS — G8929 Other chronic pain: Secondary | ICD-10-CM | POA: Diagnosis not present

## 2017-11-10 DIAGNOSIS — M25561 Pain in right knee: Secondary | ICD-10-CM | POA: Diagnosis present

## 2017-11-10 DIAGNOSIS — Z79899 Other long term (current) drug therapy: Secondary | ICD-10-CM | POA: Diagnosis not present

## 2017-11-10 DIAGNOSIS — M47816 Spondylosis without myelopathy or radiculopathy, lumbar region: Secondary | ICD-10-CM | POA: Insufficient documentation

## 2017-11-10 DIAGNOSIS — Z951 Presence of aortocoronary bypass graft: Secondary | ICD-10-CM | POA: Diagnosis not present

## 2017-11-10 DIAGNOSIS — M25512 Pain in left shoulder: Secondary | ICD-10-CM | POA: Diagnosis not present

## 2017-11-10 MED ORDER — OXYCODONE-ACETAMINOPHEN 10-325 MG PO TABS: 1.0000 | ORAL_TABLET | Freq: Three times a day (TID) | ORAL | 0 refills | Status: DC | PRN

## 2017-11-10 NOTE — Progress Notes (Signed)
Patient's Name: Jack [name]  MRN: [id number]  Referring Provider: Caren Macadam, MD  DOB: 22-Jul-1946  PCP: Caren Macadam, MD  DOS: 11/10/2017  Note by: [name], MD  Service setting: Ambulatory outpatient  Specialty: Interventional Pain Management  Location: [facility] (AMB) Pain Management Facility    Patient type: Established   Primary Reason(s) for Visit: Encounter for prescription drug management. (Level of risk: moderate)  CC: Knee Pain (bilateral) and Shoulder Pain (right)  HPI  Jack [name] is a 72 y.o. year old, male patient, who comes today for a medication management evaluation. He has Hyperlipidemia LDL goal <70; Tobacco abuse; Essential hypertension, benign; Coronary atherosclerosis of native coronary artery; Hypertension; Osteoarthritis of both shoulders; Lumbar spondylosis; Lumbar degenerative disc disease; Chronic pain of both shoulders; Chronic prescription benzodiazepine use; and Chronic pain syndrome on their problem list. His primarily concern today is the Knee Pain (bilateral) and Shoulder Pain (right)  Pain Assessment: Location: Right Shoulder Radiating: na Onset: More than a month ago Duration: Chronic pain Quality: Aching Severity: 0-No pain/10 (self-reported pain score)  Note: Reported level is compatible with observation.                         When using our objective Pain Scale, levels between 6 and 10/10 are said to belong in an emergency room, as it progressively worsens from a 6/10, described as severely limiting, requiring emergency care not usually available at an outpatient pain management facility. At a 6/10 level, communication becomes difficult and requires great effort. Assistance to reach the emergency department may be required. Facial flushing and profuse sweating along with potentially dangerous increases in heart rate and blood pressure will be evident. Effect on ADL: aches some in am until pt gets moving Timing: Constant Modifying factors: medications,  walking  Jack [name] was last scheduled for an appointment on 09/01/2017 for medication management. During today's appointment we reviewed Jack [name]'s chronic pain status, as well as his outpatient medication regimen.  The patient  reports that he does not use drugs. His body mass index is 31.24 kg/m.  Further details on both, my assessment(s), as well as the proposed treatment plan, please see below.  Controlled Substance Pharmacotherapy Assessment REMS (Risk Evaluation and Mitigation Strategy)  Analgesic: Percocet 10 mg 3 times daily as needed, quantity 90 a month MME/day: 45 mg/day.   Ignatius Specking, RN  11/10/2017 10:16 AM  Sign at close encounter Nursing Pain Medication Assessment:  Safety precautions to be maintained throughout the outpatient stay will include: orient to surroundings, keep bed in low position, maintain call bell within reach at all times, provide assistance with transfer out of bed and ambulation.  Medication Inspection Compliance: Pill count conducted under aseptic conditions, in front of the patient. Neither the pills nor the bottle was removed from the patient's sight at any time. Once count was completed pills were immediately returned to the patient in their original bottle.  Medication: Oxycodone/APAP Pill/Patch Count: 60 of 90 pills remain Pill/Patch Appearance: Markings consistent with prescribed medication Bottle Appearance: Standard pharmacy container. Clearly labeled. Filled Date: 3 / 09 / 2019 Last Medication intake:  Today   Pharmacokinetics: Liberation and absorption (onset of action): WNL Distribution (time to peak effect): WNL Metabolism and excretion (duration of action): WNL         Pharmacodynamics: Desired effects: Analgesia: Jack [name] reports >50% benefit. Functional ability: Patient reports that medication allows him to accomplish basic ADLs Clinically meaningful improvement in  function (CMIF): Sustained CMIF goals met Perceived  effectiveness: Described as relatively effective, allowing for increase in activities of daily living (ADL) Undesirable effects: Side-effects or Adverse reactions: None reported Monitoring: [place] PMP: Online review of the past 58-monthperiod conducted. Compliant with practice rules and regulations Last UDS on record: Summary  Date Value Ref Range Status  06/09/2017 FINAL  Final    Comment:    ==================================================================== [facility] COMP DRUG ANALYSIS,UR ==================================================================== Test                             Result       Flag       Units Drug Present and Declared for Prescription Verification   Trazodone                      PRESENT      EXPECTED   1,3 chlorophenyl piperazine    PRESENT      EXPECTED    1,3-chlorophenyl piperazine is an expected metabolite of    trazodone.   Acetaminophen                  PRESENT      EXPECTED   Salicylate                     PRESENT      EXPECTED Drug Present not Declared for Prescription Verification   Alprazolam                     18           UNEXPECTED ng/mg creat   Alpha-hydroxyalprazolam        32           UNEXPECTED ng/mg creat    Source of alprazolam is a scheduled prescription medication.    Alpha-hydroxyalprazolam is an expected metabolite of alprazolam.   Hydrocodone                    457          UNEXPECTED ng/mg creat   Dihydrocodeine                 250          UNEXPECTED ng/mg creat   Norhydrocodone                 659          UNEXPECTED ng/mg creat    Sources of hydrocodone include scheduled prescription    medications. Dihydrocodeine and norhydrocodone are expected    metabolites of hydrocodone. Dihydrocodeine is also available as a    scheduled prescription medication.   Venlafaxine                    PRESENT      UNEXPECTED   Desmethylvenlafaxine           PRESENT      UNEXPECTED    Desmethylvenlafaxine is an expected metabolite of  venlafaxine. Drug Absent but Declared for Prescription Verification   Oxycodone                      Not Detected UNEXPECTED ng/mg creat   Tizanidine                     Not Detected UNEXPECTED    Tizanidine, as indicated in the declared medication  list, is not    always detected even when used as directed. ==================================================================== Test                      Result    Flag   Units      Ref Range   Creatinine              180              mg/dL      >=20 ==================================================================== Declared Medications:  The flagging and interpretation on this report are based on the  following declared medications.  Unexpected results may arise from  inaccuracies in the declared medications.  **Note: The testing scope of this panel includes these medications:  Oxycodone (Percocet)  Trazodone  **Note: The testing scope of this panel does not include small to  moderate amounts of these reported medications:  Acetaminophen (Percocet)  Aspirin  Tizanidine  **Note: The testing scope of this panel does not include following  reported medications:  Amlodipine (Norvasc)  Carvedilol (Coreg)  Rosuvastatin (Crestor)  Vitamin D3 ==================================================================== For clinical consultation, please call [phone]. ====================================================================    UDS interpretation: Compliant          Medication Assessment Form: Reviewed. Patient indicates being compliant with therapy Treatment compliance: Compliant Risk Assessment Profile: Aberrant behavior: See prior evaluations. None observed or detected today Comorbid factors increasing risk of overdose: See prior notes. No additional risks detected today Risk of substance use disorder (SUD): Low Opioid Risk Tool - 11/10/17 1012      Family History of Substance Abuse   Alcohol  Negative    Illegal Drugs   Negative    Rx Drugs  Negative      Personal History of Substance Abuse   Alcohol  Negative    Illegal Drugs  Negative    Rx Drugs  Negative      Psychological Disease   Psychological Disease  Positive    ADD  Negative    OCD  Negative    Bipolar  Negative    Schizophrenia  Negative    Depression  Positive      Total Score   Opioid Risk Tool Scoring  3    Opioid Risk Interpretation  Low Risk      ORT Scoring interpretation table:  Score <3 = Low Risk for SUD  Score between 4-7 = Moderate Risk for SUD  Score >8 = High Risk for Opioid Abuse   Risk Mitigation Strategies:  Patient Counseling: Covered Patient-Prescriber Agreement (PPA): Present and active  Notification to other healthcare providers: Done  Pharmacologic Plan: No change in therapy, at this time.             Laboratory Chemistry  Inflammation Markers (CRP: Acute Phase) (ESR: Chronic Phase) No results found for: CRP, ESRSEDRATE, LATICACIDVEN                       Rheumatology Markers No results found for: RF, ANA, Therisa [place], [facility]              Renal Function Markers Lab Results  Component Value Date   BUN 12 08/07/2017   CREATININE 1.04 08/07/2017                 Hepatic Function Markers Lab Results  Component Value Date   AST 11 08/07/2017   ALT 10 08/07/2017  Electrolytes Lab Results  Component Value Date   NA 142 08/07/2017   K 3.5 08/07/2017   CL 103 08/07/2017   CALCIUM 8.9 08/07/2017                        Neuropathy Markers Lab Results  Component Value Date   HGBA1C 5.2 05/06/2017                 Bone Pathology Markers Lab Results  Component Value Date   VD25OH 47 05/06/2017                         Coagulation Parameters Lab Results  Component Value Date   PLT 178 05/06/2017                 Cardiovascular Markers Lab Results  Component Value Date   HGB 17.2 (H) 05/06/2017   HCT 51.4 (H) 05/06/2017                 CA  Markers No results found for: CEA, CA125, LABCA2               Note: Lab results reviewed.  Recent Diagnostic Imaging Results  DG Lumbar Spine Complete CLINICAL DATA:  Low back pain, lifting injury  EXAM: LUMBAR SPINE - COMPLETE 4+ VIEW  COMPARISON:  Lumbar spine radiographs dated 02/08/2015  FINDINGS: Five lumbar-type vertebral bodies.  Normal lumbar lordosis.  Mild lumbar levoscoliosis.  No evidence of fracture or dislocation. Vertebral body heights are maintained.  Mild to moderate multilevel degenerative changes.  Visualized bony pelvis appears intact.  Cholecystectomy clips.  Vascular calcifications.  10 mm calcification overlying the left lower abdomen, possibly reflecting a left lower pole renal calculus, previously 8 mm in 2016.  IMPRESSION: Mild to moderate degenerative changes.  No fracture or dislocation is seen.  Possible 10 mm left lower pole renal calculus, previously 8 mm.  Electronically Signed   By: Julian Hy M.D.   On: 06/29/2017 13:55  Complexity Note: Imaging results reviewed. Results shared with Jack [name], using Layman's terms.                         Meds   Current Outpatient Medications:  .  amLODipine (NORVASC) 5 MG tablet, Take 1 tablet (5 mg total) by mouth every other day., Disp: 90 tablet, Rfl: 1 .  aspirin EC 81 MG tablet, Take 1 tablet (81 mg total) by mouth daily., Disp: 90 tablet, Rfl: 3 .  Cholecalciferol (VITAMIN D3) 5000 units CAPS, Take 1 capsule by mouth every other day. , Disp: , Rfl:  .  citalopram (CELEXA) 40 MG tablet, Take 1 tablet (40 mg total) by mouth daily., Disp: 90 tablet, Rfl: 1 .  docusate sodium (COLACE) 100 MG capsule, Take 1 capsule (100 mg total) by mouth 2 (two) times daily., Disp: 10 capsule, Rfl: 0 .  oxyCODONE-acetaminophen (PERCOCET) 10-325 MG tablet, Take 1 tablet by mouth every 8 (eight) hours as needed for pain., Disp: 90 tablet, Rfl: 0 .  rosuvastatin (CRESTOR) 20 MG tablet, Take 1 tablet  (20 mg total) by mouth at bedtime., Disp: 90 tablet, Rfl: 3  ROS  Constitutional: Denies any fever or chills Gastrointestinal: No reported hemesis, hematochezia, vomiting, or acute GI distress Musculoskeletal: Denies any acute onset joint swelling, redness, loss of ROM, or weakness Neurological: No reported episodes of acute onset apraxia, aphasia, dysarthria, agnosia, amnesia,  paralysis, loss of coordination, or loss of consciousness  Allergies  Jack [name] has No Known Allergies.  [place]  Drug: Jack [name]  reports that he does not use drugs. Alcohol:  reports that he drinks alcohol. Tobacco:  reports that he has been smoking cigarettes.  He has a 57.00 pack-year smoking history. he has never used smokeless tobacco. Medical:  has a past medical history of Coronary atherosclerosis of native coronary artery, HLD (hyperlipidemia), Hypertension, and Tobacco user. Surgical: Jack [name]  has a past surgical history that includes Foot surgery; Appendectomy; Knee Arthroplasty; Coronary artery bypass graft (2008); Rotator cuff repair; Shoulder surgery; and Cholecystectomy (2003). Family: family history includes Cerebral aneurysm in his father; Heart attack in his mother.  Constitutional Exam  General appearance: Well nourished, well developed, and well hydrated. In no apparent acute distress Vitals:   11/10/17 1006  BP: 118/71  Pulse: 85  Resp: 16  Temp: 98 F (36.7 C)  SpO2: 98%  Weight: 224 lb (101.6 kg)  Height: 5' 11" (1.803 m)   BMI Assessment: Estimated body mass index is 31.24 kg/m as calculated from the following:   Height as of this encounter: 5' 11" (1.803 m).   Weight as of this encounter: 224 lb (101.6 kg).  BMI interpretation table: BMI level Category Range association with higher incidence of chronic pain  <18 kg/m2 Underweight   18.5-24.9 kg/m2 Ideal body weight   25-29.9 kg/m2 Overweight Increased incidence by 20%  30-34.9 kg/m2 Obese (Class I) Increased incidence by  68%  35-39.9 kg/m2 Severe obesity (Class II) Increased incidence by 136%  >40 kg/m2 Extreme obesity (Class III) Increased incidence by 254%   BMI Readings from Last 4 Encounters:  11/10/17 31.24 kg/m  10/21/17 32.14 kg/m  09/01/17 34.15 kg/m  08/26/17 33.23 kg/m   Wt Readings from Last 4 Encounters:  11/10/17 224 lb (101.6 kg)  10/21/17 224 lb (101.6 kg)  09/01/17 238 lb (108 kg)  08/26/17 238 lb 4 oz (108.1 kg)  Psych/Mental status: Alert, oriented x 3 (person, place, & time)       Eyes: PERLA Respiratory: No evidence of acute respiratory distress  Cervical Spine Area Exam  Skin & Axial Inspection: No masses, redness, edema, swelling, or associated skin lesions Alignment: Symmetrical Functional ROM: Unrestricted ROM      Stability: No instability detected Muscle Tone/Strength: Functionally intact. No obvious neuro-muscular anomalies detected. Sensory (Neurological): Unimpaired Palpation: No palpable anomalies              Upper Extremity (UE) Exam    Side: Right upper extremity  Side: Left upper extremity  Skin & Extremity Inspection: Skin color, temperature, and hair growth are WNL. No peripheral edema or cyanosis. No masses, redness, swelling, asymmetry, or associated skin lesions. No contractures.  Skin & Extremity Inspection: Skin color, temperature, and hair growth are WNL. No peripheral edema or cyanosis. No masses, redness, swelling, asymmetry, or associated skin lesions. No contractures.  Functional ROM: Unrestricted ROM          Functional ROM: Unrestricted ROM          Muscle Tone/Strength: Functionally intact. No obvious neuro-muscular anomalies detected.  Muscle Tone/Strength: Functionally intact. No obvious neuro-muscular anomalies detected.  Sensory (Neurological): Unimpaired          Sensory (Neurological): Unimpaired          Palpation: No palpable anomalies              Palpation: No palpable anomalies  Specialized Test(s): Deferred          Specialized Test(s): Deferred          Thoracic Spine Area Exam  Skin & Axial Inspection: No masses, redness, or swelling Alignment: Symmetrical Functional ROM: Unrestricted ROM Stability: No instability detected Muscle Tone/Strength: Functionally intact. No obvious neuro-muscular anomalies detected. Sensory (Neurological): Unimpaired Muscle strength & Tone: No palpable anomalies  Lumbar Spine Area Exam  Skin & Axial Inspection: No masses, redness, or swelling Alignment: Symmetrical Functional ROM: Unrestricted ROM      Stability: No instability detected Muscle Tone/Strength: Functionally intact. No obvious neuro-muscular anomalies detected. Sensory (Neurological): Unimpaired Palpation: No palpable anomalies       Provocative Tests: Lumbar Hyperextension and rotation test: Positive bilaterally for facet joint pain. Lumbar Lateral bending test: evaluation deferred today       Patrick's Maneuver: Positive for bilateral S-I arthralgia              Gait & Posture Assessment  Ambulation: Limited Gait: Age-related, senile gait pattern Posture: Difficulty standing up straight, due to pain   Lower Extremity Exam    Side: Right lower extremity  Side: Left lower extremity  Skin & Extremity Inspection: Skin color, temperature, and hair growth are WNL. No peripheral edema or cyanosis. No masses, redness, swelling, asymmetry, or associated skin lesions. No contractures.  Skin & Extremity Inspection: Skin color, temperature, and hair growth are WNL. No peripheral edema or cyanosis. No masses, redness, swelling, asymmetry, or associated skin lesions. No contractures.  Functional ROM: Unrestricted ROM          Functional ROM: Unrestricted ROM          Muscle Tone/Strength: Functionally intact. No obvious neuro-muscular anomalies detected.  Muscle Tone/Strength: Functionally intact. No obvious neuro-muscular anomalies detected.  Sensory (Neurological): Arthropathic arthralgia  Sensory  (Neurological): Arthropathic arthralgia  Palpation: No palpable anomalies  Palpation: No palpable anomalies   Assessment  Primary Diagnosis & Pertinent Problem List: The primary encounter diagnosis was Primary osteoarthritis of both knees. Diagnoses of Chronic pain of both shoulders, Lumbar spondylosis, Lumbar degenerative disc disease, Chronic prescription benzodiazepine use, and Chronic pain syndrome were also pertinent to this visit.  Status Diagnosis  Controlled Controlled Controlled 1. Primary osteoarthritis of both knees   2. Chronic pain of both shoulders   3. Lumbar spondylosis   4. Lumbar degenerative disc disease   5. Chronic prescription benzodiazepine use   6. Chronic pain syndrome      General Recommendations: The pain condition that the patient suffers from is best treated with a multidisciplinary approach that involves an increase in physical activity to prevent de-conditioning and worsening of the pain cycle, as well as psychological counseling (formal and/or informal) to address the co-morbid psychological affects of pain. Treatment will often involve judicious use of pain medications and interventional procedures to decrease the pain, allowing the patient to participate in the physical activity that will ultimately produce long-lasting pain reductions. The goal of the multidisciplinary approach is to return the patient to a higher level of overall function and to restore their ability to perform activities of daily living.  71 year old male who presents with bilateral shoulder pain (history of left shoulder rotator cuff repair, told that he needs to have his right shoulder surgically repaired but wants to avoid), bilateral knee pain secondary to advanced osteoarthritis, and axial low back pain secondary to lumbar degenerative disc disease and lumbar spondylosis.  Patient is status post a series of 3 intra-articular Hyalgan knee injections  bilaterally along with a right  posterior shoulder joint injection with steroid.    Patient presents for follow-up today.  He states that he is doing well on his current medication regimen.  He is taking his medications as prescribed and is been compliant with therapy.  He states that his right shoulder is also doing better.  He is able to do more throughout the day and is endorsing improvement in his functional status.  Plan of Care  Pharmacotherapy (Medications Ordered): Meds ordered this encounter  Medications  . DISCONTD: oxyCODONE-acetaminophen (PERCOCET) 10-325 MG tablet    Sig: Take 1 tablet by mouth every 8 (eight) hours as needed for pain.    Dispense:  90 tablet    Refill:  0    Do not place this medication, or any other prescription from our practice, on "Automatic Refill". Patient may have prescription filled one day early if pharmacy is closed on scheduled refill date. Fill on or after: 11/30/17, 12/29/17, 01/28/18 For chronic pain To last 30 days from fill date  . DISCONTD: oxyCODONE-acetaminophen (PERCOCET) 10-325 MG tablet    Sig: Take 1 tablet by mouth every 8 (eight) hours as needed for pain.    Dispense:  90 tablet    Refill:  0    Do not place this medication, or any other prescription from our practice, on "Automatic Refill". Patient may have prescription filled one day early if pharmacy is closed on scheduled refill date. Fill on or after: 11/30/17, 12/29/17, 01/28/18 For chronic pain To last 30 days from fill date  . oxyCODONE-acetaminophen (PERCOCET) 10-325 MG tablet    Sig: Take 1 tablet by mouth every 8 (eight) hours as needed for pain.    Dispense:  90 tablet    Refill:  0    Do not place this medication, or any other prescription from our practice, on "Automatic Refill". Patient may have prescription filled one day early if pharmacy is closed on scheduled refill date. Fill on or after: 11/30/17, 12/29/17, 01/28/18 For chronic pain To last 30 days from fill date    Considering:   -Lumbar facet medial  branch nerve blocks for lumbar spondylosis -Bilateral SI joint injection -Bilateral genicular nerve block -R shoulder injection    Provider-requested follow-up: Return in about 3 months (around 02/10/2018) for Medication Management. Time Note: Greater than 50% of the 25 minute(s) of face-to-face time spent with Jack [name], was spent in counseling/coordination of care regarding: Mr. Boesen primary cause of pain, the treatment plan, treatment alternatives, the opioid analgesic risks and possible complications, the appropriate use of his medications, realistic expectations, the goals of pain management (increased in functionality) and the medication agreement. Future Appointments  Date Time Provider [place]  02/15/2018  1:30 PM [name], MD [facility] None  02/18/2018 10:40 AM Caren Macadam, MD [facility]    Primary Care Physician: Caren Macadam, MD Location: [facility] Note by: [name], M.D Date: 11/10/2017; Time: 10:38 AM  There are no Patient Instructions on file for this visit.

## 2017-11-10 NOTE — Progress Notes (Signed)
Nursing Pain Medication Assessment:  Safety precautions to be maintained throughout the outpatient stay will include: orient to surroundings, keep bed in low position, maintain call bell within reach at all times, provide assistance with transfer out of bed and ambulation.  Medication Inspection Compliance: Pill count conducted under aseptic conditions, in front of the patient. Neither the pills nor the bottle was removed from the patient's sight at any time. Once count was completed pills were immediately returned to the patient in their original bottle.  Medication: Oxycodone/APAP Pill/Patch Count: 60 of 90 pills remain Pill/Patch Appearance: Markings consistent with prescribed medication Bottle Appearance: Standard pharmacy container. Clearly labeled. Filled Date: 3 / 09 / 2019 Last Medication intake:  Today

## 2018-01-21 ENCOUNTER — Telehealth: Payer: Self-pay | Admitting: Family Medicine

## 2018-01-21 DIAGNOSIS — R1084 Generalized abdominal pain: Secondary | ICD-10-CM

## 2018-01-21 NOTE — Telephone Encounter (Signed)
Patient is requesting a referral to [place] office for stomach pain & diarrhea the past 3 weeks. Cb#: [phone]

## 2018-01-22 NOTE — Telephone Encounter (Signed)
Spoke with patient and advised him referral has been placed and someone should call him regarding an appt with verbal understanding. Also advised of Dr.Hagler's recommendations with verbal understanding. Patient stated the pain is worse in the morning and gets better as the day goes on. Encouraged patient to drink plenty of fluids. Advised patient to go to urgent care or ED if pain or diarrhea worsened with verbal understanding.

## 2018-01-22 NOTE — Telephone Encounter (Signed)
Please advise patient that I have placed the referral to Dr. Melony Overly.  However, if he is having diarrhea and abdominal pain he should be evaluated.  If this is been going on for 3 weeks he needs to be seen and checked out.  It will take him several weeks before he can get in with gastroenterology and he should not wait if he is having pain, diarrhea, or any other worrisome problems.  Please advise him of this.  If his pain is worsened or having any symptoms of dehydration he should go to the emergency department for evaluation.

## 2018-01-23 ENCOUNTER — Other Ambulatory Visit: Payer: Self-pay

## 2018-01-23 ENCOUNTER — Encounter (HOSPITAL_COMMUNITY): Payer: Self-pay

## 2018-01-23 ENCOUNTER — Emergency Department (HOSPITAL_COMMUNITY)
Admission: EM | Admit: 2018-01-23 | Discharge: 2018-01-23 | Disposition: A | Discharge status: Home / Self Care | Payer: Medicare HMO | Attending: Emergency Medicine | Admitting: Emergency Medicine

## 2018-01-23 ENCOUNTER — Emergency Department (HOSPITAL_COMMUNITY): Payer: Medicare HMO

## 2018-01-23 DIAGNOSIS — Z951 Presence of aortocoronary bypass graft: Secondary | ICD-10-CM | POA: Diagnosis not present

## 2018-01-23 DIAGNOSIS — Z7982 Long term (current) use of aspirin: Secondary | ICD-10-CM | POA: Insufficient documentation

## 2018-01-23 DIAGNOSIS — K5730 Diverticulosis of large intestine without perforation or abscess without bleeding: Secondary | ICD-10-CM | POA: Insufficient documentation

## 2018-01-23 DIAGNOSIS — I2510 Atherosclerotic heart disease of native coronary artery without angina pectoris: Secondary | ICD-10-CM | POA: Diagnosis not present

## 2018-01-23 DIAGNOSIS — R1084 Generalized abdominal pain: Secondary | ICD-10-CM | POA: Diagnosis not present

## 2018-01-23 DIAGNOSIS — Z96659 Presence of unspecified artificial knee joint: Secondary | ICD-10-CM | POA: Diagnosis not present

## 2018-01-23 DIAGNOSIS — F17210 Nicotine dependence, cigarettes, uncomplicated: Secondary | ICD-10-CM | POA: Insufficient documentation

## 2018-01-23 DIAGNOSIS — R195 Other fecal abnormalities: Secondary | ICD-10-CM | POA: Diagnosis not present

## 2018-01-23 DIAGNOSIS — I10 Essential (primary) hypertension: Secondary | ICD-10-CM | POA: Diagnosis not present

## 2018-01-23 DIAGNOSIS — Z79899 Other long term (current) drug therapy: Secondary | ICD-10-CM | POA: Insufficient documentation

## 2018-01-23 LAB — COMPREHENSIVE METABOLIC PANEL
ALK PHOS: 52 U/L (ref 38–126)
ALT: 10 U/L — AB (ref 17–63)
AST: 14 U/L — ABNORMAL LOW (ref 15–41)
Albumin: 3 g/dL — ABNORMAL LOW (ref 3.5–5.0)
Anion gap: 9 (ref 5–15)
BUN: 13 mg/dL (ref 6–20)
CALCIUM: 8.6 mg/dL — AB (ref 8.9–10.3)
CO2: 28 mmol/L (ref 22–32)
CREATININE: 1.17 mg/dL (ref 0.61–1.24)
Chloride: 103 mmol/L (ref 101–111)
Glucose, Bld: 101 mg/dL — ABNORMAL HIGH (ref 65–99)
Potassium: 3 mmol/L — ABNORMAL LOW (ref 3.5–5.1)
SODIUM: 140 mmol/L (ref 135–145)
Total Bilirubin: 0.6 mg/dL (ref 0.3–1.2)
Total Protein: 5.5 g/dL — ABNORMAL LOW (ref 6.5–8.1)

## 2018-01-23 LAB — CBC WITH DIFFERENTIAL/PLATELET
Basophils Absolute: 0 10*3/uL (ref 0.0–0.1)
Basophils Relative: 0 %
EOS PCT: 1 %
Eosinophils Absolute: 0.1 10*3/uL (ref 0.0–0.7)
HCT: 42.1 % (ref 39.0–52.0)
Hemoglobin: 13.9 g/dL (ref 13.0–17.0)
LYMPHS ABS: 1.9 10*3/uL (ref 0.7–4.0)
Lymphocytes Relative: 19 %
MCH: 33.1 pg (ref 26.0–34.0)
MCHC: 33 g/dL (ref 30.0–36.0)
MCV: 100.2 fL — AB (ref 78.0–100.0)
MONO ABS: 0.7 10*3/uL (ref 0.1–1.0)
Monocytes Relative: 7 %
Neutro Abs: 7.3 10*3/uL (ref 1.7–7.7)
Neutrophils Relative %: 73 %
PLATELETS: 135 10*3/uL — AB (ref 150–400)
RBC: 4.2 MIL/uL — AB (ref 4.22–5.81)
RDW: 14.6 % (ref 11.5–15.5)
WBC: 10 10*3/uL (ref 4.0–10.5)

## 2018-01-23 LAB — POC OCCULT BLOOD, ED: Fecal Occult Bld: POSITIVE — AB

## 2018-01-23 LAB — PROTIME-INR
INR: 1.06
PROTHROMBIN TIME: 13.8 s (ref 11.4–15.2)

## 2018-01-23 LAB — URINALYSIS, ROUTINE W REFLEX MICROSCOPIC
BACTERIA UA: NONE SEEN
Bilirubin Urine: NEGATIVE
Glucose, UA: NEGATIVE mg/dL
KETONES UR: NEGATIVE mg/dL
Leukocytes, UA: NEGATIVE
Nitrite: NEGATIVE
PROTEIN: NEGATIVE mg/dL
Specific Gravity, Urine: >1.046 — ABNORMAL HIGH (ref 1.005–1.030)
pH: 7 (ref 5.0–8.0)

## 2018-01-23 LAB — LIPASE, BLOOD: Lipase: 20 U/L (ref 11–51)

## 2018-01-23 MED ORDER — POTASSIUM CHLORIDE CRYS ER 20 MEQ PO TBCR
40.0000 meq | EXTENDED_RELEASE_TABLET | Freq: Once | ORAL | Status: AC
  Administered 2018-01-23: 40 meq via ORAL
  Filled 2018-01-23: qty 2

## 2018-01-23 MED ORDER — IOPAMIDOL (ISOVUE-300) INJECTION 61%
100.0000 mL | Freq: Once | INTRAVENOUS | Status: AC | PRN
  Administered 2018-01-23: 100 mL via INTRAVENOUS

## 2018-01-23 NOTE — ED Triage Notes (Signed)
Pt is complaining of stomach pain and diarrhea for the last week. Pt called PCP to get referral to GI doctor, but that will take 2-3 weeks so referred to ED to get evaluation. Lower abdominal pain. Pt has had diarrhea 4 times in the last 24 hours.

## 2018-01-23 NOTE — ED Provider Notes (Signed)
[facility] EMERGENCY DEPARTMENT Provider Note   CSN: [id number] Arrival date & time: 01/23/18  5597     History   Chief Complaint Chief Complaint  Patient presents with  . Abdominal Pain    HPI Jack [name] is a 72 y.o. male.  HPI  Pt was seen at 0830.  Per pt, c/o gradual onset and persistence of constant generalized abd "pain" for the past 1 week.  Has been associated with multiple intermittent episodes of diarrhea.  Describes the abd pain as "cramping." Describes the stools as "watery." Denies N/V, no fevers, no back pain, no rash, no CP/SOB, no black or blood in stools.      Past Medical History:  Diagnosis Date  . Coronary atherosclerosis of native coronary artery    Multivessel status post CABG  . HLD (hyperlipidemia)   . Hypertension   . Tobacco user     Patient Active Problem List   Diagnosis Date Noted  . Osteoarthritis of both shoulders 06/09/2017  . Lumbar spondylosis 06/09/2017  . Lumbar degenerative disc disease 06/09/2017  . Chronic pain of both shoulders 06/09/2017  . Chronic prescription benzodiazepine use 06/09/2017  . Chronic pain syndrome 06/09/2017  . Hypertension   . Tobacco abuse 03/08/2010  . Essential hypertension, benign 07/11/2009  . Hyperlipidemia LDL goal <70 06/02/2009  . Coronary atherosclerosis of native coronary artery 06/02/2009    Past Surgical History:  Procedure Laterality Date  . APPENDECTOMY     age 23  . CHOLECYSTECTOMY  2003  . CORONARY ARTERY BYPASS GRAFT  2008   LIMA to LAD, SVG to ramus, SVG to PDA and PLA  . FOOT SURGERY    . KNEE ARTHROPLASTY    . ROTATOR CUFF REPAIR    . SHOULDER SURGERY          Home Medications    Prior to Admission medications   Medication Sig Start Date End Date Taking? Authorizing Provider  amLODipine (NORVASC) 5 MG tablet Take 1 tablet (5 mg total) by mouth every other day. 08/05/17   Caren Macadam, MD  aspirin EC 81 MG tablet Take 1 tablet (81 mg total) by mouth daily. 08/07/17    Satira Sark, MD  Cholecalciferol (VITAMIN D3) 5000 units CAPS Take 1 capsule by mouth every other day.     [provider]  citalopram (CELEXA) 40 MG tablet Take 1 tablet (40 mg total) by mouth daily. 10/21/17   Caren Macadam, MD  docusate sodium (COLACE) 100 MG capsule Take 1 capsule (100 mg total) by mouth 2 (two) times daily. 08/26/17   Caren Macadam, MD  oxyCODONE-acetaminophen (PERCOCET) 10-325 MG tablet Take 1 tablet by mouth every 8 (eight) hours as needed for pain. 11/10/17   [name], MD  rosuvastatin (CRESTOR) 20 MG tablet Take 1 tablet (20 mg total) by mouth at bedtime. 08/05/17   Caren Macadam, MD    Family History Family History  Problem Relation Age of Onset  . Heart attack Mother   . Cerebral aneurysm Father   . Coronary artery disease Neg Hx   . Diabetes Neg Hx   . Hypertension Neg Hx     Social History Social History   Tobacco Use  . Smoking status: Current Every Day Smoker    Packs/day: 1.00    Years: 57.00    Pack years: 57.00    Types: Cigarettes  . Smokeless tobacco: Never Used  Substance Use Topics  . Alcohol use: Yes    Alcohol/week: 0.0 oz  .  Drug use: No     Allergies   Patient has no known allergies.   Review of Systems Review of Systems ROS: Statement: All systems negative except as marked or noted in the HPI; Constitutional: Negative for fever and chills. ; ; Eyes: Negative for eye pain, redness and discharge. ; ; ENMT: Negative for ear pain, hoarseness, nasal congestion, sinus pressure and sore throat. ; ; Cardiovascular: Negative for chest pain, palpitations, diaphoresis, dyspnea and peripheral edema. ; ; Respiratory: Negative for cough, wheezing and stridor. ; ; Gastrointestinal: +diarrhea, abd pain. Negative for nausea, vomiting, blood in stool, hematemesis, jaundice and rectal bleeding. . ; ; Genitourinary: Negative for dysuria, flank pain and hematuria. ; ; Musculoskeletal: Negative for back pain and neck pain. Negative  for swelling and trauma.; ; Skin: Negative for pruritus, rash, abrasions, blisters, bruising and skin lesion.; ; Neuro: Negative for headache, lightheadedness and neck stiffness. Negative for weakness, altered level of consciousness, altered mental status, extremity weakness, paresthesias, involuntary movement, seizure and syncope.       Physical Exam Updated Vital Signs BP (!) 128/102 (BP Location: Right Arm)   Pulse 88   Resp 14   Ht 5\' 10"  (1.778 m)   Wt 101.6 kg (224 lb)   SpO2 98%   BMI 32.14 kg/m    BP (!) 149/74 (BP Location: Right Arm)   Pulse (!) 48   Temp 97.8 F (36.6 C) (Oral)   Resp 18   Ht 5\' 10"  (1.778 m)   Wt 101.6 kg (224 lb)   SpO2 97%   BMI 32.14 kg/m     11:18 Orthostatic Vital Signs CW  Orthostatic Lying   BP- Lying: 160/73   Pulse- Lying: 46Abnormal        Orthostatic Sitting  BP- Sitting: 150/71   Pulse- Sitting: 52       Orthostatic Standing at 0 minutes  BP- Standing at 0 minutes: 121/71   Pulse- Standing at 0 minutes: 68       Orthostatic Standing at 3 minutes  BP- Standing at 3 minutes: 121/78   Pulse- Standing at 3 minutes: 73      Physical Exam 0835: Physical examination:  Nursing notes reviewed; Vital signs and O2 SAT reviewed;  Constitutional: Well developed, Well nourished, Well hydrated, In no acute distress; Head:  Normocephalic, atraumatic; Eyes: EOMI, PERRL, No scleral icterus; ENMT: Mouth and pharynx normal, Mucous membranes moist; Neck: Supple, Full range of motion, No lymphadenopathy; Cardiovascular: Regular rate and rhythm, No gallop; Respiratory: Breath sounds clear & equal bilaterally, No wheezes.  Speaking full sentences with ease, Normal respiratory effort/excursion; Chest: Nontender, Movement normal; Abdomen: Soft, +diffuse tenderness to palp. No rebound or guarding. Nondistended, Normal bowel sounds. Rectal exam performed w/permission of pt and ED RN chaperone present.  Anal tone normal.  Non-tender, soft brown stool  in rectal vault, heme positive.  No fissures, no external hemorrhoids, no palp masses.;; Genitourinary: No CVA tenderness; Extremities: Peripheral pulses normal, No tenderness, No edema, No calf edema or asymmetry.; Neuro: AA&Ox3, Major CN grossly intact.  Speech clear. No gross focal motor or sensory deficits in extremities.; Skin: Color normal, Warm, Dry.   ED Treatments / Results  Labs (all labs ordered are listed, but only abnormal results are displayed)   EKG None  Radiology   Procedures Procedures (including critical care time)  Medications Ordered in ED Medications  iopamidol (ISOVUE-300) 61 % injection 100 mL (100 mLs Intravenous Contrast Given 01/23/18 0952)     Initial Impression /  Assessment and Plan / ED Course  I have reviewed the triage vital signs and the nursing notes.  Pertinent labs & imaging results that were available during my care of the patient were reviewed by me and considered in my medical decision making (see chart for details).  MDM Reviewed: previous chart, nursing note and vitals Reviewed previous: labs Interpretation: labs and CT scan    Results for orders placed or performed during the hospital encounter of 01/23/18  Comprehensive metabolic panel  Result Value Ref Range   Sodium 140 135 - 145 mmol/L   Potassium 3.0 (L) 3.5 - 5.1 mmol/L   Chloride 103 101 - 111 mmol/L   CO2 28 22 - 32 mmol/L   Glucose, Bld 101 (H) 65 - 99 mg/dL   BUN 13 6 - 20 mg/dL   Creatinine, Ser 1.17 0.61 - 1.24 mg/dL   Calcium 8.6 (L) 8.9 - 10.3 mg/dL   Total Protein 5.5 (L) 6.5 - 8.1 g/dL   Albumin 3.0 (L) 3.5 - 5.0 g/dL   AST 14 (L) 15 - 41 U/L   ALT 10 (L) 17 - 63 U/L   Alkaline Phosphatase 52 38 - 126 U/L   Total Bilirubin 0.6 0.3 - 1.2 mg/dL   GFR calc non Af Amer >60 >60 mL/min   GFR calc Af Amer >60 >60 mL/min   Anion gap 9 5 - 15  Lipase, blood  Result Value Ref Range   Lipase 20 11 - 51 U/L  CBC with Differential  Result Value Ref Range   WBC 10.0  4.0 - 10.5 K/uL   RBC 4.20 (L) 4.22 - 5.81 MIL/uL   Hemoglobin 13.9 13.0 - 17.0 g/dL   HCT 42.1 39.0 - 52.0 %   MCV 100.2 (H) 78.0 - 100.0 fL   MCH 33.1 26.0 - 34.0 pg   MCHC 33.0 30.0 - 36.0 g/dL   RDW 14.6 11.5 - 15.5 %   Platelets 135 (L) 150 - 400 K/uL   Neutrophils Relative % 73 %   Neutro Abs 7.3 1.7 - 7.7 K/uL   Lymphocytes Relative 19 %   Lymphs Abs 1.9 0.7 - 4.0 K/uL   Monocytes Relative 7 %   Monocytes Absolute 0.7 0.1 - 1.0 K/uL   Eosinophils Relative 1 %   Eosinophils Absolute 0.1 0.0 - 0.7 K/uL   Basophils Relative 0 %   Basophils Absolute 0.0 0.0 - 0.1 K/uL  Protime-INR  Result Value Ref Range   Prothrombin Time 13.8 11.4 - 15.2 seconds   INR 1.06   Urinalysis, Routine w reflex microscopic  Result Value Ref Range   Color, Urine YELLOW YELLOW   APPearance CLEAR CLEAR   Specific Gravity, Urine >1.046 (H) 1.005 - 1.030   pH 7.0 5.0 - 8.0   Glucose, UA NEGATIVE NEGATIVE mg/dL   Hgb urine dipstick SMALL (A) NEGATIVE   Bilirubin Urine NEGATIVE NEGATIVE   Ketones, ur NEGATIVE NEGATIVE mg/dL   Protein, ur NEGATIVE NEGATIVE mg/dL   Nitrite NEGATIVE NEGATIVE   Leukocytes, UA NEGATIVE NEGATIVE   RBC / HPF 11-20 0 - 5 RBC/hpf   WBC, UA 0-5 0 - 5 WBC/hpf   Bacteria, UA NONE SEEN NONE SEEN   Squamous Epithelial / LPF 0-5 0 - 5   Mucus PRESENT   POC occult blood, ED  Result Value Ref Range   Fecal Occult Bld POSITIVE (A) NEGATIVE   Ct Abdomen Pelvis W Contrast Result Date: 01/23/2018 CLINICAL DATA:  Stomach pain and diarrhea  for the last week. Lower abdominal pain. History of coronary artery disease, hypertension, appendectomy, cholecystectomy, CABG. EXAM: CT ABDOMEN AND PELVIS WITH CONTRAST TECHNIQUE: Multidetector CT imaging of the abdomen and pelvis was performed using the standard protocol following bolus administration of intravenous contrast. CONTRAST:  142mL ISOVUE-300 IOPAMIDOL (ISOVUE-300) INJECTION 61% COMPARISON:  CT abdomen dated 03/29/2010. FINDINGS: Lower  chest: No acute abnormality. Hepatobiliary: No focal liver abnormality is seen. Status post cholecystectomy. No biliary dilatation. Pancreas: Unremarkable. No pancreatic ductal dilatation or surrounding inflammatory changes. Spleen: Normal in size without focal abnormality. Adrenals/Urinary Tract: Adrenal glands appear normal. 11 mm nonobstructing LEFT renal stone. No suspicious mass or hydronephrosis bilaterally. No perinephric fluid. No ureteral or bladder calculi identified. Bladder appears unremarkable, partially decompressed. Stomach/Bowel: Bowel is normal in caliber. Extensive diverticulosis throughout the lower descending colon and upper sigmoid colon. No pericolonic fluid or inflammation to suggest acute diverticulitis. Thickening of the walls of the sigmoid and descending colon is likely chronic and related to the underlying diverticulosis. Stomach is unremarkable, partially decompressed. No small bowel wall thickening or evidence of acute bowel wall inflammation. Vascular/Lymphatic: Extensive aortic atherosclerosis. 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 No acute appearing vascular abnormality. Reproductive: Prostate is unremarkable. Other: No free fluid or abscess collection. No free intraperitoneal air. Musculoskeletal: Advanced degenerative changes throughout the slightly scoliotic thoracolumbar spine. No acute or suspicious osseous finding. Superficial soft tissues are unremarkable. IMPRESSION: 1. No acute findings. No bowel obstruction or evidence of acute bowel wall inflammation. No free fluid or abscess collection. No free intraperitoneal air. No evidence of acute solid organ abnormality. 2. Colonic diverticulosis without evidence of acute diverticulitis. 3. Aortic atherosclerosis. 4. 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 Recommend followup by ultrasound in 3 years. This recommendation follows ACR consensus guidelines: White  Paper of the ACR Incidental Findings Committee II on Vascular Findings. J Am Coll Radiol 2013; 38:182-993 5. 11 mm nonobstructing LEFT renal stone. Electronically Signed   By: Franki Cabot M.D.   On: 01/23/2018 10:28    1225:  Potassium repleted PO. Workup otherwise reassuring.  Pt has tol PO well while in the ED without N/V.  No stooling while in the ED.  Abd benign, VSS. Feels better and wants to go home now. Epic chart reviewed: baseline Hgb around 15. Stool occult blood and repeat H/H will need f/u by PMD and GI MD. Pt is not on anticoagulants. Pt is not orthostatic on VS. No clear indication for admission at this time.  Dx and testing d/w pt and family.  Questions answered.  Verb understanding, agreeable to d/c home with outpt f/u.    Final Clinical Impressions(s) / ED Diagnoses   Final diagnoses:  None    ED Discharge Orders    None       Francine Graven, DO 01/27/18 7169

## 2018-01-23 NOTE — Discharge Instructions (Addendum)
Take your usual prescriptions as previously directed.  Your stool was positive for occult blood. Your regular medical doctor will need to recheck your hemoglobin level in the next week. 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 Recommend followup by ultrasound in 3 years. This recommendation follows ACR consensus guidelines: White Paper of the ACR Incidental Findings Committee II on Vascular Findings. J Am Coll Radiol [phone]. "  Call your regular medical doctor on Monday to schedule a follow up appointment within the next 3 days. Call the GI doctor on Monday to schedule a follow up appointment within the next week.  Call the Vascular Surgeon on Monday to schedule a follow up appointment within the next few weeks.  Return to the Emergency Department immediately sooner if worsening.

## 2018-01-24 LAB — URINE CULTURE: Culture: <10000 — AB

## 2018-01-28 ENCOUNTER — Encounter: Payer: Self-pay | Admitting: Family Medicine

## 2018-01-29 ENCOUNTER — Encounter: Payer: Self-pay | Admitting: Family Medicine

## 2018-02-02 ENCOUNTER — Other Ambulatory Visit: Payer: Self-pay

## 2018-02-02 ENCOUNTER — Ambulatory Visit (INDEPENDENT_AMBULATORY_CARE_PROVIDER_SITE_OTHER): Payer: Medicare HMO | Admitting: Family Medicine

## 2018-02-02 ENCOUNTER — Encounter: Payer: Self-pay | Admitting: Family Medicine

## 2018-02-02 VITALS — BP 120/70 | HR 71 | Temp 98.4°F | Resp 16 | Ht 70.0 in | Wt 207.0 lb

## 2018-02-02 DIAGNOSIS — R1084 Generalized abdominal pain: Secondary | ICD-10-CM

## 2018-02-02 DIAGNOSIS — R39198 Other difficulties with micturition: Secondary | ICD-10-CM

## 2018-02-02 DIAGNOSIS — R634 Abnormal weight loss: Secondary | ICD-10-CM

## 2018-02-02 DIAGNOSIS — N200 Calculus of kidney: Secondary | ICD-10-CM | POA: Diagnosis not present

## 2018-02-02 NOTE — Progress Notes (Signed)
Patient ID: Jack [name], male    DOB: [birth year], 72 y.o.   MRN: [id number]  Chief Complaint  Patient presents with  . ER Hospital follow up    DX: Generalized Abdominal Pain. Appt with gastroenterology tomorrow    Allergies Patient has no known allergies.  Subjective:   Jack [name] is a 72 y.o. male who presents to [facility] today.  HPI Here for follow up of abdominal pain. Reports that was seen in the ED for the pain. Still has pain in lower abdomen. Still having loose stools. Is still having loose stools, but only once a week. Reports that when he goes to the bathroom he has to strain and then when he finally does have a bowel movement that it really comes out. No melena. No rectal bleeding. Reports it is a dull ache and pain in belly that can be in the lower part of abdomen or around his belly button. No fevers at home. Feels tired and worn out. Just wants to sleep. Has been going on for about a month. Reports that when goes to eat stomach hurts and so has not been eating. Lost 17 pounds since last visit.   Can eat until stomach starts hurting and then quits.  For over the past month has not been to urinate well. No nausea or vomiting. Energy low. Feel tired. Reports that does have to strain to urinate and has been having to sit on the toilet to urinate so that he can strain.   The labs and CT scan from ED are reviewed today. Patient had RBC in urine but no evidence of infection. No acute issues on CT scan of abdomen.   He reports that he does not feel depressed but just tired and in pain. Grandson reports that he has not been eating much.  Still smokes.    Past Medical History:  Diagnosis Date  . Coronary atherosclerosis of native coronary artery    Multivessel status post CABG  . HLD (hyperlipidemia)   . Hypertension   . Tobacco user     Past Surgical History:  Procedure Laterality Date  . APPENDECTOMY     age 35  . CHOLECYSTECTOMY  2003  .  CORONARY ARTERY BYPASS GRAFT  2008   LIMA to LAD, SVG to ramus, SVG to PDA and PLA  . FOOT SURGERY    . KNEE ARTHROPLASTY    . ROTATOR CUFF REPAIR    . SHOULDER SURGERY      Family History  Problem Relation Age of Onset  . Heart attack Mother   . Cerebral aneurysm Father   . Coronary artery disease Neg Hx   . Diabetes Neg Hx   . Hypertension Neg Hx      Social History   Socioeconomic History  . Marital status: Widowed    Spouse name: Not on file  . Number of children: Not on file  . Years of education: Not on file  . Highest education level: Not on file  Occupational History  . Occupation: retired  Scientific laboratory technician  . Financial resource strain: Not on file  . Food insecurity:    Worry: Not on file    Inability: Not on file  . Transportation needs:    Medical: Not on file    Non-medical: Not on file  Tobacco Use  . Smoking status: Current Every Day Smoker    Packs/day: 1.00    Years: 57.00    Pack years: 57.00  Types: Cigarettes  . Smokeless tobacco: Never Used  Substance and Sexual Activity  . Alcohol use: Yes    Alcohol/week: 0.0 oz  . Drug use: No  . Sexual activity: Never  Lifestyle  . Physical activity:    Days per week: Not on file    Minutes per session: Not on file  . Stress: Not on file  Relationships  . Social connections:    Talks on phone: Not on file    Gets together: Not on file    Attends religious service: Not on file    Active member of club or organization: Not on file    Attends meetings of clubs or organizations: Not on file    Relationship status: Not on file  Other Topics Concern  . Not on file  Social History Narrative   Had been married for over 69 years and wife passed away in 12-03-15 from cancer. Has children and grandchildren and great grandchildren. Gets together with family for dinner frequently. Lives alone and cooks for self. Live in [place]. 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 Eats all food groups.     Current Outpatient Medications on File Prior to Visit  Medication Sig Dispense Refill  . amLODipine (NORVASC) 5 MG tablet Take 1 tablet (5 mg total) by mouth every other day. 90 tablet 1  . aspirin EC 81 MG tablet Take 1 tablet (81 mg total) by mouth daily. 90 tablet 3  . Cholecalciferol (VITAMIN D3) 1000 units CAPS Take 1,000 Units by mouth daily.    . citalopram (CELEXA) 40 MG tablet Take 1 tablet (40 mg total) by mouth daily. 90 tablet 1  . oxyCODONE-acetaminophen (PERCOCET) 10-325 MG tablet Take 1 tablet by mouth every 8 (eight) hours as needed for pain. 90 tablet 0  . polyethylene glycol (MIRALAX / GLYCOLAX) packet Take 17 g by mouth 2 (two) times daily.     No current facility-administered medications on file prior to visit.     Review of Systems  Constitutional: Positive for appetite change, fatigue and unexpected weight change. Negative for chills and fever.  HENT: Negative for sore throat, trouble swallowing and voice change.   Eyes: Negative for visual disturbance.  Respiratory: Negative for cough, chest tightness, shortness of breath and wheezing.   Cardiovascular: Negative for chest pain, palpitations and leg swelling.  Gastrointestinal: Positive for abdominal pain, constipation and diarrhea. Negative for blood in stool, nausea and vomiting.  Genitourinary: Positive for difficulty urinating. Negative for decreased urine volume, discharge, dysuria, flank pain, frequency, hematuria, penile pain, penile swelling, scrotal swelling, testicular pain and urgency.  Skin: Negative for rash.  Neurological: Negative for dizziness, tremors, syncope, facial asymmetry, weakness and headaches.  Hematological: Negative for adenopathy. Does not bruise/bleed easily.  Psychiatric/Behavioral: Negative for behavioral problems, dysphoric mood, self-injury, sleep disturbance and suicidal ideas.     Objective:   BP 120/70 (BP Location: Left Arm,  Patient Position: Sitting, Cuff Size: Large)   Pulse 71   Temp 98.4 F (36.9 C) (Temporal)   Resp 16   Ht 5\' 10"  (1.778 m)   Wt 207 lb 0.6 oz (93.9 kg)   SpO2 97%   BMI 29.71 kg/m   Physical Exam  Constitutional: He is oriented to person, place, and time.  HENT:  Head: Normocephalic and atraumatic.  Eyes: Pupils are equal, round, and reactive to light. Conjunctivae and EOM are normal.  Pulmonary/Chest: Effort normal and  breath sounds normal.  Abdominal: Soft. Bowel sounds are normal. There is tenderness. There is no guarding.  Obese abdomen with pannus.   Genitourinary: Rectum normal and prostate normal. Rectal exam shows no fissure, no mass, no tenderness and anal tone normal. Prostate is not enlarged and not tender.  Neurological: He is alert and oriented to person, place, and time.  Skin: Skin is warm and dry.    Depression screen [facility] 2/9 02/02/2018 11/10/2017 09/01/2017 08/26/2017 08/05/2017  Decreased Interest 1 0 0 3 3  Down, Depressed, Hopeless 0 0 0 3 3  PHQ - 2 Score 1 0 0 6 6  Altered sleeping - - - 3 3  Tired, decreased energy - - - 3 2  Change in appetite - - - 3 3  Feeling bad or failure about yourself  - - - 0 1  Trouble concentrating - - - 0 2  Moving slowly or fidgety/restless - - - 0 1  Suicidal thoughts - - - 0 1  PHQ-9 Score - - - 15 19  Difficult doing work/chores - - - - Somewhat difficult    Assessment and Plan  1. Generalized abdominal pain Uncertain etiology. CT scan unremarkable. Needs colonoscopy. Rectal exam normal today. Counseled regarding worrisome s/s of abdominal pain and if develop to ED. Voiced understanding.  - CBC with Differential/Platelet - COMPLETE METABOLIC PANEL WITH GFR  2. Difficulty urinating Called [facility] and patient will be seen in the office today. Prostate WNL.  - Urinalysis, Routine w reflex microscopic - PSA, total and free - Ambulatory referral to Urology  3. Nephrolithiasis Possibly cause of RBC in urine, but needs  evaluation to rule out bladder cancer due to tobacco use/risks.  - Ambulatory referral to Urology  4. Weight loss Worrisome. Patient has had significant weight loss, presenting with pain in abdomen, RBC in urine, and no prior colonoscopy.  Needs to be checked for colon cancer causing pain.  Keep appointment tomorrow.   Check labs today.  Return in about 2 weeks (around 02/16/2018) for follow up. Caren Macadam, MD 02/03/2018

## 2018-02-03 ENCOUNTER — Ambulatory Visit (INDEPENDENT_AMBULATORY_CARE_PROVIDER_SITE_OTHER): Payer: Medicare HMO | Admitting: Internal Medicine

## 2018-02-03 ENCOUNTER — Encounter (INDEPENDENT_AMBULATORY_CARE_PROVIDER_SITE_OTHER): Payer: Self-pay | Admitting: Internal Medicine

## 2018-02-03 ENCOUNTER — Encounter (INDEPENDENT_AMBULATORY_CARE_PROVIDER_SITE_OTHER): Payer: Self-pay | Admitting: *Deleted

## 2018-02-03 ENCOUNTER — Telehealth (INDEPENDENT_AMBULATORY_CARE_PROVIDER_SITE_OTHER): Payer: Self-pay | Admitting: *Deleted

## 2018-02-03 VITALS — BP 148/78 | HR 60 | Temp 97.3°F | Ht 71.0 in | Wt 207.0 lb

## 2018-02-03 DIAGNOSIS — R197 Diarrhea, unspecified: Secondary | ICD-10-CM | POA: Diagnosis not present

## 2018-02-03 DIAGNOSIS — R195 Other fecal abnormalities: Secondary | ICD-10-CM | POA: Insufficient documentation

## 2018-02-03 LAB — URINALYSIS, ROUTINE W REFLEX MICROSCOPIC
Bilirubin Urine: NEGATIVE
Glucose, UA: NEGATIVE
Hgb urine dipstick: NEGATIVE
KETONES UR: NEGATIVE
Leukocytes, UA: NEGATIVE
NITRITE: NEGATIVE
Protein, ur: NEGATIVE
Specific Gravity, Urine: 1.018 (ref 1.001–1.03)
pH: 6.5 (ref 5.0–8.0)

## 2018-02-03 NOTE — Progress Notes (Addendum)
   Subjective:    Patient ID: Jack [name], male    DOB: April 06, 1946, 72 y.o.   MRN: [id number]  HPI Referred by Dr. Mannie Stabile for abdominal pain.  Seen in the ED 01/23/2018  For abdominal pain.  Stool was guaiac positive.  He says he has lower abdominal pain. Has had lower abdominal pain x 3 months. When he eats, he has nausea. Trying to eat 1-3 meals a day. He has had lost weight. IN December 08/11/2017 wt today.  238. Today his weight is 207.  When he eats , he has lower abdominal pain.    He has diarrhea. Diarrhea has been going on for about 2 weeks. No recent antibiotics. States 2 weeks ago, his stools were normal.  Would have a BM x 1-2 a week. Stools are stringy.  Says he does not have an appetite.   He takes Miralax as needed. States his stool has more form now. Saw Dr. Nevada Crane at [place] and a CT has been ordered.  He has not seen any blood in his stool Denies acid reflux. He c/o tiredness.  Has never undergone a colonoscopy.   Hx of CABG in 2008   CT scan on 01/23/2018 revealed 1. No acute findings. No bowel obstruction or evidence of acute bowel wall inflammation. No free fluid or abscess collection. No free intraperitoneal air. No evidence of acute solid organ abnormality. 2. Colonic diverticulosis without evidence of acute diverticulitis. 3. Aortic atherosclerosis. 4. 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 Recommend followup by ultrasound in 3 years. This recommendation  Review of Systems     Objective:   Physical Exam Blood pressure (!) 148/78, pulse 60, temperature (!) 97.3 F (36.3 C), height 5\' 11"  (1.803 m), weight 207 lb (93.9 kg). Alert and oriented. Skin warm and dry. Oral mucosa is moist.   . Sclera anicteric, conjunctivae is pink. Thyroid not enlarged. No cervical lymphadenopathy. Lungs clear. Heart regular rate and rhythm.  Abdomen is soft. Bowel sounds are positive. No hepatomegaly. No abdominal masses felt. No tenderness.  No edema to lower  extremities.           Assessment & Plan:  Guaiac positive stool. Colonic neoplasm  needs to be ruled out.  Will also get a GI pathogen

## 2018-02-03 NOTE — Patient Instructions (Signed)
GI pathogen, colonoscopy

## 2018-02-03 NOTE — Telephone Encounter (Signed)
Patient needs trilyte 

## 2018-02-04 MED ORDER — PEG 3350-KCL-NA BICARB-NACL 420 G PO SOLR: 4000.0000 mL | Freq: Once | ORAL | 0 refills | Status: AC

## 2018-02-11 ENCOUNTER — Ambulatory Visit
Payer: Medicare HMO | Attending: Student in an Organized Health Care Education/Training Program | Admitting: Student in an Organized Health Care Education/Training Program

## 2018-02-11 ENCOUNTER — Other Ambulatory Visit: Payer: Self-pay

## 2018-02-11 ENCOUNTER — Encounter: Payer: Self-pay | Admitting: Student in an Organized Health Care Education/Training Program

## 2018-02-11 VITALS — BP 115/78 | HR 70 | Temp 98.0°F | Resp 18 | Ht 70.0 in | Wt 207.0 lb

## 2018-02-11 DIAGNOSIS — Z7982 Long term (current) use of aspirin: Secondary | ICD-10-CM | POA: Insufficient documentation

## 2018-02-11 DIAGNOSIS — I10 Essential (primary) hypertension: Secondary | ICD-10-CM | POA: Insufficient documentation

## 2018-02-11 DIAGNOSIS — F1390 Sedative, hypnotic, or anxiolytic use, unspecified, uncomplicated: Secondary | ICD-10-CM | POA: Diagnosis not present

## 2018-02-11 DIAGNOSIS — G894 Chronic pain syndrome: Secondary | ICD-10-CM

## 2018-02-11 DIAGNOSIS — Z9049 Acquired absence of other specified parts of digestive tract: Secondary | ICD-10-CM | POA: Insufficient documentation

## 2018-02-11 DIAGNOSIS — M25562 Pain in left knee: Secondary | ICD-10-CM | POA: Diagnosis present

## 2018-02-11 DIAGNOSIS — K5730 Diverticulosis of large intestine without perforation or abscess without bleeding: Secondary | ICD-10-CM | POA: Diagnosis not present

## 2018-02-11 DIAGNOSIS — M25512 Pain in left shoulder: Secondary | ICD-10-CM

## 2018-02-11 DIAGNOSIS — M170 Bilateral primary osteoarthritis of knee: Secondary | ICD-10-CM | POA: Diagnosis not present

## 2018-02-11 DIAGNOSIS — Z720 Tobacco use: Secondary | ICD-10-CM | POA: Diagnosis not present

## 2018-02-11 DIAGNOSIS — M19011 Primary osteoarthritis, right shoulder: Secondary | ICD-10-CM

## 2018-02-11 DIAGNOSIS — M25561 Pain in right knee: Secondary | ICD-10-CM | POA: Insufficient documentation

## 2018-02-11 DIAGNOSIS — Z951 Presence of aortocoronary bypass graft: Secondary | ICD-10-CM | POA: Diagnosis not present

## 2018-02-11 DIAGNOSIS — I2510 Atherosclerotic heart disease of native coronary artery without angina pectoris: Secondary | ICD-10-CM | POA: Insufficient documentation

## 2018-02-11 DIAGNOSIS — I700 Atherosclerosis of aorta: Secondary | ICD-10-CM | POA: Diagnosis not present

## 2018-02-11 DIAGNOSIS — M25511 Pain in right shoulder: Secondary | ICD-10-CM

## 2018-02-11 DIAGNOSIS — M19012 Primary osteoarthritis, left shoulder: Secondary | ICD-10-CM

## 2018-02-11 DIAGNOSIS — E785 Hyperlipidemia, unspecified: Secondary | ICD-10-CM | POA: Insufficient documentation

## 2018-02-11 DIAGNOSIS — G8929 Other chronic pain: Secondary | ICD-10-CM | POA: Diagnosis not present

## 2018-02-11 DIAGNOSIS — M5136 Other intervertebral disc degeneration, lumbar region: Secondary | ICD-10-CM

## 2018-02-11 DIAGNOSIS — M47816 Spondylosis without myelopathy or radiculopathy, lumbar region: Secondary | ICD-10-CM | POA: Diagnosis not present

## 2018-02-11 DIAGNOSIS — Z79899 Other long term (current) drug therapy: Secondary | ICD-10-CM

## 2018-02-11 DIAGNOSIS — M51369 Other intervertebral disc degeneration, lumbar region without mention of lumbar back pain or lower extremity pain: Secondary | ICD-10-CM

## 2018-02-11 LAB — GASTROINTESTINAL PATHOGEN PANEL PCR
C. difficile Tox A/B, PCR: DETECTED — AB
CAMPYLOBACTER, PCR: NOT DETECTED
Cryptosporidium, PCR: NOT DETECTED
E COLI 0157, PCR: NOT DETECTED
E coli (ETEC) LT/ST PCR: NOT DETECTED
E coli (STEC) stx1/stx2, PCR: NOT DETECTED
Giardia lamblia, PCR: NOT DETECTED
Norovirus, PCR: NOT DETECTED
Rotavirus A, PCR: NOT DETECTED
Salmonella, PCR: NOT DETECTED
Shigella, PCR: NOT DETECTED

## 2018-02-11 MED ORDER — OXYCODONE-ACETAMINOPHEN 10-325 MG PO TABS: 1.0000 | ORAL_TABLET | Freq: Three times a day (TID) | ORAL | 0 refills | Status: DC | PRN

## 2018-02-11 NOTE — Progress Notes (Signed)
Patient's Name: Jack [name]  MRN: [id number]  Referring Provider: Caren Macadam, MD  DOB: November 16, 1945  PCP: Caren Macadam, MD  DOS: 02/11/2018  Note by: [name], MD  Service setting: Ambulatory outpatient  Specialty: Interventional Pain Management  Location: [facility] (AMB) Pain Management Facility    Patient type: Established   Primary Reason(s) for Visit: Encounter for prescription drug management. (Level of risk: moderate)  CC: Knee Pain (bilateral) and Shoulder Pain (bilateral)  HPI  [name] is a 72 y.o. year old, male patient, who comes today for a medication management evaluation. He has Hyperlipidemia LDL goal <70; Tobacco abuse; Essential hypertension, benign; Coronary atherosclerosis of native coronary artery; Hypertension; Osteoarthritis of both shoulders; Lumbar spondylosis; Lumbar degenerative disc disease; Chronic pain of both shoulders; Chronic prescription benzodiazepine use; Chronic pain syndrome; Diarrhea; and Guaiac positive stools on their problem list. His primarily concern today is the Knee Pain (bilateral) and Shoulder Pain (bilateral)  Pain Assessment: Location: Right, Left Knee Radiating: denies Onset: More than a month ago Duration: Chronic pain Quality: Aching, Dull Severity: 0-No pain/10 (subjective, self-reported pain score)  Note: Reported level is compatible with observation.                         When using our objective Pain Scale, levels between 6 and 10/10 are said to belong in an emergency room, as it progressively worsens from a 6/10, described as severely limiting, requiring emergency care not usually available at an outpatient pain management facility. At a 6/10 level, communication becomes difficult and requires great effort. Assistance to reach the emergency department may be required. Facial flushing and profuse sweating along with potentially dangerous increases in heart rate and blood pressure will be evident. Effect on ADL:   Timing:  Intermittent Modifying factors: medications BP: 115/78  HR: 70  Jack [name] was last scheduled for an appointment on 11/10/2017 for medication management. During today's appointment we reviewed Jack [name]'s chronic pain status, as well as his outpatient medication regimen.  The patient  reports that he does not use drugs. His body mass index is 29.7 kg/m.  Further details on both, my assessment(s), as well as the proposed treatment plan, please see below.  Controlled Substance Pharmacotherapy Assessment REMS (Risk Evaluation and Mitigation Strategy)  Analgesic: Percocet 10 mg 3 times daily as needed, quantity 90 MME/day: 45 mg/day.  Landis Martins, RN  02/11/2018  2:00 PM  Sign at close encounter Nursing Pain Medication Assessment:  Safety precautions to be maintained throughout the outpatient stay will include: orient to surroundings, keep bed in low position, maintain call bell within reach at all times, provide assistance with transfer out of bed and ambulation.  Medication Inspection Compliance: Pill count conducted under aseptic conditions, in front of the patient. Neither the pills nor the bottle was removed from the patient's sight at any time. Once count was completed pills were immediately returned to the patient in their original bottle.  Medication: Oxycodone/APAP Pill/Patch Count: 50 of 90 pills remain Pill/Patch Appearance: Markings consistent with prescribed medication Bottle Appearance: Standard pharmacy container. Clearly labeled. Filled Date: 06/07 / 2019 Last Medication intake:  Today   Pharmacokinetics: Liberation and absorption (onset of action): WNL Distribution (time to peak effect): WNL Metabolism and excretion (duration of action): WNL         Pharmacodynamics: Desired effects: Analgesia: Jack [name] reports >50% benefit. Functional ability: Patient reports that medication allows him to accomplish basic ADLs Clinically meaningful improvement  in function  (CMIF): Sustained CMIF goals met Perceived effectiveness: Described as relatively effective, allowing for increase in activities of daily living (ADL) Undesirable effects: Side-effects or Adverse reactions: None reported Monitoring: [place] PMP: Online review of the past 17-monthperiod conducted. Compliant with practice rules and regulations Last UDS on record: Summary  Date Value Ref Range Status  06/09/2017 FINAL  Final    Comment:    ==================================================================== TOXASSURE COMP DRUG ANALYSIS,UR ==================================================================== Test                             Result       Flag       Units Drug Present and Declared for Prescription Verification   Trazodone                      PRESENT      EXPECTED   1,3 chlorophenyl piperazine    PRESENT      EXPECTED    1,3-chlorophenyl piperazine is an expected metabolite of    trazodone.   Acetaminophen                  PRESENT      EXPECTED   Salicylate                     PRESENT      EXPECTED Drug Present not Declared for Prescription Verification   Alprazolam                     18           UNEXPECTED ng/mg creat   Alpha-hydroxyalprazolam        32           UNEXPECTED ng/mg creat    Source of alprazolam is a scheduled prescription medication.    Alpha-hydroxyalprazolam is an expected metabolite of alprazolam.   Hydrocodone                    457          UNEXPECTED ng/mg creat   Dihydrocodeine                 250          UNEXPECTED ng/mg creat   Norhydrocodone                 659          UNEXPECTED ng/mg creat    Sources of hydrocodone include scheduled prescription    medications. Dihydrocodeine and norhydrocodone are expected    metabolites of hydrocodone. Dihydrocodeine is also available as a    scheduled prescription medication.   Venlafaxine                    PRESENT      UNEXPECTED   Desmethylvenlafaxine           PRESENT      UNEXPECTED     Desmethylvenlafaxine is an expected metabolite of venlafaxine. Drug Absent but Declared for Prescription Verification   Oxycodone                      Not Detected UNEXPECTED ng/mg creat   Tizanidine                     Not Detected UNEXPECTED    Tizanidine, as indicated in the declared  medication list, is not    always detected even when used as directed. ==================================================================== Test                      Result    Flag   Units      Ref Range   Creatinine              180              mg/dL      >=20 ==================================================================== Declared Medications:  The flagging and interpretation on this report are based on the  following declared medications.  Unexpected results may arise from  inaccuracies in the declared medications.  **Note: The testing scope of this panel includes these medications:  Oxycodone (Percocet)  Trazodone  **Note: The testing scope of this panel does not include small to  moderate amounts of these reported medications:  Acetaminophen (Percocet)  Aspirin  Tizanidine  **Note: The testing scope of this panel does not include following  reported medications:  Amlodipine (Norvasc)  Carvedilol (Coreg)  Rosuvastatin (Crestor)  Vitamin D3 ==================================================================== For clinical consultation, please call [phone]. ====================================================================    UDS interpretation: Compliant          Medication Assessment Form: Reviewed. Patient indicates being compliant with therapy Treatment compliance: Compliant Risk Assessment Profile: Aberrant behavior: See prior evaluations. None observed or detected today Comorbid factors increasing risk of overdose: See prior notes. No additional risks detected today Risk of substance use disorder (SUD): Low  ORT Scoring interpretation table:  Score <3 = Low Risk for SUD   Score between 4-7 = Moderate Risk for SUD  Score >8 = High Risk for Opioid Abuse   Risk Mitigation Strategies:  Patient Counseling: Covered Patient-Prescriber Agreement (PPA): Present and active  Notification to other healthcare providers: Done  Pharmacologic Plan: No change in therapy, at this time.             Laboratory Chemistry  Inflammation Markers (CRP: Acute Phase) (ESR: Chronic Phase) No results found for: CRP, ESRSEDRATE, LATICACIDVEN                       Rheumatology Markers No results found for: RF, ANA, LABURIC, URICUR, LYMEIGGIGMAB, LYMEABIGMQN, HLAB27                      Renal Function Markers Lab Results  Component Value Date   BUN 13 01/23/2018   CREATININE 1.17 38/32/9191   BCR NOT APPLICABLE 66/01/44   GFRAA >60 01/23/2018   GFRNONAA >60 01/23/2018                             Hepatic Function Markers Lab Results  Component Value Date   AST 14 (L) 01/23/2018   ALT 10 (L) 01/23/2018   ALBUMIN 3.0 (L) 01/23/2018   ALKPHOS 52 01/23/2018   LIPASE 20 01/23/2018                        Electrolytes Lab Results  Component Value Date   NA 140 01/23/2018   K 3.0 (L) 01/23/2018   CL 103 01/23/2018   CALCIUM 8.6 (L) 01/23/2018                        Neuropathy Markers Lab Results  Component Value Date   HGBA1C 5.2 05/06/2017  Bone Pathology Markers Lab Results  Component Value Date   VD25OH 47 05/06/2017                         Coagulation Parameters Lab Results  Component Value Date   INR 1.06 01/23/2018   LABPROT 13.8 01/23/2018   PLT 135 (L) 01/23/2018                        Cardiovascular Markers Lab Results  Component Value Date   HGB 13.9 01/23/2018   HCT 42.1 01/23/2018                         CA Markers No results found for: CEA, CA125, LABCA2                      Note: Lab results reviewed.  Recent Diagnostic Imaging Results  CT Abdomen Pelvis W Contrast CLINICAL DATA:  Stomach pain and  diarrhea for the last week. Lower abdominal pain.  History of coronary artery disease, hypertension, appendectomy, cholecystectomy, CABG.  EXAM: CT ABDOMEN AND PELVIS WITH CONTRAST  TECHNIQUE: Multidetector CT imaging of the abdomen and pelvis was performed using the standard protocol following bolus administration of intravenous contrast.  CONTRAST:  139m ISOVUE-300 IOPAMIDOL (ISOVUE-300) INJECTION 61%  COMPARISON:  CT abdomen dated 03/29/2010.  FINDINGS: Lower chest: No acute abnormality.  Hepatobiliary: No focal liver abnormality is seen. Status post cholecystectomy. No biliary dilatation.  Pancreas: Unremarkable. No pancreatic ductal dilatation or surrounding inflammatory changes.  Spleen: Normal in size without focal abnormality.  Adrenals/Urinary Tract: Adrenal glands appear normal. 11 mm nonobstructing LEFT renal stone. No suspicious mass or hydronephrosis bilaterally. No perinephric fluid. No ureteral or bladder calculi identified. Bladder appears unremarkable, partially decompressed.  Stomach/Bowel: Bowel is normal in caliber. Extensive diverticulosis throughout the lower descending colon and upper sigmoid colon. No pericolonic fluid or inflammation to suggest acute diverticulitis. Thickening of the walls of the sigmoid and descending colon is likely chronic and related to the underlying diverticulosis.  Stomach is unremarkable, partially decompressed. No small bowel wall thickening or evidence of acute bowel wall inflammation.  Vascular/Lymphatic: Extensive aortic atherosclerosis. 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  No acute appearing vascular abnormality.  Reproductive: Prostate is unremarkable.  Other: No free fluid or abscess collection. No free intraperitoneal air.  Musculoskeletal: Advanced degenerative changes throughout the slightly scoliotic thoracolumbar spine. No acute or  suspicious osseous finding. Superficial soft tissues are unremarkable.  IMPRESSION: 1. No acute findings. No bowel obstruction or evidence of acute bowel wall inflammation. No free fluid or abscess collection. No free intraperitoneal air. No evidence of acute solid organ abnormality. 2. Colonic diverticulosis without evidence of acute diverticulitis. 3. Aortic atherosclerosis. 4. 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 Recommend followup by ultrasound in 3 years. This recommendation follows ACR consensus guidelines: White Paper of the ACR Incidental Findings Committee II on Vascular Findings. J Am Coll Radiol 2013; 108:144-8185. 11 mm nonobstructing LEFT renal stone.  Electronically Signed   By: SFranki CabotM.D.   On: 01/23/2018 10:28  Complexity Note: Imaging results reviewed. Results shared with Mr. ESahni using Layman's terms.                         Meds   Current Outpatient Medications:  .  amLODipine (  NORVASC) 5 MG tablet, Take 1 tablet (5 mg total) by mouth every other day., Disp: 90 tablet, Rfl: 1 .  aspirin EC 81 MG tablet, Take 1 tablet (81 mg total) by mouth daily., Disp: 90 tablet, Rfl: 3 .  Cholecalciferol (VITAMIN D3) 1000 units CAPS, Take 1,000 Units by mouth daily., Disp: , Rfl:  .  citalopram (CELEXA) 40 MG tablet, Take 1 tablet (40 mg total) by mouth daily., Disp: 90 tablet, Rfl: 1 .  oxyCODONE-acetaminophen (PERCOCET) 10-325 MG tablet, Take 1 tablet by mouth every 8 (eight) hours as needed for pain., Disp: 90 tablet, Rfl: 0 .  polyethylene glycol (MIRALAX / GLYCOLAX) packet, Take 17 g by mouth 2 (two) times daily., Disp: , Rfl:   ROS  Constitutional: Denies any fever or chills Gastrointestinal: No reported hemesis, hematochezia, vomiting, or acute GI distress Musculoskeletal: Denies any acute onset joint swelling, redness, loss of ROM, or weakness Neurological: No reported episodes of acute onset apraxia, aphasia, dysarthria, agnosia,  amnesia, paralysis, loss of coordination, or loss of consciousness  Allergies  Jack [name] has No Known Allergies.  [place]  Drug: Jack [name]  reports that he does not use drugs. Alcohol:  reports that he drinks alcohol. Tobacco:  reports that he has been smoking cigarettes.  He has a 57.00 pack-year smoking history. He has never used smokeless tobacco. Medical:  has a past medical history of Coronary atherosclerosis of native coronary artery, HLD (hyperlipidemia), Hypertension, and Tobacco user. Surgical: Jack [name]  has a past surgical history that includes Foot surgery; Appendectomy; Knee Arthroplasty; Coronary artery bypass graft (2008); Rotator cuff repair; Shoulder surgery; and Cholecystectomy (2003). Family: family history includes Cerebral aneurysm in his father; Heart attack in his mother.  Constitutional Exam  General appearance: Well nourished, well developed, and well hydrated. In no apparent acute distress Vitals:   02/11/18 1355  BP: 115/78  Pulse: 70  Resp: 18  Temp: 98 F (36.7 C)  TempSrc: Oral  SpO2: 95%  Weight: 207 lb (93.9 kg)  Height: 5' 10"  (1.778 m)   BMI Assessment: Estimated body mass index is 29.7 kg/m as calculated from the following:   Height as of this encounter: 5' 10"  (1.778 m).   Weight as of this encounter: 207 lb (93.9 kg).  BMI interpretation table: BMI level Category Range association with higher incidence of chronic pain  <18 kg/m2 Underweight   18.5-24.9 kg/m2 Ideal body weight   25-29.9 kg/m2 Overweight Increased incidence by 20%  30-34.9 kg/m2 Obese (Class I) Increased incidence by 68%  35-39.9 kg/m2 Severe obesity (Class II) Increased incidence by 136%  >40 kg/m2 Extreme obesity (Class III) Increased incidence by 254%   Patient's current BMI Ideal Body weight  Body mass index is 29.7 kg/m. Ideal body weight: 73 kg (160 lb 15 oz) Adjusted ideal body weight: 81.4 kg (179 lb 5.8 oz)   BMI Readings from Last 4 Encounters:  02/11/18  29.70 kg/m  02/03/18 28.87 kg/m  02/02/18 29.71 kg/m  01/23/18 32.14 kg/m   Wt Readings from Last 4 Encounters:  02/11/18 207 lb (93.9 kg)  02/03/18 207 lb (93.9 kg)  02/02/18 207 lb 0.6 oz (93.9 kg)  01/23/18 224 lb (101.6 kg)  Psych/Mental status: Alert, oriented x 3 (person, place, & time)       Eyes: PERLA Respiratory: No evidence of acute respiratory distress  Cervical Spine Area Exam  Skin & Axial Inspection: No masses, redness, edema, swelling, or associated skin lesions Alignment: Symmetrical Functional ROM: Unrestricted ROM  Stability: No instability detected Muscle Tone/Strength: Functionally intact. No obvious neuro-muscular anomalies detected. Sensory (Neurological): Unimpaired Palpation: No palpable anomalies              Upper Extremity (UE) Exam    Side: Right upper extremity  Side: Left upper extremity  Skin & Extremity Inspection: Skin color, temperature, and hair growth are WNL. No peripheral edema or cyanosis. No masses, redness, swelling, asymmetry, or associated skin lesions. No contractures.  Skin & Extremity Inspection: Skin color, temperature, and hair growth are WNL. No peripheral edema or cyanosis. No masses, redness, swelling, asymmetry, or associated skin lesions. No contractures.  Functional ROM: Decreased ROM for shoulder  Functional ROM: Unrestricted ROM          Muscle Tone/Strength: Functionally intact. No obvious neuro-muscular anomalies detected.  Muscle Tone/Strength: Functionally intact. No obvious neuro-muscular anomalies detected.  Sensory (Neurological): Arthropathic arthralgia          Sensory (Neurological): Unimpaired          Palpation: Complains of area being tender to palpation              Palpation: No palpable anomalies              Provocative Test(s):  Phalen's test: deferred Tinel's test: deferred Apley's scratch test (touch opposite shoulder):  Action 1 (Across chest): Decreased ROM Action 2 (Overhead): Decreased  ROM Action 3 (LB reach): Decreased ROM   Provocative Test(s):  Phalen's test: deferred Tinel's test: deferred Apley's scratch test (touch opposite shoulder):  Action 1 (Across chest): deferred Action 2 (Overhead): deferred Action 3 (LB reach): deferred    Thoracic Spine Area Exam  Skin & Axial Inspection: No masses, redness, or swelling Alignment: Symmetrical Functional ROM: Unrestricted ROM Stability: No instability detected Muscle Tone/Strength: Functionally intact. No obvious neuro-muscular anomalies detected. Sensory (Neurological): Unimpaired Muscle strength & Tone: No palpable anomalies  Lumbar Spine Area Exam  Skin & Axial Inspection: No masses, redness, or swelling Alignment: Symmetrical Functional ROM: Decreased ROM affecting both sides Stability: No instability detected Muscle Tone/Strength: Functionally intact. No obvious neuro-muscular anomalies detected. Sensory (Neurological): Musculoskeletal pain pattern Palpation: No palpable anomalies       Provocative Tests: Lumbar Hyperextension/rotation test: (+) bilaterally for facet joint pain. Lumbar quadrant test (Kemp's test): deferred today       Lumbar Lateral bending test: deferred today       Patrick's Maneuver: deferred today                   FABER test: deferred today       Thigh-thrust test: deferred today       S-I compression test: deferred today       S-I distraction test: deferred today        Gait & Posture Assessment  Ambulation: Limited Gait: Antalgic Posture: Difficulty standing up straight, due to pain   Lower Extremity Exam    Side: Right lower extremity  Side: Left lower extremity  Stability: No instability observed          Stability: No instability observed          Skin & Extremity Inspection: Skin color, temperature, and hair growth are WNL. No peripheral edema or cyanosis. No masses, redness, swelling, asymmetry, or associated skin lesions. No contractures.  Skin & Extremity Inspection:  Skin color, temperature, and hair growth are WNL. No peripheral edema or cyanosis. No masses, redness, swelling, asymmetry, or associated skin lesions. No contractures.  Functional ROM: Unrestricted ROM  Functional ROM: Unrestricted ROM                  Muscle Tone/Strength: Functionally intact. No obvious neuro-muscular anomalies detected.  Muscle Tone/Strength: Functionally intact. No obvious neuro-muscular anomalies detected.  Sensory (Neurological): Arthropathic arthralgia  Sensory (Neurological): Arthropathic arthralgia  Palpation: No palpable anomalies  Palpation: No palpable anomalies   Assessment  Primary Diagnosis & Pertinent Problem List: The primary encounter diagnosis was Primary osteoarthritis of both knees. Diagnoses of Chronic pain of both shoulders, Lumbar spondylosis, Lumbar degenerative disc disease, Chronic prescription benzodiazepine use, Chronic pain syndrome, and Osteoarthritis of both shoulders, unspecified osteoarthritis type were also pertinent to this visit.  Status Diagnosis  Persistent Persistent Persistent 1. Primary osteoarthritis of both knees   2. Chronic pain of both shoulders   3. Lumbar spondylosis   4. Lumbar degenerative disc disease   5. Chronic prescription benzodiazepine use   6. Chronic pain syndrome   7. Osteoarthritis of both shoulders, unspecified osteoarthritis type      General Recommendations: The pain condition that the patient suffers from is best treated with a multidisciplinary approach that involves an increase in physical activity to prevent de-conditioning and worsening of the pain cycle, as well as psychological counseling (formal and/or informal) to address the co-morbid psychological affects of pain. Treatment will often involve judicious use of pain medications and interventional procedures to decrease the pain, allowing the patient to participate in the physical activity that will ultimately produce long-lasting pain  reductions. The goal of the multidisciplinary approach is to return the patient to a higher level of overall function and to restore their ability to perform activities of daily living.  72 year old male who presents with bilateral shoulder pain (history of left shoulder rotator cuff repair, told that he needs to have his right shoulder surgically repaired but wants to avoid), bilateral knee pain secondary to advanced osteoarthritis, and axial low back pain secondary to lumbar degenerative disc disease and lumbar spondylosis.Patient is status post a series of 3 intra-articular Hyalgan knee injections bilaterally along with a right posterior shoulder joint injection with steroid.   Patient presents for follow-up today.  He states that he is doing well on his current medication regimen.  He is taking his medications as prescribed and is been compliant with therapy.  He states that his right shoulder is also doing better.  He is able to do more throughout the day and is endorsing improvement in his functional status.  Patient notes abdominal discomfort as well as intermittent diarrhea.  He is scheduled for a colonoscopy here soon.  Patient also endorses difficulty with urination.  He is scheduled to see urology as well.  Plan of Care  Pharmacotherapy (Medications Ordered): Meds ordered this encounter  Medications  . DISCONTD: oxyCODONE-acetaminophen (PERCOCET) 10-325 MG tablet    Sig: Take 1 tablet by mouth every 8 (eight) hours as needed for pain.    Dispense:  90 tablet    Refill:  0    Do not place this medication, or any other prescription from our practice, on "Automatic Refill". Patient may have prescription filled one day early if pharmacy is closed on scheduled refill date. Fill on or after: 02/27/2018, 03/29/2018, 04/28/2018 For chronic pain To last 30 days from fill date  . DISCONTD: oxyCODONE-acetaminophen (PERCOCET) 10-325 MG tablet    Sig: Take 1 tablet by mouth every 8 (eight) hours as  needed for pain.    Dispense:  90 tablet    Refill:  0  Do not place this medication, or any other prescription from our practice, on "Automatic Refill". Patient may have prescription filled one day early if pharmacy is closed on scheduled refill date. Fill on or after: 02/27/2018, 03/29/2018, 04/28/2018 For chronic pain To last 30 days from fill date  . oxyCODONE-acetaminophen (PERCOCET) 10-325 MG tablet    Sig: Take 1 tablet by mouth every 8 (eight) hours as needed for pain.    Dispense:  90 tablet    Refill:  0    Do not place this medication, or any other prescription from our practice, on "Automatic Refill". Patient may have prescription filled one day early if pharmacy is closed on scheduled refill date. Fill on or after: 02/27/2018, 03/29/2018, 04/28/2018 For chronic pain To last 30 days from fill date   Considering:   -Lumbar facet medial branch nerve blocks for lumbar spondylosis -Bilateral SI joint injection -Bilateral genicular nerve block -R shoulder injection   Provider-requested follow-up: Return in about 3 months (around 05/14/2018) for Medication Management.  Time Note: Greater than 50% of the 25 minute(s) of face-to-face time spent with Jack [name], was spent in counseling/coordination of care regarding: Jack [name] primary cause of pain, the treatment plan, treatment alternatives, the opioid analgesic risks and possible complications, the appropriate use of his medications, realistic expectations, the goals of pain management (increased in functionality), the medication agreement and the patient's responsibilities when it comes to controlled substances.  Future Appointments  Date Time Provider [place]  02/18/2018 10:40 AM Caren Macadam, MD [facility]    Primary Care Physician: Caren Macadam, MD Location: [facility] Note by: [name], M.D Date: 02/11/2018; Time: 2:22 PM  Patient Instructions  You were given 3 prescriptions for  Percocet today.

## 2018-02-11 NOTE — Patient Instructions (Signed)
You were given 3 prescriptions for Percocet today. 

## 2018-02-11 NOTE — Progress Notes (Signed)
Nursing Pain Medication Assessment:  Safety precautions to be maintained throughout the outpatient stay will include: orient to surroundings, keep bed in low position, maintain call bell within reach at all times, provide assistance with transfer out of bed and ambulation.  Medication Inspection Compliance: Pill count conducted under aseptic conditions, in front of the patient. Neither the pills nor the bottle was removed from the patient's sight at any time. Once count was completed pills were immediately returned to the patient in their original bottle.  Medication: Oxycodone/APAP Pill/Patch Count: 50 of 90 pills remain Pill/Patch Appearance: Markings consistent with prescribed medication Bottle Appearance: Standard pharmacy container. Clearly labeled. Filled Date: 06/07 / 2019 Last Medication intake:  Today

## 2018-02-15 ENCOUNTER — Telehealth (INDEPENDENT_AMBULATORY_CARE_PROVIDER_SITE_OTHER): Payer: Self-pay | Admitting: Internal Medicine

## 2018-02-15 ENCOUNTER — Encounter (INDEPENDENT_AMBULATORY_CARE_PROVIDER_SITE_OTHER): Payer: Self-pay | Admitting: *Deleted

## 2018-02-15 ENCOUNTER — Encounter: Payer: Medicare HMO | Admitting: Student in an Organized Health Care Education/Training Program

## 2018-02-15 DIAGNOSIS — A0472 Enterocolitis due to Clostridium difficile, not specified as recurrent: Secondary | ICD-10-CM

## 2018-02-15 MED ORDER — VANCOMYCIN HCL 125 MG PO CAPS: 125.0000 mg | ORAL_CAPSULE | Freq: Four times a day (QID) | ORAL | 0 refills | Status: DC

## 2018-02-15 NOTE — Telephone Encounter (Signed)
TCS resch'd to 04/21/18, patient aware, new instruction sheet mailed

## 2018-02-15 NOTE — Telephone Encounter (Signed)
Rx for Vancomycin sent to his pharmacy 

## 2018-02-15 NOTE — Telephone Encounter (Signed)
Jack [name] is scheduled for a colonoscopy soon. He has a positive C-diff and needs to be treated. Will have to put colonoscopy further out.  I have tried calling him but have not got him yet to give him his results.

## 2018-02-18 ENCOUNTER — Encounter: Payer: Self-pay | Admitting: Family Medicine

## 2018-02-18 ENCOUNTER — Other Ambulatory Visit: Payer: Self-pay

## 2018-02-18 ENCOUNTER — Ambulatory Visit (INDEPENDENT_AMBULATORY_CARE_PROVIDER_SITE_OTHER): Payer: Medicare HMO | Admitting: Family Medicine

## 2018-02-18 VITALS — BP 116/74 | HR 89 | Temp 98.1°F | Resp 18 | Ht 70.0 in | Wt 206.1 lb

## 2018-02-18 DIAGNOSIS — Z720 Tobacco use: Secondary | ICD-10-CM

## 2018-02-18 DIAGNOSIS — I10 Essential (primary) hypertension: Secondary | ICD-10-CM

## 2018-02-18 DIAGNOSIS — E785 Hyperlipidemia, unspecified: Secondary | ICD-10-CM

## 2018-02-18 DIAGNOSIS — F324 Major depressive disorder, single episode, in partial remission: Secondary | ICD-10-CM

## 2018-02-18 NOTE — Progress Notes (Signed)
Patient ID: Jack [name], male    DOB: 02/07/1946, 72 y.o.   MRN: [id number]  Chief Complaint  Patient presents with  . generalized abdominal pain    follow up    Allergies Patient has no known allergies.  Subjective:   Jack [name] is a 72 y.o. male who presents to [facility] today.  HPI Here for follow up of his blood pressure and his mood.  Since he was seen here last he has been diagnosed with C. difficile colitis.  He is taking vancomycin 3 times a day.  He reports that he is still having some abdominal discomfort but the pain is better than it was when he came here last.  He has not lost any weight since his last visit.  He is also being evaluated by urology for urologic symptoms.  He reports that he feels like he is doing great mentally but not as good physically.  Reports he is ready to get off these antibiotics for the infection in his bowels.  He has been taking his depression medicine every day.  He reports his family feels like he is doing well from his mental aspect.   Family is visiting him daily. Eating well. Grandchildren visit him multiple times a day.  He reports that he is urinating well.  Has not seen any blood in his urine.  Appetite is good.  He denies any nausea, vomiting, dizziness, syncope, chest pain.  He is still smoking cigarettes on a daily basis and is not yet ready to quit.   Past Medical History:  Diagnosis Date  . Coronary atherosclerosis of native coronary artery    Multivessel status post CABG  . HLD (hyperlipidemia)   . Hypertension   . Tobacco user     Past Surgical History:  Procedure Laterality Date  . APPENDECTOMY     age 59  . CHOLECYSTECTOMY  2003  . CORONARY ARTERY BYPASS GRAFT  2008   LIMA to LAD, SVG to ramus, SVG to PDA and PLA  . FOOT SURGERY    . KNEE ARTHROPLASTY    . ROTATOR CUFF REPAIR    . SHOULDER SURGERY      Family History  Problem Relation Age of Onset  . Heart attack Mother   . Cerebral aneurysm  Father   . Coronary artery disease Neg Hx   . Diabetes Neg Hx   . Hypertension Neg Hx      Social History   Socioeconomic History  . Marital status: Widowed    Spouse name: Not on file  . Number of children: Not on file  . Years of education: Not on file  . Highest education level: Not on file  Occupational History  . Occupation: retired  Scientific laboratory technician  . Financial resource strain: Not on file  . Food insecurity:    Worry: Not on file    Inability: Not on file  . Transportation needs:    Medical: Not on file    Non-medical: Not on file  Tobacco Use  . Smoking status: Current Every Day Smoker    Packs/day: 1.00    Years: 57.00    Pack years: 57.00    Types: Cigarettes  . Smokeless tobacco: Never Used  Substance and Sexual Activity  . Alcohol use: Yes    Alcohol/week: 0.0 oz  . Drug use: No  . Sexual activity: Never  Lifestyle  . Physical activity:    Days per week: Not on file  Minutes per session: Not on file  . Stress: Not on file  Relationships  . Social connections:    Talks on phone: Not on file    Gets together: Not on file    Attends religious service: Not on file    Active member of club or organization: Not on file    Attends meetings of clubs or organizations: Not on file    Relationship status: Not on file  Other Topics Concern  . Not on file  Social History Narrative   Had been married for over 42 years and wife passed away in Dec 02, 2015 from cancer. Has children and grandchildren and great grandchildren. Gets together with family for dinner frequently. Lives alone and cooks for self. Live in [place]. 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 Eats all food groups.     Current Outpatient Medications on File Prior to Visit  Medication Sig Dispense Refill  . amLODipine (NORVASC) 5 MG tablet Take 1 tablet (5 mg total) by mouth every other day. 90 tablet 1  . aspirin EC 81 MG tablet Take 1  tablet (81 mg total) by mouth daily. 90 tablet 3  . Cholecalciferol (VITAMIN D3) 1000 units CAPS Take 1,000 Units by mouth daily.    . citalopram (CELEXA) 40 MG tablet Take 1 tablet (40 mg total) by mouth daily. 90 tablet 1  . oxyCODONE-acetaminophen (PERCOCET) 10-325 MG tablet Take 1 tablet by mouth every 8 (eight) hours as needed for pain. 90 tablet 0  . polyethylene glycol (MIRALAX / GLYCOLAX) packet Take 17 g by mouth 2 (two) times daily.    . vancomycin (VANCOCIN) 125 MG capsule Take 1 capsule (125 mg total) by mouth 4 (four) times daily. 40 capsule 0   No current facility-administered medications on file prior to visit.     Review of Systems   Objective:   BP 116/74 (BP Location: Left Arm, Patient Position: Sitting, Cuff Size: Normal)   Pulse 89   Temp 98.1 F (36.7 C) (Temporal)   Resp 18   Ht 5\' 10"  (1.778 m)   Wt 206 lb 1.3 oz (93.5 kg)   SpO2 95%   BMI 29.57 kg/m   Physical Exam  Constitutional: He is oriented to person, place, and time. He appears well-developed and well-nourished.  HENT:  Head: Normocephalic and atraumatic.  Eyes: Pupils are equal, round, and reactive to light. Conjunctivae and EOM are normal. No scleral icterus.  Cardiovascular: Normal rate, regular rhythm and normal heart sounds.  Pulmonary/Chest: Effort normal and breath sounds normal. He has no wheezes. He has no rales.  Abdominal: Soft. Bowel sounds are normal. He exhibits no distension. There is no tenderness. There is no guarding.  Neurological: He is alert and oriented to person, place, and time. No cranial nerve deficit.  Skin: Skin is warm, dry and intact.  Psychiatric: He has a normal mood and affect. His behavior is normal. Judgment and thought content normal.  Vitals reviewed.   Depression screen [facility] 2/9 02/18/2018 02/11/2018 02/02/2018 11/10/2017 09/01/2017  Decreased Interest 0 0 1 0 0  Down, Depressed, Hopeless 1 0 0 0 0  PHQ - 2 Score 1 0 1 0 0  Altered sleeping - - - - -  Tired,  decreased energy - - - - -  Change in appetite - - - - -  Feeling bad or failure about yourself  - - - - -  Trouble concentrating - - - - -  Moving slowly or fidgety/restless - - - - -  Suicidal thoughts - - - - -  PHQ-9 Score - - - - -  Difficult doing work/chores - - - - -    Assessment and Plan  1. Depression, major, single episode, in partial remission ([place]) Continue current medication.  Continue Celexa p.o. daily.  Family visits and outings encouraged.  Patient defers talking with therapist.  Call with any questions, concerns, changes in mood. Suicide risks evaluated and documented in note if present or in the area below.  Patient has protective factors of family and community support.  Patient reports that family believes is behaving rationally. Patient displays problem solving skills.   Patient specifically denies suicide ideation. Patient has access/information to healthcare contacts if situation or mood changes where patient is a risk to self or others or mood becomes unstable.   During the encounter, the patient had good eye contact and firm handshake regarding safety contract and agreement to seek help if mood worsens and not to harm self.   Patient understands the treatment plan and is in agreement. Agrees to keep follow up and call prior or return to clinic if needed.   2. Essential hypertension, benign Stable.  Continue current medication as directed.  3. Hyperlipidemia LDL goal <70 Lifestyle modifications discussed with patient including a diet emphasizing vegetables, fruits, and whole grains. Limiting intake of sodium to less than 2,400 mg per day.  Recommendations discussed include consuming low-fat dairy products, poultry, fish, legumes, non-tropical vegetable oils, and nuts; and limiting intake of sweets, sugar-sweetened beverages, and red meat. Discussed following a plan such as the Dietary Approaches to Stop Hypertension (DASH) diet. Patient to read up on this diet.   Patient defers statins at this time.  LDL goal discussed.  4. Tobacco abuse The 5 A's Model for treating Tobacco Use and Dependence was used today. I have identified and documented tobacco use status for this patient. I have urged the patient to quit tobacco use. At this time, the patient is unwilling and not ready to attempt to quit. I have provided patient with information regarding risks, cessation techniques, and interventions that might increase future attempts to quit smoking. I will plan on again addressing tobacco dependence at the next visit.  Patient is going to find a new PCP.  He request to be seen in 3 months prior to my departure from this practice.  Will follow-up in 3 months.  His sons will come with him.  We will make sure all his medications are refilled and he understands his treatment plan.  He will find a new PCP to resume his care.  He agrees to keep his scheduled visits with gastroenterology and urology.  He will call with any questions or concerns.  Return in about 3 months (around 05/21/2018). Caren Macadam, MD 02/18/2018

## 2018-03-04 ENCOUNTER — Other Ambulatory Visit: Payer: Self-pay | Admitting: Family Medicine

## 2018-03-04 DIAGNOSIS — G4700 Insomnia, unspecified: Secondary | ICD-10-CM

## 2018-03-05 ENCOUNTER — Other Ambulatory Visit: Payer: Self-pay | Admitting: Family Medicine

## 2018-03-05 DIAGNOSIS — I25810 Atherosclerosis of coronary artery bypass graft(s) without angina pectoris: Secondary | ICD-10-CM

## 2018-04-21 ENCOUNTER — Other Ambulatory Visit: Payer: Self-pay

## 2018-04-21 ENCOUNTER — Encounter (HOSPITAL_COMMUNITY): Payer: Self-pay | Admitting: *Deleted

## 2018-04-21 ENCOUNTER — Encounter (HOSPITAL_COMMUNITY)
Admission: RE | Disposition: A | Discharge status: Home / Self Care | Payer: Self-pay | Source: Ambulatory Visit | Attending: Internal Medicine

## 2018-04-21 ENCOUNTER — Ambulatory Visit (HOSPITAL_COMMUNITY)
Admission: RE | Admit: 2018-04-21 | Discharge: 2018-04-21 | Disposition: A | Discharge status: Home / Self Care | Payer: Medicare HMO | Source: Ambulatory Visit | Attending: Internal Medicine | Admitting: Internal Medicine

## 2018-04-21 DIAGNOSIS — E785 Hyperlipidemia, unspecified: Secondary | ICD-10-CM | POA: Insufficient documentation

## 2018-04-21 DIAGNOSIS — K5730 Diverticulosis of large intestine without perforation or abscess without bleeding: Secondary | ICD-10-CM | POA: Insufficient documentation

## 2018-04-21 DIAGNOSIS — R195 Other fecal abnormalities: Secondary | ICD-10-CM

## 2018-04-21 DIAGNOSIS — K644 Residual hemorrhoidal skin tags: Secondary | ICD-10-CM | POA: Insufficient documentation

## 2018-04-21 DIAGNOSIS — I714 Abdominal aortic aneurysm, without rupture: Secondary | ICD-10-CM | POA: Diagnosis not present

## 2018-04-21 DIAGNOSIS — D125 Benign neoplasm of sigmoid colon: Secondary | ICD-10-CM

## 2018-04-21 DIAGNOSIS — F17210 Nicotine dependence, cigarettes, uncomplicated: Secondary | ICD-10-CM | POA: Insufficient documentation

## 2018-04-21 DIAGNOSIS — Z87442 Personal history of urinary calculi: Secondary | ICD-10-CM | POA: Diagnosis not present

## 2018-04-21 DIAGNOSIS — I10 Essential (primary) hypertension: Secondary | ICD-10-CM | POA: Insufficient documentation

## 2018-04-21 DIAGNOSIS — Z79899 Other long term (current) drug therapy: Secondary | ICD-10-CM | POA: Diagnosis not present

## 2018-04-21 DIAGNOSIS — Z951 Presence of aortocoronary bypass graft: Secondary | ICD-10-CM | POA: Insufficient documentation

## 2018-04-21 DIAGNOSIS — K6289 Other specified diseases of anus and rectum: Secondary | ICD-10-CM

## 2018-04-21 DIAGNOSIS — I2510 Atherosclerotic heart disease of native coronary artery without angina pectoris: Secondary | ICD-10-CM | POA: Diagnosis not present

## 2018-04-21 DIAGNOSIS — R197 Diarrhea, unspecified: Secondary | ICD-10-CM | POA: Insufficient documentation

## 2018-04-21 DIAGNOSIS — D123 Benign neoplasm of transverse colon: Secondary | ICD-10-CM | POA: Insufficient documentation

## 2018-04-21 DIAGNOSIS — Z7982 Long term (current) use of aspirin: Secondary | ICD-10-CM | POA: Diagnosis not present

## 2018-04-21 HISTORY — DX: Dyspnea, unspecified: R06.00

## 2018-04-21 HISTORY — DX: Personal history of urinary calculi: Z87.442

## 2018-04-21 HISTORY — PX: POLYPECTOMY: SHX5525

## 2018-04-21 HISTORY — PX: COLONOSCOPY: SHX5424

## 2018-04-21 SURGERY — COLONOSCOPY
Anesthesia: Moderate Sedation

## 2018-04-21 MED ORDER — MEPERIDINE HCL 50 MG/ML IJ SOLN
INTRAMUSCULAR | Status: DC | PRN
  Administered 2018-04-21 (×2): 25 mg via INTRAVENOUS

## 2018-04-21 MED ORDER — SODIUM CHLORIDE 0.9 % IV SOLN
INTRAVENOUS | Status: DC
  Administered 2018-04-21: 1000 mL via INTRAVENOUS

## 2018-04-21 MED ORDER — MEPERIDINE HCL 50 MG/ML IJ SOLN
INTRAMUSCULAR | Status: AC
  Filled 2018-04-21: qty 1

## 2018-04-21 MED ORDER — STERILE WATER FOR IRRIGATION IR SOLN
Status: DC | PRN
  Administered 2018-04-21: 1.5 mL

## 2018-04-21 MED ORDER — MIDAZOLAM HCL 5 MG/5ML IJ SOLN
INTRAMUSCULAR | Status: DC | PRN
  Administered 2018-04-21: 2 mg via INTRAVENOUS
  Administered 2018-04-21: 1 mg via INTRAVENOUS
  Administered 2018-04-21: 2 mg via INTRAVENOUS

## 2018-04-21 MED ORDER — MIDAZOLAM HCL 5 MG/5ML IJ SOLN
INTRAMUSCULAR | Status: AC
  Filled 2018-04-21: qty 10

## 2018-04-21 NOTE — H&P (Signed)
Jack [name] is an 72 y.o. male.   Chief Complaint: Patient is here for colonoscopy. HPI: Patient is 72 year old Caucasian male who presents with over 45-month history of lower abdominal pain associated with anorexia and he states he has lost 40 pounds.  His stool was noted to be heme positive by Dr. Mannie Stabile.  He had abdominopelvic CT while in the emergency room in June 2019 which revealed abdominal aortic aneurysm measuring 31 mm.  Also had colonic diverticulosis but no other abnormalities.  GI pathogen panel came back positive for C. difficile and he was treated with vancomycin for 10 days.  He says his pain has not eased any.  He is not having any diarrhea.  He is having to take MiraLAX to make his bowels move.  He says he does not have a good appetite.  He generally eats 1 meal every evening.  His pain is worse when he wakes up in the morning.  Is across lower abdomen.  Says it eases with pain medication.  He has pain-free at this time.  No history of melena or rectal bleeding. He has never been screened for CRC.  Family history is negative for CRC. Drinks alcohol occasionally.  He smokes half a pack of cigarettes daily.  Past Medical History:  Diagnosis Date  . Coronary atherosclerosis of native coronary artery    Multivessel status post CABG  . Dyspnea    with over exertion  . History of kidney stones   . HLD (hyperlipidemia)   . Hypertension   . Tobacco user     Past Surgical History:  Procedure Laterality Date  . APPENDECTOMY     age 23  . CHOLECYSTECTOMY  2003  . CORONARY ARTERY BYPASS GRAFT  2008   LIMA to LAD, SVG to ramus, SVG to PDA and PLA  . FOOT SURGERY    . KNEE ARTHROPLASTY    . ROTATOR CUFF REPAIR    . SHOULDER SURGERY      Family History  Problem Relation Age of Onset  . Heart attack Mother   . Cerebral aneurysm Father   . Coronary artery disease Neg Hx   . Diabetes Neg Hx   . Hypertension Neg Hx    Social History:  reports that he has been smoking  cigarettes. He has a 57.00 pack-year smoking history. He has never used smokeless tobacco. He reports that he drinks alcohol. He reports that he does not use drugs.  Allergies: No Known Allergies  Medications Prior to Admission  Medication Sig Dispense Refill  . amLODipine (NORVASC) 5 MG tablet Take 1 tablet (5 mg total) by mouth every other day. 90 tablet 1  . aspirin EC 81 MG tablet Take 1 tablet (81 mg total) by mouth daily. 90 tablet 3  . carvedilol (COREG) 6.25 MG tablet TAKE TWO (2) TABLETS BY MOUTH EVERY *OTHER* DAY (Patient taking differently: Take 12.5 mg by mouth every other day. ) 90 tablet 1  . Cholecalciferol (VITAMIN D3) 1000 units CAPS Take 1,000 Units by mouth daily.    . citalopram (CELEXA) 40 MG tablet Take 1 tablet (40 mg total) by mouth daily. 90 tablet 1  . oxyCODONE-acetaminophen (PERCOCET) 10-325 MG tablet Take 1 tablet by mouth every 8 (eight) hours as needed for pain. 90 tablet 0  . polyethylene glycol (MIRALAX / GLYCOLAX) packet Take 17 g by mouth daily.     . traZODone (DESYREL) 100 MG tablet Take 100 mg by mouth at bedtime.  1  No results found for this or any previous visit (from the past 48 hour(s)). No results found.  ROS  Blood pressure (!) 143/74, pulse (!) 57, temperature 97.6 F (36.4 C), temperature source Oral, resp. rate 19, height 5\' 10"  (1.778 m), weight 93.4 kg, SpO2 96 %. Physical Exam  Constitutional: He appears well-developed and well-nourished.  HENT:  Mouth/Throat: Oropharynx is clear and moist.  Eyes: Conjunctivae are normal. No scleral icterus.  Cardiovascular: Normal rate, regular rhythm and normal heart sounds.  No murmur heard. Respiratory: Effort normal and breath sounds normal.  Midsternal scar.  GI:  Abdomen is symmetrical.  It is soft with mild tenderness at LLQ.  No organomegaly or masses.  Musculoskeletal: He exhibits edema.  Trace edema around ankles.  Neurological: He is alert.  Skin: Skin is warm and dry.      Assessment/Plan Heme positive stool. Lower abdominal pain. Diagnostic colonoscopy.  Hildred Laser, MD 04/21/2018, 10:34 AM

## 2018-04-21 NOTE — Op Note (Addendum)
[facility] Patient Name: Jack [name] Procedure Date: 04/21/2018 10:04 AM MRN: [id number] Date of Birth: 09-16-1945 Attending MD: Hildred Laser , MD CSN: [id number] Age: 72 Admit Type: Outpatient Procedure:                Colonoscopy Indications:              Heme positive stool Providers:                Hildred Laser, MD, Charlsie Quest. Theda Sers RN, RN, Aram Candela Referring MD:             Caren Macadam, Md Medicines:                Meperidine 50 mg IV, Midazolam 5 mg IV Complications:            No immediate complications. Estimated Blood Loss:     Estimated blood loss was minimal. Procedure:                Pre-Anesthesia Assessment:                           - Prior to the procedure, a History and Physical                            was performed, and patient medications and                            allergies were reviewed. The patient's tolerance of                            previous anesthesia was also reviewed. The risks                            and benefits of the procedure and the sedation                            options and risks were discussed with the patient.                            All questions were answered, and informed consent                            was obtained. Prior Anticoagulants: The patient                            last took aspirin 3 days prior to the procedure.                            ASA Grade Assessment: III - A patient with severe                            systemic disease. After reviewing the risks and  benefits, the patient was deemed in satisfactory                            condition to undergo the procedure.                           After obtaining informed consent, the colonoscope                            was passed under direct vision. Throughout the                            procedure, the patient's blood pressure, pulse, and                            oxygen  saturations were monitored continuously. The                            PCF-[id number] ([id number]) was introduced through the                            anus and advanced to the the cecum, identified by                            appendiceal orifice and ileocecal valve. The                            colonoscopy was performed without difficulty. The                            patient tolerated the procedure well. The quality                            of the bowel preparation was adequate. The                            ileocecal valve, appendiceal orifice, and rectum                            were photographed. Scope In: 10:49:07 AM Scope Out: 11:34:52 AM Scope Withdrawal Time: 0 hours 37 minutes 23 seconds  Total Procedure Duration: 0 hours 45 minutes 45 seconds  Findings:      The perianal and digital rectal examinations were normal.      Three sessile polyps were found in the transverse colon. The polyps were       4 to 7 mm in size. These polyps were removed with a cold snare.       Resection and retrieval were complete. The pathology specimen was placed       into Bottle Number 1.      A 10 mm polyp was found in the splenic flexure. The polyp was sessile.       The polyp was removed with a hot snare. Resection and retrieval were       complete. The pathology specimen was placed into Bottle Number 1.  A large polyp was found in the mid sigmoid colon. The polyp was       {skip}broad based. The polyp was removed with a piecemeal technique       using a hot snare. Resection and retrieval were complete using a Roth       net. Three hemostatic clips were successfully placed (MR conditional).      Multiple medium-mouthed diverticula were found in the sigmoid colon.      External hemorrhoids were found during retroflexion. The hemorrhoids       were small.      Anal papilla was hypertrophied. Impression:               - Three 4 to 7 mm polyps in the transverse colon,                             removed with a cold snare. Resected and retrieved.                           - One 10 mm polyp at the splenic flexure, removed                            with a hot snare. Resected and retrieved.                           - One large polyp in the mid sigmoid colon, removed                            piecemeal using a hot snare. Resected and                            retrieved. Clips (MR conditional) were placed.                           - Diverticulosis in the sigmoid colon.                           - External hemorrhoids.                           - Anal papilla. Moderate Sedation:      Moderate (conscious) sedation was administered by the endoscopy nurse       and supervised by the endoscopist. The following parameters were       monitored: oxygen saturation, heart rate, blood pressure, CO2       capnography and response to care. Total physician intraservice time was       50 minutes. Recommendation:           - Patient has a contact number available for                            emergencies. The signs and symptoms of potential                            delayed complications were discussed with the  patient. Return to normal activities tomorrow.                            Written discharge instructions were provided to the                            patient.                           - High fiber diet today.                           - Continue present medications.                           - No aspirin, ibuprofen, naproxen, or other                            non-steroidal anti-inflammatory drugs for 7 days                            after polyp removal.                           - Await pathology results.                           - Repeat colonoscopy is recommended. The                            colonoscopy date will be determined after pathology                            results from today's exam become available for                             review. Procedure Code(s):        --- Professional ---                           [phone], Colonoscopy, flexible; with removal of                            tumor(s), polyp(s), or other lesion(s) by snare                            technique                           G0500, Moderate sedation services provided by the                            same physician or other qualified health care                            professional performing a gastrointestinal  endoscopic service that sedation supports,                            requiring the presence of an independent trained                            observer to assist in the monitoring of the                            patient's level of consciousness and physiological                            status; initial 15 minutes of intra-service time;                            patient age 21 years or older (additional time may                            be reported with [phone], as appropriate)                           [phone], Moderate sedation services provided by the                            same physician or other qualified health care                            professional performing the diagnostic or                            therapeutic service that the sedation supports,                            requiring the presence of an independent trained                            observer to assist in the monitoring of the                            patient's level of consciousness and physiological                            status; each additional 15 minutes intraservice                            time (List separately in addition to code for                            primary service)                           [phone], Moderate sedation services provided by the  same physician or other qualified health care                            professional performing the diagnostic or                             therapeutic service that the sedation supports,                            requiring the presence of an independent trained                            observer to assist in the monitoring of the                            patient's level of consciousness and physiological                            status; each additional 15 minutes intraservice                            time (List separately in addition to code for                            primary service) Diagnosis Code(s):        --- Professional ---                           D12.3, Benign neoplasm of transverse colon (hepatic                            flexure or splenic flexure)                           D12.5, Benign neoplasm of sigmoid colon                           K64.4, Residual hemorrhoidal skin tags                           K62.89, Other specified diseases of anus and rectum                           R19.5, Other fecal abnormalities                           K57.30, Diverticulosis of large intestine without                            perforation or abscess without bleeding CPT copyright 2017 [employer]. All rights reserved. The codes documented in this report are preliminary and upon coder review may  be revised to meet current compliance requirements. Hildred Laser, MD Hildred Laser, MD 04/21/2018 11:53:54 AM This report has been signed electronically. Number of Addenda: 0

## 2018-04-21 NOTE — Discharge Instructions (Signed)
No aspirin or NSAIDs for 1 week. Resume other medications as before. High-fiber diet. No driving for 24 hours. Physician will call with biopsy results.  Colonoscopy, Adult, Care After This sheet gives you information about how to care for yourself after your procedure. Your doctor may also give you more specific instructions. If you have problems or questions, call your doctor. Follow these instructions at home: General instructions   For the first 24 hours after the procedure: ? Do not drive or use machinery. ? Do not sign important documents. ? Do not drink alcohol. ? Do your daily activities more slowly than normal. ? Eat foods that are soft and easy to digest. ? Rest often.  Take over-the-counter or prescription medicines only as told by your doctor.  It is up to you to get the results of your procedure. Ask your doctor, or the department performing the procedure, when your results will be ready. To help cramping and bloating:  Try walking around.  Put heat on your belly (abdomen) as told by your doctor. Use a heat source that your doctor recommends, such as a moist heat pack or a heating pad. ? Put a towel between your skin and the heat source. ? Leave the heat on for 20-30 minutes. ? Remove the heat if your skin turns bright red. This is especially important if you cannot feel pain, heat, or cold. You can get burned. Eating and drinking  Drink enough fluid to keep your pee (urine) clear or pale yellow.  Return to your normal diet as told by your doctor. Avoid heavy or fried foods that are hard to digest.  Avoid drinking alcohol for as long as told by your doctor. Contact a doctor if:  You have blood in your poop (stool) 2-3 days after the procedure. Get help right away if:  You have more than a small amount of blood in your poop.  You see large clumps of tissue (blood clots) in your poop.  Your belly is swollen.  You feel sick to your stomach (nauseous).  You  throw up (vomit).  You have a fever.  You have belly pain that gets worse, and medicine does not help your pain. This information is not intended to replace advice given to you by your health care provider. Make sure you discuss any questions you have with your health care provider. Document Released: 09/13/2010 Document Revised: 05/05/2016 Document Reviewed: 05/05/2016 Elsevier Interactive Patient Education  2017 [place].    Colon Polyps Polyps are tissue growths inside the body. Polyps can grow in many places, including the large intestine (colon). A polyp may be a round bump or a mushroom-shaped growth. You could have one polyp or several. Most colon polyps are noncancerous (benign). However, some colon polyps can become cancerous over time. What are the causes? The exact cause of colon polyps is not known. What increases the risk? This condition is more likely to develop in people who:  Have a family history of colon cancer or colon polyps.  Are older than 38 or older than 45 if they are African American.  Have inflammatory bowel disease, such as ulcerative colitis or Crohn disease.  Are overweight.  Smoke cigarettes.  Do not get enough exercise.  Drink too much alcohol.  Eat a diet that is: ? High in fat and red meat. ? Low in fiber.  Had childhood cancer that was treated with abdominal radiation.  What are the signs or symptoms? Most polyps do not cause  symptoms. If you have symptoms, they may include:  Blood coming from your rectum when having a bowel movement.  Blood in your stool.The stool may look dark red or black.  A change in bowel habits, such as constipation or diarrhea.  How is this diagnosed? This condition is diagnosed with a colonoscopy. This is a procedure that uses a lighted, flexible scope to look at the inside of your colon. How is this treated? Treatment for this condition involves removing any polyps that are found. Those polyps will  then be tested for cancer. If cancer is found, your health care provider will talk to you about options for colon cancer treatment. Follow these instructions at home: Diet  Eat plenty of fiber, such as fruits, vegetables, and whole grains.  Eat foods that are high in calcium and vitamin D, such as milk, cheese, yogurt, eggs, liver, fish, and broccoli.  Limit foods high in fat, red meats, and processed meats, such as hot dogs, sausage, bacon, and lunch meats.  Maintain a healthy weight, or lose weight if recommended by your health care provider. General instructions  Do not smoke cigarettes.  Do not drink alcohol excessively.  Keep all follow-up visits as told by your health care provider. This is important. This includes keeping regularly scheduled colonoscopies. Talk to your health care provider about when you need a colonoscopy.  Exercise every day or as told by your health care provider. Contact a health care provider if:  You have new or worsening bleeding during a bowel movement.  You have new or increased blood in your stool.  You have a change in bowel habits.  You unexpectedly lose weight. This information is not intended to replace advice given to you by your health care provider. Make sure you discuss any questions you have with your health care provider. Document Released: 05/07/2004 Document Revised: 01/17/2016 Document Reviewed: 07/02/2015 Elsevier Interactive Patient Education  2018 [place].    High-Fiber Diet Fiber, also called dietary fiber, is a type of carbohydrate found in fruits, vegetables, whole grains, and beans. A high-fiber diet can have many health benefits. Your health care provider may recommend a high-fiber diet to help:  Prevent constipation. Fiber can make your bowel movements more regular.  Lower your cholesterol.  Relieve hemorrhoids, uncomplicated diverticulosis, or irritable bowel syndrome.  Prevent overeating as part of a  weight-loss plan.  Prevent heart disease, type 2 diabetes, and certain cancers.  What is my plan? The recommended daily intake of fiber includes:  38 grams for men under age 9.  61 grams for men over age 75.  60 grams for women under age 72.  56 grams for women over age 22.  You can get the recommended daily intake of dietary fiber by eating a variety of fruits, vegetables, grains, and beans. Your health care provider may also recommend a fiber supplement if it is not possible to get enough fiber through your diet. What do I need to know about a high-fiber diet?  Fiber supplements have not been widely studied for their effectiveness, so it is better to get fiber through food sources.  Always check the fiber content on thenutrition facts label of any prepackaged food. Look for foods that contain at least 5 grams of fiber per serving.  Ask your dietitian if you have questions about specific foods that are related to your condition, especially if those foods are not listed in the following section.  Increase your daily fiber consumption gradually. Increasing your  intake of dietary fiber too quickly may cause bloating, cramping, or gas.  Drink plenty of water. Water helps you to digest fiber. What foods can I eat? Grains Whole-grain breads. Multigrain cereal. Oats and oatmeal. Brown rice. Barley. Bulgur wheat. [place]. Bran muffins. Popcorn. Rye wafer crackers. Vegetables Sweet potatoes. Spinach. Kale. Artichokes. Cabbage. Broccoli. Green peas. Carrots. Squash. Fruits Berries. Pears. Apples. Oranges. Avocados. Prunes and raisins. Dried figs. Meats and Other Protein Sources [place], kidney, pinto, and soy beans. Split peas. Lentils. Nuts and seeds. Dairy Fiber-fortified yogurt. Beverages Fiber-fortified soy milk. Fiber-fortified orange juice. Other Fiber bars. The items listed above may not be a complete list of recommended foods or beverages. Contact your dietitian for more  options. What foods are not recommended? Grains White bread. Pasta made with refined flour. White rice. Vegetables Fried potatoes. Canned vegetables. Well-cooked vegetables. Fruits Fruit juice. Cooked, strained fruit. Meats and Other Protein Sources Fatty cuts of meat. Fried Sales executive or fried fish. Dairy Milk. Yogurt. Cream cheese. Sour cream. Beverages Soft drinks. Other Cakes and pastries. Butter and oils. The items listed above may not be a complete list of foods and beverages to avoid. Contact your dietitian for more information. What are some tips for including high-fiber foods in my diet?  Eat a wide variety of high-fiber foods.  Make sure that half of all grains consumed each day are whole grains.  Replace breads and cereals made from refined flour or white flour with whole-grain breads and cereals.  Replace white rice with brown rice, bulgur wheat, or millet.  Start the day with a breakfast that is high in fiber, such as a cereal that contains at least 5 grams of fiber per serving.  Use beans in place of meat in soups, salads, or pasta.  Eat high-fiber snacks, such as berries, raw vegetables, nuts, or popcorn. This information is not intended to replace advice given to you by your health care provider. Make sure you discuss any questions you have with your health care provider. Document Released: 08/11/2005 Document Revised: 01/17/2016 Document Reviewed: 01/24/2014 Elsevier Interactive Patient Education  [employer].

## 2018-04-23 ENCOUNTER — Encounter (HOSPITAL_COMMUNITY): Payer: Self-pay | Admitting: Internal Medicine

## 2018-05-01 ENCOUNTER — Other Ambulatory Visit: Payer: Self-pay | Admitting: Family Medicine

## 2018-05-01 DIAGNOSIS — I10 Essential (primary) hypertension: Secondary | ICD-10-CM

## 2018-05-11 ENCOUNTER — Ambulatory Visit
Payer: Medicare HMO | Attending: Student in an Organized Health Care Education/Training Program | Admitting: Student in an Organized Health Care Education/Training Program

## 2018-05-11 ENCOUNTER — Encounter: Payer: Self-pay | Admitting: Student in an Organized Health Care Education/Training Program

## 2018-05-11 ENCOUNTER — Other Ambulatory Visit: Payer: Self-pay

## 2018-05-11 VITALS — BP 155/80 | HR 56 | Temp 98.0°F | Resp 16 | Ht 71.0 in | Wt 206.0 lb

## 2018-05-11 DIAGNOSIS — I10 Essential (primary) hypertension: Secondary | ICD-10-CM | POA: Insufficient documentation

## 2018-05-11 DIAGNOSIS — M19011 Primary osteoarthritis, right shoulder: Secondary | ICD-10-CM | POA: Diagnosis not present

## 2018-05-11 DIAGNOSIS — I2510 Atherosclerotic heart disease of native coronary artery without angina pectoris: Secondary | ICD-10-CM | POA: Insufficient documentation

## 2018-05-11 DIAGNOSIS — M19012 Primary osteoarthritis, left shoulder: Secondary | ICD-10-CM | POA: Diagnosis not present

## 2018-05-11 DIAGNOSIS — Z79899 Other long term (current) drug therapy: Secondary | ICD-10-CM | POA: Diagnosis not present

## 2018-05-11 DIAGNOSIS — M170 Bilateral primary osteoarthritis of knee: Secondary | ICD-10-CM | POA: Insufficient documentation

## 2018-05-11 DIAGNOSIS — Z79891 Long term (current) use of opiate analgesic: Secondary | ICD-10-CM | POA: Insufficient documentation

## 2018-05-11 DIAGNOSIS — Z7982 Long term (current) use of aspirin: Secondary | ICD-10-CM | POA: Insufficient documentation

## 2018-05-11 DIAGNOSIS — Z9049 Acquired absence of other specified parts of digestive tract: Secondary | ICD-10-CM | POA: Insufficient documentation

## 2018-05-11 DIAGNOSIS — M5136 Other intervertebral disc degeneration, lumbar region: Secondary | ICD-10-CM | POA: Diagnosis not present

## 2018-05-11 DIAGNOSIS — M47816 Spondylosis without myelopathy or radiculopathy, lumbar region: Secondary | ICD-10-CM | POA: Diagnosis not present

## 2018-05-11 DIAGNOSIS — G8929 Other chronic pain: Secondary | ICD-10-CM

## 2018-05-11 DIAGNOSIS — G894 Chronic pain syndrome: Secondary | ICD-10-CM | POA: Insufficient documentation

## 2018-05-11 DIAGNOSIS — M25512 Pain in left shoulder: Secondary | ICD-10-CM | POA: Diagnosis not present

## 2018-05-11 DIAGNOSIS — F17210 Nicotine dependence, cigarettes, uncomplicated: Secondary | ICD-10-CM | POA: Insufficient documentation

## 2018-05-11 DIAGNOSIS — Z951 Presence of aortocoronary bypass graft: Secondary | ICD-10-CM | POA: Diagnosis not present

## 2018-05-11 DIAGNOSIS — M545 Low back pain: Secondary | ICD-10-CM | POA: Diagnosis present

## 2018-05-11 DIAGNOSIS — Z8249 Family history of ischemic heart disease and other diseases of the circulatory system: Secondary | ICD-10-CM | POA: Insufficient documentation

## 2018-05-11 DIAGNOSIS — M25511 Pain in right shoulder: Secondary | ICD-10-CM | POA: Diagnosis not present

## 2018-05-11 DIAGNOSIS — E785 Hyperlipidemia, unspecified: Secondary | ICD-10-CM | POA: Diagnosis not present

## 2018-05-11 DIAGNOSIS — Z87442 Personal history of urinary calculi: Secondary | ICD-10-CM | POA: Insufficient documentation

## 2018-05-11 MED ORDER — OXYCODONE-ACETAMINOPHEN 10-325 MG PO TABS: 1.0000 | ORAL_TABLET | Freq: Three times a day (TID) | ORAL | 0 refills | Status: DC | PRN

## 2018-05-11 NOTE — Progress Notes (Signed)
Patient's Name: Jack [name]  MRN: [id number]  Referring Provider: Caren Macadam, MD  DOB: Jan 06, 1946  PCP: Caren Macadam, MD  DOS: 05/11/2018  Note by: [name], MD  Service setting: Ambulatory outpatient  Specialty: Interventional Pain Management  Location: [facility] (AMB) Pain Management Facility    Patient type: Established   Primary Reason(s) for Visit: Encounter for prescription drug management. (Level of risk: moderate)  CC: Back Pain (low)  HPI  Jack [name] is a 72 y.o. year old, male patient, who comes today for a medication management evaluation. He has Hyperlipidemia LDL goal <70; Tobacco abuse; Essential hypertension, benign; Coronary atherosclerosis of native coronary artery; Hypertension; Osteoarthritis of both shoulders; Lumbar spondylosis; Lumbar degenerative disc disease; Chronic pain of both shoulders; Chronic prescription benzodiazepine use; Chronic pain syndrome; Diarrhea; and Guaiac positive stools on their problem list. His primarily concern today is the Back Pain (low)  Pain Assessment: Location: Lower Back Radiating: sometimes goes down both legs Onset: More than a month ago Duration: Chronic pain Quality: Aching Severity: 0-No pain/10 (subjective, self-reported pain score)  Note: Reported level is compatible with observation.                         When using our objective Pain Scale, levels between 6 and 10/10 are said to belong in an emergency room, as it progressively worsens from a 6/10, described as severely limiting, requiring emergency care not usually available at an outpatient pain management facility. At a 6/10 level, communication becomes difficult and requires great effort. Assistance to reach the emergency department may be required. Facial flushing and profuse sweating along with potentially dangerous increases in heart rate and blood pressure will be evident. Effect on ADL: limits activities Timing: Intermittent(when I get up ion the m ornings) Modifying  factors: medications BP: (!) 155/80  HR: (!) 71  Jack [name] was last scheduled for an appointment on 02/11/2018 for medication management. During today's appointment we reviewed Jack [name]'s chronic pain status, as well as his outpatient medication regimen.  The patient  reports that he does not use drugs. His body mass index is 28.73 kg/m.  Further details on both, my assessment(s), as well as the proposed treatment plan, please see below.  Controlled Substance Pharmacotherapy Assessment REMS (Risk Evaluation and Mitigation Strategy)  Analgesic: Percocet 10 mg 3 times daily as needed, quantity 90 MME/day: 45 mg/day.  Jack Shorter, RN  05/11/2018  2:05 PM  Signed Nursing Pain Medication Assessment:  Safety precautions to be maintained throughout the outpatient stay will include: orient to surroundings, keep bed in low position, maintain call bell within reach at all times, provide assistance with transfer out of bed and ambulation.  Medication Inspection Compliance: Pill count conducted under aseptic conditions, in front of the patient. Neither the pills nor the bottle was removed from the patient's sight at any time. Once count was completed pills were immediately returned to the patient in their original bottle.  Medication: Oxycodone/APAP Pill/Patch Count: 51 of 90 pills remain Pill/Patch Appearance: Markings consistent with prescribed medication Bottle Appearance: Standard pharmacy container. Clearly labeled. Filled Date: 09 / 04 / 2019 Last Medication intake:  Today   Pharmacokinetics: Liberation and absorption (onset of action): WNL Distribution (time to peak effect): WNL Metabolism and excretion (duration of action): WNL         Pharmacodynamics: Desired effects: Analgesia: Jack [name] reports >50% benefit. Functional ability: Patient reports that medication allows him to accomplish basic ADLs Clinically  meaningful improvement in function (CMIF): Sustained CMIF goals  met Perceived effectiveness: Described as relatively effective, allowing for increase in activities of daily living (ADL) Undesirable effects: Side-effects or Adverse reactions: None reported Monitoring: [place] PMP: Online review of the past 42-monthperiod conducted. Compliant with practice rules and regulations Last UDS on record: Summary  Date Value Ref Range Status  06/09/2017 FINAL  Final    Comment:    ==================================================================== TOXASSURE COMP DRUG ANALYSIS,UR ==================================================================== Test                             Result       Flag       Units Drug Present and Declared for Prescription Verification   Trazodone                      PRESENT      EXPECTED   1,3 chlorophenyl piperazine    PRESENT      EXPECTED    1,3-chlorophenyl piperazine is an expected metabolite of    trazodone.   Acetaminophen                  PRESENT      EXPECTED   Salicylate                     PRESENT      EXPECTED Drug Present not Declared for Prescription Verification   Alprazolam                     18           UNEXPECTED ng/mg creat   Alpha-hydroxyalprazolam        32           UNEXPECTED ng/mg creat    Source of alprazolam is a scheduled prescription medication.    Alpha-hydroxyalprazolam is an expected metabolite of alprazolam.   Hydrocodone                    457          UNEXPECTED ng/mg creat   Dihydrocodeine                 250          UNEXPECTED ng/mg creat   Norhydrocodone                 659          UNEXPECTED ng/mg creat    Sources of hydrocodone include scheduled prescription    medications. Dihydrocodeine and norhydrocodone are expected    metabolites of hydrocodone. Dihydrocodeine is also available as a    scheduled prescription medication.   Venlafaxine                    PRESENT      UNEXPECTED   Desmethylvenlafaxine           PRESENT      UNEXPECTED    Desmethylvenlafaxine is an expected  metabolite of venlafaxine. Drug Absent but Declared for Prescription Verification   Oxycodone                      Not Detected UNEXPECTED ng/mg creat   Tizanidine                     Not Detected UNEXPECTED    Tizanidine, as indicated in  the declared medication list, is not    always detected even when used as directed. ==================================================================== Test                      Result    Flag   Units      Ref Range   Creatinine              180              mg/dL      >=20 ==================================================================== Declared Medications:  The flagging and interpretation on this report are based on the  following declared medications.  Unexpected results may arise from  inaccuracies in the declared medications.  **Note: The testing scope of this panel includes these medications:  Oxycodone (Percocet)  Trazodone  **Note: The testing scope of this panel does not include small to  moderate amounts of these reported medications:  Acetaminophen (Percocet)  Aspirin  Tizanidine  **Note: The testing scope of this panel does not include following  reported medications:  Amlodipine (Norvasc)  Carvedilol (Coreg)  Rosuvastatin (Crestor)  Vitamin D3 ==================================================================== For clinical consultation, please call [phone]. ====================================================================    UDS interpretation: Compliant          repeat today Medication Assessment Form: Reviewed. Patient indicates being compliant with therapy Treatment compliance: Compliant Risk Assessment Profile: Aberrant behavior: See prior evaluations. None observed or detected today Comorbid factors increasing risk of overdose: See prior notes. No additional risks detected today Opioid risk tool (ORT) (Total Score): 1 Personal History of Substance Abuse (SUD-Substance use disorder):  Alcohol: Negative   Illegal Drugs: Negative  Rx Drugs: Negative  ORT Risk Level calculation: Low Risk Risk of substance use disorder (SUD): Low Opioid Risk Tool - 05/11/18 1403      Family History of Substance Abuse   Alcohol  Negative    Illegal Drugs  Negative    Rx Drugs  Negative      Personal History of Substance Abuse   Alcohol  Negative    Illegal Drugs  Negative    Rx Drugs  Negative      Age   Age between 30-45 years   No      History of Preadolescent Sexual Abuse   History of Preadolescent Sexual Abuse  Negative or Male      Psychological Disease   Psychological Disease  Negative    Depression  Positive      Total Score   Opioid Risk Tool Scoring  1    Opioid Risk Interpretation  Low Risk      ORT Scoring interpretation table:  Score <3 = Low Risk for SUD  Score between 4-7 = Moderate Risk for SUD  Score >8 = High Risk for Opioid Abuse   Risk Mitigation Strategies:  Patient Counseling: Covered Patient-Prescriber Agreement (PPA): Present and active  Notification to other healthcare providers: Done  Pharmacologic Plan: No change in therapy, at this time.             Laboratory Chemistry  Inflammation Markers (CRP: Acute Phase) (ESR: Chronic Phase) No results found for: CRP, ESRSEDRATE, LATICACIDVEN                       Rheumatology Markers No results found for: RF, ANA, LABURIC, URICUR, LYMEIGGIGMAB, LYMEABIGMQN, HLAB27                      Renal Function Markers Lab Results  Component Value Date   BUN 13 01/23/2018   CREATININE 1.17 28/78/6767   BCR NOT APPLICABLE 20/94/7096   GFRAA >60 01/23/2018   GFRNONAA >60 01/23/2018                             Hepatic Function Markers Lab Results  Component Value Date   AST 14 (L) 01/23/2018   ALT 10 (L) 01/23/2018   ALBUMIN 3.0 (L) 01/23/2018   ALKPHOS 52 01/23/2018   LIPASE 20 01/23/2018                        Electrolytes Lab Results  Component Value Date   NA 140 01/23/2018   K 3.0 (L) 01/23/2018   CL 103  01/23/2018   CALCIUM 8.6 (L) 01/23/2018                        Neuropathy Markers Lab Results  Component Value Date   HGBA1C 5.2 05/06/2017                        CNS Tests No results found for: COLORCSF, APPEARCSF, RBCCOUNTCSF, WBCCSF, POLYSCSF, LYMPHSCSF, EOSCSF, PROTEINCSF, GLUCCSF, JCVIRUS, CSFOLI, IGGCSF                      Bone Pathology Markers Lab Results  Component Value Date   VD25OH 47 05/06/2017                         Coagulation Parameters Lab Results  Component Value Date   INR 1.06 01/23/2018   LABPROT 13.8 01/23/2018   PLT 135 (L) 01/23/2018                        Cardiovascular Markers Lab Results  Component Value Date   HGB 13.9 01/23/2018   HCT 42.1 01/23/2018                         CA Markers No results found for: CEA, CA125, LABCA2                      Note: Lab results reviewed.  Recent Diagnostic Imaging Results  CT Abdomen Pelvis W Contrast CLINICAL DATA:  Stomach pain and diarrhea for the last week. Lower abdominal pain.  History of coronary artery disease, hypertension, appendectomy, cholecystectomy, CABG.  EXAM: CT ABDOMEN AND PELVIS WITH CONTRAST  TECHNIQUE: Multidetector CT imaging of the abdomen and pelvis was performed using the standard protocol following bolus administration of intravenous contrast.  CONTRAST:  15m ISOVUE-300 IOPAMIDOL (ISOVUE-300) INJECTION 61%  COMPARISON:  CT abdomen dated 03/29/2010.  FINDINGS: Lower chest: No acute abnormality.  Hepatobiliary: No focal liver abnormality is seen. Status post cholecystectomy. No biliary dilatation.  Pancreas: Unremarkable. No pancreatic ductal dilatation or surrounding inflammatory changes.  Spleen: Normal in size without focal abnormality.  Adrenals/Urinary Tract: Adrenal glands appear normal. 11 mm nonobstructing LEFT renal stone. No suspicious mass or hydronephrosis bilaterally. No perinephric fluid. No ureteral or bladder calculi identified. Bladder  appears unremarkable, partially decompressed.  Stomach/Bowel: Bowel is normal in caliber. Extensive diverticulosis throughout the lower descending colon and upper sigmoid colon. No pericolonic fluid or inflammation to suggest acute diverticulitis. Thickening of the walls of the sigmoid and descending colon is likely  chronic and related to the underlying diverticulosis.  Stomach is unremarkable, partially decompressed. No small bowel wall thickening or evidence of acute bowel wall inflammation.  Vascular/Lymphatic: Extensive aortic atherosclerosis. 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  No acute appearing vascular abnormality.  Reproductive: Prostate is unremarkable.  Other: No free fluid or abscess collection. No free intraperitoneal air.  Musculoskeletal: Advanced degenerative changes throughout the slightly scoliotic thoracolumbar spine. No acute or suspicious osseous finding. Superficial soft tissues are unremarkable.  IMPRESSION: 1. No acute findings. No bowel obstruction or evidence of acute bowel wall inflammation. No free fluid or abscess collection. No free intraperitoneal air. No evidence of acute solid organ abnormality. 2. Colonic diverticulosis without evidence of acute diverticulitis. 3. Aortic atherosclerosis. 4. 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 Recommend followup by ultrasound in 3 years. This recommendation follows ACR consensus guidelines: White Paper of the ACR Incidental Findings Committee II on Vascular Findings. J Am Coll Radiol 2013; 09:[id number] 5. 11 mm nonobstructing LEFT renal stone.  Electronically Signed   By: Franki Cabot M.D.   On: 01/23/2018 10:28  Complexity Note: Imaging results reviewed. Results shared with Mr. Tompson, using Layman's terms.                         Meds   Current Outpatient Medications:  .  amLODipine (NORVASC) 5 MG tablet, TAKE ONE TABLET  BY MOUTH EVERY *OTHER* DAY, Disp: 90 tablet, Rfl: 0 .  aspirin EC 81 MG tablet, Take 1 tablet (81 mg total) by mouth daily., Disp: 90 tablet, Rfl: 3 .  carvedilol (COREG) 6.25 MG tablet, TAKE TWO (2) TABLETS BY MOUTH EVERY *OTHER* DAY (Patient taking differently: Take 12.5 mg by mouth every other day. ), Disp: 90 tablet, Rfl: 1 .  Cholecalciferol (VITAMIN D3) 1000 units CAPS, Take 1,000 Units by mouth daily., Disp: , Rfl:  .  citalopram (CELEXA) 40 MG tablet, Take 1 tablet (40 mg total) by mouth daily., Disp: 90 tablet, Rfl: 1 .  oxyCODONE-acetaminophen (PERCOCET) 10-325 MG tablet, Take 1 tablet by mouth every 8 (eight) hours as needed for pain., Disp: 90 tablet, Rfl: 0 .  polyethylene glycol (MIRALAX / GLYCOLAX) packet, Take 17 g by mouth daily. , Disp: , Rfl:  .  traZODone (DESYREL) 100 MG tablet, Take 100 mg by mouth at bedtime., Disp: , Rfl: 1  ROS  Constitutional: Denies any fever or chills Gastrointestinal: No reported hemesis, hematochezia, vomiting, or acute GI distress Musculoskeletal: Denies any acute onset joint swelling, redness, loss of ROM, or weakness Neurological: No reported episodes of acute onset apraxia, aphasia, dysarthria, agnosia, amnesia, paralysis, loss of coordination, or loss of consciousness  Allergies  Mr. Monterroso has No Known Allergies.  [place]  Drug: Mr. Ressler  reports that he does not use drugs. Alcohol:  reports that he drinks alcohol. Tobacco:  reports that he has been smoking cigarettes. He has a 57.00 pack-year smoking history. He has never used smokeless tobacco. Medical:  has a past medical history of Coronary atherosclerosis of native coronary artery, Dyspnea, History of kidney stones, HLD (hyperlipidemia), Hypertension, and Tobacco user. Surgical: Mr. Nedeau  has a past surgical history that includes Foot surgery; Appendectomy; Knee Arthroplasty; Coronary artery bypass graft (2008); Rotator cuff repair; Shoulder surgery; Cholecystectomy (2003);  Colonoscopy (N/A, 04/21/2018); and polypectomy (04/21/2018). Family: family history includes Cerebral aneurysm in his father; Heart attack in his mother.  Constitutional Exam  General  appearance: Well nourished, well developed, and well hydrated. In no apparent acute distress Vitals:   05/11/18 1357 05/11/18 1400  BP:  (!) 155/80  Pulse: (!) 56   Resp: 16   Temp: 98 F (36.7 C)   SpO2: 98%   Weight: 206 lb (93.4 kg)   Height: _0  (1.803 m)    BMI Assessment: Estimated body mass index is 28.73 kg/m as calculated from the following:   Height as of this encounter: _1  (1.803 m).   Weight as of this encounter: 206 lb (93.4 kg).  BMI interpretation table: BMI level Category Range association with higher incidence of chronic pain  <18 kg/m2 Underweight   18.5-24.9 kg/m2 Ideal body weight   25-29.9 kg/m2 Overweight Increased incidence by 20%  30-34.9 kg/m2 Obese (Class I) Increased incidence by 68%  35-39.9 kg/m2 Severe obesity (Class II) Increased incidence by 136%  >40 kg/m2 Extreme obesity (Class III) Increased incidence by 254%   Patient's current BMI Ideal Body weight  Body mass index is 28.73 kg/m. Ideal body weight: 75.3 kg (166 lb 0.1 oz) Adjusted ideal body weight: 82.6 kg (182 lb 0.1 oz)   BMI Readings from Last 4 Encounters:  05/11/18 28.73 kg/m  04/21/18 29.56 kg/m  02/18/18 29.57 kg/m  02/11/18 29.70 kg/m   Wt Readings from Last 4 Encounters:  05/11/18 206 lb (93.4 kg)  04/21/18 206 lb (93.4 kg)  02/18/18 206 lb 1.3 oz (93.5 kg)  02/11/18 207 lb (93.9 kg)  Psych/Mental status: Alert, oriented x 3 (person, place, & time)       Eyes: PERLA Respiratory: No evidence of acute respiratory distress  Cervical Spine Area Exam  Skin & Axial Inspection: No masses, redness, edema, swelling, or associated skin lesions Alignment: Symmetrical Functional ROM: Unrestricted ROM      Stability: No instability detected Muscle Tone/Strength: Functionally intact. No  obvious neuro-muscular anomalies detected. Sensory (Neurological): Unimpaired Palpation: No palpable anomalies              Upper Extremity (UE) Exam    Side: Right upper extremity  Side: Left upper extremity  Skin & Extremity Inspection: Skin color, temperature, and hair growth are WNL. No peripheral edema or cyanosis. No masses, redness, swelling, asymmetry, or associated skin lesions. No contractures.  Skin & Extremity Inspection: Skin color, temperature, and hair growth are WNL. No peripheral edema or cyanosis. No masses, redness, swelling, asymmetry, or associated skin lesions. No contractures.  Functional ROM: Unrestricted ROM          Functional ROM: Unrestricted ROM          Muscle Tone/Strength: Functionally intact. No obvious neuro-muscular anomalies detected.  Muscle Tone/Strength: Functionally intact. No obvious neuro-muscular anomalies detected.  Sensory (Neurological): Unimpaired          Sensory (Neurological): Unimpaired          Palpation: No palpable anomalies              Palpation: No palpable anomalies              Provocative Test(s):  Phalen's test: deferred Tinel's test: deferred Apley's scratch test (touch opposite shoulder):  Action 1 (Across chest): deferred Action 2 (Overhead): deferred Action 3 (LB reach): deferred   Provocative Test(s):  Phalen's test: deferred Tinel's test: deferred Apley's scratch test (touch opposite shoulder):  Action 1 (Across chest): deferred Action 2 (Overhead): deferred Action 3 (LB reach): deferred    Thoracic Spine Area Exam  Skin & Axial Inspection: No  masses, redness, or swelling Alignment: Symmetrical Functional ROM: Unrestricted ROM Stability: No instability detected Muscle Tone/Strength: Functionally intact. No obvious neuro-muscular anomalies detected. Sensory (Neurological): Unimpaired Muscle strength & Tone: No palpable anomalies   Lumbar Spine Area Exam  Skin & Axial Inspection: No masses, redness, or  swelling Alignment: Symmetrical Functional ROM: Decreased ROM affecting both sides Stability: No instability detected Muscle Tone/Strength: Functionally intact. No obvious neuro-muscular anomalies detected. Sensory (Neurological): Musculoskeletal pain pattern Palpation: No palpable anomalies       Provocative Tests: Lumbar Hyperextension/rotation test: (+) bilaterally for facet joint pain. Lumbar quadrant test (Kemp's test): deferred today       Lumbar Lateral bending test: deferred today       Patrick's Maneuver: deferred today                   FABER test: deferred today       Thigh-thrust test: deferred today       S-I compression test: deferred today       S-I distraction test: deferred today        Gait & Posture Assessment  Ambulation: Limited Gait: Antalgic   Lower Extremity Exam    Side: Right lower extremity  Side: Left lower extremity  Stability: No instability observed          Stability: No instability observed          Skin & Extremity Inspection: Skin color, temperature, and hair growth are WNL. No peripheral edema or cyanosis. No masses, redness, swelling, asymmetry, or associated skin lesions. No contractures.  Skin & Extremity Inspection: Skin color, temperature, and hair growth are WNL. No peripheral edema or cyanosis. No masses, redness, swelling, asymmetry, or associated skin lesions. No contractures.  Functional ROM: Unrestricted ROM                  Functional ROM: Unrestricted ROM                  Muscle Tone/Strength: Functionally intact. No obvious neuro-muscular anomalies detected.  Muscle Tone/Strength: Functionally intact. No obvious neuro-muscular anomalies detected.  Sensory (Neurological): Unimpaired  Sensory (Neurological): Unimpaired  Palpation: No palpable anomalies  Palpation: No palpable anomalies   Assessment  Primary Diagnosis & Pertinent Problem List: The primary encounter diagnosis was Primary osteoarthritis of both knees. Diagnoses of  Chronic pain of both shoulders, Lumbar spondylosis, Lumbar degenerative disc disease, and Osteoarthritis of both shoulders, unspecified osteoarthritis type were also pertinent to this visit.  Status Diagnosis  Controlled Controlled Controlled 1. Primary osteoarthritis of both knees   2. Chronic pain of both shoulders   3. Lumbar spondylosis   4. Lumbar degenerative disc disease   5. Osteoarthritis of both shoulders, unspecified osteoarthritis type      General Recommendations: The pain condition that the patient suffers from is best treated with a multidisciplinary approach that involves an increase in physical activity to prevent de-conditioning and worsening of the pain cycle, as well as psychological counseling (formal and/or informal) to address the co-morbid psychological affects of pain. Treatment will often involve judicious use of pain medications and interventional procedures to decrease the pain, allowing the patient to participate in the physical activity that will ultimately produce long-lasting pain reductions. The goal of the multidisciplinary approach is to return the patient to a higher level of overall function and to restore their ability to perform activities of daily living.  72 year old male who presents with bilateral shoulder pain (history of left shoulder rotator cuff repair,  told that he needs to have his right shoulder surgically repaired but wants to avoid), bilateral knee pain secondary to advanced osteoarthritis, and axial low back pain secondary to lumbar degenerative disc disease and lumbar spondylosis.Patient is status post a series of 3 intra-articular Hyalgan knee injections bilaterally along with a right posterior shoulder joint injection with steroid.  Patient presents for follow-up today. He states that he is doing well on his current medication regimen. He is taking his medications as prescribed and is been compliant with therapy. He states that his  rightshoulder is also doing better. He is able to do more throughout the day and is endorsing improvement in his functional status.  Repeat UDS today, refill as below.  PMP checked and appropriate.  Plan of Care  Pharmacotherapy (Medications Ordered): Meds ordered this encounter  Medications  . DISCONTD: oxyCODONE-acetaminophen (PERCOCET) 10-325 MG tablet    Sig: Take 1 tablet by mouth every 8 (eight) hours as needed for pain.    Dispense:  90 tablet    Refill:  0    Do not place this medication, or any other prescription from our practice, on "Automatic Refill". Patient may have prescription filled one day early if pharmacy is closed on scheduled refill date. Fill on or after: 05/27/2018, 06/26/2018, 07/25/2018 For chronic pain To last 30 days from fill date  . DISCONTD: oxyCODONE-acetaminophen (PERCOCET) 10-325 MG tablet    Sig: Take 1 tablet by mouth every 8 (eight) hours as needed for pain.    Dispense:  90 tablet    Refill:  0    Do not place this medication, or any other prescription from our practice, on "Automatic Refill". Patient may have prescription filled one day early if pharmacy is closed on scheduled refill date. Fill on or after: 05/27/2018, 06/26/2018, 07/25/2018 For chronic pain To last 30 days from fill date  . oxyCODONE-acetaminophen (PERCOCET) 10-325 MG tablet    Sig: Take 1 tablet by mouth every 8 (eight) hours as needed for pain.    Dispense:  90 tablet    Refill:  0    Do not place this medication, or any other prescription from our practice, on "Automatic Refill". Patient may have prescription filled one day early if pharmacy is closed on scheduled refill date. Fill on or after: 05/27/2018, 06/26/2018, 07/25/2018 For chronic pain To last 30 days from fill date   Lab-work, procedure(s), and/or referral(s): Orders Placed This Encounter  Procedures  . ToxASSURE Select 13 (MW), Urine    Considering:   -Lumbar facet medial branch nerve blocks for lumbar  spondylosis -Bilateral SI joint injection -Bilateral genicular nerve block -Rshoulder injection   PRN Procedures:   To be determined at a later time   Time Note: Greater than 50% of the 25 minute(s) of face-to-face time spent with Mr. Degrazia, was spent in counseling/coordination of care regarding: Mr. Fildes primary cause of pain, the treatment plan, treatment alternatives, the opioid analgesic risks and possible complications, the appropriate use of his medications, realistic expectations, the goals of pain management (increased in functionality), the medication agreement and the patient's responsibilities when it comes to controlled substances.  Provider-requested follow-up: Return in about 3 months (around 08/10/2018) for MM with Crystal.  Future Appointments  Date Time Provider [place]  05/18/2018 11:00 AM Caren Macadam, MD [facility]    Primary Care Physician: Caren Macadam, MD Location: [facility] Note by: [name], M.D Date: 05/11/2018; Time: 2:29 PM  Patient Instructions  You  have been given 3 prescriptions for Oxycodone to last until 08/25/2018.

## 2018-05-11 NOTE — Patient Instructions (Signed)
You have been given 3 prescriptions for Oxycodone to last until 08/25/2018.

## 2018-05-11 NOTE — Progress Notes (Signed)
Nursing Pain Medication Assessment:  Safety precautions to be maintained throughout the outpatient stay will include: orient to surroundings, keep bed in low position, maintain call bell within reach at all times, provide assistance with transfer out of bed and ambulation.  Medication Inspection Compliance: Pill count conducted under aseptic conditions, in front of the patient. Neither the pills nor the bottle was removed from the patient's sight at any time. Once count was completed pills were immediately returned to the patient in their original bottle.  Medication: Oxycodone/APAP Pill/Patch Count: 51 of 90 pills remain Pill/Patch Appearance: Markings consistent with prescribed medication Bottle Appearance: Standard pharmacy container. Clearly labeled. Filled Date: 09 / 04 / 2019 Last Medication intake:  Today

## 2018-05-18 ENCOUNTER — Ambulatory Visit: Payer: Medicare HMO | Admitting: Family Medicine

## 2018-06-01 ENCOUNTER — Other Ambulatory Visit: Payer: Self-pay | Admitting: Family Medicine

## 2018-06-01 DIAGNOSIS — E782 Mixed hyperlipidemia: Secondary | ICD-10-CM

## 2018-08-02 ENCOUNTER — Other Ambulatory Visit: Payer: Self-pay

## 2018-08-02 ENCOUNTER — Encounter: Payer: Self-pay | Admitting: Nurse Practitioner

## 2018-08-02 ENCOUNTER — Ambulatory Visit: Payer: Medicare HMO | Attending: Nurse Practitioner | Admitting: Nurse Practitioner

## 2018-08-02 VITALS — BP 133/74 | HR 52 | Temp 97.5°F | Resp 18 | Ht 71.0 in | Wt 189.2 lb

## 2018-08-02 DIAGNOSIS — I10 Essential (primary) hypertension: Secondary | ICD-10-CM | POA: Diagnosis not present

## 2018-08-02 DIAGNOSIS — M19011 Primary osteoarthritis, right shoulder: Secondary | ICD-10-CM | POA: Insufficient documentation

## 2018-08-02 DIAGNOSIS — I2510 Atherosclerotic heart disease of native coronary artery without angina pectoris: Secondary | ICD-10-CM | POA: Diagnosis not present

## 2018-08-02 DIAGNOSIS — F17210 Nicotine dependence, cigarettes, uncomplicated: Secondary | ICD-10-CM | POA: Insufficient documentation

## 2018-08-02 DIAGNOSIS — Z951 Presence of aortocoronary bypass graft: Secondary | ICD-10-CM | POA: Insufficient documentation

## 2018-08-02 DIAGNOSIS — M47816 Spondylosis without myelopathy or radiculopathy, lumbar region: Secondary | ICD-10-CM | POA: Diagnosis not present

## 2018-08-02 DIAGNOSIS — R197 Diarrhea, unspecified: Secondary | ICD-10-CM | POA: Insufficient documentation

## 2018-08-02 DIAGNOSIS — M19012 Primary osteoarthritis, left shoulder: Secondary | ICD-10-CM | POA: Insufficient documentation

## 2018-08-02 DIAGNOSIS — Z79899 Other long term (current) drug therapy: Secondary | ICD-10-CM | POA: Insufficient documentation

## 2018-08-02 DIAGNOSIS — G894 Chronic pain syndrome: Secondary | ICD-10-CM

## 2018-08-02 DIAGNOSIS — Z79891 Long term (current) use of opiate analgesic: Secondary | ICD-10-CM | POA: Insufficient documentation

## 2018-08-02 DIAGNOSIS — M5136 Other intervertebral disc degeneration, lumbar region: Secondary | ICD-10-CM | POA: Insufficient documentation

## 2018-08-02 DIAGNOSIS — M25561 Pain in right knee: Secondary | ICD-10-CM | POA: Insufficient documentation

## 2018-08-02 DIAGNOSIS — I700 Atherosclerosis of aorta: Secondary | ICD-10-CM | POA: Diagnosis not present

## 2018-08-02 DIAGNOSIS — K5730 Diverticulosis of large intestine without perforation or abscess without bleeding: Secondary | ICD-10-CM | POA: Diagnosis not present

## 2018-08-02 DIAGNOSIS — M25562 Pain in left knee: Secondary | ICD-10-CM | POA: Diagnosis present

## 2018-08-02 DIAGNOSIS — E785 Hyperlipidemia, unspecified: Secondary | ICD-10-CM | POA: Diagnosis not present

## 2018-08-02 DIAGNOSIS — M25511 Pain in right shoulder: Secondary | ICD-10-CM | POA: Diagnosis present

## 2018-08-02 MED ORDER — OXYCODONE-ACETAMINOPHEN 10-325 MG PO TABS: 1.0000 | ORAL_TABLET | Freq: Three times a day (TID) | ORAL | 0 refills | Status: DC | PRN

## 2018-08-02 NOTE — Progress Notes (Signed)
Patient's Name: Jack [name]  MRN: [id number]  Referring Provider: Caren Macadam, MD  DOB: 11-30-1945  PCP: Patient, No Pcp Per  DOS: 08/02/2018  Note by: Vevelyn Francois NP  Service setting: Ambulatory outpatient  Specialty: Interventional Pain Management  Location: [facility] (AMB) Pain Management Facility    Patient type: Established    Primary Reason(s) for Visit: Encounter for prescription drug management. (Level of risk: moderate)  CC: Shoulder Pain (right) and Knee Pain (bilaterally)  HPI  Jack [name] is a 72 y.o. year old, male patient, who comes today for a medication management evaluation. He has Hyperlipidemia LDL goal <70; Tobacco abuse; Essential hypertension, benign; Coronary atherosclerosis of native coronary artery; Hypertension; Osteoarthritis of both shoulders; Lumbar spondylosis; Lumbar degenerative disc disease; Chronic pain of both shoulders; Chronic prescription benzodiazepine use; Chronic pain syndrome; Diarrhea; and Guaiac positive stools on their problem list. His primarily concern today is the Shoulder Pain (right) and Knee Pain (bilaterally)  Pain Assessment: Location: Right Shoulder Radiating: denies Onset: More than a month ago Duration: Chronic pain Quality: Aching, Constant(worse in the morning) Severity: 7 /10 (subjective, self-reported pain score)  Note: Reported level is compatible with observation. Clinically the patient looks like a 0/10       Information on the proper use of the pain scale provided to the patient today. When using our objective Pain Scale, levels between 6 and 10/10 are said to belong in an emergency room, as it progressively worsens from a 6/10, described as severely limiting, requiring emergency care not usually available at an outpatient pain management facility. At a 6/10 level, communication becomes difficult and requires great effort. Assistance to reach the emergency department may be required. Facial flushing and profuse sweating along with  potentially dangerous increases in heart rate and blood pressure will be evident. Effect on ADL: limits daily activities Timing: Constant Modifying factors: meds BP: 133/74  HR: (!) 52  Jack [name] was last scheduled for an appointment on Visit date not found for medication management. During today's appointment we reviewed Jack [name]'s chronic pain status, as well as his outpatient medication regimen. his pain is worse in his back and his stomach.  He admits that he does have some constipation uses MiraLAX occasionally.  He admits that his back pain occurs with standing for long periods washing dishes or do laundry.  The patient  reports that he does not use drugs. His body mass index is 26.39 kg/m.  Further details on both, my assessment(s), as well as the proposed treatment plan, please see below.  Controlled Substance Pharmacotherapy Assessment REMS (Risk Evaluation and Mitigation Strategy)  Analgesic:Percocet 10 mg 3 times daily as needed, quantity 90 MME/day:'45mg'$ /day.  Rise Patience, RN  08/02/2018  2:05 PM  Signed Nursing Pain Medication Assessment:  Safety precautions to be maintained throughout the outpatient stay will include: orient to surroundings, keep bed in low position, maintain call bell within reach at all times, provide assistance with transfer out of bed and ambulation.  Medication Inspection Compliance: Pill count conducted under aseptic conditions, in front of the patient. Neither the pills nor the bottle was removed from the patient's sight at any time. Once count was completed pills were immediately returned to the patient in their original bottle.  Medication: Oxycodone/APAP Pill/Patch Count: 71 of 90 pills remain Pill/Patch Appearance: Markings consistent with prescribed medication Bottle Appearance: Standard pharmacy container. Clearly labeled. Filled Date: 74 / 02 / 2019 Last Medication intake:  Today   Pharmacokinetics: Liberation and absorption (onset  of action): WNL Distribution (time to peak effect): WNL Metabolism and excretion (duration of action): WNL         Pharmacodynamics: Desired effects: Analgesia: Jack [name] reports >50% benefit. Functional ability: Patient reports that medication allows him to accomplish basic ADLs Clinically meaningful improvement in function (CMIF): Sustained CMIF goals met Perceived effectiveness: Described as relatively effective, allowing for increase in activities of daily living (ADL) Undesirable effects: Side-effects or Adverse reactions: None reported Monitoring: [place] PMP: Online review of the past 16-monthperiod conducted. Compliant with practice rules and regulations Last UDS on record: Summary  Date Value Ref Range Status  06/09/2017 FINAL  Final    Comment:    ==================================================================== TOXAS[facility]RE COMP DRUG ANALYSIS,UR ==================================================================== Test                             Result       Flag       Units Drug Present and Declared for Prescription Verification   Trazodone                      PRESENT      EXPECTED   1,3 chlorophenyl piperazine    PRESENT      EXPECTED    1,3-chlorophenyl piperazine is an expected metabolite of    trazodone.   Acetaminophen                  PRESENT      EXPECTED   Salicylate                     PRESENT      EXPECTED Drug Present not Declared for Prescription Verification   Alprazolam                     18           UNEXPECTED ng/mg creat   Alpha-hydroxyalprazolam        32           UNEXPECTED ng/mg creat    Source of alprazolam is a scheduled prescription medication.    Alpha-hydroxyalprazolam is an expected metabolite of alprazolam.   Hydrocodone                    457          UNEXPECTED ng/mg creat   Dihydrocodeine                 250          UNEXPECTED ng/mg creat   Norhydrocodone                 659          UNEXPECTED ng/mg creat    Sources of hydrocodone  include scheduled prescription    medications. Dihydrocodeine and norhydrocodone are expected    metabolites of hydrocodone. Dihydrocodeine is also available as a    scheduled prescription medication.   Venlafaxine                    PRESENT      UNEXPECTED   Desmethylvenlafaxine           PRESENT      UNEXPECTED    Desmethylvenlafaxine is an expected metabolite of venlafaxine. Drug Absent but Declared for Prescription Verification   Oxycodone  Not Detected UNEXPECTED ng/mg creat   Tizanidine                     Not Detected UNEXPECTED    Tizanidine, as indicated in the declared medication list, is not    always detected even when used as directed. ==================================================================== Test                      Result    Flag   Units      Ref Range   Creatinine              180              mg/dL      >=20 ==================================================================== Declared Medications:  The flagging and interpretation on this report are based on the  following declared medications.  Unexpected results may arise from  inaccuracies in the declared medications.  **Note: The testing scope of this panel includes these medications:  Oxycodone (Percocet)  Trazodone  **Note: The testing scope of this panel does not include small to  moderate amounts of these reported medications:  Acetaminophen (Percocet)  Aspirin  Tizanidine  **Note: The testing scope of this panel does not include following  reported medications:  Amlodipine (Norvasc)  Carvedilol (Coreg)  Rosuvastatin (Crestor)  Vitamin D3 ==================================================================== For clinical consultation, please call [phone]. ====================================================================    UDS interpretation: Compliant          Medication Assessment Form: Reviewed. Patient indicates being compliant with therapy Treatment  compliance: Compliant Risk Assessment Profile: Aberrant behavior: See prior evaluations. None observed or detected today Comorbid factors increasing risk of overdose: See prior notes. No additional risks detected today Opioid risk tool (ORT) (Total Score): 0 Personal History of Substance Abuse (SUD-Substance use disorder):  Alcohol: Negative  Illegal Drugs: Negative  Rx Drugs: Negative  ORT Risk Level calculation: Low Risk Risk of substance use disorder (SUD): Low Opioid Risk Tool - 08/02/18 1400      Family History of Substance Abuse   Alcohol  Negative    Illegal Drugs  Negative    Rx Drugs  Negative      Personal History of Substance Abuse   Alcohol  Negative    Illegal Drugs  Negative    Rx Drugs  Negative      Age   Age between 71-45 years   No      History of Preadolescent Sexual Abuse   History of Preadolescent Sexual Abuse  Negative or Male      Psychological Disease   Psychological Disease  Negative    Depression  Negative      Total Score   Opioid Risk Tool Scoring  0    Opioid Risk Interpretation  Low Risk      ORT Scoring interpretation table:  Score <3 = Low Risk for SUD  Score between 4-7 = Moderate Risk for SUD  Score >8 = High Risk for Opioid Abuse   Risk Mitigation Strategies:  Patient Counseling: Covered Patient-Prescriber Agreement (PPA): Present and active  Notification to other healthcare providers: Done  Pharmacologic Plan: No change in therapy, at this time.             Laboratory Chemistry  Inflammation Markers (CRP: Acute Phase) (ESR: Chronic Phase) No results found for: CRP, ESRSEDRATE, LATICACIDVEN                       Rheumatology Markers No  results found for: RF, ANA, LABURIC, URICUR, LYMEIGGIGMAB, LYMEABIGMQN, HLAB27                      Renal Function Markers Lab Results  Component Value Date   BUN 13 01/23/2018   CREATININE 1.17 24/23/5361   BCR NOT APPLICABLE 44/31/5400   GFRAA >60 01/23/2018   GFRNONAA >60 01/23/2018                              Hepatic Function Markers Lab Results  Component Value Date   AST 14 (L) 01/23/2018   ALT 10 (L) 01/23/2018   ALBUMIN 3.0 (L) 01/23/2018   ALKPHOS 52 01/23/2018   LIPASE 20 01/23/2018                        Electrolytes Lab Results  Component Value Date   NA 140 01/23/2018   K 3.0 (L) 01/23/2018   CL 103 01/23/2018   CALCIUM 8.6 (L) 01/23/2018                        Neuropathy Markers Lab Results  Component Value Date   HGBA1C 5.2 05/06/2017                        CNS Tests No results found for: COLORCSF, APPEARCSF, RBCCOUNTCSF, WBCCSF, POLYSCSF, LYMPHSCSF, EOSCSF, PROTEINCSF, GLUCCSF, JCVIRUS, CSFOLI, IGGCSF                      Bone Pathology Markers Lab Results  Component Value Date   VD25OH 47 05/06/2017                         Coagulation Parameters Lab Results  Component Value Date   INR 1.06 01/23/2018   LABPROT 13.8 01/23/2018   PLT 135 (L) 01/23/2018                        Cardiovascular Markers Lab Results  Component Value Date   HGB 13.9 01/23/2018   HCT 42.1 01/23/2018                         CA Markers No results found for: CEA, CA125, LABCA2                      Note: Lab results reviewed.  Recent Diagnostic Imaging Results  CT Abdomen Pelvis W Contrast CLINICAL DATA:  Stomach pain and diarrhea for the last week. Lower abdominal pain.  History of coronary artery disease, hypertension, appendectomy, cholecystectomy, CABG.  EXAM: CT ABDOMEN AND PELVIS WITH CONTRAST  TECHNIQUE: Multidetector CT imaging of the abdomen and pelvis was performed using the standard protocol following bolus administration of intravenous contrast.  CONTRAST:  190m ISOVUE-300 IOPAMIDOL (ISOVUE-300) INJECTION 61%  COMPARISON:  CT abdomen dated 03/29/2010.  FINDINGS: Lower chest: No acute abnormality.  Hepatobiliary: No focal liver abnormality is seen. Status post cholecystectomy. No biliary dilatation.  Pancreas:  Unremarkable. No pancreatic ductal dilatation or surrounding inflammatory changes.  Spleen: Normal in size without focal abnormality.  Adrenals/Urinary Tract: Adrenal glands appear normal. 11 mm nonobstructing LEFT renal stone. No suspicious mass or hydronephrosis bilaterally. No perinephric fluid. No ureteral or bladder calculi identified. Bladder appears unremarkable, partially decompressed.  Stomach/Bowel:  Bowel is normal in caliber. Extensive diverticulosis throughout the lower descending colon and upper sigmoid colon. No pericolonic fluid or inflammation to suggest acute diverticulitis. Thickening of the walls of the sigmoid and descending colon is likely chronic and related to the underlying diverticulosis.  Stomach is unremarkable, partially decompressed. No small bowel wall thickening or evidence of acute bowel wall inflammation.  Vascular/Lymphatic: Extensive aortic atherosclerosis. 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  No acute appearing vascular abnormality.  Reproductive: Prostate is unremarkable.  Other: No free fluid or abscess collection. No free intraperitoneal air.  Musculoskeletal: Advanced degenerative changes throughout the slightly scoliotic thoracolumbar spine. No acute or suspicious osseous finding. Superficial soft tissues are unremarkable.  IMPRESSION: 1. No acute findings. No bowel obstruction or evidence of acute bowel wall inflammation. No free fluid or abscess collection. No free intraperitoneal air. No evidence of acute solid organ abnormality. 2. Colonic diverticulosis without evidence of acute diverticulitis. 3. Aortic atherosclerosis. 4. 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 Recommend followup by ultrasound in 3 years. This recommendation follows ACR consensus guidelines: White Paper of the ACR Incidental Findings Committee II on Vascular Findings. J Am Coll Radiol  2013; 96:283-662 5. 11 mm nonobstructing LEFT renal stone.  Electronically Signed   By: Franki Cabot M.D.   On: 01/23/2018 10:28  Complexity Note: Imaging results reviewed. Results shared with Mr. Lucus, using Layman's terms.                         Meds   Current Outpatient Medications:  .  amLODipine (NORVASC) 5 MG tablet, TAKE ONE TABLET BY MOUTH EVERY *OTHER* DAY, Disp: 90 tablet, Rfl: 0 .  aspirin EC 81 MG tablet, Take 1 tablet (81 mg total) by mouth daily., Disp: 90 tablet, Rfl: 3 .  carvedilol (COREG) 6.25 MG tablet, TAKE TWO (2) TABLETS BY MOUTH EVERY *OTHER* DAY (Patient taking differently: Take 12.5 mg by mouth every other day. ), Disp: 90 tablet, Rfl: 1 .  Cholecalciferol (VITAMIN D3) 1000 units CAPS, Take 1,000 Units by mouth daily., Disp: , Rfl:  .  citalopram (CELEXA) 40 MG tablet, Take 1 tablet (40 mg total) by mouth daily., Disp: 90 tablet, Rfl: 1 .  [START ON 10/24/2018] oxyCODONE-acetaminophen (PERCOCET) 10-325 MG tablet, Take 1 tablet by mouth every 8 (eight) hours as needed for pain., Disp: 90 tablet, Rfl: 0 .  polyethylene glycol (MIRALAX / GLYCOLAX) packet, Take 17 g by mouth daily. , Disp: , Rfl:  .  [START ON 09/24/2018] oxyCODONE-acetaminophen (PERCOCET) 10-325 MG tablet, Take 1 tablet by mouth every 8 (eight) hours as needed for pain., Disp: 90 tablet, Rfl: 0 .  [START ON 08/25/2018] oxyCODONE-acetaminophen (PERCOCET) 10-325 MG tablet, Take 1 tablet by mouth every 8 (eight) hours as needed for pain., Disp: 90 tablet, Rfl: 0  ROS  Constitutional: Denies any fever or chills Gastrointestinal: No reported hemesis, hematochezia, vomiting, or acute GI distress Musculoskeletal: Denies any acute onset joint swelling, redness, loss of ROM, or weakness Neurological: No reported episodes of acute onset apraxia, aphasia, dysarthria, agnosia, amnesia, paralysis, loss of coordination, or loss of consciousness  Allergies  Mr. Diprima has No Known Allergies.  [place]  Drug: Mr.  Stgermaine  reports that he does not use drugs. Alcohol:  reports that he drinks alcohol. Tobacco:  reports that he has been smoking cigarettes. He has a 57.00 pack-year smoking history. He has never used smokeless tobacco.  Medical:  has a past medical history of Coronary atherosclerosis of native coronary artery, Dyspnea, History of kidney stones, HLD (hyperlipidemia), Hypertension, and Tobacco user. Surgical: Mr. Depaz  has a past surgical history that includes Foot surgery; Appendectomy; Knee Arthroplasty; Coronary artery bypass graft (2008); Rotator cuff repair; Shoulder surgery; Cholecystectomy (2003); Colonoscopy (N/A, 04/21/2018); and polypectomy (04/21/2018). Family: family history includes Cerebral aneurysm in his father; Heart attack in his mother.  Constitutional Exam  General appearance: Well nourished, well developed, and well hydrated. In no apparent acute distress Vitals:   08/02/18 1353  BP: 133/74  Pulse: (!) 52  Resp: 18  Temp: (!) 97.5 F (36.4 C)  TempSrc: Oral  SpO2: 99%  Weight: 189 lb 3.2 oz (85.8 kg)  Height: 5' 11"  (1.803 m)  Psych/Mental status: Alert, oriented x 3 (person, place, & time)       Eyes: PERLA Respiratory: No evidence of acute respiratory distress   Lumbar Spine Area Exam  Skin & Axial Inspection: No masses, redness, or swelling Alignment: Symmetrical Functional ROM: Unrestricted ROM       Stability: No instability detected Muscle Tone/Strength: Functionally intact. No obvious neuro-muscular anomalies detected. Sensory (Neurological): Unimpaired Palpation: Non-tender         Gait & Posture Assessment  Ambulation: Unassisted Gait: Age-related, senile gait pattern Posture: Flat back   Lower Extremity Exam    Side: Right lower extremity  Side: Left lower extremity  Stability: No instability observed          Stability: No instability observed          Skin & Extremity Inspection: Skin color, temperature, and hair growth are WNL. No  peripheral edema or cyanosis. No masses, redness, swelling, asymmetry, or associated skin lesions. No contractures.  Skin & Extremity Inspection: Skin color, temperature, and hair growth are WNL. No peripheral edema or cyanosis. No masses, redness, swelling, asymmetry, or associated skin lesions. No contractures.  Functional ROM: Unrestricted ROM                  Functional ROM: Unrestricted ROM                  Muscle Tone/Strength: Functionally intact. No obvious neuro-muscular anomalies detected.  Muscle Tone/Strength: Functionally intact. No obvious neuro-muscular anomalies detected.  Sensory (Neurological): Unimpaired        Sensory (Neurological): Unimpaired            Palpation: No palpable anomalies  Palpation: No palpable anomalies   Assessment  Primary Diagnosis & Pertinent Problem List: The primary encounter diagnosis was Lumbar spondylosis. Diagnoses of Lumbar degenerative disc disease, Chronic pain syndrome, and Long term prescription opiate use were also pertinent to this visit.  Status Diagnosis  Controlled Controlled Controlled 1. Lumbar spondylosis   2. Lumbar degenerative disc disease   3. Chronic pain syndrome   4. Long term prescription opiate use     Problems updated and reviewed during this visit: No problems updated. Plan of Care  Pharmacotherapy (Medications Ordered): Meds ordered this encounter  Medications  . oxyCODONE-acetaminophen (PERCOCET) 10-325 MG tablet    Sig: Take 1 tablet by mouth every 8 (eight) hours as needed for pain.    Dispense:  90 tablet    Refill:  0    Do not place this medication, or any other prescription from our practice, on "Automatic Refill". Patient may have prescription filled one day early if pharmacy is closed on scheduled refill date.    Order Specific Question:  Supervising Provider    Answer:   Milinda Pointer [phone]  . oxyCODONE-acetaminophen (PERCOCET) 10-325 MG tablet    Sig: Take 1 tablet by mouth every 8 (eight)  hours as needed for pain.    Dispense:  90 tablet    Refill:  0    Do not place this medication, or any other prescription from our practice, on "Automatic Refill". Patient may have prescription filled one day early if pharmacy is closed on scheduled refill date.    Order Specific Question:   Supervising Provider    Answer:   Milinda Pointer [phone]  . oxyCODONE-acetaminophen (PERCOCET) 10-325 MG tablet    Sig: Take 1 tablet by mouth every 8 (eight) hours as needed for pain.    Dispense:  90 tablet    Refill:  0    Do not place this medication, or any other prescription from our practice, on "Automatic Refill". Patient may have prescription filled one day early if pharmacy is closed on scheduled refill date.    Order Specific Question:   Supervising Provider    Answer:   Milinda Pointer [[id number]]   New Prescriptions   No medications on file   Medications administered today: [name] had no medications administered during this visit. Lab-work, procedure(s), and/or referral(s): Orders Placed This Encounter  Procedures  . ToxASSURE Select 13 (MW), Urine   Imaging and/or referral(s): None   Considering:   -Lumbar facet medial branch nerve blocks for lumbar spondylosis -Bilateral SI joint injection -Bilateral genicular nerve block -Rshoulder injection   PRN Procedures:   To be determined at a later time    Provider-requested follow-up: Return in about 3 months (around 11/01/2018) for MedMgmt.  Future Appointments  Date Time Provider [place]  11/16/2018  1:30 PM Vevelyn Francois, NP [facility] None   Primary Care Physician: Patient, No Pcp Per Location: [facility] Outpatient Pain Management Facility Note by: Vevelyn Francois NP Date: 08/02/2018; Time: 3:51 PM  Pain Score Disclaimer: We use the NRS-11 scale. This is a self-reported, subjective measurement of pain severity with only modest accuracy. It is used primarily to identify changes within a particular  patient. It must be understood that outpatient pain scales are significantly less accurate that those used for research, where they can be applied under ideal controlled circumstances with minimal exposure to variables. In reality, the score is likely to be a combination of pain intensity and pain affect, where pain affect describes the degree of emotional arousal or changes in action readiness caused by the sensory experience of pain. Factors such as social and work situation, setting, emotional state, anxiety levels, expectation, and prior pain experience may influence pain perception and show large inter-individual differences that may also be affected by time variables.  Patient instructions provided during this appointment: Patient Instructions   ____________________________________________________________________________________________  Medication Rules  Purpose: To inform patients, and their family members, of our rules and regulations.  Applies to: All patients receiving prescriptions (written or electronic).  Pharmacy of record: Pharmacy where electronic prescriptions will be sent. If written prescriptions are taken to a different pharmacy, please inform the nursing staff. The pharmacy listed in the electronic medical record should be the one where you would like electronic prescriptions to be sent.  Electronic prescriptions: In compliance with the [place] (STOP) Act of 2017 (Session Lanny Cramp [id number]), effective August 25, 2018, all controlled substances must be electronically prescribed. Calling prescriptions to the pharmacy will cease to exist.  Prescription  refills: Only during scheduled appointments. Applies to all prescriptions.  NOTE: The following applies primarily to controlled substances (Opioid* Pain Medications).   Patient's responsibilities: 1. Pain Pills: Bring all pain pills to every appointment (except for procedure  appointments). 2. Pill Bottles: Bring pills in original pharmacy bottle. Always bring the newest bottle. Bring bottle, even if empty. 3. Medication refills: You are responsible for knowing and keeping track of what medications you take and those you need refilled. The day before your appointment: write a list of all prescriptions that need to be refilled. The day of the appointment: give the list to the admitting nurse. Prescriptions will be written only during appointments. If you forget a medication: it will not be "Called in", "Faxed", or "electronically sent". You will need to get another appointment to get these prescribed. No early refills. Do not call asking to have your prescription filled early. 4. Prescription Accuracy: You are responsible for carefully inspecting your prescriptions before leaving our office. Have the discharge nurse carefully go over each prescription with you, before taking them home. Make sure that your name is accurately spelled, that your address is correct. Check the name and dose of your medication to make sure it is accurate. Check the number of pills, and the written instructions to make sure they are clear and accurate. Make sure that you are given enough medication to last until your next medication refill appointment. 5. Taking Medication: Take medication as prescribed. When it comes to controlled substances, taking less pills or less frequently than prescribed is permitted and encouraged. Never take more pills than instructed. Never take medication more frequently than prescribed.  6. Inform other Doctors: Always inform, all of your healthcare providers, of all the medications you take. 7. Pain Medication from other Providers: You are not allowed to accept any additional pain medication from any other Doctor or Healthcare provider. There are two exceptions to this rule. (see below) In the event that you require additional pain medication, you are responsible for  notifying us, as stated below. 8. Medication Agreement: You are responsible for carefully reading and following our Medication Agreement. This must be signed before receiving any prescriptions from our practice. Safely store a copy of your signed Agreement. Violations to the Agreement will result in no further prescriptions. (Additional copies of our Medication Agreement are available upon request.) 9. Laws, Rules, & Regulations: All patients are expected to follow all Federal and [employer], [employer], Rules, [employer] [facility]. Ignorance of the Laws does not constitute a valid excuse. The use of any illegal substances is prohibited. 10. Adopted CDC guidelines & recommendations: Target dosing levels will be at or below 60 MME/day. Use of benzodiazepines** is not recommended.  Exceptions: There are only two exceptions to the rule of not receiving pain medications from other Healthcare Providers. 1. Exception #1 (Emergencies): In the event of an emergency (i.e.: accident requiring emergency care), you are allowed to receive additional pain medication. However, you are responsible for: As soon as you are able, call our office [phone], at any time of the day or night, and leave a message stating your name, the date and nature of the emergency, and the name and dose of the medication prescribed. In the event that your call is answered by a member of our staff, make sure to document and save the date, time, and the name of the person that took your information.  2. Exception #2 (Planned Surgery): In the event that you are scheduled by  another doctor or dentist to have any type of surgery or procedure, you are allowed (for a period no longer than 30 days), to receive additional pain medication, for the acute post-op pain. However, in this case, you are responsible for picking up a copy of our "Post-op Pain Management for Surgeons" handout, and giving it to your surgeon or dentist. This document is available at our  office, and does not require an appointment to obtain it. Simply go to our office during business hours (Monday-Thursday from 8:00 AM to 4:00 PM) (Friday 8:00 AM to 12:00 Noon) or if you have a scheduled appointment with [place], prior to your surgery, and ask for it by name. In addition, you will need to provide [place] with your name, name of your surgeon, type of surgery, and date of procedure or surgery.  *Opioid medications include: morphine, codeine, oxycodone, oxymorphone, hydrocodone, hydromorphone, meperidine, tramadol, tapentadol, buprenorphine, fentanyl, methadone. **Benzodiazepine medications include: diazepam (Valium), alprazolam (Xanax), clonazepam (Klonopine), lorazepam (Ativan), clorazepate (Tranxene), chlordiazepoxide (Librium), estazolam (Prosom), oxazepam (Serax), temazepam (Restoril), triazolam (Halcion) (Last updated: 10/22/2017) ____________________________________________________________________________________________    ______________________________________________________________________________________________  Specialty Pain Scale  Introduction:  There are significant differences in how pain is reported. The word pain usually refers to physical pain, but it is also a common synonym of suffering. The medical community uses a scale from 0 (zero) to 10 (ten) to report pain level. Zero (0) is described as "no pain", while ten (10) is described as "the worse pain you can imagine". The problem with this scale is that physical pain is reported along with suffering. Suffering refers to mental pain, or more often yet it refers to any unpleasant feeling, emotion or aversion associated with the perception of harm or threat of harm. It is the psychological component of pain.  Pain Specialists prefer to separate the two components. The pain scale used by this practice is the Verbal Numerical Rating Scale (VNRS-11). This scale is for the physical pain only. DO NOT INCLUDE how your pain  psychologically affects you. This scale is for adults 38 years of age and older. It has 11 (eleven) levels. The 1st level is 0/10. This means: "right now, I have no pain". In the context of pain management, it also means: "right now, my physical pain is under control with the current therapy".  General Information:  The scale should reflect your current level of pain. Unless you are specifically asked for the level of your worst pain, or your average pain. If you are asked for one of these two, then it should be understood that it is over the past 24 hours.  Levels 1 (one) through 5 (five) are described below, and can be treated as an outpatient. Ambulatory pain management facilities such as ours are more than adequate to treat these levels. Levels 6 (six) through 10 (ten) are also described below, however, these must be treated as a hospitalized patient. While levels 6 (six) and 7 (seven) may be evaluated at an urgent care facility, levels 8 (eight) through 10 (ten) constitute medical emergencies and as such, they belong in a hospital's emergency department. When having these levels (as described below), do not come to our office. Our facility is not equipped to manage these levels. Go directly to an urgent care facility or an emergency department to be evaluated.  Definitions:  Activities of Daily Living (ADL): Activities of daily living (ADL or ADLs) is a term used in healthcare to refer to people's daily self-care activities. Health professionals often  use a person's ability or inability to perform ADLs as a measurement of their functional status, particularly in regard to people post injury, with disabilities and the elderly. There are two ADL levels: Basic and Instrumental. Basic Activities of Daily Living (BADL  or BADLs) consist of self-care tasks that include: Bathing and showering; personal hygiene and grooming (including brushing/combing/styling hair); dressing; Toilet hygiene (getting to the  toilet, cleaning oneself, and getting back up); eating and self-feeding (not including cooking or chewing and swallowing); functional mobility, often referred to as "transferring", as measured by the ability to walk, get in and out of bed, and get into and out of a chair; the broader definition (moving from one place to another while performing activities) is useful for people with different physical abilities who are still able to get around independently. Basic ADLs include the things many people do when they get up in the morning and get ready to go out of the house: get out of bed, go to the toilet, bathe, dress, groom, and eat. On the average, loss of function typically follows a particular order. Hygiene is the first to go, followed by loss of toilet use and locomotion. The last to go is the ability to eat. When there is only one remaining area in which the person is independent, there is a 62.9% chance that it is eating and only a 3.5% chance that it is hygiene. Instrumental Activities of Daily Living (IADL or IADLs) are not necessary for fundamental functioning, but they let an individual live independently in a community. IADL consist of tasks that include: cleaning and maintaining the house; home establishment and maintenance; care of others (including selecting and supervising caregivers); care of pets; child rearing; managing money; managing financials (investments, etc.); meal preparation and cleanup; shopping for groceries and necessities; moving within the community; safety procedures and emergency responses; health management and maintenance (taking prescribed medications); and using the telephone or other form of communication.  Instructions:  Most patients tend to report their pain as a combination of two factors, their physical pain and their psychosocial pain. This last one is also known as "suffering" and it is reflection of how physical pain affects you socially and psychologically. From  now on, report them separately.  From this point on, when asked to report your pain level, report only your physical pain. Use the following table for reference.  Pain Clinic Pain Levels (0-5/10)  Pain Level Score  Description  No Pain 0   Mild pain 1 Nagging, annoying, but does not interfere with basic activities of daily living (ADL). Patients are able to eat, bathe, get dressed, toileting (being able to get on and off the toilet and perform personal hygiene functions), transfer (move in and out of bed or a chair without assistance), and maintain continence (able to control bladder and bowel functions). Blood pressure and heart rate are unaffected. A normal heart rate for a healthy adult ranges from 60 to 100 bpm (beats per minute).   Mild to moderate pain 2 Noticeable and distracting. Impossible to hide from other people. More frequent flare-ups. Still possible to adapt and function close to normal. It can be very annoying and may have occasional stronger flare-ups. With discipline, patients may get used to it and adapt.   Moderate pain 3 Interferes significantly with activities of daily living (ADL). It becomes difficult to feed, bathe, get dressed, get on and off the toilet or to perform personal hygiene functions. Difficult to get in and out  of bed or a chair without assistance. Very distracting. With effort, it can be ignored when deeply involved in activities.   Moderately severe pain 4 Impossible to ignore for more than a few minutes. With effort, patients may still be able to manage work or participate in some social activities. Very difficult to concentrate. Signs of autonomic nervous system discharge are evident: dilated pupils (mydriasis); mild sweating (diaphoresis); sleep interference. Heart rate becomes elevated (>115 bpm). Diastolic blood pressure (lower number) rises above 100 mmHg. Patients find relief in laying down and not moving.   Severe pain 5 Intense and extremely unpleasant.  Associated with frowning face and frequent crying. Pain overwhelms the senses.  Ability to do any activity or maintain social relationships becomes significantly limited. Conversation becomes difficult. Pacing back and forth is common, as getting into a comfortable position is nearly impossible. Pain wakes you up from deep sleep. Physical signs will be obvious: pupillary dilation; increased sweating; goosebumps; brisk reflexes; cold, clammy hands and feet; nausea, vomiting or dry heaves; loss of appetite; significant sleep disturbance with inability to fall asleep or to remain asleep. When persistent, significant weight loss is observed due to the complete loss of appetite and sleep deprivation.  Blood pressure and heart rate becomes significantly elevated. Caution: If elevated blood pressure triggers a pounding headache associated with blurred vision, then the patient should immediately seek attention at an urgent or emergency care unit, as these may be signs of an impending stroke.    Emergency Department Pain Levels (6-10/10)  Emergency Room Pain 6 Severely limiting. Requires emergency care and should not be seen or managed at an outpatient pain management facility. Communication becomes difficult and requires great effort. Assistance to reach the emergency department may be required. Facial flushing and profuse sweating along with potentially dangerous increases in heart rate and blood pressure will be evident.   Distressing pain 7 Self-care is very difficult. Assistance is required to transport, or use restroom. Assistance to reach the emergency department will be required. Tasks requiring coordination, such as bathing and getting dressed become very difficult.   Disabling pain 8 Self-care is no longer possible. At this level, pain is disabling. The individual is unable to do even the most "basic" activities such as walking, eating, bathing, dressing, transferring to a bed, or toileting. Fine motor  skills are lost. It is difficult to think clearly.   Incapacitating pain 9 Pain becomes incapacitating. Thought processing is no longer possible. Difficult to remember your own name. Control of movement and coordination are lost.   The worst pain imaginable 10 At this level, most patients pass out from pain. When this level is reached, collapse of the autonomic nervous system occurs, leading to a sudden drop in blood pressure and heart rate. This in turn results in a temporary and dramatic drop in blood flow to the brain, leading to a loss of consciousness. Fainting is one of the body's self defense mechanisms. Passing out puts the brain in a calmed state and causes it to shut down for a while, in order to begin the healing process.    Summary: 1. Refer to this scale when providing [place] with your pain level. 2. Be accurate and careful when reporting your pain level. This will help with your care. 3. Over-reporting your pain level will lead to loss of credibility. 4. Even a level of 1/10 means that there is pain and will be treated at our facility. 5. High, inaccurate reporting will be documented as "Symptom  Exaggeration", leading to loss of credibility and suspicions of possible secondary gains such as obtaining more narcotics, or wanting to appear disabled, for fraudulent reasons. 6. Only pain levels of 5 or below will be seen at our facility. 7. Pain levels of 6 and above will be sent to the Emergency Department and the appointment cancelled. ______________________________________________________________________________________________   ALL MEDICATIONS WERE E-SCRIBED TO YOUR PHARMACY.

## 2018-08-02 NOTE — Patient Instructions (Addendum)
____________________________________________________________________________________________  Medication Rules  Purpose: To inform patients, and their family members, of our rules and regulations.  Applies to: All patients receiving prescriptions (written or electronic).  Pharmacy of record: Pharmacy where electronic prescriptions will be sent. If written prescriptions are taken to a different pharmacy, please inform the nursing staff. The pharmacy listed in the electronic medical record should be the one where you would like electronic prescriptions to be sent.  Electronic prescriptions: In compliance with the [place] Strengthen Opioid Misuse Prevention (STOP) Act of 2017 (Session Law 2017-74/H243), effective August 25, 2018, all controlled substances must be electronically prescribed. Calling prescriptions to the pharmacy will cease to exist.  Prescription refills: Only during scheduled appointments. Applies to all prescriptions.  NOTE: The following applies primarily to controlled substances (Opioid* Pain Medications).   Patient's responsibilities: 1. Pain Pills: Bring all pain pills to every appointment (except for procedure appointments). 2. Pill Bottles: Bring pills in original pharmacy bottle. Always bring the newest bottle. Bring bottle, even if empty. 3. Medication refills: You are responsible for knowing and keeping track of what medications you take and those you need refilled. The day before your appointment: write a list of all prescriptions that need to be refilled. The day of the appointment: give the list to the admitting nurse. Prescriptions will be written only during appointments. If you forget a medication: it will not be "Called in", "Faxed", or "electronically sent". You will need to get another appointment to get these prescribed. No early refills. Do not call asking to have your prescription filled early. 4. Prescription Accuracy: You are responsible for  carefully inspecting your prescriptions before leaving our office. Have the discharge nurse carefully go over each prescription with you, before taking them home. Make sure that your name is accurately spelled, that your address is correct. Check the name and dose of your medication to make sure it is accurate. Check the number of pills, and the written instructions to make sure they are clear and accurate. Make sure that you are given enough medication to last until your next medication refill appointment. 5. Taking Medication: Take medication as prescribed. When it comes to controlled substances, taking less pills or less frequently than prescribed is permitted and encouraged. Never take more pills than instructed. Never take medication more frequently than prescribed.  6. Inform other Doctors: Always inform, all of your healthcare providers, of all the medications you take. 7. Pain Medication from other Providers: You are not allowed to accept any additional pain medication from any other Doctor or Healthcare provider. There are two exceptions to this rule. (see below) In the event that you require additional pain medication, you are responsible for notifying us, as stated below. 8. Medication Agreement: You are responsible for carefully reading and following our Medication Agreement. This must be signed before receiving any prescriptions from our practice. Safely store a copy of your signed Agreement. Violations to the Agreement will result in no further prescriptions. (Additional copies of our Medication Agreement are available upon request.) 9. Laws, Rules, & Regulations: All patients are expected to follow all Federal and State Laws, Statutes, Rules, & Regulations. Ignorance of the Laws does not constitute a valid excuse. The use of any illegal substances is prohibited. 10. Adopted CDC guidelines & recommendations: Target dosing levels will be at or below 60 MME/day. Use of benzodiazepines** is not  recommended.  Exceptions: There are only two exceptions to the rule of not receiving pain medications from other Healthcare Providers. 1.   Exception #1 (Emergencies): In the event of an emergency (i.e.: accident requiring emergency care), you are allowed to receive additional pain medication. However, you are responsible for: As soon as you are able, call our office [phone], at any time of the day or night, and leave a message stating your name, the date and nature of the emergency, and the name and dose of the medication prescribed. In the event that your call is answered by a member of our staff, make sure to document and save the date, time, and the name of the person that took your information.  2. Exception #2 (Planned Surgery): In the event that you are scheduled by another doctor or dentist to have any type of surgery or procedure, you are allowed (for a period no longer than 30 days), to receive additional pain medication, for the acute post-op pain. However, in this case, you are responsible for picking up a copy of our "Post-op Pain Management for Surgeons" handout, and giving it to your surgeon or dentist. This document is available at our office, and does not require an appointment to obtain it. Simply go to our office during business hours (Monday-Thursday from 8:00 AM to 4:00 PM) (Friday 8:00 AM to 12:00 Noon) or if you have a scheduled appointment with [place], prior to your surgery, and ask for it by name. In addition, you will need to provide [place] with your name, name of your surgeon, type of surgery, and date of procedure or surgery.  *Opioid medications include: morphine, codeine, oxycodone, oxymorphone, hydrocodone, hydromorphone, meperidine, tramadol, tapentadol, buprenorphine, fentanyl, methadone. **Benzodiazepine medications include: diazepam (Valium), alprazolam (Xanax), clonazepam (Klonopine), lorazepam (Ativan), clorazepate (Tranxene), chlordiazepoxide (Librium), estazolam (Prosom),  oxazepam (Serax), temazepam (Restoril), triazolam (Halcion) (Last updated: 10/22/2017) ____________________________________________________________________________________________    ______________________________________________________________________________________________  Specialty Pain Scale  Introduction:  There are significant differences in how pain is reported. The word pain usually refers to physical pain, but it is also a common synonym of suffering. The medical community uses a scale from 0 (zero) to 10 (ten) to report pain level. Zero (0) is described as "no pain", while ten (10) is described as "the worse pain you can imagine". The problem with this scale is that physical pain is reported along with suffering. Suffering refers to mental pain, or more often yet it refers to any unpleasant feeling, emotion or aversion associated with the perception of harm or threat of harm. It is the psychological component of pain.  Pain Specialists prefer to separate the two components. The pain scale used by this practice is the Verbal Numerical Rating Scale (VNRS-11). This scale is for the physical pain only. DO NOT INCLUDE how your pain psychologically affects you. This scale is for adults 75 years of age and older. It has 11 (eleven) levels. The 1st level is 0/10. This means: "right now, I have no pain". In the context of pain management, it also means: "right now, my physical pain is under control with the current therapy".  General Information:  The scale should reflect your current level of pain. Unless you are specifically asked for the level of your worst pain, or your average pain. If you are asked for one of these two, then it should be understood that it is over the past 24 hours.  Levels 1 (one) through 5 (five) are described below, and can be treated as an outpatient. Ambulatory pain management facilities such as ours are more than adequate to treat these levels. Levels 6 (six) through  10 (ten) are also described below, however, these must be treated as a hospitalized patient. While levels 6 (six) and 7 (seven) may be evaluated at an urgent care facility, levels 8 (eight) through 10 (ten) constitute medical emergencies and as such, they belong in a hospital's emergency department. When having these levels (as described below), do not come to our office. Our facility is not equipped to manage these levels. Go directly to an urgent care facility or an emergency department to be evaluated.  Definitions:  Activities of Daily Living (ADL): Activities of daily living (ADL or ADLs) is a term used in healthcare to refer to people's daily self-care activities. Health professionals often use a person's ability or inability to perform ADLs as a measurement of their functional status, particularly in regard to people post injury, with disabilities and the elderly. There are two ADL levels: Basic and Instrumental. Basic Activities of Daily Living (BADL  or BADLs) consist of self-care tasks that include: Bathing and showering; personal hygiene and grooming (including brushing/combing/styling hair); dressing; Toilet hygiene (getting to the toilet, cleaning oneself, and getting back up); eating and self-feeding (not including cooking or chewing and swallowing); functional mobility, often referred to as "transferring", as measured by the ability to walk, get in and out of bed, and get into and out of a chair; the broader definition (moving from one place to another while performing activities) is useful for people with different physical abilities who are still able to get around independently. Basic ADLs include the things many people do when they get up in the morning and get ready to go out of the house: get out of bed, go to the toilet, bathe, dress, groom, and eat. On the average, loss of function typically follows a particular order. Hygiene is the first to go, followed by loss of toilet use and  locomotion. The last to go is the ability to eat. When there is only one remaining area in which the person is independent, there is a 62.9% chance that it is eating and only a 3.5% chance that it is hygiene. Instrumental Activities of Daily Living (IADL or IADLs) are not necessary for fundamental functioning, but they let an individual live independently in a community. IADL consist of tasks that include: cleaning and maintaining the house; home establishment and maintenance; care of others (including selecting and supervising caregivers); care of pets; child rearing; managing money; managing financials (investments, etc.); meal preparation and cleanup; shopping for groceries and necessities; moving within the community; safety procedures and emergency responses; health management and maintenance (taking prescribed medications); and using the telephone or other form of communication.  Instructions:  Most patients tend to report their pain as a combination of two factors, their physical pain and their psychosocial pain. This last one is also known as "suffering" and it is reflection of how physical pain affects you socially and psychologically. From now on, report them separately.  From this point on, when asked to report your pain level, report only your physical pain. Use the following table for reference.  Pain Clinic Pain Levels (0-5/10)  Pain Level Score  Description  No Pain 0   Mild pain 1 Nagging, annoying, but does not interfere with basic activities of daily living (ADL). Patients are able to eat, bathe, get dressed, toileting (being able to get on and off the toilet and perform personal hygiene functions), transfer (move in and out of bed or a chair without assistance), and maintain continence (able to control bladder  and bowel functions). Blood pressure and heart rate are unaffected. A normal heart rate for a healthy adult ranges from 60 to 100 bpm (beats per minute).   Mild to moderate pain  2 Noticeable and distracting. Impossible to hide from other people. More frequent flare-ups. Still possible to adapt and function close to normal. It can be very annoying and may have occasional stronger flare-ups. With discipline, patients may get used to it and adapt.   Moderate pain 3 Interferes significantly with activities of daily living (ADL). It becomes difficult to feed, bathe, get dressed, get on and off the toilet or to perform personal hygiene functions. Difficult to get in and out of bed or a chair without assistance. Very distracting. With effort, it can be ignored when deeply involved in activities.   Moderately severe pain 4 Impossible to ignore for more than a few minutes. With effort, patients may still be able to manage work or participate in some social activities. Very difficult to concentrate. Signs of autonomic nervous system discharge are evident: dilated pupils (mydriasis); mild sweating (diaphoresis); sleep interference. Heart rate becomes elevated (>115 bpm). Diastolic blood pressure (lower number) rises above 100 mmHg. Patients find relief in laying down and not moving.   Severe pain 5 Intense and extremely unpleasant. Associated with frowning face and frequent crying. Pain overwhelms the senses.  Ability to do any activity or maintain social relationships becomes significantly limited. Conversation becomes difficult. Pacing back and forth is common, as getting into a comfortable position is nearly impossible. Pain wakes you up from deep sleep. Physical signs will be obvious: pupillary dilation; increased sweating; goosebumps; brisk reflexes; cold, clammy hands and feet; nausea, vomiting or dry heaves; loss of appetite; significant sleep disturbance with inability to fall asleep or to remain asleep. When persistent, significant weight loss is observed due to the complete loss of appetite and sleep deprivation.  Blood pressure and heart rate becomes significantly elevated. Caution:  If elevated blood pressure triggers a pounding headache associated with blurred vision, then the patient should immediately seek attention at an urgent or emergency care unit, as these may be signs of an impending stroke.    Emergency Department Pain Levels (6-10/10)  Emergency Room Pain 6 Severely limiting. Requires emergency care and should not be seen or managed at an outpatient pain management facility. Communication becomes difficult and requires great effort. Assistance to reach the emergency department may be required. Facial flushing and profuse sweating along with potentially dangerous increases in heart rate and blood pressure will be evident.   Distressing pain 7 Self-care is very difficult. Assistance is required to transport, or use restroom. Assistance to reach the emergency department will be required. Tasks requiring coordination, such as bathing and getting dressed become very difficult.   Disabling pain 8 Self-care is no longer possible. At this level, pain is disabling. The individual is unable to do even the most "basic" activities such as walking, eating, bathing, dressing, transferring to a bed, or toileting. Fine motor skills are lost. It is difficult to think clearly.   Incapacitating pain 9 Pain becomes incapacitating. Thought processing is no longer possible. Difficult to remember your own name. Control of movement and coordination are lost.   The worst pain imaginable 10 At this level, most patients pass out from pain. When this level is reached, collapse of the autonomic nervous system occurs, leading to a sudden drop in blood pressure and heart rate. This in turn results in a temporary and dramatic drop in  blood flow to the brain, leading to a loss of consciousness. Fainting is one of the body's self defense mechanisms. Passing out puts the brain in a calmed state and causes it to shut down for a while, in order to begin the healing process.    Summary: 1. Refer to this  scale when providing [place] with your pain level. 2. Be accurate and careful when reporting your pain level. This will help with your care. 3. Over-reporting your pain level will lead to loss of credibility. 4. Even a level of 1/10 means that there is pain and will be treated at our facility. 5. High, inaccurate reporting will be documented as "Symptom Exaggeration", leading to loss of credibility and suspicions of possible secondary gains such as obtaining more narcotics, or wanting to appear disabled, for fraudulent reasons. 6. Only pain levels of 5 or below will be seen at our facility. 7. Pain levels of 6 and above will be sent to the Emergency Department and the appointment cancelled. ______________________________________________________________________________________________   ALL MEDICATIONS WERE E-SCRIBED TO YOUR PHARMACY.

## 2018-08-02 NOTE — Progress Notes (Signed)
Nursing Pain Medication Assessment:  Safety precautions to be maintained throughout the outpatient stay will include: orient to surroundings, keep bed in low position, maintain call bell within reach at all times, provide assistance with transfer out of bed and ambulation.  Medication Inspection Compliance: Pill count conducted under aseptic conditions, in front of the patient. Neither the pills nor the bottle was removed from the patient's sight at any time. Once count was completed pills were immediately returned to the patient in their original bottle.  Medication: Oxycodone/APAP Pill/Patch Count: 71 of 90 pills remain Pill/Patch Appearance: Markings consistent with prescribed medication Bottle Appearance: Standard pharmacy container. Clearly labeled. Filled Date: 76 / 02 / 2019 Last Medication intake:  Today

## 2018-08-06 LAB — TOXASSURE SELECT 13 (MW), URINE

## 2018-08-21 ENCOUNTER — Other Ambulatory Visit: Payer: Self-pay | Admitting: Family Medicine

## 2018-08-21 DIAGNOSIS — F324 Major depressive disorder, single episode, in partial remission: Secondary | ICD-10-CM

## 2018-11-16 ENCOUNTER — Encounter: Payer: Medicare HMO | Admitting: Nurse Practitioner

## 2018-11-18 ENCOUNTER — Other Ambulatory Visit: Payer: Self-pay

## 2018-11-18 ENCOUNTER — Ambulatory Visit: Payer: Medicare HMO | Attending: Nurse Practitioner | Admitting: Nurse Practitioner

## 2018-11-18 DIAGNOSIS — M5136 Other intervertebral disc degeneration, lumbar region: Secondary | ICD-10-CM

## 2018-11-18 DIAGNOSIS — M47816 Spondylosis without myelopathy or radiculopathy, lumbar region: Secondary | ICD-10-CM | POA: Diagnosis not present

## 2018-11-18 DIAGNOSIS — M51369 Other intervertebral disc degeneration, lumbar region without mention of lumbar back pain or lower extremity pain: Secondary | ICD-10-CM

## 2018-11-18 DIAGNOSIS — G894 Chronic pain syndrome: Secondary | ICD-10-CM | POA: Diagnosis not present

## 2018-11-18 MED ORDER — OXYCODONE-ACETAMINOPHEN 10-325 MG PO TABS: 1.0000 | ORAL_TABLET | Freq: Three times a day (TID) | ORAL | 0 refills | Status: AC | PRN

## 2018-11-18 MED ORDER — OXYCODONE-ACETAMINOPHEN 10-325 MG PO TABS: 1.0000 | ORAL_TABLET | Freq: Three times a day (TID) | ORAL | 0 refills | Status: DC | PRN

## 2018-11-18 NOTE — Patient Instructions (Signed)
____________________________________________________________________________________________  Medication Rules  Purpose: To inform patients, and their family members, of our rules and regulations.  Applies to: All patients receiving prescriptions (written or electronic).  Pharmacy of record: Pharmacy where electronic prescriptions will be sent. If written prescriptions are taken to a different pharmacy, please inform the nursing staff. The pharmacy listed in the electronic medical record should be the one where you would like electronic prescriptions to be sent.  Electronic prescriptions: In compliance with the [place] Strengthen Opioid Misuse Prevention (STOP) Act of 2017 (Session Law 2017-74/H243), effective August 25, 2018, all controlled substances must be electronically prescribed. Calling prescriptions to the pharmacy will cease to exist.  Prescription refills: Only during scheduled appointments. Applies to all prescriptions.  NOTE: The following applies primarily to controlled substances (Opioid* Pain Medications).   Patient's responsibilities: 1. Pain Pills: Bring all pain pills to every appointment (except for procedure appointments). 2. Pill Bottles: Bring pills in original pharmacy bottle. Always bring the newest bottle. Bring bottle, even if empty. 3. Medication refills: You are responsible for knowing and keeping track of what medications you take and those you need refilled. The day before your appointment: write a list of all prescriptions that need to be refilled. The day of the appointment: give the list to the admitting nurse. Prescriptions will be written only during appointments. No prescriptions will be written on procedure days. If you forget a medication: it will not be "Called in", "Faxed", or "electronically sent". You will need to get another appointment to get these prescribed. No early refills. Do not call asking to have your prescription filled  early. 4. Prescription Accuracy: You are responsible for carefully inspecting your prescriptions before leaving our office. Have the discharge nurse carefully go over each prescription with you, before taking them home. Make sure that your name is accurately spelled, that your address is correct. Check the name and dose of your medication to make sure it is accurate. Check the number of pills, and the written instructions to make sure they are clear and accurate. Make sure that you are given enough medication to last until your next medication refill appointment. 5. Taking Medication: Take medication as prescribed. When it comes to controlled substances, taking less pills or less frequently than prescribed is permitted and encouraged. Never take more pills than instructed. Never take medication more frequently than prescribed.  6. Inform other Doctors: Always inform, all of your healthcare providers, of all the medications you take. 7. Pain Medication from other Providers: You are not allowed to accept any additional pain medication from any other Doctor or Healthcare provider. There are two exceptions to this rule. (see below) In the event that you require additional pain medication, you are responsible for notifying us, as stated below. 8. Medication Agreement: You are responsible for carefully reading and following our Medication Agreement. This must be signed before receiving any prescriptions from our practice. Safely store a copy of your signed Agreement. Violations to the Agreement will result in no further prescriptions. (Additional copies of our Medication Agreement are available upon request.) 9. Laws, Rules, & Regulations: All patients are expected to follow all Federal and State Laws, Statutes, Rules, & Regulations. Ignorance of the Laws does not constitute a valid excuse. The use of any illegal substances is prohibited. 10. Adopted CDC guidelines & recommendations: Target dosing levels will be  at or below 60 MME/day. Use of benzodiazepines** is not recommended.  Exceptions: There are only two exceptions to the rule of not   receiving pain medications from other Healthcare Providers. 1. Exception #1 (Emergencies): In the event of an emergency (i.e.: accident requiring emergency care), you are allowed to receive additional pain medication. However, you are responsible for: As soon as you are able, call our office [phone], at any time of the day or night, and leave a message stating your name, the date and nature of the emergency, and the name and dose of the medication prescribed. In the event that your call is answered by a member of our staff, make sure to document and save the date, time, and the name of the person that took your information.  2. Exception #2 (Planned Surgery): In the event that you are scheduled by another doctor or dentist to have any type of surgery or procedure, you are allowed (for a period no longer than 30 days), to receive additional pain medication, for the acute post-op pain. However, in this case, you are responsible for picking up a copy of our "Post-op Pain Management for Surgeons" handout, and giving it to your surgeon or dentist. This document is available at our office, and does not require an appointment to obtain it. Simply go to our office during business hours (Monday-Thursday from 8:00 AM to 4:00 PM) (Friday 8:00 AM to 12:00 Noon) or if you have a scheduled appointment with us, prior to your surgery, and ask for it by name. In addition, you will need to provide us with your name, name of your surgeon, type of surgery, and date of procedure or surgery.  *Opioid medications include: morphine, codeine, oxycodone, oxymorphone, hydrocodone, hydromorphone, meperidine, tramadol, tapentadol, buprenorphine, fentanyl, methadone. **Benzodiazepine medications include: diazepam (Valium), alprazolam (Xanax), clonazepam (Klonopine), lorazepam (Ativan), clorazepate  (Tranxene), chlordiazepoxide (Librium), estazolam (Prosom), oxazepam (Serax), temazepam (Restoril), triazolam (Halcion) (Last updated: 10/22/2017) ____________________________________________________________________________________________    

## 2018-11-18 NOTE — Progress Notes (Signed)
Virtual Visit via Telephone Note  I connected with Jack [name] on 11/18/18 at  9:30 AM EDT by telephone and verified that I am speaking with the correct person using two identifiers.   I discussed the limitations, risks, security and privacy concerns of performing an evaluation and management service by telephone and the availability of in person appointments. I also discussed with the patient that there may be a patient responsible charge related to this service. The patient expressed understanding and agreed to proceed.   History of Present Illness: He admits that he missed his apt on 11/16/2018 because he was in the hospital . He woke up Sunday with severe abdominal pain. He was diagnosed with kidney stones, a UTI and increased fluid around his heart. He admits that he is going to follow up with a Urology. He states that he did not get any additional medication for pain. He was started on 3 new medications Benazepril, furosemide and trazodone. He admits that he is not having any back pain today. He denies any side effects of his Oxycodone/APAP. He denies any new concerns today. He denies any additional questions.     Observations/Objective:Abdominal pain related to Renal stone and UTI. Stable Low back pain.   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    I discussed the assessment and treatment plan with the patient. The patient was provided an opportunity to ask questions and all were answered. The patient agreed with the plan and demonstrated an understanding of the instructions.   The patient was advised to call back or seek an in-person evaluation if the symptoms worsen or if the condition fails to improve as anticipated.  I provided 10 minutes of non-face-to-face time during this encounter.   Dionisio David, NP

## 2018-11-23 ENCOUNTER — Encounter: Payer: Self-pay | Admitting: *Deleted

## 2018-11-23 ENCOUNTER — Telehealth: Payer: Self-pay | Admitting: Cardiology

## 2018-11-23 MED ORDER — CARVEDILOL 3.125 MG PO TABS: 3.1250 mg | ORAL_TABLET | Freq: Two times a day (BID) | ORAL | 0 refills | Status: DC

## 2018-11-23 NOTE — Telephone Encounter (Signed)
He was not on Coreg when I last saw him, that is also an unusual dose for him to be taking.  We need to clarify his full medication list.

## 2018-11-23 NOTE — Telephone Encounter (Signed)
Pt say HR 45-60 c/o fatigue since last Wednesday. Pt was at [facility] last week for kidney stone - says CT scan was done - will request records - pt says he is currently taking Coreg 12.5 mg every other day - says no med changes when d/c from hospital - LM with Pam at [facility] to fax clearance form

## 2018-11-23 NOTE — Telephone Encounter (Signed)
Spoke with daughter and pt to clarify with medication bottles at home, updated medication list in pt chart

## 2018-11-23 NOTE — Telephone Encounter (Signed)
Pam at [facility] called stating that the patient is going to have a lithotripsy and they're requiring a clearance for this patient. They're planning to do it on Monday.   Please give Pam a call @ [phone] ext [phone]

## 2018-11-23 NOTE — Telephone Encounter (Signed)
This pt hasn't been seen since 2018 - would he need an appt?

## 2018-11-23 NOTE — Telephone Encounter (Signed)
Pt voiced understanding - requested new rx sent to [facility] - will monitor HR

## 2018-11-23 NOTE — Telephone Encounter (Signed)
Coreg should be taken twice daily for optimal effect.  Taking a single 12.5 mg dose every other day is probably not doing very much for him at least in terms of blood pressure, and it could be contributing to his low heart rate at least intermittently.  I would suggest that he take his Coreg at 1/2 tablet (3.125 mg) twice daily and then keep a closer eye on blood pressure and heart rate.  We could always uptitrate the dose if needed.

## 2018-11-23 NOTE — Telephone Encounter (Signed)
Brandie (Daughter) called stating that Mr. Dente was recently at [facility] last week. States that he is fatigue, heart rate is low.

## 2018-11-23 NOTE — Telephone Encounter (Signed)
Have them send a clearance form to the [place] office.  We can likely just schedule him for a phone visit to make sure nothing is new that we need to address.

## 2018-11-24 ENCOUNTER — Telehealth (INDEPENDENT_AMBULATORY_CARE_PROVIDER_SITE_OTHER): Payer: Medicare HMO | Admitting: Cardiology

## 2018-11-24 ENCOUNTER — Other Ambulatory Visit: Payer: Self-pay | Admitting: Urology

## 2018-11-24 DIAGNOSIS — I10 Essential (primary) hypertension: Secondary | ICD-10-CM | POA: Diagnosis not present

## 2018-11-24 DIAGNOSIS — I25119 Atherosclerotic heart disease of native coronary artery with unspecified angina pectoris: Secondary | ICD-10-CM

## 2018-11-24 DIAGNOSIS — R001 Bradycardia, unspecified: Secondary | ICD-10-CM

## 2018-11-24 DIAGNOSIS — Z01818 Encounter for other preprocedural examination: Secondary | ICD-10-CM

## 2018-11-24 MED ORDER — NITROGLYCERIN 0.4 MG SL SUBL: 0.4000 mg | SUBLINGUAL_TABLET | SUBLINGUAL | 3 refills | Status: DC | PRN

## 2018-11-24 NOTE — Patient Instructions (Addendum)
Medication Instructions:   Nitroglycerin prescription sent to [facility] today.  May take as needed for severe chest pain.  Directions for use will be on the bottle.   Continue all other current medications.  Labwork: none  Testing/Procedures: none  Follow-Up: 3 months   Any Other Special Instructions Will Be Listed Below (If Applicable).  If you need a refill on your cardiac medications before your next appointment, please call your pharmacy.

## 2018-11-24 NOTE — Progress Notes (Signed)
Virtual Visit via Telephone Note    Evaluation Performed:  Follow-up visit  This visit type was conducted due to national recommendations for restrictions regarding the COVID-19 Pandemic (e.g. social distancing).  This format is felt to be most appropriate for this patient at this time.  All issues noted in this document were discussed and addressed.  No physical exam was performed (except for noted visual exam findings with Video Visits).  The patient gave verbal consent for a telehealth phone visit today with [facility] and understands that their insurance company will be billed for the encounter.  Date:  11/24/2018   ID:  Jack [name], DOB July 04, 1946, MRN [id number]  Patient Location:  [address] [place], [place] [address]  Provider location:   [facility] [place], [place], [place] [address] Phone: [phone]; Fax: [phone]  Cardiologist: Satira Sark. MD   Chief Complaint: Preprocedure cardiac assessment  History of Present Illness:    Jack [name] is a 73 y.o. male who presents via audio conferencing for a telehealth visit today.  He was last seen in December 2018.  We received communication from [facility] regarding anticipated left extracorporeal shockwave lithotripsy on April 6 for treatment of symptomatic nephrolithiasis.  Patient was evaluated by Dr. Gloriann Loan.  From a cardiac perspective, he reports only occasional angina symptoms, does not have nitroglycerin available at home.  He also tells me that he has been weak and has had bradycardia although blood pressure has been reasonably well-controlled.  He reports no sudden dizziness or syncope.  I reviewed his medications.  At some point since I last saw him he has been on Coreg at an unusual dose - 6.25 mg 2 tablets once every other day (??).  We clarified that he should reduce his Coreg to 3.125 mg twice daily for now and continue to track heart rate and blood pressure.   May need to stop altogether if he remains symptomatic with bradycardia.  I do not have a recent ECG for review.   Prior CV studies:   The following studies were reviewed today:  Lexiscan Myoview 03/21/2015:  Findings consistent with prior myocardial infarction. Evidence of prior inferior infarct with no peri-infarct ischemia  This is an intermediate risk study. Intermediate risk due to mildly decreased LVEF, there is no current myocardium at jeopardy.  The left ventricular ejection fraction is mildly decreased (45-54%).  Past Medical History:  Diagnosis Date  . Coronary atherosclerosis of native coronary artery    Multivessel status post CABG  . Dyspnea    with over exertion  . History of kidney stones   . HLD (hyperlipidemia)   . Hypertension   . Tobacco user    Past Surgical History:  Procedure Laterality Date  . APPENDECTOMY     age 34  . CHOLECYSTECTOMY  2003  . COLONOSCOPY N/A 04/21/2018   Procedure: COLONOSCOPY;  Surgeon: Rogene Houston, MD;  Location: [facility];  Service: Endoscopy;  Laterality: N/A;  12:00-rescheduled to 8/28 @ 10:30am per Lelon Frohlich  . CORONARY ARTERY BYPASS GRAFT  2008   LIMA to LAD, SVG to ramus, SVG to PDA and PLA  . FOOT SURGERY    . KNEE ARTHROPLASTY    . POLYPECTOMY  04/21/2018   Procedure: POLYPECTOMY;  Surgeon: Rogene Houston, MD;  Location: [facility];  Service: Endoscopy;;  . ROTATOR CUFF REPAIR    . SHOULDER SURGERY  Current Meds  Medication Sig  . amLODipine (NORVASC) 5 MG tablet Take 5 mg by mouth every other day.   Marland Kitchen aspirin EC 81 MG tablet Take 1 tablet (81 mg total) by mouth daily.  . carvedilol (COREG) 3.125 MG tablet Take 1 tablet (3.125 mg total) by mouth 2 (two) times daily.  . Cholecalciferol (VITAMIN D3) 1000 units CAPS Take 1,000 Units by mouth daily.  . citalopram (CELEXA) 40 MG tablet Take 1 tablet (40 mg total) by mouth daily.  Marland Kitchen oxyCODONE-acetaminophen (PERCOCET) 10-325 MG tablet Take 1 tablet by mouth every  8 (eight) hours as needed for up to 30 days for pain.  . polyethylene glycol (MIRALAX / GLYCOLAX) packet Take 17 g by mouth daily.   . rosuvastatin (CRESTOR) 20 MG tablet Take 20 mg by mouth daily.  . traZODone (DESYREL) 50 MG tablet      Allergies:   Patient has no known allergies.   Social History   Tobacco Use  . Smoking status: Current Every Day Smoker    Packs/day: 1.00    Years: 57.00    Pack years: 57.00    Types: Cigarettes  . Smokeless tobacco: Never Used  Substance Use Topics  . Alcohol use: Yes    Alcohol/week: 0.0 standard drinks  . Drug use: No     Family Hx: The patient's family history includes Cerebral aneurysm in his father; Heart attack in his mother. There is no history of Coronary artery disease, Diabetes, or Hypertension.  ROS:   Please see the history of present illness.    Generalized weakness.  Chronic pain. All other systems reviewed and are negative.   Labs/Other Tests and Data Reviewed:    Recent Labs: 01/23/2018: ALT 10; BUN 13; Creatinine, Ser 1.17; Hemoglobin 13.9; Platelets 135; Potassium 3.0; Sodium 140   Recent Lipid Panel Lab Results  Component Value Date/Time   CHOL 114 08/07/2017 10:12 AM   TRIG 129 08/07/2017 10:12 AM   HDL 36 (L) 08/07/2017 10:12 AM   CHOLHDL 3.2 08/07/2017 10:12 AM   LDLCALC 57 08/07/2017 10:12 AM    Wt Readings from Last 3 Encounters:  11/24/18 190 lb (86.2 kg)  08/02/18 189 lb 3.2 oz (85.8 kg)  05/11/18 206 lb (93.4 kg)     Objective:    Vital Signs:  Pulse (!) 57   Ht 5\' 11"  (1.803 m)   Wt 190 lb (86.2 kg)   SpO2 98%   BMI 26.50 kg/m    Patient reports blood pressure yesterday was 120/74.  ASSESSMENT & PLAN:    1.  Preprocedure cardiac evaluation in anticipation of left extracorporeal shockwave lithotripsy on April 6 by Dr. Gloriann Loan for treatment of symptomatic nephrolithiasis.  From a cardiac perspective, patient reports infrequent angina symptoms on medical therapy.  No obvious cardiac  arrhythmias.  Some recent issues with bradycardia, possibly symptomatic in terms of fatigue, but we are reducing his Coreg dose for now and he will continue to follow.  I do not anticipate any further cardiac testing prior to planned lithotripsy, he should be able to proceed at an acceptable cardiac risk.  I asked him to hold aspirin until after the procedure.  Otherwise continue with baseline cardiac regimen.  We will plan to see him back for a full visit in 3 months.  2.  Bradycardia, possibly symptomatic.  He was on an unusual dose of Coreg, this is been adjusted to 3.125 mg twice daily for now.  Continue to track heart rate and blood  pressure.  3.  Multivessel CAD status post CABG in 2008.  Last ischemic testing in 2016 was overall intermediate risk with mildly reduced LVEF but no active ischemia.  Plan to continue medical therapy for now.  Prescription sent in for as needed nitroglycerin.  4.  Essential hypertension, recent reported blood pressure was normal on Norvasc.  COVID-19 Education: The signs and symptoms of COVID-19 were discussed with the patient and how to seek care for testing (follow up with PCP or arrange E-visit).  The importance of social distancing was discussed today.  Patient Risk:   After full review of this patient's clinical status, I feel that they are at least moderate risk at this time.  Time:   Today, I have spent 9 minutes with the patient with telehealth technology discussing anticipated lithotripsy next week, current cardiac symptomatology and medications.     Medication Adjustments/Labs and Tests Ordered: Current medicines are reviewed at length with the patient today.  Concerns regarding medicines are outlined above.  Tests Ordered: No orders of the defined types were placed in this encounter.  Medication Changes: Meds ordered this encounter  Medications  . nitroGLYCERIN (NITROSTAT) 0.4 MG SL tablet    Sig: Place 1 tablet (0.4 mg total) under the tongue  every 5 (five) minutes as needed for chest pain.    Dispense:  25 tablet    Refill:  3    Disposition:  Follow up 3 months  Signed, Rozann Lesches, MD  11/24/2018 10:44 AM    [place]

## 2018-11-26 ENCOUNTER — Encounter (HOSPITAL_COMMUNITY): Payer: Self-pay | Admitting: *Deleted

## 2018-11-26 NOTE — Progress Notes (Addendum)
Spoke to patient via phone,history obtained,updated.  Bring blue folder,insurance cards,picture ID,designated driver and living will,POA, if desires (to be placed on chart). Reinforced no aspirin(instructions to hold aspirin per your doctor), ibuprofen products 72 hours prior to procedure. No vitamins or herbal medicines 7 days prior to procedure.   Follow laxative instructions provided by urologist (office) and in blue folder. Wear easy on/off clothing and no jewelry except wedding rings and ear rings. Leave all other valuables at home. Verbalizes understanding of instructions  No alcohol 24 hours prior to procedure.call md office if you have a cold,sorethroat or fever. Shower or bathe before your treatment.NPO after midnight except for norvasc and coreg with a sip of water. He verbalizes understanding.  Patient denies any symptoms of Covid 19. He is instructed that his family member will not be able to come into hospital. He verbalizes understanding.

## 2018-12-02 ENCOUNTER — Other Ambulatory Visit: Payer: Self-pay

## 2018-12-02 ENCOUNTER — Ambulatory Visit (HOSPITAL_COMMUNITY): Payer: Medicare HMO

## 2018-12-02 ENCOUNTER — Encounter (HOSPITAL_COMMUNITY)
Admission: RE | Disposition: A | Discharge status: Home / Self Care | Payer: Self-pay | Source: Home / Self Care | Attending: Urology

## 2018-12-02 ENCOUNTER — Ambulatory Visit (HOSPITAL_COMMUNITY)
Admission: RE | Admit: 2018-12-02 | Discharge: 2018-12-02 | Disposition: A | Discharge status: Home / Self Care | Payer: Medicare HMO | Attending: Urology | Admitting: Urology

## 2018-12-02 ENCOUNTER — Encounter (HOSPITAL_COMMUNITY): Payer: Self-pay | Admitting: General Practice

## 2018-12-02 DIAGNOSIS — I110 Hypertensive heart disease with heart failure: Secondary | ICD-10-CM | POA: Diagnosis not present

## 2018-12-02 DIAGNOSIS — I509 Heart failure, unspecified: Secondary | ICD-10-CM | POA: Diagnosis not present

## 2018-12-02 DIAGNOSIS — N201 Calculus of ureter: Secondary | ICD-10-CM | POA: Diagnosis present

## 2018-12-02 DIAGNOSIS — F17200 Nicotine dependence, unspecified, uncomplicated: Secondary | ICD-10-CM | POA: Diagnosis not present

## 2018-12-02 DIAGNOSIS — Z8673 Personal history of transient ischemic attack (TIA), and cerebral infarction without residual deficits: Secondary | ICD-10-CM | POA: Diagnosis not present

## 2018-12-02 HISTORY — PX: EXTRACORPOREAL SHOCK WAVE LITHOTRIPSY: SHX1557

## 2018-12-02 SURGERY — LITHOTRIPSY, ESWL
Anesthesia: LOCAL | Laterality: Left

## 2018-12-02 MED ORDER — DIPHENHYDRAMINE HCL 25 MG PO CAPS
25.0000 mg | ORAL_CAPSULE | ORAL | Status: AC
  Administered 2018-12-02: 10:00:00 25 mg via ORAL
  Filled 2018-12-02: qty 1

## 2018-12-02 MED ORDER — CIPROFLOXACIN HCL 500 MG PO TABS
500.0000 mg | ORAL_TABLET | ORAL | Status: AC
  Administered 2018-12-02: 500 mg via ORAL
  Filled 2018-12-02: qty 1

## 2018-12-02 MED ORDER — SODIUM CHLORIDE 0.9 % IV SOLN
INTRAVENOUS | Status: DC
  Administered 2018-12-02: 10:00:00 via INTRAVENOUS

## 2018-12-02 MED ORDER — DIAZEPAM 5 MG PO TABS
10.0000 mg | ORAL_TABLET | ORAL | Status: DC
  Filled 2018-12-02: qty 2

## 2018-12-02 NOTE — H&P (Signed)
See scanned H&P

## 2018-12-02 NOTE — Op Note (Signed)
See [facility] OP note scanned into chart. Also because of the size, density, location and other factors that cannot be anticipated I feel this will likely be a staged procedure. This fact supersedes any indication in the scanned [facility] operative note to the contrary.  

## 2018-12-06 ENCOUNTER — Encounter (HOSPITAL_COMMUNITY): Payer: Self-pay | Admitting: Urology

## 2019-01-07 ENCOUNTER — Other Ambulatory Visit: Payer: Self-pay | Admitting: Cardiology

## 2019-02-14 ENCOUNTER — Encounter: Payer: Self-pay | Admitting: Student in an Organized Health Care Education/Training Program

## 2019-02-14 ENCOUNTER — Telehealth: Payer: Self-pay | Admitting: *Deleted

## 2019-02-14 NOTE — Telephone Encounter (Signed)
Attemtped to call for pre appointment assessment. Unable to leave a voicemail, mailbox not set up.

## 2019-02-15 ENCOUNTER — Encounter: Payer: Self-pay | Admitting: Student in an Organized Health Care Education/Training Program

## 2019-02-15 ENCOUNTER — Encounter: Payer: Medicare HMO | Admitting: Nurse Practitioner

## 2019-02-15 ENCOUNTER — Other Ambulatory Visit: Payer: Self-pay

## 2019-02-15 ENCOUNTER — Ambulatory Visit
Payer: Medicare HMO | Attending: Nurse Practitioner | Admitting: Student in an Organized Health Care Education/Training Program

## 2019-02-15 DIAGNOSIS — Z79899 Other long term (current) drug therapy: Secondary | ICD-10-CM

## 2019-02-15 DIAGNOSIS — M47816 Spondylosis without myelopathy or radiculopathy, lumbar region: Secondary | ICD-10-CM

## 2019-02-15 DIAGNOSIS — M170 Bilateral primary osteoarthritis of knee: Secondary | ICD-10-CM

## 2019-02-15 DIAGNOSIS — M19012 Primary osteoarthritis, left shoulder: Secondary | ICD-10-CM

## 2019-02-15 DIAGNOSIS — M5136 Other intervertebral disc degeneration, lumbar region: Secondary | ICD-10-CM

## 2019-02-15 DIAGNOSIS — G8929 Other chronic pain: Secondary | ICD-10-CM

## 2019-02-15 DIAGNOSIS — M19011 Primary osteoarthritis, right shoulder: Secondary | ICD-10-CM

## 2019-02-15 DIAGNOSIS — M25512 Pain in left shoulder: Secondary | ICD-10-CM

## 2019-02-15 DIAGNOSIS — M25511 Pain in right shoulder: Secondary | ICD-10-CM

## 2019-02-15 DIAGNOSIS — G894 Chronic pain syndrome: Secondary | ICD-10-CM | POA: Diagnosis not present

## 2019-02-15 MED ORDER — OXYCODONE-ACETAMINOPHEN 10-325 MG PO TABS: 1.0000 | ORAL_TABLET | Freq: Three times a day (TID) | ORAL | 0 refills | Status: AC | PRN

## 2019-02-15 NOTE — Progress Notes (Signed)
Pain Management Virtual Encounter Note - Virtual Visit via Telephone Telehealth (real-time audio visits between healthcare provider and patient).   Patient's Phone No. & Preferred Pharmacy:  [phone] (home); [phone] (mobile); (Preferred) [phone] No e-mail address on record  [place], [place] - [address] [place] [address] Phone: [phone] Fax: [phone]    Pre-screening note:  Our staff contacted Mr. Fichera and offered him an "in person", "face-to-face" appointment versus a telephone encounter. He indicated preferring the telephone encounter, at this time.   Reason for Virtual Visit: COVID-19*  Social distancing based on CDC and AMA recommendations.   I contacted [name] on 02/15/2019 via telephone.      I clearly identified myself as [name], MD. I verified that I was speaking with the correct person using two identifiers (Name: DENI LEFEVER, and date of birth: 10-21-1945).  Advanced Informed Consent I sought verbal advanced consent from [name] for virtual visit interactions. I informed Mr. Helmuth of possible security and privacy concerns, risks, and limitations associated with providing "not-in-person" medical evaluation and management services. I also informed Mr. Flow of the availability of "in-person" appointments. Finally, I informed him that there would be a charge for the virtual visit and that he could be  personally, fully or partially, financially responsible for it. Mr. Gange expressed understanding and agreed to proceed.   Historic Elements   Mr. NIAM NEPOMUCENO is a 73 y.o. year old, male patient evaluated today after his last encounter by our practice on 02/14/2019. Mr. Baize  has a past medical history of Coronary atherosclerosis of native coronary artery, Dyspnea, History of kidney stones, HLD (hyperlipidemia), Hypertension, and Tobacco user. He also  has a past surgical history that includes Foot  surgery; Appendectomy; Knee Arthroplasty; Coronary artery bypass graft (2008); Rotator cuff repair; Shoulder surgery; Cholecystectomy (2003); Colonoscopy (N/A, 04/21/2018); polypectomy (04/21/2018); and Extracorporeal shock wave lithotripsy (Left, 12/02/2018). Mr. Skoda has a current medication list which includes the following prescription(s): amlodipine, aspirin ec, carvedilol, vitamin d3, citalopram, nitroglycerin, polyethylene glycol, rosuvastatin, trazodone, benazepril, furosemide, oxycodone-acetaminophen, oxycodone-acetaminophen, and oxycodone-acetaminophen. He  reports that he has been smoking cigarettes. He has a 57.00 pack-year smoking history. He has never used smokeless tobacco. He reports current alcohol use. He reports that he does not use drugs. Mr. Grow has No Known Allergies.   HPI  Today, he is being contacted for medication management.   No change in medical history since last visit.  Patient's pain is at baseline.  Patient continues multimodal pain regimen as prescribed.  States that it provides pain relief and improvement in functional status.  Pharmacotherapy Assessment   01/22/2019  2   11/18/2018  Oxycodone-Acetaminophen 10-325  90.00 30 Cr Kin   [id number]   [facility] (3484)   0  45.00 MME  Medicare   [place]     Monitoring: Pharmacotherapy: No side-effects or adverse reactions reported. [place] PMP: PDMP reviewed during this encounter.       Compliance: No problems identified. Effectiveness: Clinically acceptable. Plan: Refer to "POC".  Pertinent Labs   SAFETY SCREENING Profile No results found for: SARSCOV2NAA, COVIDSOURCE, STAPHAUREUS, MRSAPCR, HCVAB, HIV, PREGTESTUR Renal Function Lab Results  Component Value Date   BUN 13 01/23/2018   CREATININE 1.17 28/76/8115   BCR NOT APPLICABLE 72/62/0355   GFRAA >60 01/23/2018   GFRNONAA >60 01/23/2018   Hepatic Function Lab Results  Component Value Date   AST 14 (L) 01/23/2018   ALT 10 (  L) 01/23/2018   ALBUMIN 3.0 (L) 01/23/2018    UDS Summary  Date Value Ref Range Status  08/02/2018 FINAL  Final    Comment:    ==================================================================== TOXASSURE SELECT 13 (MW) ==================================================================== Test                             Result       Flag       Units Drug Present and Declared for Prescription Verification   Oxycodone                      3743         EXPECTED   ng/mg creat   Oxymorphone                    89           EXPECTED   ng/mg creat   Noroxycodone                   >4132        EXPECTED   ng/mg creat   Noroxymorphone                 108          EXPECTED   ng/mg creat    Sources of oxycodone are scheduled prescription medications.    Oxymorphone, noroxycodone, and noroxymorphone are expected    metabolites of oxycodone. Oxymorphone is also available as a    scheduled prescription medication. ==================================================================== Test                      Result    Flag   Units      Ref Range   Creatinine              242              mg/dL      >=20 ==================================================================== Declared Medications:  The flagging and interpretation on this report are based on the  following declared medications.  Unexpected results may arise from  inaccuracies in the declared medications.  **Note: The testing scope of this panel includes these medications:  Oxycodone (Percocet)  **Note: The testing scope of this panel does not include following  reported medications:  Acetaminophen (Percocet)  Amlodipine (Norvasc)  Aspirin (Aspirin 81)  Carvedilol (Coreg)  Citalopram (Celexa)  Polyethylene Glycol (MiraLAX)  Vitamin D3 ==================================================================== For clinical consultation, please call [phone]. ====================================================================    Note: Above Lab results reviewed.  Recent  imaging  DG Abd 1 View CLINICAL DATA:  Pre lithotripsy.  Left ureteral calculus.  EXAM: ABDOMEN - 1 VIEW  COMPARISON:  None.  FINDINGS: Stable 1.3 cm calculus in the left renal pelvis. There is no disproportionate dilatation of bowel. There is no obvious free intraperitoneal gas. Vascular calcifications are noted.  IMPRESSION: Left nephrolithiasis.  Nonobstructive bowel gas pattern.  Electronically Signed   By: Marybelle Killings M.D.   On: 12/02/2018 10:11  Assessment  The primary encounter diagnosis was Lumbar spondylosis. Diagnoses of Lumbar degenerative disc disease, Chronic pain syndrome, Primary osteoarthritis of both knees, Osteoarthritis of both shoulders, unspecified osteoarthritis type, Chronic prescription benzodiazepine use, and Chronic pain of both shoulders were also pertinent to this visit.  Plan of Care  I have discontinued Jerene Bears. Salton's oxyCODONE-acetaminophen. I am also having him start on oxyCODONE-acetaminophen, oxyCODONE-acetaminophen, and oxyCODONE-acetaminophen. Additionally, I am having him  maintain his citalopram, polyethylene glycol, Vitamin D3, aspirin EC, traZODone, amLODipine, rosuvastatin, nitroGLYCERIN, carvedilol, benazepril, and furosemide.  Pharmacotherapy (Medications Ordered): Meds ordered this encounter  Medications  . oxyCODONE-acetaminophen (PERCOCET) 10-325 MG tablet    Sig: Take 1 tablet by mouth every 8 (eight) hours as needed for up to 30 days for pain.    Dispense:  90 tablet    Refill:  0  . oxyCODONE-acetaminophen (PERCOCET) 10-325 MG tablet    Sig: Take 1 tablet by mouth every 8 (eight) hours as needed for up to 30 days for pain.    Dispense:  90 tablet    Refill:  0  . oxyCODONE-acetaminophen (PERCOCET) 10-325 MG tablet    Sig: Take 1 tablet by mouth every 8 (eight) hours as needed for up to 30 days for pain.    Dispense:  90 tablet    Refill:  0    Follow-up plan:   Return in about 3 months (around 05/18/2019) for Medication  Management.    Recent Visits Date Type Provider Dept  11/18/18 Telemedicine Vevelyn Francois, NP [facility]  Showing recent visits within past 90 days and meeting all other requirements   Today's Visits Date Type Provider Dept  02/15/19 Office Visit [name], MD [facility]  Showing today's visits and meeting all other requirements   Future Appointments No visits were found meeting these conditions.  Showing future appointments within next 90 days and meeting all other requirements   I discussed the assessment and treatment plan with the patient. The patient was provided an opportunity to ask questions and all were answered. The patient agreed with the plan and demonstrated an understanding of the instructions.  Patient advised to call back or seek an in-person evaluation if the symptoms or condition worsens.  Total duration of non-face-to-face encounter: 25 minutes.  Note by: [name], MD Date: 02/15/2019; Time: 11:15 AM  Note: This dictation was prepared with Dragon dictation. Any transcriptional errors that may result from this process are unintentional.  Disclaimer:  * Given the special circumstances of the COVID-19 pandemic, the federal government has announced that the Office for Civil Rights (OCR) will exercise its enforcement discretion and will not impose penalties on physicians using telehealth in the event of noncompliance with regulatory requirements under the [place] (HIPAA) in connection with the good faith provision of telehealth during the BCWUG-89 national public health emergency. ([place])

## 2019-03-04 ENCOUNTER — Encounter: Payer: Self-pay | Admitting: Cardiology

## 2019-03-04 ENCOUNTER — Telehealth (INDEPENDENT_AMBULATORY_CARE_PROVIDER_SITE_OTHER): Payer: Medicare HMO | Admitting: Cardiology

## 2019-03-04 VITALS — Ht 70.0 in

## 2019-03-04 DIAGNOSIS — Z7189 Other specified counseling: Secondary | ICD-10-CM | POA: Diagnosis not present

## 2019-03-04 DIAGNOSIS — E782 Mixed hyperlipidemia: Secondary | ICD-10-CM

## 2019-03-04 DIAGNOSIS — I25119 Atherosclerotic heart disease of native coronary artery with unspecified angina pectoris: Secondary | ICD-10-CM | POA: Diagnosis not present

## 2019-03-04 DIAGNOSIS — R001 Bradycardia, unspecified: Secondary | ICD-10-CM

## 2019-03-04 DIAGNOSIS — I10 Essential (primary) hypertension: Secondary | ICD-10-CM | POA: Diagnosis not present

## 2019-03-04 NOTE — Patient Instructions (Addendum)
Medication Instructions:   Your physician has recommended you make the following change in your medication:   Stop carvedilol 3.125 mg  Continue all other medications the same  Labwork:  NONE  Testing/Procedures:  NONE  Follow-Up:  Your physician recommends that you schedule a follow-up appointment in: 6 months. You will receive a reminder letter in the mail in about 4 months reminding you to call and schedule your appointment. If you don't receive this letter, please contact our office.  Any Other Special Instructions Will Be Listed Below (If Applicable).  If you need a refill on your cardiac medications before your next appointment, please call your pharmacy.

## 2019-03-04 NOTE — Progress Notes (Signed)
Virtual Visit via Telephone Note   This visit type was conducted due to national recommendations for restrictions regarding the COVID-19 Pandemic (e.g. social distancing) in an effort to limit this patient's exposure and mitigate transmission in our community.  Due to his co-morbid illnesses, this patient is at least at moderate risk for complications without adequate follow up.  This format is felt to be most appropriate for this patient at this time.  The patient did not have access to video technology/had technical difficulties with video requiring transitioning to audio format only (telephone).  All issues noted in this document were discussed and addressed.  No physical exam could be performed with this format.  Please refer to the patient's chart for his  consent to telehealth for [facility].   Date:  03/04/2019   ID:  Jack [name], DOB [birth year], MRN [id number]  Patient Location: Home Provider Location: Office  PCP:  Neale Burly, MD  Cardiologist:  Rozann Lesches, MD Electrophysiologist:  None   Evaluation Performed:  Follow-Up Visit  Chief Complaint:   Cardiac follow-up  History of Present Illness:    Jack [name] is a 73 y.o. male last assessed by telehealth encounter in April.  He did not have video access and we spoke by phone today.  He tells me that he underwent lithotripsy since I saw him, has been doing okay since then.  He does not report any chest pain.  Does feel fatigued.  No syncope.  At the last visit Coreg was adjusted to 3.125 mg twice daily.  He states that his heart rate can range anywhere between 39-55 beats per minute. Reports recent systolics in the 767M to 094B.  We went over his medications which are outlined below.  The patient does not have symptoms concerning for COVID-19 infection (fever, chills, cough, or new shortness of breath).  He tells me that he has been staying around the house mostly.   Past Medical History:  Diagnosis Date  .  Bradycardia   . Coronary atherosclerosis of native coronary artery    Multivessel status post CABG  . History of kidney stones   . HLD (hyperlipidemia)   . Hypertension   . Tobacco user    Past Surgical History:  Procedure Laterality Date  . APPENDECTOMY     age 34  . CHOLECYSTECTOMY  2003  . COLONOSCOPY N/A 04/21/2018   Procedure: COLONOSCOPY;  Surgeon: Rogene Houston, MD;  Location: [facility];  Service: Endoscopy;  Laterality: N/A;  12:00-rescheduled to 8/28 @ 10:30am per Lelon Frohlich  . CORONARY ARTERY BYPASS GRAFT  2008   LIMA to LAD, SVG to ramus, SVG to PDA and PLA  . EXTRACORPOREAL SHOCK WAVE LITHOTRIPSY Left 12/02/2018   Procedure: EXTRACORPOREAL SHOCK WAVE LITHOTRIPSY (ESWL);  Surgeon: Lucas Mallow, MD;  Location: [facility];  Service: Urology;  Laterality: Left;  . FOOT SURGERY    . KNEE ARTHROPLASTY    . POLYPECTOMY  04/21/2018   Procedure: POLYPECTOMY;  Surgeon: Rogene Houston, MD;  Location: [facility];  Service: Endoscopy;;  . ROTATOR CUFF REPAIR    . SHOULDER SURGERY       Current Meds  Medication Sig  . amLODipine (NORVASC) 5 MG tablet Take 5 mg by mouth every other day.   Marland Kitchen aspirin EC 81 MG tablet Take 1 tablet (81 mg total) by mouth daily.  . benazepril (LOTENSIN) 5 MG tablet Take 5 mg by mouth daily.   . Cholecalciferol (VITAMIN  D3) 1000 units CAPS Take 1,000 Units by mouth daily.  . citalopram (CELEXA) 40 MG tablet Take 1 tablet (40 mg total) by mouth daily.  . furosemide (LASIX) 20 MG tablet Take 20 mg by mouth daily.   . nitroGLYCERIN (NITROSTAT) 0.4 MG SL tablet Place 1 tablet (0.4 mg total) under the tongue every 5 (five) minutes as needed for chest pain.  Marland Kitchen oxyCODONE-acetaminophen (PERCOCET) 10-325 MG tablet Take 1 tablet by mouth every 8 (eight) hours as needed for up to 30 days for pain.  Derrill Memo ON 03/23/2019] oxyCODONE-acetaminophen (PERCOCET) 10-325 MG tablet Take 1 tablet by mouth every 8 (eight) hours as needed for up to 30 days for pain.  Derrill Memo ON 04/22/2019] oxyCODONE-acetaminophen (PERCOCET) 10-325 MG tablet Take 1 tablet by mouth every 8 (eight) hours as needed for up to 30 days for pain.  . polyethylene glycol (MIRALAX / GLYCOLAX) packet Take 17 g by mouth daily.   . rosuvastatin (CRESTOR) 20 MG tablet Take 20 mg by mouth daily.  . traZODone (DESYREL) 50 MG tablet Take 50 mg by mouth at bedtime.   . [DISCONTINUED] carvedilol (COREG) 3.125 MG tablet TAKE ONE TABLET BY MOUTH TWICE DAILY     Allergies:   Patient has no known allergies.   Social History   Tobacco Use  . Smoking status: Current Every Day Smoker    Packs/day: 1.00    Years: 57.00    Pack years: 57.00    Types: Cigarettes    Start date: 06/03/1959  . Smokeless tobacco: Never Used  Substance Use Topics  . Alcohol use: Yes    Alcohol/week: 0.0 standard drinks  . Drug use: No     Family Hx: The patient's family history includes Cerebral aneurysm in his father; Heart attack in his mother. There is no history of Coronary artery disease, Diabetes, or Hypertension.  ROS:   Please see the history of present illness. All other systems reviewed and are negative.   Prior CV studies:   The following studies were reviewed today:  Lexiscan Myoview 03/21/2015:  Findings consistent with prior myocardial infarction. Evidence of prior inferior infarct with no peri-infarct ischemia  This is an intermediate risk study. Intermediate risk due to mildly decreased LVEF, there is no current myocardium at jeopardy.  The left ventricular ejection fraction is mildly decreased (45-54%).  Labs/Other Tests and Data Reviewed:    EKG:  An ECG dated 11/14/2018 was personally reviewed today and demonstrated:  Sinus bradycardia at 45 bpm.  Recent Labs: 01/23/2018: ALT 10; BUN 13; Creatinine, Ser 1.17; Hemoglobin 13.9; Platelets 135; Potassium 3.0; Sodium 140   Recent Lipid Panel Lab Results  Component Value Date/Time   CHOL 114 08/07/2017 10:12 AM   TRIG 129 08/07/2017  10:12 AM   HDL 36 (L) 08/07/2017 10:12 AM   CHOLHDL 3.2 08/07/2017 10:12 AM   LDLCALC 57 08/07/2017 10:12 AM    Wt Readings from Last 3 Encounters:  12/02/18 187 lb 6.4 oz (85 kg)  11/24/18 190 lb (86.2 kg)  08/02/18 189 lb 3.2 oz (85.8 kg)     Objective:    Vital Signs:  Ht 5\' 10"  (1.778 m)   BMI 26.89 kg/m    He did not have a way to check vital signs today. Patient spoke in full sentences, not short of breath. No audible wheezing or coughing. Normal speech pattern.  ASSESSMENT & PLAN:    1.  Multivessel CAD status post CABG in 2009.  He does not report  any active angina at this time and will continue with medical therapy and observation.  2.  Bradycardia.  Discontinue Coreg altogether.  Continue to monitor.  3.  Essential hypertension, no changes in medications otherwise.  He remains on Norvasc and Lotensin.  4.  Mixed hyperlipidemia, on Crestor.  Last LDL 57.  COVID-19 Education: The signs and symptoms of COVID-19 were discussed with the patient and how to seek care for testing (follow up with PCP or arrange E-visit).  The importance of social distancing was discussed today.  Time:   Today, I have spent 8 minutes with the patient with telehealth technology discussing the above problems.     Medication Adjustments/Labs and Tests Ordered: Current medicines are reviewed at length with the patient today.  Concerns regarding medicines are outlined above.   Tests Ordered: No orders of the defined types were placed in this encounter.   Medication Changes: No orders of the defined types were placed in this encounter.   Follow Up:  In Person 6 months in the [place] office.  Signed, Rozann Lesches, MD  03/04/2019 10:20 AM    [place]

## 2019-04-19 ENCOUNTER — Encounter (INDEPENDENT_AMBULATORY_CARE_PROVIDER_SITE_OTHER): Payer: Self-pay | Admitting: *Deleted

## 2019-05-16 ENCOUNTER — Encounter: Payer: Self-pay | Admitting: Student in an Organized Health Care Education/Training Program

## 2019-05-17 ENCOUNTER — Other Ambulatory Visit: Payer: Self-pay

## 2019-05-17 ENCOUNTER — Encounter: Payer: Self-pay | Admitting: Student in an Organized Health Care Education/Training Program

## 2019-05-17 ENCOUNTER — Ambulatory Visit
Payer: Medicare HMO | Attending: Student in an Organized Health Care Education/Training Program | Admitting: Student in an Organized Health Care Education/Training Program

## 2019-05-17 NOTE — Progress Notes (Signed)
I attempted to call the patient however no response. No option for voicemail. Patient will need to reschedule. -Dr Holley Raring

## 2019-05-18 ENCOUNTER — Encounter: Payer: Medicare HMO | Admitting: Student in an Organized Health Care Education/Training Program

## 2019-05-20 ENCOUNTER — Other Ambulatory Visit: Payer: Self-pay | Admitting: Student in an Organized Health Care Education/Training Program

## 2019-05-23 ENCOUNTER — Other Ambulatory Visit: Payer: Self-pay

## 2019-05-23 ENCOUNTER — Ambulatory Visit
Payer: Medicare HMO | Attending: Student in an Organized Health Care Education/Training Program | Admitting: Student in an Organized Health Care Education/Training Program

## 2019-05-23 ENCOUNTER — Encounter: Payer: Self-pay | Admitting: Student in an Organized Health Care Education/Training Program

## 2019-05-23 DIAGNOSIS — M5136 Other intervertebral disc degeneration, lumbar region: Secondary | ICD-10-CM

## 2019-05-23 DIAGNOSIS — M25511 Pain in right shoulder: Secondary | ICD-10-CM | POA: Diagnosis not present

## 2019-05-23 DIAGNOSIS — G894 Chronic pain syndrome: Secondary | ICD-10-CM

## 2019-05-23 DIAGNOSIS — Z79891 Long term (current) use of opiate analgesic: Secondary | ICD-10-CM

## 2019-05-23 DIAGNOSIS — M51369 Other intervertebral disc degeneration, lumbar region without mention of lumbar back pain or lower extremity pain: Secondary | ICD-10-CM

## 2019-05-23 DIAGNOSIS — M47816 Spondylosis without myelopathy or radiculopathy, lumbar region: Secondary | ICD-10-CM

## 2019-05-23 DIAGNOSIS — M170 Bilateral primary osteoarthritis of knee: Secondary | ICD-10-CM

## 2019-05-23 DIAGNOSIS — M25512 Pain in left shoulder: Secondary | ICD-10-CM

## 2019-05-23 DIAGNOSIS — G8929 Other chronic pain: Secondary | ICD-10-CM

## 2019-05-23 MED ORDER — OXYCODONE-ACETAMINOPHEN 10-325 MG PO TABS: 1.0000 | ORAL_TABLET | Freq: Four times a day (QID) | ORAL | 0 refills | Status: AC | PRN

## 2019-05-23 MED ORDER — OXYCODONE-ACETAMINOPHEN 10-325 MG PO TABS: 1.0000 | ORAL_TABLET | Freq: Four times a day (QID) | ORAL | 0 refills | Status: DC | PRN

## 2019-05-23 NOTE — Progress Notes (Signed)
Pain Management Virtual Encounter Note - Virtual Visit via [place] (real-time audio visits between healthcare provider and patient).   Patient's Phone No. & Preferred Pharmacy:  [phone] (home); [phone] (mobile); (Preferred) [phone] No e-mail address on record  [place], [place] - [address] [place] [address] Phone: [phone] Fax: [phone]    Pre-screening note:  Our staff contacted Jack [name] and offered him an "in person", "face-to-face" appointment versus a telephone encounter. He indicated preferring the telephone encounter, at this time.   Reason for Virtual Visit: COVID-19*  Social distancing based on CDC and AMA recommendations.   I contacted Jack [name] on 05/23/2019 via video conference.      I clearly identified myself as Jack Santa, MD. I verified that I was speaking with the correct person using two identifiers (Name: Jack [name], and date of birth: 06/10/1946).  Advanced Informed Consent I sought verbal advanced consent from Jack [name] for virtual visit interactions. I informed Jack [name] of possible security and privacy concerns, risks, and limitations associated with providing "not-in-person" medical evaluation and management services. I also informed Jack [name] of the availability of "in-person" appointments. Finally, I informed him that there would be a charge for the virtual visit and that he could be  personally, fully or partially, financially responsible for it. Jack [name] expressed understanding and agreed to proceed.   Historic Elements   [name] is a 73 y.o. year old, male patient evaluated today after his last encounter by our practice on 05/20/2019. [name]  has a past medical history of Bradycardia, Coronary atherosclerosis of native coronary artery, History of kidney stones, HLD (hyperlipidemia), Hypertension, and Tobacco user. He also  has a past surgical history  that includes Foot surgery; Appendectomy; Knee Arthroplasty; Coronary artery bypass graft (2008); Rotator cuff repair; Shoulder surgery; Cholecystectomy (2003); Colonoscopy (N/A, 04/21/2018); polypectomy (04/21/2018); and Extracorporeal shock wave lithotripsy (Left, 12/02/2018). [name] has a current medication list which includes the following prescription(s): amlodipine, aspirin ec, benazepril, vitamin d3, citalopram, furosemide, nitroglycerin, oxycodone-acetaminophen, oxycodone-acetaminophen, polyethylene glycol, rosuvastatin, and trazodone. He  reports that he has been smoking cigarettes. He started smoking about 60 years ago. He has a 57.00 pack-year smoking history. He has never used smokeless tobacco. He reports current alcohol use. He reports that he does not use drugs. [name] has No Known Allergies.   HPI  Today, he is being contacted for medication management.   Did visit with Cardiology in interim.    Patient's pain is at baseline.  Patient continues multimodal pain regimen as prescribed.  States that it provides pain relief and improvement in functional status.   Pharmacotherapy Assessment  Analgesic: 04/22/2019  2   02/15/2019  Oxycodone-Acetaminophen 10-325  90.00  30 Bi Lat   [id number]   Mit (3484)   0  45.00 MME  Medicare      Monitoring: Pharmacotherapy: No side-effects or adverse reactions reported. [place] PMP: PDMP reviewed during this encounter.       Compliance: No problems identified. Effectiveness: Clinically acceptable. Plan: Refer to "POC".  UDS:  Summary  Date Value Ref Range Status  08/02/2018 FINAL  Final    Comment:    ==================================================================== TOXASSURE SELECT 13 (MW) ==================================================================== Test                             Result       Flag  Units Drug Present and Declared for Prescription Verification   Oxycodone                      3743         EXPECTED   ng/mg  creat   Oxymorphone                    89           EXPECTED   ng/mg creat   Noroxycodone                   >4132        EXPECTED   ng/mg creat   Noroxymorphone                 108          EXPECTED   ng/mg creat    Sources of oxycodone are scheduled prescription medications.    Oxymorphone, noroxycodone, and noroxymorphone are expected    metabolites of oxycodone. Oxymorphone is also available as a    scheduled prescription medication. ==================================================================== Test                      Result    Flag   Units      Ref Range   Creatinine              242              mg/dL      >=20 ==================================================================== Declared Medications:  The flagging and interpretation on this report are based on the  following declared medications.  Unexpected results may arise from  inaccuracies in the declared medications.  **Note: The testing scope of this panel includes these medications:  Oxycodone (Percocet)  **Note: The testing scope of this panel does not include following  reported medications:  Acetaminophen (Percocet)  Amlodipine (Norvasc)  Aspirin (Aspirin 81)  Carvedilol (Coreg)  Citalopram (Celexa)  Polyethylene Glycol (MiraLAX)  Vitamin D3 ==================================================================== For clinical consultation, please call [phone]. ====================================================================    Laboratory Chemistry Profile (12 mo)  Renal: No results found for requested labs within last 8760 hours.  Lab Results  Component Value Date   GFRAA >60 01/23/2018   GFRNONAA >60 01/23/2018   Hepatic: No results found for requested labs within last 8760 hours. Lab Results  Component Value Date   AST 14 (L) 01/23/2018   ALT 10 (L) 01/23/2018   Other: No results found for requested labs within last 8760 hours. Note: Above Lab results reviewed.   Assessment  The  primary encounter diagnosis was Lumbar spondylosis. Diagnoses of Lumbar degenerative disc disease, Chronic pain of both shoulders, Long term prescription opiate use, Primary osteoarthritis of both knees, and Chronic pain syndrome were also pertinent to this visit.  Plan of Care  I am having Jack [name] start on oxyCODONE-acetaminophen and oxyCODONE-acetaminophen. I am also having him maintain his citalopram, polyethylene glycol, Vitamin D3, aspirin EC, traZODone, amLODipine, rosuvastatin, nitroGLYCERIN, benazepril, and furosemide.  General Recommendations: The pain condition that the patient suffers from is best treated with a multidisciplinary approach that involves an increase in physical activity to prevent de-conditioning and worsening of the pain cycle, as well as psychological counseling (formal and/or informal) to address the co-morbid psychological affects of pain. Treatment will often involve judicious use of pain medications and interventional procedures to decrease the pain, allowing the patient to participate in the physical activity that will  ultimately produce long-lasting pain reductions. The goal of the multidisciplinary approach is to return the patient to a higher level of overall function and to restore their ability to perform activities of daily living.    Pharmacotherapy (Medications Ordered): Meds ordered this encounter  Medications  . oxyCODONE-acetaminophen (PERCOCET) 10-325 MG tablet    Sig: Take 1 tablet by mouth every 6 (six) hours as needed for pain. Must last 30 days.    Dispense:  90 tablet    Refill:  0    Chronic Pain. (STOP Act - Not applicable). Fill one day early if closed on scheduled refill date.  Marland Kitchen oxyCODONE-acetaminophen (PERCOCET) 10-325 MG tablet    Sig: Take 1 tablet by mouth every 6 (six) hours as needed for pain. Must last 30 days.    Dispense:  90 tablet    Refill:  0    Chronic Pain. (STOP Act - Not applicable). Fill one day early if closed on  scheduled refill date.   On ASA 81 mg, APAP prn. UDS at next visit. S/P Right IA glenohumeral joint injection- helped, can repeat if flare up  Follow-up plan:   Return in about 8 weeks (around 07/18/2019) for Medication Management, in person (UDS).    Recent Visits No visits were found meeting these conditions.  Showing recent visits within past 90 days and meeting all other requirements   Today's Visits Date Type Provider Dept  05/23/19 Office Visit Jack Santa, MD [facility]  Showing today's visits and meeting all other requirements   Future Appointments No visits were found meeting these conditions.  Showing future appointments within next 90 days and meeting all other requirements   I discussed the assessment and treatment plan with the patient. The patient was provided an opportunity to ask questions and all were answered. The patient agreed with the plan and demonstrated an understanding of the instructions.  Patient advised to call back or seek an in-person evaluation if the symptoms or condition worsens.  Total duration of non-face-to-face encounter: 25 minutes.  Note by: Jack Santa, MD Date: 05/23/2019; Time: 8:52 AM  Note: This dictation was prepared with Dragon dictation. Any transcriptional errors that may result from this process are unintentional.  Disclaimer:  * Given the special circumstances of the COVID-19 pandemic, the federal government has announced that the Office for Civil Rights (OCR) will exercise its enforcement discretion and will not impose penalties on physicians using telehealth in the event of noncompliance with regulatory requirements under the [place] and [place] (HIPAA) in connection with the good faith provision of telehealth during the XX123456 national public health emergency. ([place])

## 2019-07-14 ENCOUNTER — Other Ambulatory Visit: Payer: Self-pay

## 2019-07-14 ENCOUNTER — Encounter: Payer: Self-pay | Admitting: Student in an Organized Health Care Education/Training Program

## 2019-07-14 ENCOUNTER — Ambulatory Visit
Payer: Medicare HMO | Attending: Student in an Organized Health Care Education/Training Program | Admitting: Student in an Organized Health Care Education/Training Program

## 2019-07-14 VITALS — BP 103/70 | HR 82 | Temp 97.3°F | Resp 16 | Ht 68.0 in | Wt 187.0 lb

## 2019-07-14 DIAGNOSIS — M5136 Other intervertebral disc degeneration, lumbar region: Secondary | ICD-10-CM | POA: Diagnosis not present

## 2019-07-14 DIAGNOSIS — M25511 Pain in right shoulder: Secondary | ICD-10-CM | POA: Diagnosis not present

## 2019-07-14 DIAGNOSIS — G8929 Other chronic pain: Secondary | ICD-10-CM

## 2019-07-14 DIAGNOSIS — Z79891 Long term (current) use of opiate analgesic: Secondary | ICD-10-CM

## 2019-07-14 DIAGNOSIS — M47816 Spondylosis without myelopathy or radiculopathy, lumbar region: Secondary | ICD-10-CM | POA: Diagnosis not present

## 2019-07-14 DIAGNOSIS — M25512 Pain in left shoulder: Secondary | ICD-10-CM

## 2019-07-14 DIAGNOSIS — M170 Bilateral primary osteoarthritis of knee: Secondary | ICD-10-CM

## 2019-07-14 DIAGNOSIS — G894 Chronic pain syndrome: Secondary | ICD-10-CM

## 2019-07-14 MED ORDER — OXYCODONE-ACETAMINOPHEN 10-325 MG PO TABS: 1.0000 | ORAL_TABLET | Freq: Four times a day (QID) | ORAL | 0 refills | Status: AC | PRN

## 2019-07-14 MED ORDER — OXYCODONE-ACETAMINOPHEN 10-325 MG PO TABS: 1.0000 | ORAL_TABLET | Freq: Four times a day (QID) | ORAL | 0 refills | Status: DC | PRN

## 2019-07-14 NOTE — Progress Notes (Signed)
Patient's Name: Jack [name]  MRN: [id number]  Referring Provider: Neale Burly, MD  DOB: 1946/05/08  PCP: Neale Burly, MD  DOS: 07/14/2019  Note by: [name], MD  Service setting: Ambulatory outpatient  Attending: [name], MD  Location: [facility] (AMB) Pain Management Facility  Specialty: Interventional Pain Management  Patient type: Established   Primary Reason(s) for Visit: Encounter for prescription drug management. (Level of risk: moderate)  CC: Leg Pain (bilateral, lower) and Back Pain (upper)  HPI  Jack [name] is a 73 y.o. year old, male patient, who comes today for a medication management evaluation. He has Hyperlipidemia LDL goal <70; Tobacco abuse; Essential hypertension, benign; Coronary atherosclerosis of native coronary artery; Hypertension; Osteoarthritis of both shoulders; Lumbar spondylosis; Lumbar degenerative disc disease; Chronic pain of both shoulders; Chronic pain syndrome; Diarrhea; and Guaiac positive stools on their problem list. His primarily concern today is the Leg Pain (bilateral, lower) and Back Pain (upper)  Pain Assessment: Location: Right, Left, Lower Leg Radiating:   Onset: More than a month ago Duration: Chronic pain Quality: Constant Severity: 7 /10 (subjective, self-reported pain score)  Effect on ADL:   Timing: Constant Modifying factors: medications BP: 103/70  HR: 82  Jack [name] was last scheduled for an appointment on 05/23/2019 for medication management. During today's appointment we reviewed Jack [name]'s chronic pain status, as well as his outpatient medication regimen.  No change in medical history since last visit.  Patient's pain is at baseline.  Patient continues multimodal pain regimen as prescribed.  States that it provides pain relief and improvement in functional status.  States that he does have intermittent pain in between his intrascapular region but believes that is due to him sleeping incorrectly.  Still doing well after  his right shoulder steroid injection and knee Hyalgan.  Does not feel that he needs to repeat that.  We will refill medications as below.  We will also complete annual urine toxicology screen for medication compliance monitoring.  The patient  reports no history of drug use. His body mass index is 28.43 kg/m.  Further details on both, my assessment(s), as well as the proposed treatment plan, please see below.  Controlled Substance Pharmacotherapy Assessment REMS (Risk Evaluation and Mitigation Strategy)  Analgesic: 06/22/2019  2   05/23/2019  Oxycodone-Acetaminophen 10-325  90.00  30 Bi Lat   [id number]   Mit (3484)   0  45.00 MME  Medicare   [place]   Pharmacokinetics: Liberation and absorption (onset of action): WNL Distribution (time to peak effect): WNL Metabolism and excretion (duration of action): WNL         Pharmacodynamics: Desired effects: Analgesia: Jack [name] reports >50% benefit. Functional ability: Patient reports that medication allows him to accomplish basic ADLs Clinically meaningful improvement in function (CMIF): Sustained CMIF goals met Perceived effectiveness: Described as relatively effective, allowing for increase in activities of daily living (ADL) Undesirable effects: Side-effects or Adverse reactions: None reported Monitoring: [place] PMP: PDMP not reviewed this encounter. Online review of the past 29-monthperiod conducted. Compliant with practice rules and regulations Last UDS on record: Summary  Date Value Ref Range Status  08/02/2018 FINAL  Final    Comment:    ==================================================================== TOXASSURE SSELECT 98(MW) ==================================================================== Test                             Result       Flag  Units Drug Present and Declared for Prescription Verification   Oxycodone                      3743         EXPECTED   ng/mg creat   Oxymorphone                    89           EXPECTED    ng/mg creat   Noroxycodone                   >4132        EXPECTED   ng/mg creat   Noroxymorphone                 108          EXPECTED   ng/mg creat    Sources of oxycodone are scheduled prescription medications.    Oxymorphone, noroxycodone, and noroxymorphone are expected    metabolites of oxycodone. Oxymorphone is also available as a    scheduled prescription medication. ==================================================================== Test                      Result    Flag   Units      Ref Range   Creatinine              242              mg/dL      >=20 ==================================================================== Declared Medications:  The flagging and interpretation on this report are based on the  following declared medications.  Unexpected results may arise from  inaccuracies in the declared medications.  **Note: The testing scope of this panel includes these medications:  Oxycodone (Percocet)  **Note: The testing scope of this panel does not include following  reported medications:  Acetaminophen (Percocet)  Amlodipine (Norvasc)  Aspirin (Aspirin 81)  Carvedilol (Coreg)  Citalopram (Celexa)  Polyethylene Glycol (MiraLAX)  Vitamin D3 ==================================================================== For clinical consultation, please call [phone]. ====================================================================    UDS interpretation: Compliant          Medication Assessment Form: Reviewed. Patient indicates being compliant with therapy Treatment compliance: Compliant Risk Assessment Profile: Aberrant behavior: See initial evaluations. None observed or detected today Comorbid factors increasing risk of overdose: See initial evaluation. No additional risks detected today Opioid risk tool (ORT):  Opioid Risk  08/02/2018  Alcohol 0  Illegal Drugs 0  Rx Drugs 0  Alcohol 0  Illegal Drugs 0  Rx Drugs 0  Age between 16-45 years  0  History of  Preadolescent Sexual Abuse 0  Psychological Disease 0  ADD -  OCD -  Bipolar -  Depression 0  Opioid Risk Tool Scoring 0  Opioid Risk Interpretation Low Risk    ORT Scoring interpretation table:  Score <3 = Low Risk for SUD  Score between 4-7 = Moderate Risk for SUD  Score >8 = High Risk for Opioid Abuse   Risk of substance use disorder (SUD): Low  Risk Mitigation Strategies:  Patient Counseling: Covered Patient-Prescriber Agreement (PPA): Present and active  Notification to other healthcare providers: Done  Pharmacologic Plan: No change in therapy, at this time.             Laboratory Chemistry Profile   Renal Lab Results  Component Value Date   BUN 13 01/23/2018   CREATININE 1.17 48/54/6270   BCR NOT APPLICABLE 35/00/9381  GFRAA >60 01/23/2018   GFRNONAA >60 01/23/2018                             Hepatic Lab Results  Component Value Date   AST 14 (L) 01/23/2018   ALT 10 (L) 01/23/2018   ALBUMIN 3.0 (L) 01/23/2018   ALKPHOS 52 01/23/2018   LIPASE 20 01/23/2018                        Electrolytes Lab Results  Component Value Date   NA 140 01/23/2018   K 3.0 (L) 01/23/2018   CL 103 01/23/2018   CALCIUM 8.6 (L) 01/23/2018                        Neuropathy Lab Results  Component Value Date   HGBA1C 5.2 05/06/2017                        Bone Lab Results  Component Value Date   VD25OH 47 05/06/2017                         Coagulation Lab Results  Component Value Date   INR 1.06 01/23/2018   LABPROT 13.8 01/23/2018   PLT 135 (L) 01/23/2018                        Cardiovascular Lab Results  Component Value Date   HGB 13.9 01/23/2018   HCT 42.1 01/23/2018                          Endocrine Lab Results  Component Value Date   TSH 2.39 05/06/2017                        Note: Lab results reviewed.  Recent Diagnostic Imaging Results  DG Abd 1 View CLINICAL DATA:  Pre lithotripsy.  Left ureteral calculus.  EXAM: ABDOMEN - 1  VIEW  COMPARISON:  None.  FINDINGS: Stable 1.3 cm calculus in the left renal pelvis. There is no disproportionate dilatation of bowel. There is no obvious free intraperitoneal gas. Vascular calcifications are noted.  IMPRESSION: Left nephrolithiasis.  Nonobstructive bowel gas pattern.  Electronically Signed   By: Marybelle Killings M.D.   On: 12/02/2018 10:11  Complexity Note: Imaging results reviewed. Results shared with Jack [name], using Layman's terms.                               Meds   Current Outpatient Medications:  .  amLODipine (NORVASC) 5 MG tablet, Take 5 mg by mouth every other day. , Disp: , Rfl:  .  aspirin EC 81 MG tablet, Take 1 tablet (81 mg total) by mouth daily., Disp: 90 tablet, Rfl: 3 .  benazepril (LOTENSIN) 5 MG tablet, Take 5 mg by mouth daily. , Disp: , Rfl:  .  Cholecalciferol (VITAMIN D3) 1000 units CAPS, Take 1,000 Units by mouth daily., Disp: , Rfl:  .  citalopram (CELEXA) 40 MG tablet, Take 1 tablet (40 mg total) by mouth daily., Disp: 90 tablet, Rfl: 1 .  furosemide (LASIX) 20 MG tablet, Take 20 mg by mouth daily. , Disp: , Rfl:  .  nitroGLYCERIN (NITROSTAT) 0.4 MG  SL tablet, Place 1 tablet (0.4 mg total) under the tongue every 5 (five) minutes as needed for chest pain., Disp: 25 tablet, Rfl: 3 .  [START ON 07/22/2019] oxyCODONE-acetaminophen (PERCOCET) 10-325 MG tablet, Take 1 tablet by mouth every 6 (six) hours as needed for pain. Must last 30 days., Disp: 90 tablet, Rfl: 0 .  [START ON 08/21/2019] oxyCODONE-acetaminophen (PERCOCET) 10-325 MG tablet, Take 1 tablet by mouth every 6 (six) hours as needed for pain. Must last 30 days., Disp: 90 tablet, Rfl: 0 .  [START ON 09/20/2019] oxyCODONE-acetaminophen (PERCOCET) 10-325 MG tablet, Take 1 tablet by mouth every 6 (six) hours as needed for pain. Must last 30 days., Disp: 90 tablet, Rfl: 0 .  polyethylene glycol (MIRALAX / GLYCOLAX) packet, Take 17 g by mouth daily. , Disp: , Rfl:  .  rosuvastatin (CRESTOR)  20 MG tablet, Take 20 mg by mouth daily., Disp: , Rfl:  .  traZODone (DESYREL) 50 MG tablet, Take 50 mg by mouth at bedtime. , Disp: , Rfl:   ROS  Constitutional: Denies any fever or chills Gastrointestinal: No reported hemesis, hematochezia, vomiting, or acute GI distress Musculoskeletal: Denies any acute onset joint swelling, redness, loss of ROM, or weakness Neurological: No reported episodes of acute onset apraxia, aphasia, dysarthria, agnosia, amnesia, paralysis, loss of coordination, or loss of consciousness  Allergies  Jack [name] has No Known Allergies.  [place]  Drug: Jack [name]  reports no history of drug use. Alcohol:  reports current alcohol use. Tobacco:  reports that he has been smoking cigarettes. He started smoking about 60 years ago. He has a 57.00 pack-year smoking history. He has never used smokeless tobacco. Medical:  has a past medical history of Bradycardia, Coronary atherosclerosis of native coronary artery, History of kidney stones, HLD (hyperlipidemia), Hypertension, and Tobacco user. Surgical: Jack [name]  has a past surgical history that includes Foot surgery; Appendectomy; Knee Arthroplasty; Coronary artery bypass graft (2008); Rotator cuff repair; Shoulder surgery; Cholecystectomy (2003); Colonoscopy (N/A, 04/21/2018); polypectomy (04/21/2018); and Extracorporeal shock wave lithotripsy (Left, 12/02/2018). Family: family history includes Cerebral aneurysm in his father; Heart attack in his mother.  Constitutional Exam  General appearance: Well nourished, well developed, and well hydrated. In no apparent acute distress Vitals:   07/14/19 1210  BP: 103/70  Pulse: 82  Resp: 16  Temp: (!) 97.3 F (36.3 C)  TempSrc: Temporal  SpO2: 98%  Weight: 187 lb (84.8 kg)  Height: 5' 8" (1.727 m)   BMI Assessment: Estimated body mass index is 28.43 kg/m as calculated from the following:   Height as of this encounter: 5' 8" (1.727 m).   Weight as of this encounter: 187 lb  (84.8 kg).  BMI interpretation table: BMI level Category Range association with higher incidence of chronic pain  <18 kg/m2 Underweight   18.5-24.9 kg/m2 Ideal body weight   25-29.9 kg/m2 Overweight Increased incidence by 20%  30-34.9 kg/m2 Obese (Class I) Increased incidence by 68%  35-39.9 kg/m2 Severe obesity (Class II) Increased incidence by 136%  >40 kg/m2 Extreme obesity (Class III) Increased incidence by 254%   Patient's current BMI Ideal Body weight  Body mass index is 28.43 kg/m. Ideal body weight: 68.4 kg (150 lb 12.7 oz) Adjusted ideal body weight: 75 kg (165 lb 4.4 oz)   BMI Readings from Last 4 Encounters:  07/14/19 28.43 kg/m  03/04/19 26.89 kg/m  12/02/18 26.14 kg/m  11/24/18 26.50 kg/m   Wt Readings from Last 4 Encounters:  07/14/19 187 lb (84.8 kg)  12/02/18 187 lb 6.4 oz (85 kg)  11/24/18 190 lb (86.2 kg)  08/02/18 189 lb 3.2 oz (85.8 kg)  Psych/Mental status: Alert, oriented x 3 (person, place, & time)       Eyes: PERLA Respiratory: No evidence of acute respiratory distress  Cervical Spine Area Exam  Skin & Axial Inspection: No masses, redness, edema, swelling, or associated skin lesions Alignment: Symmetrical Functional ROM: Decreased ROM      Stability: No instability detected Muscle Tone/Strength: Functionally intact. No obvious neuro-muscular anomalies detected. Sensory (Neurological): Musculoskeletal pain pattern Palpation: No palpable anomalies              Upper Extremity (UE) Exam    Side: Right upper extremity  Side: Left upper extremity  Skin & Extremity Inspection: Skin color, temperature, and hair growth are WNL. No peripheral edema or cyanosis. No masses, redness, swelling, asymmetry, or associated skin lesions. No contractures.  Skin & Extremity Inspection: Skin color, temperature, and hair growth are WNL. No peripheral edema or cyanosis. No masses, redness, swelling, asymmetry, or associated skin lesions. No contractures.  Functional  ROM: Decreased ROM for shoulder and elbow  Functional ROM: Decreased ROM for shoulder and elbow  Muscle Tone/Strength: Functionally intact. No obvious neuro-muscular anomalies detected.  Muscle Tone/Strength: Functionally intact. No obvious neuro-muscular anomalies detected.  Sensory (Neurological): Musculoskeletal pain pattern          Sensory (Neurological): Musculoskeletal pain pattern          Palpation: No palpable anomalies              Palpation: No palpable anomalies              Provocative Test(s):  Phalen's test: deferred Tinel's test: deferred Apley's scratch test (touch opposite shoulder):  Action 1 (Across chest): Decreased ROM Action 2 (Overhead): Decreased ROM Action 3 (LB reach): Decreased ROM   Provocative Test(s):  Phalen's test: deferred Tinel's test: deferred Apley's scratch test (touch opposite shoulder):  Action 1 (Across chest): Decreased ROM Action 2 (Overhead): Decreased ROM Action 3 (LB reach): Decreased ROM    Thoracic Spine Area Exam  Skin & Axial Inspection: No masses, redness, or swelling Alignment: Symmetrical Functional ROM: Unrestricted ROM Stability: No instability detected Muscle Tone/Strength: Functionally intact. No obvious neuro-muscular anomalies detected. Sensory (Neurological): Unimpaired Muscle strength & Tone: No palpable anomalies  Lumbar Spine Area Exam  Skin & Axial Inspection: No masses, redness, or swelling Alignment: Symmetrical Functional ROM: Decreased ROM       Stability: No instability detected Muscle Tone/Strength: Functionally intact. No obvious neuro-muscular anomalies detected. Sensory (Neurological): Musculoskeletal pain pattern   Gait & Posture Assessment  Ambulation: Patient came in today in a wheel chair Gait: Limited. Using assistive device to ambulate Posture: Difficulty standing up straight, due to pain   Lower Extremity Exam    Side: Right lower extremity  Side: Left lower extremity  Stability: No  instability observed          Stability: No instability observed          Skin & Extremity Inspection: Skin color, temperature, and hair growth are WNL. No peripheral edema or cyanosis. No masses, redness, swelling, asymmetry, or associated skin lesions. No contractures.  Skin & Extremity Inspection: Skin color, temperature, and hair growth are WNL. No peripheral edema or cyanosis. No masses, redness, swelling, asymmetry, or associated skin lesions. No contractures.  Functional ROM: Decreased ROM for all joints of the lower extremity  Functional ROM: Decreased ROM for all joints of the lower extremity          Muscle Tone/Strength: Mild-to-moderate deconditioning  Muscle Tone/Strength: Mild-to-moderate deconditioning  Sensory (Neurological): Musculoskeletal pain pattern        Sensory (Neurological): Musculoskeletal pain pattern        DTR: Patellar: deferred today Achilles: deferred today Plantar: deferred today  DTR: Patellar: deferred today Achilles: deferred today Plantar: deferred today  Palpation: No palpable anomalies  Palpation: No palpable anomalies   Assessment   Status Diagnosis  Controlled Controlled Controlled 1. Lumbar spondylosis   2. Lumbar degenerative disc disease   3. Chronic pain of both shoulders   4. Primary osteoarthritis of both knees   5. Long term prescription opiate use   6. Chronic pain syndrome       Plan of Care  Pharmacotherapy (Medications Ordered): Meds ordered this encounter  Medications  . oxyCODONE-acetaminophen (PERCOCET) 10-325 MG tablet    Sig: Take 1 tablet by mouth every 6 (six) hours as needed for pain. Must last 30 days.    Dispense:  90 tablet    Refill:  0    Chronic Pain. (STOP Act - Not applicable). Fill one day early if closed on scheduled refill date.  Marland Kitchen oxyCODONE-acetaminophen (PERCOCET) 10-325 MG tablet    Sig: Take 1 tablet by mouth every 6 (six) hours as needed for pain. Must last 30 days.    Dispense:  90 tablet     Refill:  0    Chronic Pain. (STOP Act - Not applicable). Fill one day early if closed on scheduled refill date.  Marland Kitchen oxyCODONE-acetaminophen (PERCOCET) 10-325 MG tablet    Sig: Take 1 tablet by mouth every 6 (six) hours as needed for pain. Must last 30 days.    Dispense:  90 tablet    Refill:  0    Chronic Pain. (STOP Act - Not applicable). Fill one day early if closed on scheduled refill date.   Medications administered today: Jack [name] had no medications administered during this visit.  Orders:  Orders Placed This Encounter  Procedures  . UDS (Compliance-13) ([facility]) ([facility]) (Established Pt.)    Volume: 30 ml(s). Minimum 3 ml of urine is needed. Document temperature of fresh sample. Indications: Long term (current) use of opiate analgesic (Z79.891)    Lab Orders     UDS (Compliance-13) ([facility]) ([facility]) (Established Pt.)  Planned follow-up:   Return in about 3 months (around 10/14/2019) for Medication Management, virtual.     Recent Visits Date Type Provider Dept  05/23/19 Office Visit [name], MD [facility]  Showing recent visits within past 90 days and meeting all other requirements   Today's Visits Date Type Provider Dept  07/14/19 Office Visit [name], MD [facility]  Showing today's visits and meeting all other requirements   Future Appointments No visits were found meeting these conditions.  Showing future appointments within next 90 days and meeting all other requirements   Primary Care Physician: Neale Burly, MD Location: [facility] Note by: [name], MD Date: 07/14/2019; Time: 12:28 PM  Note: This dictation was prepared with Dragon dictation. Any transcriptional errors that may result from this process are unintentional.

## 2019-07-14 NOTE — Patient Instructions (Signed)
Three prescriptions for Percocet have been sent to your pharmacy. 

## 2019-07-14 NOTE — Progress Notes (Signed)
Nursing Pain Medication Assessment:  Safety precautions to be maintained throughout the outpatient stay will include: orient to surroundings, keep bed in low position, maintain call bell within reach at all times, provide assistance with transfer out of bed and ambulation.  Medication Inspection Compliance: Pill count conducted under aseptic conditions, in front of the patient. Neither the pills nor the bottle was removed from the patient's sight at any time. Once count was completed pills were immediately returned to the patient in their original bottle.  Medication: Oxycodone/APAP Pill/Patch Count: 24 of 90 pills remain Pill/Patch Appearance: Markings consistent with prescribed medication Bottle Appearance: Standard pharmacy container. Clearly labeled. Filled Date: 10/28/ 2020 Last Medication intake:  Today

## 2019-07-18 LAB — TOXASSURE SELECT 13 (MW), URINE

## 2019-10-12 ENCOUNTER — Encounter: Payer: Self-pay | Admitting: Student in an Organized Health Care Education/Training Program

## 2019-10-13 ENCOUNTER — Ambulatory Visit
Payer: Medicare HMO | Attending: Student in an Organized Health Care Education/Training Program | Admitting: Student in an Organized Health Care Education/Training Program

## 2019-10-13 ENCOUNTER — Other Ambulatory Visit: Payer: Self-pay

## 2019-10-13 ENCOUNTER — Encounter: Payer: Self-pay | Admitting: Student in an Organized Health Care Education/Training Program

## 2019-10-13 DIAGNOSIS — M19011 Primary osteoarthritis, right shoulder: Secondary | ICD-10-CM

## 2019-10-13 DIAGNOSIS — M19012 Primary osteoarthritis, left shoulder: Secondary | ICD-10-CM

## 2019-10-13 DIAGNOSIS — M47816 Spondylosis without myelopathy or radiculopathy, lumbar region: Secondary | ICD-10-CM

## 2019-10-13 DIAGNOSIS — M5136 Other intervertebral disc degeneration, lumbar region: Secondary | ICD-10-CM

## 2019-10-13 DIAGNOSIS — G894 Chronic pain syndrome: Secondary | ICD-10-CM

## 2019-10-13 DIAGNOSIS — M170 Bilateral primary osteoarthritis of knee: Secondary | ICD-10-CM

## 2019-10-13 DIAGNOSIS — Z79891 Long term (current) use of opiate analgesic: Secondary | ICD-10-CM | POA: Diagnosis not present

## 2019-10-13 MED ORDER — OXYCODONE-ACETAMINOPHEN 10-325 MG PO TABS: 1.0000 | ORAL_TABLET | Freq: Four times a day (QID) | ORAL | 0 refills | Status: AC | PRN

## 2019-10-13 MED ORDER — OXYCODONE-ACETAMINOPHEN 10-325 MG PO TABS: 1.0000 | ORAL_TABLET | Freq: Four times a day (QID) | ORAL | 0 refills | Status: DC | PRN

## 2019-10-13 NOTE — Progress Notes (Signed)
Patient: Jack [name]  Service Category: E/M  Provider: [name], MD  DOB: 01-20-1946  DOS: 10/13/2019  Location: Office  MRN: [id number]  Setting: Ambulatory outpatient  Referring Provider: Neale Burly, MD  Type: Established Patient  Specialty: Interventional Pain Management  PCP: Neale Burly, MD  Location: Home  Delivery: TeleHealth     Virtual Encounter - Pain Management PROVIDER NOTE: Information contained herein reflects review and annotations entered in association with encounter. Interpretation of such information and data should be left to medically-trained personnel. Information provided to patient can be located elsewhere in the medical record under "Patient Instructions". Document created using STT-dictation technology, any transcriptional errors that may result from process are unintentional.    Contact & Pharmacy Preferred: [phone] Home: [phone] (home) Mobile: [phone] (mobile) E-mail: No e-mail address on record  [place], [place] [address] [place] [address] Phone: [phone] Fax: [phone]   Pre-screening  Jack [name] offered "in-person" vs "virtual" encounter. He indicated preferring virtual for this encounter.   Reason COVID-19*  Social distancing based on CDC and AMA recommendations.   I contacted Jack [name] on 10/13/2019 via telephone.      I clearly identified myself as [name], MD. I verified that I was speaking with the correct person using two identifiers (Name: Jack [name], and date of birth: Jan 14, 1946).  This visit was completed via telephone due to the restrictions of the COVID-19 pandemic. All issues as above were discussed and addressed but no physical exam was performed. If it was felt that the patient should be evaluated in the office, they were directed there. The patient verbally consented to this visit. Patient was unable to complete an audio/visual visit due to Technical  difficulties and/or Lack of internet. Due to the catastrophic nature of the COVID-19 pandemic, this visit was done through audio contact only.  Location of the patient: home address (see Epic for details)  Location of the provider: office  Consent I sought verbal advanced consent from Jack [name] for virtual visit interactions. I informed Jack [name] of possible security and privacy concerns, risks, and limitations associated with providing "not-in-person" medical evaluation and management services. I also informed Jack [name] of the availability of "in-person" appointments. Finally, I informed him that there would be a charge for the virtual visit and that he could be  personally, fully or partially, financially responsible for it. Jack [name] expressed understanding and agreed to proceed.   Historic Elements   Jack [name] is a 74 y.o. year old, male patient evaluated today after his last contact with our practice on 07/14/2019. Jack [name]  has a past medical history of Bradycardia, Coronary atherosclerosis of native coronary artery, History of kidney stones, HLD (hyperlipidemia), Hypertension, and Tobacco user. He also  has a past surgical history that includes Foot surgery; Appendectomy; Knee Arthroplasty; Coronary artery bypass graft (2008); Rotator cuff repair; Shoulder surgery; Cholecystectomy (2003); Colonoscopy (N/A, 04/21/2018); polypectomy (04/21/2018); and Extracorporeal shock wave lithotripsy (Left, 12/02/2018). Jack [name] has a current medication list which includes the following prescription(s): amlodipine, aspirin ec, benazepril, vitamin d3, citalopram, furosemide, nitroglycerin, [START ON 10/20/2019] oxycodone-acetaminophen, [START ON 11/19/2019] oxycodone-acetaminophen, [START ON 12/19/2019] oxycodone-acetaminophen, polyethylene glycol, rosuvastatin, and trazodone. He  reports that he has been smoking cigarettes. He started smoking about 60 years ago. He has a 57.00 pack-year smoking  history. He has never used smokeless tobacco. He reports current alcohol use. He reports that he does  not use drugs. Jack [name] has No Known Allergies.   HPI  Today, he is being contacted for medication management.   No change in medical history since last visit.  Patient's pain is at baseline.  Patient continues multimodal pain regimen as prescribed.  States that it provides pain relief and improvement in functional status.  Pharmacotherapy Assessment  Analgesic:  09/20/2019  2   07/14/2019  Oxycodone-Acetaminophen 10-325  90.00  30 Bi Lat   [id number]   Mit (3484)   0  45.00 MME  Medicare   [place]    Monitoring: [place] PMP: PDMP reviewed during this encounter.       Pharmacotherapy: No side-effects or adverse reactions reported. Compliance: No problems identified. Effectiveness: Clinically acceptable. Plan: Refer to "POC".  UDS:  Summary  Date Value Ref Range Status  07/14/2019 Note  Final    Comment:    ==================================================================== ToxASSURE Select 13 (MW) ==================================================================== Test                             Result       Flag       Units Drug Present not Declared for Prescription Verification   Oxycodone                      2795         UNEXPECTED ng/mg creat   Oxymorphone                    234          UNEXPECTED ng/mg creat   Noroxycodone                   7424         UNEXPECTED ng/mg creat   Noroxymorphone                 92           UNEXPECTED ng/mg creat    Sources of oxycodone are scheduled prescription medications.    Oxymorphone, noroxycodone, and noroxymorphone are expected    metabolites of oxycodone. Oxymorphone is also available as a    scheduled prescription medication. Drug Absent but Declared for Prescription Verification   Hydrocodone                    Not Detected UNEXPECTED ng/mg creat ==================================================================== Test                       Result    Flag   Units      Ref Range   Creatinine              59               mg/dL      >=20 ==================================================================== Declared Medications:  The flagging and interpretation on this report are based on the  following declared medications.  Unexpected results may arise from  inaccuracies in the declared medications.  **Note: The testing scope of this panel includes these medications:  Hydrocodone  **Note: The testing scope of this panel does not include the  following reported medications:  Acetaminophen  Amlodipine  Aspirin  Benazepril  Cholecalciferol  Citalopram  Furosemide  Nitroglycerin  Polyethylene Glycol  Rosuvastatin (Crestor)  Trazodone ==================================================================== For clinical consultation, please call [phone]. ====================================================================    Laboratory Chemistry Profile   Renal Lab Results  Component Value Date   BUN 13 01/23/2018   CREATININE 1.17 01/75/1025   BCR NOT APPLICABLE 85/27/7824   GFRAA >60 01/23/2018   GFRNONAA >60 01/23/2018    Hepatic Lab Results  Component Value Date   AST 14 (L) 01/23/2018   ALT 10 (L) 01/23/2018   ALBUMIN 3.0 (L) 01/23/2018   ALKPHOS 52 01/23/2018   LIPASE 20 01/23/2018    Electrolytes Lab Results  Component Value Date   NA 140 01/23/2018   K 3.0 (L) 01/23/2018   CL 103 01/23/2018   CALCIUM 8.6 (L) 01/23/2018    Bone Lab Results  Component Value Date   VD25OH 47 05/06/2017    Inflammation (CRP: Acute Phase) (ESR: Chronic Phase) No results found for: CRP, ESRSEDRATE, LATICACIDVEN    Note: Above Lab results reviewed.  Imaging  DG Abd 1 View CLINICAL DATA:  Pre lithotripsy.  Left ureteral calculus.  EXAM: ABDOMEN - 1 VIEW  COMPARISON:  None.  FINDINGS: Stable 1.3 cm calculus in the left renal pelvis. There is no disproportionate dilatation of bowel. There is no  obvious free intraperitoneal gas. Vascular calcifications are noted.  IMPRESSION: Left nephrolithiasis.  Nonobstructive bowel gas pattern.  Electronically Signed   By: Marybelle Killings M.D.   On: 12/02/2018 10:11  Assessment  The primary encounter diagnosis was Lumbar degenerative disc disease. Diagnoses of Chronic pain syndrome, Lumbar spondylosis, Long term prescription opiate use, Primary osteoarthritis of both knees, and Osteoarthritis of both shoulders, unspecified osteoarthritis type were also pertinent to this visit.  Plan of Care   Pharmacotherapy (Medications Ordered): Meds ordered this encounter  Medications  . oxyCODONE-acetaminophen (PERCOCET) 10-325 MG tablet    Sig: Take 1 tablet by mouth every 6 (six) hours as needed for pain. Must last 30 days.    Dispense:  90 tablet    Refill:  0    Chronic Pain. (STOP Act - Not applicable). Fill one day early if closed on scheduled refill date.  Marland Kitchen oxyCODONE-acetaminophen (PERCOCET) 10-325 MG tablet    Sig: Take 1 tablet by mouth every 6 (six) hours as needed for pain. Must last 30 days.    Dispense:  90 tablet    Refill:  0    Chronic Pain. (STOP Act - Not applicable). Fill one day early if closed on scheduled refill date.  Marland Kitchen oxyCODONE-acetaminophen (PERCOCET) 10-325 MG tablet    Sig: Take 1 tablet by mouth every 6 (six) hours as needed for pain. Must last 30 days.    Dispense:  90 tablet    Refill:  0    Chronic Pain. (STOP Act - Not applicable). Fill one day early if closed on scheduled refill date.   Orders Placed This Encounter  Procedures  . ToxASSURE Select 13 (MW), Urine    Volume: 30 ml(s). Minimum 3 ml of urine is needed. Document temperature of fresh sample. Indications: Long term (current) use of opiate analgesic (M35.361)    Follow-up plan:   Return in about 3 months (around 01/10/2020) for Medication Management.    Recent Visits No visits were found meeting these conditions.  Showing recent visits within  past 90 days and meeting all other requirements   Today's Visits Date Type Provider Dept  10/13/19 Office Visit [name], MD [facility]  Showing today's visits and meeting all other requirements   Future Appointments No visits were found meeting these conditions.  Showing future appointments within next 90 days and meeting all other requirements   I discussed  the assessment and treatment plan with the patient. The patient was provided an opportunity to ask questions and all were answered. The patient agreed with the plan and demonstrated an understanding of the instructions.  Patient advised to call back or seek an in-person evaluation if the symptoms or condition worsens.  Duration of encounter: 25 minutes.  Note by: [name], MD Date: 10/13/2019; Time: 8:47 AM

## 2019-11-06 LAB — TOXASSURE SELECT 13 (MW), URINE

## 2019-12-24 DIAGNOSIS — F330 Major depressive disorder, recurrent, mild: Secondary | ICD-10-CM | POA: Diagnosis not present

## 2019-12-24 DIAGNOSIS — I509 Heart failure, unspecified: Secondary | ICD-10-CM | POA: Diagnosis not present

## 2019-12-24 DIAGNOSIS — I110 Hypertensive heart disease with heart failure: Secondary | ICD-10-CM | POA: Diagnosis not present

## 2019-12-24 DIAGNOSIS — E261 Secondary hyperaldosteronism: Secondary | ICD-10-CM | POA: Diagnosis not present

## 2019-12-24 DIAGNOSIS — Z6829 Body mass index (BMI) 29.0-29.9, adult: Secondary | ICD-10-CM | POA: Diagnosis not present

## 2019-12-24 DIAGNOSIS — I739 Peripheral vascular disease, unspecified: Secondary | ICD-10-CM | POA: Diagnosis not present

## 2020-01-02 ENCOUNTER — Encounter: Payer: Self-pay | Admitting: Cardiology

## 2020-01-02 ENCOUNTER — Encounter: Payer: Self-pay | Admitting: *Deleted

## 2020-01-02 ENCOUNTER — Other Ambulatory Visit: Payer: Self-pay

## 2020-01-02 ENCOUNTER — Telehealth (INDEPENDENT_AMBULATORY_CARE_PROVIDER_SITE_OTHER): Payer: Medicare HMO | Admitting: Cardiology

## 2020-01-02 ENCOUNTER — Telehealth: Payer: Self-pay | Admitting: *Deleted

## 2020-01-02 VITALS — BP 134/82 | HR 54 | Ht 68.0 in | Wt 207.0 lb

## 2020-01-02 DIAGNOSIS — I25119 Atherosclerotic heart disease of native coronary artery with unspecified angina pectoris: Secondary | ICD-10-CM | POA: Diagnosis not present

## 2020-01-02 DIAGNOSIS — E782 Mixed hyperlipidemia: Secondary | ICD-10-CM

## 2020-01-02 MED ORDER — NITROGLYCERIN 0.4 MG SL SUBL: 0.4000 mg | SUBLINGUAL_TABLET | SUBLINGUAL | 3 refills | Status: DC | PRN

## 2020-01-02 NOTE — Progress Notes (Signed)
Virtual Visit via Telephone Note   This visit type was conducted due to national recommendations for restrictions regarding the COVID-19 Pandemic (e.g. social distancing) in an effort to limit this patient's exposure and mitigate transmission in our community.  Due to his co-morbid illnesses, this patient is at least at moderate risk for complications without adequate follow up.  This format is felt to be most appropriate for this patient at this time.  The patient did not have access to video technology/had technical difficulties with video requiring transitioning to audio format only (telephone).  All issues noted in this document were discussed and addressed.  No physical exam could be performed with this format.  Please refer to the patient's chart for his  consent to telehealth for [facility].   The patient was identified using 2 identifiers.  Date:  01/02/2020   ID:  Jack [name], DOB 09-Oct-1945, MRN [id number]  Patient Location: Home Provider Location: Office  PCP:  Neale Burly, MD  Cardiologist:  Rozann Lesches, MD Electrophysiologist:  None   Evaluation Performed:  Follow-Up Visit  Chief Complaint:  Cardiac follow-up  History of Present Illness:    Jack [name] is a 74 y.o. male last assessed via telehealth encounter in July 2020.Marland Kitchen  He does not report any active angina at this time.  Staying around the house still, functional with ADLs, no recent nitroglycerin use.  Coreg was discontinued at last encounter due to persistent bradycardia.  He has not noticed much of a difference.  Still no sudden dizziness or syncope.  I reviewed the remainder of his medications which are stable and outlined below.  We discussed getting a refill for a fresh bottle of nitroglycerin.  Also requesting recent lab work from physical with PCP.   Past Medical History:  Diagnosis Date  . Bradycardia   . Coronary atherosclerosis of native coronary artery    Multivessel status post CABG    . History of kidney stones   . Hyperlipidemia   . Hypertension   . Tobacco user    Past Surgical History:  Procedure Laterality Date  . APPENDECTOMY     age 63  . CHOLECYSTECTOMY  2003  . COLONOSCOPY N/A 04/21/2018   Procedure: COLONOSCOPY;  Surgeon: Rogene Houston, MD;  Location: [facility];  Service: Endoscopy;  Laterality: N/A;  12:00-rescheduled to 8/28 @ 10:30am per Lelon Frohlich  . CORONARY ARTERY BYPASS GRAFT  2008   LIMA to LAD, SVG to ramus, SVG to PDA and PLA  . EXTRACORPOREAL SHOCK WAVE LITHOTRIPSY Left 12/02/2018   Procedure: EXTRACORPOREAL SHOCK WAVE LITHOTRIPSY (ESWL);  Surgeon: Lucas Mallow, MD;  Location: [facility];  Service: Urology;  Laterality: Left;  . FOOT SURGERY    . KNEE ARTHROPLASTY    . POLYPECTOMY  04/21/2018   Procedure: POLYPECTOMY;  Surgeon: Rogene Houston, MD;  Location: [facility];  Service: Endoscopy;;  . ROTATOR CUFF REPAIR    . SHOULDER SURGERY       Current Meds  Medication Sig  . amLODipine (NORVASC) 5 MG tablet Take 5 mg by mouth every other day.   Marland Kitchen aspirin EC 81 MG tablet Take 1 tablet (81 mg total) by mouth daily.  . benazepril (LOTENSIN) 5 MG tablet Take 5 mg by mouth daily.   . Cholecalciferol (VITAMIN D3) 1000 units CAPS Take 1,000 Units by mouth daily.  . citalopram (CELEXA) 40 MG tablet Take 1 tablet (40 mg total) by mouth daily.  . furosemide (  LASIX) 20 MG tablet Take 20 mg by mouth daily.   . nitroGLYCERIN (NITROSTAT) 0.4 MG SL tablet Place 1 tablet (0.4 mg total) under the tongue every 5 (five) minutes x 3 doses as needed for chest pain (if no relief after 3rd dose, proceed to the ED for an evaluation or call 911).  Marland Kitchen oxyCODONE-acetaminophen (PERCOCET) 10-325 MG tablet Take 1 tablet by mouth every 6 (six) hours as needed for pain. Must last 30 days.  . polyethylene glycol (MIRALAX / GLYCOLAX) packet Take 17 g by mouth daily.   . rosuvastatin (CRESTOR) 20 MG tablet Take 20 mg by mouth daily.  . traZODone (DESYREL) 50 MG tablet Take  50 mg by mouth at bedtime.   . [DISCONTINUED] nitroGLYCERIN (NITROSTAT) 0.4 MG SL tablet Place 1 tablet (0.4 mg total) under the tongue every 5 (five) minutes as needed for chest pain.     Allergies:   Patient has no known allergies.   ROS:   No palpitations or syncope.  Prior CV studies:   The following studies were reviewed today:  Lexiscan Myoview 03/21/2015:  Findings consistent with prior myocardial infarction. Evidence of prior inferior infarct with no peri-infarct ischemia  This is an intermediate risk study. Intermediate risk due to mildly decreased LVEF, there is no current myocardium at jeopardy.  The left ventricular ejection fraction is mildly decreased (45-54%).  Labs/Other Tests and Data Reviewed:    EKG:  An ECG dated 11/14/2018 was personally reviewed today and demonstrated:  Sinus bradycardia at 45 bpm.  Recent Labs: Lab Results  Component Value Date/Time   CHOL 114 08/07/2017 10:12 AM   TRIG 129 08/07/2017 10:12 AM   HDL 36 (L) 08/07/2017 10:12 AM   CHOLHDL 3.2 08/07/2017 10:12 AM   LDLCALC 57 08/07/2017 10:12 AM    Wt Readings from Last 3 Encounters:  01/02/20 207 lb (93.9 kg)  07/14/19 187 lb (84.8 kg)  12/02/18 187 lb 6.4 oz (85 kg)     Objective:    Vital Signs:  BP 134/82   Pulse (!) 54   Ht 5\' 8"  (1.727 m)   Wt 207 lb (93.9 kg)   BMI 31.47 kg/m    Patient spoke in full sentences, not short of breath. No audible wheezing or coughing.  ASSESSMENT & PLAN:    1.  Multivessel CAD status post CABG in 2009.  Continue observation in the absence of angina symptoms on medical therapy.  Refill provided for fresh bottle of nitroglycerin.  He is otherwise on aspirin, Norvasc, Lotensin, and Crestor.  Beta-blocker discontinued due to bradycardia.  2.  Mixed hyperlipidemia, he remains on Crestor.  Requesting recent lab work from PCP.  Time:   Today, I have spent 5 minutes with the patient with telehealth technology discussing the above problems.      Medication Adjustments/Labs and Tests Ordered: Current medicines are reviewed at length with the patient today.  Concerns regarding medicines are outlined above.   Tests Ordered: No orders of the defined types were placed in this encounter.   Medication Changes: Meds ordered this encounter  Medications  . nitroGLYCERIN (NITROSTAT) 0.4 MG SL tablet    Sig: Place 1 tablet (0.4 mg total) under the tongue every 5 (five) minutes x 3 doses as needed for chest pain (if no relief after 3rd dose, proceed to the ED for an evaluation or call 911).    Dispense:  25 tablet    Refill:  3    Follow Up:  In Person  6 months in the [place] office.  Signed, Rozann Lesches, MD  01/02/2020 10:21 AM    [place]

## 2020-01-02 NOTE — Patient Instructions (Addendum)

## 2020-01-02 NOTE — Telephone Encounter (Signed)
  Patient Consent for Virtual Visit         Jack [name] has provided verbal consent on 01/02/2020 for a virtual visit (video or telephone).   CONSENT FOR VIRTUAL VISIT FOR:  Jack [name]  By participating in this virtual visit I agree to the following:  I hereby voluntarily request, consent and authorize [facility] and its employed or contracted physicians, physician assistants, nurse practitioners or other licensed health care professionals (the Practitioner), to provide me with telemedicine health care services (the "Services") as deemed necessary by the treating Practitioner. I acknowledge and consent to receive the Services by the Practitioner via telemedicine. I understand that the telemedicine visit will involve communicating with the Practitioner through live audiovisual communication technology and the disclosure of certain medical information by electronic transmission. I acknowledge that I have been given the opportunity to request an in-person assessment or other available alternative prior to the telemedicine visit and am voluntarily participating in the telemedicine visit.  I understand that I have the right to withhold or withdraw my consent to the use of telemedicine in the course of my care at any time, without affecting my right to future care or treatment, and that the Practitioner or I may terminate the telemedicine visit at any time. I understand that I have the right to inspect all information obtained and/or recorded in the course of the telemedicine visit and may receive copies of available information for a reasonable fee.  I understand that some of the potential risks of receiving the Services via telemedicine include:  Marland Kitchen Delay or interruption in medical evaluation due to technological equipment failure or disruption; . Information transmitted may not be sufficient (e.g. poor resolution of images) to allow for appropriate medical decision making by the Practitioner;  and/or  . In rare instances, security protocols could fail, causing a breach of personal health information.  Furthermore, I acknowledge that it is my responsibility to provide information about my medical history, conditions and care that is complete and accurate to the best of my ability. I acknowledge that Practitioner's advice, recommendations, and/or decision may be based on factors not within their control, such as incomplete or inaccurate data provided by me or distortions of diagnostic images or specimens that may result from electronic transmissions. I understand that the practice of medicine is not an exact science and that Practitioner makes no warranties or guarantees regarding treatment outcomes. I acknowledge that a copy of this consent can be made available to me via my patient portal ([facility]), or I can request a printed copy by calling the office of [facility].    I understand that my insurance will be billed for this visit.   I have read or had this consent read to me. . I understand the contents of this consent, which adequately explains the benefits and risks of the Services being provided via telemedicine.  . I have been provided ample opportunity to ask questions regarding this consent and the Services and have had my questions answered to my satisfaction. . I give my informed consent for the services to be provided through the use of telemedicine in my medical care

## 2020-01-05 ENCOUNTER — Telehealth: Payer: Self-pay | Admitting: *Deleted

## 2020-01-05 NOTE — Telephone Encounter (Signed)
-----   Message from Satira Sark, MD sent at 01/04/2020  4:52 PM EDT ----- Results reviewed.  Suggest adding Zetia 10 mg daily to his Crestor in light of LDL 111.  Would like to get this closer to 70.

## 2020-01-09 ENCOUNTER — Encounter: Payer: Self-pay | Admitting: Student in an Organized Health Care Education/Training Program

## 2020-01-10 ENCOUNTER — Encounter: Payer: Self-pay | Admitting: Student in an Organized Health Care Education/Training Program

## 2020-01-10 ENCOUNTER — Ambulatory Visit
Payer: Medicare HMO | Attending: Student in an Organized Health Care Education/Training Program | Admitting: Student in an Organized Health Care Education/Training Program

## 2020-01-10 ENCOUNTER — Other Ambulatory Visit: Payer: Self-pay

## 2020-01-10 DIAGNOSIS — Z79891 Long term (current) use of opiate analgesic: Secondary | ICD-10-CM

## 2020-01-10 DIAGNOSIS — Z79899 Other long term (current) drug therapy: Secondary | ICD-10-CM

## 2020-01-10 DIAGNOSIS — M47816 Spondylosis without myelopathy or radiculopathy, lumbar region: Secondary | ICD-10-CM

## 2020-01-10 DIAGNOSIS — G894 Chronic pain syndrome: Secondary | ICD-10-CM | POA: Diagnosis not present

## 2020-01-10 DIAGNOSIS — M5136 Other intervertebral disc degeneration, lumbar region: Secondary | ICD-10-CM

## 2020-01-10 DIAGNOSIS — M19011 Primary osteoarthritis, right shoulder: Secondary | ICD-10-CM

## 2020-01-10 DIAGNOSIS — M19012 Primary osteoarthritis, left shoulder: Secondary | ICD-10-CM | POA: Diagnosis not present

## 2020-01-10 MED ORDER — OXYCODONE-ACETAMINOPHEN 10-325 MG PO TABS: 1.0000 | ORAL_TABLET | Freq: Four times a day (QID) | ORAL | 0 refills | Status: AC | PRN

## 2020-01-10 MED ORDER — OXYCODONE-ACETAMINOPHEN 10-325 MG PO TABS: 1.0000 | ORAL_TABLET | Freq: Four times a day (QID) | ORAL | 0 refills | Status: DC | PRN

## 2020-01-10 NOTE — Progress Notes (Signed)
Patient: Jack [name]  Service Category: E/M  Provider: [name], MD  DOB: 08/24/1946  DOS: 01/10/2020  Location: Office  MRN: [id number]  Setting: Ambulatory outpatient  Referring Provider: Neale Burly, MD  Type: Established Patient  Specialty: Interventional Pain Management  PCP: Neale Burly, MD  Location: Home  Delivery: TeleHealth     Virtual Encounter - Pain Management PROVIDER NOTE: Information contained herein reflects review and annotations entered in association with encounter. Interpretation of such information and data should be left to medically-trained personnel. Information provided to patient can be located elsewhere in the medical record under "Patient Instructions". Document created using STT-dictation technology, any transcriptional errors that may result from process are unintentional.    Contact & Pharmacy Preferred: [phone] Home: [phone] (home) Mobile: [phone] (mobile) E-mail: No e-mail address on record  [place] [place] [address] [place] [address] Phone: [phone] Fax: [phone]   Pre-screening  Mr. Osuna offered "in-person" vs "virtual" encounter. He indicated preferring virtual for this encounter.   Reason COVID-19*  Social distancing based on CDC and AMA recommendations.   I contacted [name] on 01/10/2020 via video conference.      I clearly identified myself as [name], MD. I verified that I was speaking with the correct person using two identifiers (Name: VIRLAN KEMPKER, and date of birth: [birth year]).  Consent I sought verbal advanced consent from [name] for virtual visit interactions. I informed Mr. Foxworthy of possible security and privacy concerns, risks, and limitations associated with providing "not-in-person" medical evaluation and management services. I also informed Mr. Valvo of the availability of "in-person" appointments. Finally, I informed him that there would  be a charge for the virtual visit and that he could be  personally, fully or partially, financially responsible for it. Mr. Magowan expressed understanding and agreed to proceed.   Historic Elements   Mr. KJELL BRANNEN is a 74 y.o. year old, male patient evaluated today after his last contact with our practice on 10/13/2019. Mr. Bruhl  has a past medical history of Bradycardia, Coronary atherosclerosis of native coronary artery, History of kidney stones, Hyperlipidemia, Hypertension, and Tobacco user. He also  has a past surgical history that includes Foot surgery; Appendectomy; Knee Arthroplasty; Coronary artery bypass graft (2008); Rotator cuff repair; Shoulder surgery; Cholecystectomy (2003); Colonoscopy (N/A, 04/21/2018); polypectomy (04/21/2018); and Extracorporeal shock wave lithotripsy (Left, 12/02/2018). Mr. Innes has a current medication list which includes the following prescription(s): amlodipine, aspirin ec, benazepril, vitamin d3, citalopram, furosemide, guaifenesin-dextromethorphan, nitroglycerin, [START ON 01/17/2020] oxycodone-acetaminophen, [START ON 02/16/2020] oxycodone-acetaminophen, [START ON 03/17/2020] oxycodone-acetaminophen, polyethylene glycol, rosuvastatin, and trazodone. He  reports that he has been smoking cigarettes. He started smoking about 60 years ago. He has a 57.00 pack-year smoking history. He has never used smokeless tobacco. He reports current alcohol use. He reports that he does not use drugs. Mr. Somera has No Known Allergies.   HPI  Today, he is being contacted for medication management.   No change in medical history since last visit.  Patient's pain is at baseline.  Patient continues multimodal pain regimen as prescribed.  States that it provides pain relief and improvement in functional status.  Pharmacotherapy Assessment  Analgesic: 12/19/2019  1   10/13/2019  Oxycodone-Acetaminophen 10-325  90.00  30 Bi Lat   [id number]   Mit (3484)   0  45.00 MME  Medicare   [place]    Monitoring: [place] PMP: PDMP reviewed  during this encounter.       Pharmacotherapy: No side-effects or adverse reactions reported. Compliance: No problems identified.  Effectiveness: Clinically acceptable. Plan: Refer to "POC".  UDS:  Summary  Date Value Ref Range Status  11/03/2019 Note  Final    Comment:    ==================================================================== [employer] Select 13 (MW) ==================================================================== Test                             Result       Flag       Units Drug Present and Declared for Prescription Verification   Oxycodone                      3843         EXPECTED   ng/mg creat   Oxymorphone                    626          EXPECTED   ng/mg creat   Noroxycodone                   >6098        EXPECTED   ng/mg creat   Noroxymorphone                 221          EXPECTED   ng/mg creat    Sources of oxycodone are scheduled prescription medications.    Oxymorphone, noroxycodone, and noroxymorphone are expected    metabolites of oxycodone. Oxymorphone is also available as a    scheduled prescription medication. ==================================================================== Test                      Result    Flag   Units      Ref Range   Creatinine              164              mg/dL      >=20 ==================================================================== Declared Medications:  The flagging and interpretation on this report are based on the  following declared medications.  Unexpected results may arise from  inaccuracies in the declared medications.  **Note: The testing scope of this panel includes these medications:  Oxycodone  **Note: The testing scope of this panel does not include the  following reported medications:  Acetaminophen  Amlodipine  Aspirin  Benazepril  Citalopram (Celexa)  Furosemide  Nitroglycerin  Polyethylene Glycol  Rosuvastatin  Trazodone  Vitamin  D3 ==================================================================== For clinical consultation, please call [phone]. ====================================================================    Laboratory Chemistry Profile   Renal Lab Results  Component Value Date   BUN 13 01/23/2018   CREATININE 1.17 45/80/9983   BCR NOT APPLICABLE 38/25/0539   GFRAA >60 01/23/2018   GFRNONAA >60 01/23/2018     Hepatic Lab Results  Component Value Date   AST 14 (L) 01/23/2018   ALT 10 (L) 01/23/2018   ALBUMIN 3.0 (L) 01/23/2018   ALKPHOS 52 01/23/2018   LIPASE 20 01/23/2018     Electrolytes Lab Results  Component Value Date   NA 140 01/23/2018   K 3.0 (L) 01/23/2018   CL 103 01/23/2018   CALCIUM 8.6 (L) 01/23/2018     Bone Lab Results  Component Value Date   VD25OH 47 05/06/2017     Inflammation (CRP: Acute Phase) (ESR: Chronic Phase) No results found  for: CRP, ESRSEDRATE, LATICACIDVEN     Note: Above Lab results reviewed.  Imaging  DG Abd 1 View CLINICAL DATA:  Pre lithotripsy.  Left ureteral calculus.  EXAM: ABDOMEN - 1 VIEW  COMPARISON:  None.  FINDINGS: Stable 1.3 cm calculus in the left renal pelvis. There is no disproportionate dilatation of bowel. There is no obvious free intraperitoneal gas. Vascular calcifications are noted.  IMPRESSION: Left nephrolithiasis.  Nonobstructive bowel gas pattern.  Electronically Signed   By: Marybelle Killings M.D.   On: 12/02/2018 10:11  Assessment  The primary encounter diagnosis was Lumbar degenerative disc disease. Diagnoses of Chronic pain syndrome, Osteoarthritis of both shoulders, unspecified osteoarthritis type, Lumbar spondylosis, Chronic prescription benzodiazepine use, and Long term prescription opiate use were also pertinent to this visit.  Plan of Care  Mr. DAL BLEW has a current medication list which includes the following long-term medication(s): citalopram and nitroglycerin.  Pharmacotherapy  (Medications Ordered): Meds ordered this encounter  Medications  . oxyCODONE-acetaminophen (PERCOCET) 10-325 MG tablet    Sig: Take 1 tablet by mouth every 6 (six) hours as needed for pain. Must last 30 days.    Dispense:  90 tablet    Refill:  0    Chronic Pain. (STOP Act - Not applicable). Fill one day early if closed on scheduled refill date.  Marland Kitchen oxyCODONE-acetaminophen (PERCOCET) 10-325 MG tablet    Sig: Take 1 tablet by mouth every 6 (six) hours as needed for pain. Must last 30 days.    Dispense:  90 tablet    Refill:  0    Chronic Pain. (STOP Act - Not applicable). Fill one day early if closed on scheduled refill date.  Marland Kitchen oxyCODONE-acetaminophen (PERCOCET) 10-325 MG tablet    Sig: Take 1 tablet by mouth every 6 (six) hours as needed for pain. Must last 30 days.    Dispense:  90 tablet    Refill:  0    Chronic Pain. (STOP Act - Not applicable). Fill one day early if closed on scheduled refill date.    Follow-up plan:   Return in about 3 months (around 04/11/2020) for Medication Management, in person.   Recent Visits Date Type Provider Dept  10/13/19 Office Visit [name], MD [facility]  Showing recent visits within past 90 days and meeting all other requirements   Today's Visits Date Type Provider Dept  01/10/20 Telemedicine [name], MD [facility]  Showing today's visits and meeting all other requirements   Future Appointments No visits were found meeting these conditions.  Showing future appointments within next 90 days and meeting all other requirements   I discussed the assessment and treatment plan with the patient. The patient was provided an opportunity to ask questions and all were answered. The patient agreed with the plan and demonstrated an understanding of the instructions.  Patient advised to call back or seek an in-person evaluation if the symptoms or condition worsens.  Duration of encounter: 61mnutes.  Note by: BGillis Santa MD Date: 01/10/2020; Time: 9:12 AM

## 2020-01-17 ENCOUNTER — Encounter: Payer: Self-pay | Admitting: *Deleted

## 2020-01-17 NOTE — Telephone Encounter (Signed)
Called/no answer/vm not set up Letter mailed

## 2020-02-02 DIAGNOSIS — E261 Secondary hyperaldosteronism: Secondary | ICD-10-CM | POA: Diagnosis not present

## 2020-02-02 DIAGNOSIS — F330 Major depressive disorder, recurrent, mild: Secondary | ICD-10-CM | POA: Diagnosis not present

## 2020-02-02 DIAGNOSIS — E785 Hyperlipidemia, unspecified: Secondary | ICD-10-CM | POA: Diagnosis not present

## 2020-02-02 DIAGNOSIS — I110 Hypertensive heart disease with heart failure: Secondary | ICD-10-CM | POA: Diagnosis not present

## 2020-02-02 DIAGNOSIS — Z6829 Body mass index (BMI) 29.0-29.9, adult: Secondary | ICD-10-CM | POA: Diagnosis not present

## 2020-02-02 DIAGNOSIS — Z951 Presence of aortocoronary bypass graft: Secondary | ICD-10-CM | POA: Diagnosis not present

## 2020-02-02 DIAGNOSIS — I25118 Atherosclerotic heart disease of native coronary artery with other forms of angina pectoris: Secondary | ICD-10-CM | POA: Diagnosis not present

## 2020-02-02 DIAGNOSIS — I739 Peripheral vascular disease, unspecified: Secondary | ICD-10-CM | POA: Diagnosis not present

## 2020-02-02 DIAGNOSIS — I5032 Chronic diastolic (congestive) heart failure: Secondary | ICD-10-CM | POA: Diagnosis not present

## 2020-04-10 ENCOUNTER — Ambulatory Visit
Payer: Medicare HMO | Attending: Student in an Organized Health Care Education/Training Program | Admitting: Student in an Organized Health Care Education/Training Program

## 2020-04-10 ENCOUNTER — Other Ambulatory Visit: Payer: Self-pay

## 2020-04-10 ENCOUNTER — Encounter: Payer: Self-pay | Admitting: Student in an Organized Health Care Education/Training Program

## 2020-04-10 VITALS — BP 164/86 | HR 58 | Temp 97.8°F | Resp 18 | Ht 70.0 in | Wt 200.0 lb

## 2020-04-10 DIAGNOSIS — M47816 Spondylosis without myelopathy or radiculopathy, lumbar region: Secondary | ICD-10-CM | POA: Diagnosis not present

## 2020-04-10 DIAGNOSIS — M19011 Primary osteoarthritis, right shoulder: Secondary | ICD-10-CM | POA: Diagnosis not present

## 2020-04-10 DIAGNOSIS — G894 Chronic pain syndrome: Secondary | ICD-10-CM | POA: Insufficient documentation

## 2020-04-10 DIAGNOSIS — Z79899 Other long term (current) drug therapy: Secondary | ICD-10-CM | POA: Diagnosis not present

## 2020-04-10 DIAGNOSIS — M19012 Primary osteoarthritis, left shoulder: Secondary | ICD-10-CM | POA: Insufficient documentation

## 2020-04-10 DIAGNOSIS — M5136 Other intervertebral disc degeneration, lumbar region: Secondary | ICD-10-CM | POA: Insufficient documentation

## 2020-04-10 DIAGNOSIS — M170 Bilateral primary osteoarthritis of knee: Secondary | ICD-10-CM | POA: Diagnosis not present

## 2020-04-10 DIAGNOSIS — Z79891 Long term (current) use of opiate analgesic: Secondary | ICD-10-CM | POA: Insufficient documentation

## 2020-04-10 MED ORDER — OXYCODONE-ACETAMINOPHEN 10-325 MG PO TABS: 1.0000 | ORAL_TABLET | Freq: Four times a day (QID) | ORAL | 0 refills | Status: AC | PRN

## 2020-04-10 MED ORDER — OXYCODONE-ACETAMINOPHEN 10-325 MG PO TABS: 1.0000 | ORAL_TABLET | Freq: Four times a day (QID) | ORAL | 0 refills | Status: DC | PRN

## 2020-04-10 NOTE — Progress Notes (Signed)
Nursing Pain Medication Assessment:  Safety precautions to be maintained throughout the outpatient stay will include: orient to surroundings, keep bed in low position, maintain call bell within reach at all times, provide assistance with transfer out of bed and ambulation.  Medication Inspection Compliance: Pill count conducted under aseptic conditions, in front of the patient. Neither the pills nor the bottle was removed from the patient's sight at any time. Once count was completed pills were immediately returned to the patient in their original bottle.  Medication: Oxycodone/APAP Pill/Patch Count: 18 of 90 pills remain Pill/Patch Appearance: Markings consistent with prescribed medication Bottle Appearance: Standard pharmacy container. Clearly labeled. Filled Date: 07 /24/ 2021 Last Medication intake:  Today

## 2020-04-10 NOTE — Progress Notes (Signed)
PROVIDER NOTE: Information contained herein reflects review and annotations entered in association with encounter. Interpretation of such information and data should be left to medically-trained personnel. Information provided to patient can be located elsewhere in the medical record under "Patient Instructions". Document created using STT-dictation technology, any transcriptional errors that may result from process are unintentional.    Patient: Jack [name]  Service Category: E/M  Provider: [name], MD  DOB: 25-Apr-1946  DOS: 04/10/2020  Specialty: Interventional Pain Management  MRN: [id number]  Setting: Ambulatory outpatient  PCP: Jack Burly, MD  Type: Established Patient    Referring Provider: Neale Burly, MD  Location: Office  Delivery: Face-to-face     HPI  Reason for encounter: [name], a 74 y.o. year old male, is here today for evaluation and management of his Lumbar degenerative disc disease [M51.36]. [name] primary complain today is Abdominal Pain Last encounter: Practice (10/13/2019). My last encounter with him was on 10/13/2019. Pertinent problems: [name] has Hypertension; Osteoarthritis of both shoulders; Lumbar spondylosis; Lumbar degenerative disc disease; Chronic pain of both shoulders; Long term prescription opiate use; Chronic pain syndrome; and Primary osteoarthritis of both knees on their pertinent problem list. Pain Assessment: Severity of Chronic pain is reported as a 3 /10. Location: Abdomen Mid, Lower/denies. Onset: More than a month ago. Quality: Aching. Timing: Intermittent. Modifying factor(s): pain meds. Vitals:  height is 5' 10"  (1.778 m) and weight is 200 lb (90.7 kg). His temperature is 97.8 F (36.6 C). His blood pressure is 164/86 (abnormal) and his pulse is 58 (abnormal). His respiration is 18 and oxygen saturation is 100%.   No change in medical history since last visit.  Patient's pain is at baseline.  Patient continues multimodal  pain regimen as prescribed.  States that it provides pain relief and improvement in functional status.  Pharmacotherapy Assessment   03/17/2020  1   01/10/2020  Oxycodone-Acetaminophen 10-325  90.00  30 Bi Lat   [id number]   Mit (3484)   0/0  45.00 MME  Medicare   [place]      Monitoring: [place] PMP: PDMP reviewed during this encounter.       Pharmacotherapy: No side-effects or adverse reactions reported. Compliance: No problems identified. Effectiveness: Clinically acceptable.  Jack Shorter, RN  04/10/2020 10:49 AM  Signed Nursing Pain Medication Assessment:  Safety precautions to be maintained throughout the outpatient stay will include: orient to surroundings, keep bed in low position, maintain call bell within reach at all times, provide assistance with transfer out of bed and ambulation.  Medication Inspection Compliance: Pill count conducted under aseptic conditions, in front of the patient. Neither the pills nor the bottle was removed from the patient's sight at any time. Once count was completed pills were immediately returned to the patient in their original bottle.  Medication: Oxycodone/APAP Pill/Patch Count: 18 of 90 pills remain Pill/Patch Appearance: Markings consistent with prescribed medication Bottle Appearance: Standard pharmacy container. Clearly labeled. Filled Date: 07 /24/ 2021 Last Medication intake:  Today    UDS:  Summary  Date Value Ref Range Status  11/03/2019 Note  Final    Comment:    ==================================================================== [facility] (MW) ==================================================================== Test                             Result       Flag       Units Drug Present and Declared for Prescription  Verification   Oxycodone                      3843         EXPECTED   ng/mg creat   Oxymorphone                    626          EXPECTED   ng/mg creat   Noroxycodone                   >6098        EXPECTED   ng/mg  creat   Noroxymorphone                 221          EXPECTED   ng/mg creat    Sources of oxycodone are scheduled prescription medications.    Oxymorphone, noroxycodone, and noroxymorphone are expected    metabolites of oxycodone. Oxymorphone is also available as a    scheduled prescription medication. ==================================================================== Test                      Result    Flag   Units      Ref Range   Creatinine              164              mg/dL      >=20 ==================================================================== Declared Medications:  The flagging and interpretation on this report are based on the  following declared medications.  Unexpected results may arise from  inaccuracies in the declared medications.  **Note: The testing scope of this panel includes these medications:  Oxycodone  **Note: The testing scope of this panel does not include the  following reported medications:  Acetaminophen  Amlodipine  Aspirin  Benazepril  Citalopram (Celexa)  Furosemide  Nitroglycerin  Polyethylene Glycol  Rosuvastatin  Trazodone  Vitamin D3 ==================================================================== For clinical consultation, please call [phone]. ====================================================================      ROS  Constitutional: Denies any fever or chills Gastrointestinal: No reported hemesis, hematochezia, vomiting, or acute GI distress Musculoskeletal: Denies any acute onset joint swelling, redness, loss of ROM, or weakness Neurological: No reported episodes of acute onset apraxia, aphasia, dysarthria, agnosia, amnesia, paralysis, loss of coordination, or loss of consciousness  Medication Review  Vitamin D3, amLODipine, aspirin EC, benazepril, citalopram, furosemide, guaiFENesin-dextromethorphan, nitroGLYCERIN, oxyCODONE-acetaminophen, polyethylene glycol, rosuvastatin, and traZODone  History Review  Allergy:  [name] has No Known Allergies. Drug: [name]  reports no history of drug use. Alcohol:  reports current alcohol use. Tobacco:  reports that he has been smoking cigarettes. He started smoking about 60 years ago. He has a 57.00 pack-year smoking history. He has never used smokeless tobacco. Social: [name]  reports that he has been smoking cigarettes. He started smoking about 60 years ago. He has a 57.00 pack-year smoking history. He has never used smokeless tobacco. He reports current alcohol use. He reports that he does not use drugs. Medical:  has a past medical history of Bradycardia, Coronary atherosclerosis of native coronary artery, History of kidney stones, Hyperlipidemia, Hypertension, and Tobacco user. Surgical: [name]  has a past surgical history that includes Foot surgery; Appendectomy; Knee Arthroplasty; Coronary artery bypass graft (2008); Rotator cuff repair; Shoulder surgery; Cholecystectomy (2003); Colonoscopy (N/A, 04/21/2018); polypectomy (04/21/2018); and Extracorporeal shock wave lithotripsy (Left, 12/02/2018). Family: family history includes Cerebral aneurysm in his  father; Heart attack in his mother.  Laboratory Chemistry Profile   Renal Lab Results  Component Value Date   BUN 13 01/23/2018   CREATININE 1.17 45/80/9983   BCR NOT APPLICABLE 38/25/0539   GFRAA >60 01/23/2018   GFRNONAA >60 01/23/2018     Hepatic Lab Results  Component Value Date   AST 14 (L) 01/23/2018   ALT 10 (L) 01/23/2018   ALBUMIN 3.0 (L) 01/23/2018   ALKPHOS 52 01/23/2018   LIPASE 20 01/23/2018     Electrolytes Lab Results  Component Value Date   NA 140 01/23/2018   K 3.0 (L) 01/23/2018   CL 103 01/23/2018   CALCIUM 8.6 (L) 01/23/2018     Bone Lab Results  Component Value Date   VD25OH 47 05/06/2017     Inflammation (CRP: Acute Phase) (ESR: Chronic Phase) No results found for: CRP, ESRSEDRATE, LATICACIDVEN     Note: Above Lab results reviewed.  Recent Imaging  Review  DG Abd 1 View CLINICAL DATA:  Pre lithotripsy.  Left ureteral calculus.  EXAM: ABDOMEN - 1 VIEW  COMPARISON:  None.  FINDINGS: Stable 1.3 cm calculus in the left renal pelvis. There is no disproportionate dilatation of bowel. There is no obvious free intraperitoneal gas. Vascular calcifications are noted.  IMPRESSION: Left nephrolithiasis.  Nonobstructive bowel gas pattern.  Electronically Signed   By: Marybelle Killings M.D.   On: 12/02/2018 10:11 Note: Reviewed        Physical Exam  General appearance: alert, cooperative and in no distress Mental status: Alert, oriented x 3 (person, place, & time)       Respiratory: No evidence of acute respiratory distress Eyes: PERLA Vitals: BP (!) 164/86   Pulse (!) 58   Temp 97.8 F (36.6 C)   Resp 18   Ht 5' 10"  (1.778 m)   Wt 200 lb (90.7 kg)   SpO2 100%   BMI 28.70 kg/m  BMI: Estimated body mass index is 28.7 kg/m as calculated from the following:   Height as of this encounter: 5' 10"  (1.778 m).   Weight as of this encounter: 200 lb (90.7 kg). Ideal: Ideal body weight: 73 kg (160 lb 15 oz) Adjusted ideal body weight: 80.1 kg (176 lb 9 oz)   Cervical Spine Area Exam  Skin & Axial Inspection: No masses, redness, edema, swelling, or associated skin lesions Alignment: Symmetrical Functional ROM: Unrestricted ROM      Stability: No instability detected Muscle Tone/Strength: Functionally intact. No obvious neuro-muscular anomalies detected. Sensory (Neurological): Unimpaired Palpation: No palpable anomalies                     Upper Extremity (UE) Exam    Side: Right upper extremity  Side: Left upper extremity   Skin & Extremity Inspection: Skin color, temperature, and hair growth are WNL. No peripheral edema or cyanosis. No masses, redness, swelling, asymmetry, or associated skin lesions. No contractures.  Skin & Extremity Inspection: Skin color, temperature, and hair growth are WNL. No peripheral edema or  cyanosis. No masses, redness, swelling, asymmetry, or associated skin lesions. No contractures.   Functional ROM: Unrestricted ROM          Functional ROM: Unrestricted ROM           Muscle Tone/Strength: Functionally intact. No obvious neuro-muscular anomalies detected.  Muscle Tone/Strength: Functionally intact. No obvious neuro-muscular anomalies detected.   Sensory (Neurological): Arthropathic arthralgia          Sensory (Neurological): Unimpaired  Palpation: Complains of area being tender to palpation              Palpation: No palpable anomalies               Specialized Test(s): Deferred         Specialized Test(s): Deferred          Limited right shoulder abduction Thoracic Spine Area Exam  Skin & Axial Inspection: No masses, redness, or swelling Alignment: Symmetrical Functional ROM: Unrestricted ROM Stability: No instability detected Muscle Tone/Strength: Functionally intact. No obvious neuro-muscular anomalies detected. Sensory (Neurological): Unimpaired Muscle strength & Tone: No palpable anomalies  Lumbar Spine Area Exam  Skin & Axial Inspection: No masses, redness, or swelling Alignment: Symmetrical Functional ROM: Unrestricted ROM      Stability: No instability detected Muscle Tone/Strength: Functionally intact. No obvious neuro-muscular anomalies detected. Sensory (Neurological): Unimpaired Palpation: No palpable anomalies       Provocative Tests: Lumbar Hyperextension and rotation test: Positive bilaterally for facet joint pain. Lumbar Lateral bending test: Positive ipsilateral radicular pain, bilaterally. Positive for bilateral foraminal stenosis. Patrick's Maneuver: Positive for bilateral S-I arthralgia              Gait & Posture Assessment  Ambulation: Limited Gait: Antalgic Posture: WNL   Lower Extremity Exam    Side: Right lower extremity  Side: Left lower extremity  Skin & Extremity Inspection: Evidence of prior arthroplastic surgery   Skin & Extremity Inspection: Skin color, temperature, and hair growth are WNL. No peripheral edema or cyanosis. No masses, redness, swelling, asymmetry, or associated skin lesions. No contractures.  Functional ROM: Decreased ROM          Functional ROM: Decreased ROM          Muscle Tone/Strength: Functionally intact. No obvious neuro-muscular anomalies detected.  Muscle Tone/Strength: Functionally intact. No obvious neuro-muscular anomalies detected.  Sensory (Neurological): Arthropathic arthralgia  Sensory (Neurological): Arthropathic arthralgia  Palpation: Tender  Palpation: Complains of area being tender to palpation     Assessment   Status Diagnosis  Controlled Controlled Controlled 1. Lumbar degenerative disc disease   2. Chronic pain syndrome   3. Osteoarthritis of both shoulders, unspecified osteoarthritis type   4. Lumbar spondylosis   5. Chronic prescription benzodiazepine use   6. Long term prescription opiate use   7. Primary osteoarthritis of both knees      Updated Problems: Problem  Primary Osteoarthritis of Both Knees  Osteoarthritis of Both Shoulders  Lumbar Spondylosis  Lumbar Degenerative Disc Disease  Chronic Pain of Both Shoulders  Long Term Prescription Opiate Use  Chronic Pain Syndrome  Hypertension    Plan of Care  [name] has a current medication list which includes the following long-term medication(s): citalopram and nitroglycerin.  Pharmacotherapy (Medications Ordered): Meds ordered this encounter  Medications  . oxyCODONE-acetaminophen (PERCOCET) 10-325 MG tablet    Sig: Take 1 tablet by mouth every 6 (six) hours as needed for pain. Must last 30 days.    Dispense:  90 tablet    Refill:  0    Chronic Pain. (STOP Act - Not applicable). Fill one day early if closed on scheduled refill date.  Marland Kitchen oxyCODONE-acetaminophen (PERCOCET) 10-325 MG tablet    Sig: Take 1 tablet by mouth every 6 (six) hours as needed for pain. Must last 30  days.    Dispense:  90 tablet    Refill:  0    Chronic Pain. (STOP Act - Not applicable). Fill  one day early if closed on scheduled refill date.  Marland Kitchen oxyCODONE-acetaminophen (PERCOCET) 10-325 MG tablet    Sig: Take 1 tablet by mouth every 6 (six) hours as needed for pain. Must last 30 days.    Dispense:  90 tablet    Refill:  0    Chronic Pain. (STOP Act - Not applicable). Fill one day early if closed on scheduled refill date.    Follow-up plan:   Return in about 3 months (around 07/11/2020) for Medication Management, in person.   Recent Visits No visits were found meeting these conditions. Showing recent visits within past 90 days and meeting all other requirements Today's Visits Date Type Provider Dept  04/10/20 Office Visit Jack Santa, MD [facility]  Showing today's visits and meeting all other requirements Future Appointments Date Type Provider Dept  07/05/20 Appointment Jack Santa, MD [facility]  Showing future appointments within next 90 days and meeting all other requirements  I discussed the assessment and treatment plan with the patient. The patient was provided an opportunity to ask questions and all were answered. The patient agreed with the plan and demonstrated an understanding of the instructions.  Patient advised to call back or seek an in-person evaluation if the symptoms or condition worsens.  Duration of encounter: 30 minutes.  Note by: Jack Santa, MD Date: 04/10/2020; Time: 11:36 AM

## 2020-05-31 DIAGNOSIS — E785 Hyperlipidemia, unspecified: Secondary | ICD-10-CM | POA: Diagnosis not present

## 2020-05-31 DIAGNOSIS — I5032 Chronic diastolic (congestive) heart failure: Secondary | ICD-10-CM | POA: Diagnosis not present

## 2020-05-31 DIAGNOSIS — I25118 Atherosclerotic heart disease of native coronary artery with other forms of angina pectoris: Secondary | ICD-10-CM | POA: Diagnosis not present

## 2020-05-31 DIAGNOSIS — J449 Chronic obstructive pulmonary disease, unspecified: Secondary | ICD-10-CM | POA: Diagnosis not present

## 2020-05-31 DIAGNOSIS — I739 Peripheral vascular disease, unspecified: Secondary | ICD-10-CM | POA: Diagnosis not present

## 2020-05-31 DIAGNOSIS — F330 Major depressive disorder, recurrent, mild: Secondary | ICD-10-CM | POA: Diagnosis not present

## 2020-05-31 DIAGNOSIS — I110 Hypertensive heart disease with heart failure: Secondary | ICD-10-CM | POA: Diagnosis not present

## 2020-05-31 DIAGNOSIS — F17200 Nicotine dependence, unspecified, uncomplicated: Secondary | ICD-10-CM | POA: Diagnosis not present

## 2020-05-31 DIAGNOSIS — Z6829 Body mass index (BMI) 29.0-29.9, adult: Secondary | ICD-10-CM | POA: Diagnosis not present

## 2020-06-13 ENCOUNTER — Telehealth: Payer: Self-pay | Admitting: Cardiology

## 2020-06-13 NOTE — Telephone Encounter (Signed)
  Patient Consent for Virtual Visit         Jack [name] has provided verbal consent on 06/13/2020 for a virtual visit (video or telephone).   CONSENT FOR VIRTUAL VISIT FOR:  Jack [name]  By participating in this virtual visit I agree to the following:  I hereby voluntarily request, consent and authorize [facility] and its employed or contracted physicians, physician assistants, nurse practitioners or other licensed health care professionals (the Practitioner), to provide me with telemedicine health care services (the "Services") as deemed necessary by the treating Practitioner. I acknowledge and consent to receive the Services by the Practitioner via telemedicine. I understand that the telemedicine visit will involve communicating with the Practitioner through live audiovisual communication technology and the disclosure of certain medical information by electronic transmission. I acknowledge that I have been given the opportunity to request an in-person assessment or other available alternative prior to the telemedicine visit and am voluntarily participating in the telemedicine visit.  I understand that I have the right to withhold or withdraw my consent to the use of telemedicine in the course of my care at any time, without affecting my right to future care or treatment, and that the Practitioner or I may terminate the telemedicine visit at any time. I understand that I have the right to inspect all information obtained and/or recorded in the course of the telemedicine visit and may receive copies of available information for a reasonable fee.  I understand that some of the potential risks of receiving the Services via telemedicine include:  Marland Kitchen Delay or interruption in medical evaluation due to technological equipment failure or disruption; . Information transmitted may not be sufficient (e.g. poor resolution of images) to allow for appropriate medical decision making by the Practitioner;  and/or  . In rare instances, security protocols could fail, causing a breach of personal health information.  Furthermore, I acknowledge that it is my responsibility to provide information about my medical history, conditions and care that is complete and accurate to the best of my ability. I acknowledge that Practitioner's advice, recommendations, and/or decision may be based on factors not within their control, such as incomplete or inaccurate data provided by me or distortions of diagnostic images or specimens that may result from electronic transmissions. I understand that the practice of medicine is not an exact science and that Practitioner makes no warranties or guarantees regarding treatment outcomes. I acknowledge that a copy of this consent can be made available to me via my patient portal ([facility]), or I can request a printed copy by calling the office of [facility].    I understand that my insurance will be billed for this visit.   I have read or had this consent read to me. . I understand the contents of this consent, which adequately explains the benefits and risks of the Services being provided via telemedicine.  . I have been provided ample opportunity to ask questions regarding this consent and the Services and have had my questions answered to my satisfaction. . I give my informed consent for the services to be provided through the use of telemedicine in my medical care

## 2020-06-28 NOTE — Progress Notes (Signed)
Virtual Visit via Telephone Note   This visit type was conducted due to national recommendations for restrictions regarding the COVID-19 Pandemic (e.g. social distancing) in an effort to limit this patient's exposure and mitigate transmission in our community.  Due to his co-morbid illnesses, this patient is at least at moderate risk for complications without adequate follow up.  This format is felt to be most appropriate for this patient at this time.  The patient did not have access to video technology/had technical difficulties with video requiring transitioning to audio format only (telephone).  All issues noted in this document were discussed and addressed.  No physical exam could be performed with this format.  Please refer to the patient's chart for his  consent to telehealth for [facility].    Date:  06/29/2020   ID:  [name], DOB 08-21-1946, MRN [id number] The patient was identified using 2 identifiers.  Patient Location: Home Provider Location: Home Office  PCP:  Neale Burly, MD  Cardiologist:  Rozann Lesches, MD  Electrophysiologist:  None   Evaluation Performed:  Follow-Up Visit  Chief Complaint:  Cardiac follow-up  History of Present Illness:    Jack [name] is a 74 y.o. male last assessed via telehealth encounter in May.  We spoke by phone today.  He states that he has been doing relatively well from a cardiac perspective, no angina or nitroglycerin use.  I reviewed his medications which are outlined below.  He reports compliance, no obvious intolerances.  I talked with him about getting a follow-up lipid profile to see if we need further up titration of his Crestor dose.  His last LDL per PCP was 111.  Recent vital signs reviewed.  He is bradycardic at baseline, but asymptomatic.   Past Medical History:  Diagnosis Date  . Bradycardia   . Coronary atherosclerosis of native coronary artery    Multivessel status post CABG  . History of kidney stones     . Hyperlipidemia   . Hypertension   . Tobacco user    Past Surgical History:  Procedure Laterality Date  . APPENDECTOMY     age 83  . CHOLECYSTECTOMY  2003  . COLONOSCOPY N/A 04/21/2018   Procedure: COLONOSCOPY;  Surgeon: Rogene Houston, MD;  Location: [facility];  Service: Endoscopy;  Laterality: N/A;  12:00-rescheduled to 8/28 @ 10:30am per Jack [name]  . CORONARY ARTERY BYPASS GRAFT  2008   LIMA to LAD, SVG to ramus, SVG to PDA and PLA  . EXTRACORPOREAL SHOCK WAVE LITHOTRIPSY Left 12/02/2018   Procedure: EXTRACORPOREAL SHOCK WAVE LITHOTRIPSY (ESWL);  Surgeon: Lucas Mallow, MD;  Location: [facility];  Service: Urology;  Laterality: Left;  . FOOT SURGERY    . KNEE ARTHROPLASTY    . POLYPECTOMY  04/21/2018   Procedure: POLYPECTOMY;  Surgeon: Rogene Houston, MD;  Location: [facility];  Service: Endoscopy;;  . ROTATOR CUFF REPAIR    . SHOULDER SURGERY       Current Meds  Medication Sig  . amLODipine (NORVASC) 5 MG tablet Take 5 mg by mouth every other day.   Marland Kitchen aspirin EC 81 MG tablet Take 1 tablet (81 mg total) by mouth daily.  . benazepril (LOTENSIN) 5 MG tablet Take 5 mg by mouth daily.   . Cholecalciferol (VITAMIN D3) 1000 units CAPS Take 1,000 Units by mouth daily.  . citalopram (CELEXA) 40 MG tablet Take 1 tablet (40 mg total) by mouth daily.  . furosemide (LASIX)  20 MG tablet Take 20 mg by mouth daily.   . nitroGLYCERIN (NITROSTAT) 0.4 MG SL tablet Place 1 tablet (0.4 mg total) under the tongue every 5 (five) minutes x 3 doses as needed for chest pain (if no relief after 3rd dose, proceed to the ED for an evaluation or call 911).  Marland Kitchen oxyCODONE-acetaminophen (PERCOCET) 10-325 MG tablet Take 1 tablet by mouth every 6 (six) hours as needed for pain. Must last 30 days.  . polyethylene glycol (MIRALAX / GLYCOLAX) packet Take 17 g by mouth daily.   . rosuvastatin (CRESTOR) 20 MG tablet Take 20 mg by mouth daily.  . traZODone (DESYREL) 50 MG tablet Take 50 mg by mouth at bedtime.       Allergies:   Patient has no known allergies.   ROS: No syncope.  Prior CV studies:   The following studies were reviewed today:  Lexiscan Myoview 03/21/2015:  Findings consistent with prior myocardial infarction. Evidence of prior inferior infarct with no peri-infarct ischemia  This is an intermediate risk study. Intermediate risk due to mildly decreased LVEF, there is no current myocardium at jeopardy.  The left ventricular ejection fraction is mildly decreased (45-54%).  Labs/Other Tests and Data Reviewed:    EKG:  An ECG dated 11/14/2018 was personally reviewed today and demonstrated:  Sinus bradycardia.  Recent Labs:  March 2021: Cholesterol 165, triglycerides 103, HDL 35, LDL 111, BUN 12, creatinine 1.11, potassium 4.2  Wt Readings from Last 3 Encounters:  06/29/20 202 lb (91.6 kg)  04/10/20 200 lb (90.7 kg)  01/02/20 207 lb (93.9 kg)     Objective:    Vital Signs:  BP 121/83   Pulse (!) 52   Ht 5\' 10"  (1.778 m)   Wt 202 lb (91.6 kg)   SpO2 96%   BMI 28.98 kg/m    Patient spoke in complete sentences, no audible shortness of breath.  ASSESSMENT & PLAN:    1.  Multivessel CAD status post CABG in 2009.  He reports no angina or nitroglycerin use.  Continue observation on medical therapy.  He is on aspirin, Norvasc, Lotensin, and Crestor.  2.  Mixed hyperlipidemia, currently on Crestor 20 mg daily.  Last LDL per PCP was 111.  Follow-up FLP.  May need further up titration of therapy.  Time:   Today, I have spent 5 minutes with the patient with telehealth technology discussing the above problems.     Medication Adjustments/Labs and Tests Ordered: Current medicines are reviewed at length with the patient today.  Concerns regarding medicines are outlined above.   Tests Ordered: No orders of the defined types were placed in this encounter.   Medication Changes: No orders of the defined types were placed in this encounter.   Follow Up:  In Person 6 months in  the [place] office.  Signed, Rozann Lesches, MD  06/29/2020 9:48 AM    [place]

## 2020-06-29 ENCOUNTER — Telehealth (INDEPENDENT_AMBULATORY_CARE_PROVIDER_SITE_OTHER): Payer: Medicare HMO | Admitting: Cardiology

## 2020-06-29 ENCOUNTER — Encounter: Payer: Self-pay | Admitting: Cardiology

## 2020-06-29 VITALS — BP 121/83 | HR 52 | Ht 70.0 in | Wt 202.0 lb

## 2020-06-29 DIAGNOSIS — E785 Hyperlipidemia, unspecified: Secondary | ICD-10-CM

## 2020-06-29 DIAGNOSIS — I25119 Atherosclerotic heart disease of native coronary artery with unspecified angina pectoris: Secondary | ICD-10-CM

## 2020-06-29 NOTE — Patient Instructions (Signed)
Medication Instructions:  Your physician recommends that you continue on your current medications as directed. Please refer to the Current Medication list given to you today.  *If you need a refill on your cardiac medications before your next appointment, please call your pharmacy*   Lab Work: Fasting lipids   If you have labs (blood work) drawn today and your tests are completely normal, you will receive your results only by:  [place] (if you have MyChart) OR  A paper copy in the mail If you have any lab test that is abnormal or we need to change your treatment, we will call you to review the results.   Testing/Procedures: None today   Follow-Up: At [facility], you and your health needs are our priority.  As part of our continuing mission to provide you with exceptional heart care, we have created designated Provider Care Teams.  These Care Teams include your primary Cardiologist (physician) and Advanced Practice Providers (APPs -  Physician Assistants and Nurse Practitioners) who all work together to provide you with the care you need, when you need it.  We recommend signing up for the patient portal called "MyChart".  Sign up information is provided on this After Visit Summary.  MyChart is used to connect with patients for Virtual Visits (Telemedicine).  Patients are able to view lab/test results, encounter notes, upcoming appointments, etc.  Non-urgent messages can be sent to your provider as well.   To learn more about what you can do with MyChart, go to [email or web address].    Your next appointment:   6 month(s)  The format for your next appointment:   In Person  Provider:   Katina Dung, NP   Other Instructions None       Thank you for choosing [facility] !

## 2020-07-05 ENCOUNTER — Other Ambulatory Visit: Payer: Self-pay

## 2020-07-05 ENCOUNTER — Encounter: Payer: Self-pay | Admitting: Student in an Organized Health Care Education/Training Program

## 2020-07-05 ENCOUNTER — Ambulatory Visit
Payer: Medicare HMO | Attending: Student in an Organized Health Care Education/Training Program | Admitting: Student in an Organized Health Care Education/Training Program

## 2020-07-05 DIAGNOSIS — G894 Chronic pain syndrome: Secondary | ICD-10-CM | POA: Diagnosis not present

## 2020-07-05 DIAGNOSIS — M19011 Primary osteoarthritis, right shoulder: Secondary | ICD-10-CM

## 2020-07-05 DIAGNOSIS — Z79899 Other long term (current) drug therapy: Secondary | ICD-10-CM | POA: Diagnosis not present

## 2020-07-05 DIAGNOSIS — Z79891 Long term (current) use of opiate analgesic: Secondary | ICD-10-CM | POA: Diagnosis not present

## 2020-07-05 DIAGNOSIS — M25512 Pain in left shoulder: Secondary | ICD-10-CM

## 2020-07-05 DIAGNOSIS — M25511 Pain in right shoulder: Secondary | ICD-10-CM | POA: Diagnosis not present

## 2020-07-05 DIAGNOSIS — M19012 Primary osteoarthritis, left shoulder: Secondary | ICD-10-CM | POA: Diagnosis not present

## 2020-07-05 DIAGNOSIS — M5136 Other intervertebral disc degeneration, lumbar region: Secondary | ICD-10-CM | POA: Diagnosis not present

## 2020-07-05 DIAGNOSIS — M170 Bilateral primary osteoarthritis of knee: Secondary | ICD-10-CM | POA: Diagnosis not present

## 2020-07-05 DIAGNOSIS — M47816 Spondylosis without myelopathy or radiculopathy, lumbar region: Secondary | ICD-10-CM

## 2020-07-05 DIAGNOSIS — G8929 Other chronic pain: Secondary | ICD-10-CM

## 2020-07-05 MED ORDER — OXYCODONE-ACETAMINOPHEN 10-325 MG PO TABS: 1.0000 | ORAL_TABLET | Freq: Four times a day (QID) | ORAL | 0 refills | Status: AC | PRN

## 2020-07-05 NOTE — Progress Notes (Signed)
Patient: Jack [name]  Service Category: E/M  Provider: [name], MD  DOB: 30-Jul-1946  DOS: 07/05/2020  Location: Office  MRN: [id number]  Setting: Ambulatory outpatient  Referring Provider: Neale Burly, MD  Type: Established Patient  Specialty: Interventional Pain Management  PCP: Neale Burly, MD  Location: Home  Delivery: TeleHealth     Virtual Encounter - Pain Management PROVIDER NOTE: Information contained herein reflects review and annotations entered in association with encounter. Interpretation of such information and data should be left to medically-trained personnel. Information provided to patient can be located elsewhere in the medical record under "Patient Instructions". Document created using STT-dictation technology, any transcriptional errors that may result from process are unintentional.    Contact & Pharmacy Preferred: [phone] Home: [phone] (home) Mobile: [phone] (mobile) E-mail: No e-mail address on record  [place], [place] - [address] [place] [place] [address] Phone: [phone] Fax: [phone]   Pre-screening  Jack [name] offered "in-person" vs "virtual" encounter. He indicated preferring virtual for this encounter.   Reason COVID-19*  Social distancing based on CDC and AMA recommendations.   I contacted Jack [name] on 07/05/2020 via video conference.      I clearly identified myself as [name], MD. I verified that I was speaking with the correct person using two identifiers (Name: Jack [name], and date of birth: 08-11-1946).  Consent I sought verbal advanced consent from Jack [name] for virtual visit interactions. I informed Jack [name] of possible security and privacy concerns, risks, and limitations associated with providing "not-in-person" medical evaluation and management services. I also informed Jack [name] of the availability of "in-person" appointments. Finally, I informed him that there  would be a charge for the virtual visit and that he could be  personally, fully or partially, financially responsible for it. Jack [name] expressed understanding and agreed to proceed.   Historic Elements   Jack [name] is a 74 y.o. year old, male patient evaluated today after our last contact on 04/10/2020. Jack [name]  has a past medical history of Bradycardia, Coronary atherosclerosis of native coronary artery, History of kidney stones, Hyperlipidemia, Hypertension, and Tobacco user. He also  has a past surgical history that includes Foot surgery; Appendectomy; Knee Arthroplasty; Coronary artery bypass graft (2008); Rotator cuff repair; Shoulder surgery; Cholecystectomy (2003); Colonoscopy (N/A, 04/21/2018); polypectomy (04/21/2018); and Extracorporeal shock wave lithotripsy (Left, 12/02/2018). Jack [name] has a current medication list which includes the following prescription(s): amlodipine, aspirin ec, benazepril, vitamin d3, citalopram, furosemide, nitroglycerin, [START ON 07/15/2020] oxycodone-acetaminophen, [START ON 08/14/2020] oxycodone-acetaminophen, polyethylene glycol, rosuvastatin, and trazodone. He  reports that he has been smoking cigarettes. He started smoking about 61 years ago. He has a 57.00 pack-year smoking history. He has never used smokeless tobacco. He reports current alcohol use. He reports that he does not use drugs. Jack [name] has No Known Allergies.   HPI  Today, he is being contacted for medication management.   Virtual visit today for medication management.  Patient states that he sprained his ankle getting out of his bathtub on Tuesday.  He is having swelling in his ankle as well as increased pain.  He states that for this reason he was unable to come into the pain clinic for his medication  refill. Continues to take oxycodone as prescribed.  No change in dose.  Endorses analgesic and functional benefit with medication.  No side effects noted. I recommended rest, ice,  compression, elevation to the  affected ankle.  Pharmacotherapy Assessment  Analgesic: 06/15/2020  04/10/2020   1  Oxycodone-Acetaminophen 10-325 90.00  30  Bi Lat  [id number]  Mit (3484)  0/0  45.00 MME  Medicare  [place]      Monitoring: [place] P[place]: PDMP reviewed during this encounter.       Pharmacotherapy: No side-effects or adverse reactions reported. Compliance: No problems identified. Effectiveness: Clinically acceptable. Plan: Refer to "POC".  UDS:  Summary  Date Value Ref Range Status  11/03/2019 Note  Final    Comment:    ==================================================================== [employer] Select 13 (MW) ==================================================================== Test                             Result       Flag       Units Drug Present and Declared for Prescription Verification   Oxycodone                      3843         EXPECTED   ng/mg creat   Oxymorphone                    626          EXPECTED   ng/mg creat   Noroxycodone                   >6098        EXPECTED   ng/mg creat   Noroxymorphone                 221          EXPECTED   ng/mg creat    Sources of oxycodone are scheduled prescription medications.    Oxymorphone, noroxycodone, and noroxymorphone are expected    metabolites of oxycodone. Oxymorphone is also available as a    scheduled prescription medication. ==================================================================== Test                      Result    Flag   Units      Ref Range   Creatinine              164              mg/dL      >=20 ==================================================================== Declared Medications:  The flagging and interpretation on this report are based on the  following declared medications.  Unexpected results may arise from  inaccuracies in the declared medications.  **Note: The testing scope of this panel includes these medications:  Oxycodone  **Note: The testing scope of this panel does not  include the  following reported medications:  Acetaminophen  Amlodipine  Aspirin  Benazepril  Citalopram (Celexa)  Furosemide  Nitroglycerin  Polyethylene Glycol  Rosuvastatin  Trazodone  Vitamin D3 ==================================================================== For clinical consultation, please call [phone]. ====================================================================     Laboratory Chemistry Profile   Renal Lab Results  Component Value Date   BUN 13 01/23/2018   CREATININE 1.17 50/38/8828   BCR NOT APPLICABLE 00/34/9179   GFRAA >60 01/23/2018   GFRNONAA >60 01/23/2018     Hepatic Lab Results  Component Value Date   AST 14 (L) 01/23/2018   ALT 10 (L) 01/23/2018   ALBUMIN 3.0 (L) 01/23/2018   ALKPHOS 52 01/23/2018   LIPASE 20 01/23/2018     Electrolytes Lab Results  Component Value Date   NA 140 01/23/2018  K 3.0 (L) 01/23/2018   CL 103 01/23/2018   CALCIUM 8.6 (L) 01/23/2018     Bone Lab Results  Component Value Date   VD25OH 47 05/06/2017     Inflammation (CRP: Acute Phase) (ESR: Chronic Phase) No results found for: CRP, ESRSEDRATE, LATICACIDVEN     Note: Above Lab results reviewed.  Imaging  DG Abd 1 View CLINICAL DATA:  Pre lithotripsy.  Left ureteral calculus.  EXAM: ABDOMEN - 1 VIEW  COMPARISON:  None.  FINDINGS: Stable 1.3 cm calculus in the left renal pelvis. There is no disproportionate dilatation of bowel. There is no obvious free intraperitoneal gas. Vascular calcifications are noted.  IMPRESSION: Left nephrolithiasis.  Nonobstructive bowel gas pattern.  Electronically Signed   By: Marybelle Killings M.D.   On: 12/02/2018 10:11  Assessment  The primary encounter diagnosis was Lumbar degenerative disc disease. Diagnoses of Osteoarthritis of both shoulders, unspecified osteoarthritis type, Lumbar spondylosis, Chronic prescription benzodiazepine use, Long term prescription opiate use, Primary osteoarthritis of  both knees, Chronic pain of both shoulders, and Chronic pain syndrome were also pertinent to this visit.  Plan of Care   Jack [name] has a current medication list which includes the following long-term medication(s): citalopram and nitroglycerin.  Pharmacotherapy (Medications Ordered): Meds ordered this encounter  Medications  . oxyCODONE-acetaminophen (PERCOCET) 10-325 MG tablet    Sig: Take 1 tablet by mouth every 6 (six) hours as needed for pain. Must last 30 days.    Dispense:  90 tablet    Refill:  0    Chronic Pain. (STOP Act - Not applicable). Fill one day early if closed on scheduled refill date.  Marland Kitchen oxyCODONE-acetaminophen (PERCOCET) 10-325 MG tablet    Sig: Take 1 tablet by mouth every 6 (six) hours as needed for pain. Must last 30 days.    Dispense:  90 tablet    Refill:  0    Chronic Pain. (STOP Act - Not applicable). Fill one day early if closed on scheduled refill date.   Continue Celexa 40 mg for depression.  Follow-up plan:   Return in about 2 months (around 09/09/2020) for Medication Management, in person.   Recent Visits Date Type Provider Dept  04/10/20 Office Visit [name], MD [facility]  Showing recent visits within past 90 days and meeting all other requirements Today's Visits Date Type Provider Dept  07/05/20 Telemedicine [name], MD [facility]  Showing today's visits and meeting all other requirements Future Appointments No visits were found meeting these conditions. Showing future appointments within next 90 days and meeting all other requirements  I discussed the assessment and treatment plan with the patient. The patient was provided an opportunity to ask questions and all were answered. The patient agreed with the plan and demonstrated an understanding of the instructions.  Patient advised to call back or seek an in-person evaluation if the symptoms or condition worsens.  Duration of encounter: 30  minutes.  Note by: [name], MD Date: 07/05/2020; Time: 10:59 AM

## 2020-07-09 ENCOUNTER — Ambulatory Visit: Payer: Medicare HMO | Admitting: Cardiology

## 2020-07-12 ENCOUNTER — Encounter: Payer: Medicare HMO | Admitting: Student in an Organized Health Care Education/Training Program

## 2020-09-06 ENCOUNTER — Encounter: Payer: Medicare HMO | Admitting: Student in an Organized Health Care Education/Training Program

## 2020-09-13 ENCOUNTER — Encounter: Payer: Medicare HMO | Admitting: Student in an Organized Health Care Education/Training Program

## 2020-09-21 DIAGNOSIS — J449 Chronic obstructive pulmonary disease, unspecified: Secondary | ICD-10-CM | POA: Diagnosis not present

## 2020-09-21 DIAGNOSIS — I10 Essential (primary) hypertension: Secondary | ICD-10-CM | POA: Diagnosis not present

## 2020-09-21 DIAGNOSIS — I2510 Atherosclerotic heart disease of native coronary artery without angina pectoris: Secondary | ICD-10-CM | POA: Diagnosis not present

## 2020-09-21 DIAGNOSIS — U071 COVID-19: Secondary | ICD-10-CM | POA: Diagnosis not present

## 2020-09-21 DIAGNOSIS — F17210 Nicotine dependence, cigarettes, uncomplicated: Secondary | ICD-10-CM | POA: Diagnosis not present

## 2020-09-21 DIAGNOSIS — Z79899 Other long term (current) drug therapy: Secondary | ICD-10-CM | POA: Diagnosis not present

## 2020-09-21 DIAGNOSIS — R059 Cough, unspecified: Secondary | ICD-10-CM | POA: Diagnosis not present

## 2020-09-21 DIAGNOSIS — N390 Urinary tract infection, site not specified: Secondary | ICD-10-CM | POA: Diagnosis not present

## 2020-09-21 DIAGNOSIS — B349 Viral infection, unspecified: Secondary | ICD-10-CM | POA: Diagnosis not present

## 2020-09-26 DIAGNOSIS — F330 Major depressive disorder, recurrent, mild: Secondary | ICD-10-CM | POA: Diagnosis not present

## 2020-09-26 DIAGNOSIS — I5032 Chronic diastolic (congestive) heart failure: Secondary | ICD-10-CM | POA: Diagnosis not present

## 2020-09-26 DIAGNOSIS — I110 Hypertensive heart disease with heart failure: Secondary | ICD-10-CM | POA: Diagnosis not present

## 2020-09-26 DIAGNOSIS — Z951 Presence of aortocoronary bypass graft: Secondary | ICD-10-CM | POA: Diagnosis not present

## 2020-09-26 DIAGNOSIS — I739 Peripheral vascular disease, unspecified: Secondary | ICD-10-CM | POA: Diagnosis not present

## 2020-09-26 DIAGNOSIS — E785 Hyperlipidemia, unspecified: Secondary | ICD-10-CM | POA: Diagnosis not present

## 2020-09-26 DIAGNOSIS — I25118 Atherosclerotic heart disease of native coronary artery with other forms of angina pectoris: Secondary | ICD-10-CM | POA: Diagnosis not present

## 2020-09-26 DIAGNOSIS — J449 Chronic obstructive pulmonary disease, unspecified: Secondary | ICD-10-CM | POA: Diagnosis not present

## 2020-11-12 DIAGNOSIS — E1165 Type 2 diabetes mellitus with hyperglycemia: Secondary | ICD-10-CM | POA: Diagnosis not present

## 2020-11-21 DIAGNOSIS — D692 Other nonthrombocytopenic purpura: Secondary | ICD-10-CM | POA: Diagnosis not present

## 2020-11-21 DIAGNOSIS — I110 Hypertensive heart disease with heart failure: Secondary | ICD-10-CM | POA: Diagnosis not present

## 2020-11-21 DIAGNOSIS — Z951 Presence of aortocoronary bypass graft: Secondary | ICD-10-CM | POA: Diagnosis not present

## 2020-11-21 DIAGNOSIS — Z515 Encounter for palliative care: Secondary | ICD-10-CM | POA: Diagnosis not present

## 2020-11-21 DIAGNOSIS — I2510 Atherosclerotic heart disease of native coronary artery without angina pectoris: Secondary | ICD-10-CM | POA: Diagnosis not present

## 2020-11-21 DIAGNOSIS — F330 Major depressive disorder, recurrent, mild: Secondary | ICD-10-CM | POA: Diagnosis not present

## 2020-11-21 DIAGNOSIS — Z8679 Personal history of other diseases of the circulatory system: Secondary | ICD-10-CM | POA: Diagnosis not present

## 2020-11-21 DIAGNOSIS — I5032 Chronic diastolic (congestive) heart failure: Secondary | ICD-10-CM | POA: Diagnosis not present

## 2020-12-10 DIAGNOSIS — I10 Essential (primary) hypertension: Secondary | ICD-10-CM | POA: Diagnosis not present

## 2021-01-04 DIAGNOSIS — F330 Major depressive disorder, recurrent, mild: Secondary | ICD-10-CM | POA: Diagnosis not present

## 2021-01-04 DIAGNOSIS — J449 Chronic obstructive pulmonary disease, unspecified: Secondary | ICD-10-CM | POA: Diagnosis not present

## 2021-01-04 DIAGNOSIS — I5032 Chronic diastolic (congestive) heart failure: Secondary | ICD-10-CM | POA: Diagnosis not present

## 2021-01-04 DIAGNOSIS — I2510 Atherosclerotic heart disease of native coronary artery without angina pectoris: Secondary | ICD-10-CM | POA: Diagnosis not present

## 2021-01-04 DIAGNOSIS — F17210 Nicotine dependence, cigarettes, uncomplicated: Secondary | ICD-10-CM | POA: Diagnosis not present

## 2021-01-04 DIAGNOSIS — Z951 Presence of aortocoronary bypass graft: Secondary | ICD-10-CM | POA: Diagnosis not present

## 2021-01-04 DIAGNOSIS — Z8679 Personal history of other diseases of the circulatory system: Secondary | ICD-10-CM | POA: Diagnosis not present

## 2021-01-04 DIAGNOSIS — Z515 Encounter for palliative care: Secondary | ICD-10-CM | POA: Diagnosis not present

## 2021-01-16 DIAGNOSIS — F330 Major depressive disorder, recurrent, mild: Secondary | ICD-10-CM | POA: Diagnosis not present

## 2021-01-16 DIAGNOSIS — F17200 Nicotine dependence, unspecified, uncomplicated: Secondary | ICD-10-CM | POA: Diagnosis not present

## 2021-01-16 DIAGNOSIS — D692 Other nonthrombocytopenic purpura: Secondary | ICD-10-CM | POA: Diagnosis not present

## 2021-01-16 DIAGNOSIS — Z515 Encounter for palliative care: Secondary | ICD-10-CM | POA: Diagnosis not present

## 2021-01-22 DIAGNOSIS — I10 Essential (primary) hypertension: Secondary | ICD-10-CM | POA: Diagnosis not present

## 2021-01-22 DIAGNOSIS — F17200 Nicotine dependence, unspecified, uncomplicated: Secondary | ICD-10-CM | POA: Diagnosis not present

## 2021-01-22 DIAGNOSIS — Z515 Encounter for palliative care: Secondary | ICD-10-CM | POA: Diagnosis not present

## 2021-01-22 DIAGNOSIS — F330 Major depressive disorder, recurrent, mild: Secondary | ICD-10-CM | POA: Diagnosis not present

## 2021-01-24 DIAGNOSIS — F39 Unspecified mood [affective] disorder: Secondary | ICD-10-CM | POA: Diagnosis not present

## 2021-01-24 DIAGNOSIS — F419 Anxiety disorder, unspecified: Secondary | ICD-10-CM | POA: Diagnosis not present

## 2021-01-24 DIAGNOSIS — F17210 Nicotine dependence, cigarettes, uncomplicated: Secondary | ICD-10-CM | POA: Diagnosis not present

## 2021-03-01 DIAGNOSIS — F17200 Nicotine dependence, unspecified, uncomplicated: Secondary | ICD-10-CM | POA: Diagnosis not present

## 2021-03-01 DIAGNOSIS — F419 Anxiety disorder, unspecified: Secondary | ICD-10-CM | POA: Diagnosis not present

## 2021-03-01 DIAGNOSIS — F39 Unspecified mood [affective] disorder: Secondary | ICD-10-CM | POA: Diagnosis not present

## 2021-03-21 DIAGNOSIS — F330 Major depressive disorder, recurrent, mild: Secondary | ICD-10-CM | POA: Diagnosis not present

## 2021-03-21 DIAGNOSIS — I739 Peripheral vascular disease, unspecified: Secondary | ICD-10-CM | POA: Diagnosis not present

## 2021-03-21 DIAGNOSIS — E785 Hyperlipidemia, unspecified: Secondary | ICD-10-CM | POA: Diagnosis not present

## 2021-03-21 DIAGNOSIS — F17200 Nicotine dependence, unspecified, uncomplicated: Secondary | ICD-10-CM | POA: Diagnosis not present

## 2021-03-21 DIAGNOSIS — D692 Other nonthrombocytopenic purpura: Secondary | ICD-10-CM | POA: Diagnosis not present

## 2021-03-21 DIAGNOSIS — Z515 Encounter for palliative care: Secondary | ICD-10-CM | POA: Diagnosis not present

## 2021-03-21 DIAGNOSIS — F419 Anxiety disorder, unspecified: Secondary | ICD-10-CM | POA: Diagnosis not present

## 2021-03-21 DIAGNOSIS — F39 Unspecified mood [affective] disorder: Secondary | ICD-10-CM | POA: Diagnosis not present

## 2021-04-09 DIAGNOSIS — F39 Unspecified mood [affective] disorder: Secondary | ICD-10-CM | POA: Diagnosis not present

## 2021-04-09 DIAGNOSIS — F419 Anxiety disorder, unspecified: Secondary | ICD-10-CM | POA: Diagnosis not present

## 2021-04-09 DIAGNOSIS — F17200 Nicotine dependence, unspecified, uncomplicated: Secondary | ICD-10-CM | POA: Diagnosis not present

## 2023-09-03 DIAGNOSIS — L0390 Cellulitis, unspecified: Secondary | ICD-10-CM | POA: Diagnosis not present

## 2023-09-03 DIAGNOSIS — E785 Hyperlipidemia, unspecified: Secondary | ICD-10-CM | POA: Diagnosis not present

## 2023-09-03 DIAGNOSIS — Z79899 Other long term (current) drug therapy: Secondary | ICD-10-CM | POA: Diagnosis not present

## 2023-09-03 DIAGNOSIS — R5381 Other malaise: Secondary | ICD-10-CM | POA: Diagnosis not present

## 2023-09-03 DIAGNOSIS — S81801A Unspecified open wound, right lower leg, initial encounter: Secondary | ICD-10-CM | POA: Diagnosis not present

## 2023-09-03 DIAGNOSIS — S91302A Unspecified open wound, left foot, initial encounter: Secondary | ICD-10-CM | POA: Diagnosis not present

## 2023-09-03 DIAGNOSIS — M7989 Other specified soft tissue disorders: Secondary | ICD-10-CM | POA: Diagnosis not present

## 2023-09-03 DIAGNOSIS — Z6841 Body Mass Index (BMI) 40.0 and over, adult: Secondary | ICD-10-CM | POA: Diagnosis not present

## 2023-09-03 DIAGNOSIS — L89619 Pressure ulcer of right heel, unspecified stage: Secondary | ICD-10-CM | POA: Diagnosis not present

## 2023-09-03 DIAGNOSIS — L03115 Cellulitis of right lower limb: Secondary | ICD-10-CM | POA: Diagnosis not present

## 2023-09-03 DIAGNOSIS — S91301A Unspecified open wound, right foot, initial encounter: Secondary | ICD-10-CM | POA: Diagnosis not present

## 2023-09-03 DIAGNOSIS — L89613 Pressure ulcer of right heel, stage 3: Secondary | ICD-10-CM | POA: Diagnosis not present

## 2023-09-03 DIAGNOSIS — R7989 Other specified abnormal findings of blood chemistry: Secondary | ICD-10-CM | POA: Diagnosis not present

## 2023-09-03 DIAGNOSIS — L97411 Non-pressure chronic ulcer of right heel and midfoot limited to breakdown of skin: Secondary | ICD-10-CM | POA: Diagnosis not present

## 2023-09-03 DIAGNOSIS — R609 Edema, unspecified: Secondary | ICD-10-CM | POA: Diagnosis not present

## 2023-09-03 DIAGNOSIS — I739 Peripheral vascular disease, unspecified: Secondary | ICD-10-CM | POA: Diagnosis not present

## 2023-09-03 DIAGNOSIS — I890 Lymphedema, not elsewhere classified: Secondary | ICD-10-CM | POA: Diagnosis not present

## 2023-09-03 DIAGNOSIS — R54 Age-related physical debility: Secondary | ICD-10-CM | POA: Diagnosis not present

## 2023-09-03 DIAGNOSIS — E8770 Fluid overload, unspecified: Secondary | ICD-10-CM | POA: Diagnosis not present

## 2023-09-03 DIAGNOSIS — Z91199 Patient's noncompliance with other medical treatment and regimen due to unspecified reason: Secondary | ICD-10-CM | POA: Diagnosis not present

## 2023-09-03 DIAGNOSIS — I340 Nonrheumatic mitral (valve) insufficiency: Secondary | ICD-10-CM | POA: Diagnosis not present

## 2023-09-03 DIAGNOSIS — Z91198 Patient's noncompliance with other medical treatment and regimen for other reason: Secondary | ICD-10-CM | POA: Diagnosis not present

## 2023-09-03 DIAGNOSIS — I517 Cardiomegaly: Secondary | ICD-10-CM | POA: Diagnosis not present

## 2023-09-03 DIAGNOSIS — L03116 Cellulitis of left lower limb: Secondary | ICD-10-CM | POA: Diagnosis not present

## 2023-09-03 DIAGNOSIS — I2510 Atherosclerotic heart disease of native coronary artery without angina pectoris: Secondary | ICD-10-CM | POA: Diagnosis not present

## 2023-09-03 DIAGNOSIS — Z792 Long term (current) use of antibiotics: Secondary | ICD-10-CM | POA: Diagnosis not present

## 2023-09-03 DIAGNOSIS — S81802A Unspecified open wound, left lower leg, initial encounter: Secondary | ICD-10-CM | POA: Diagnosis not present

## 2023-09-03 DIAGNOSIS — Z66 Do not resuscitate: Secondary | ICD-10-CM | POA: Diagnosis not present

## 2023-09-03 DIAGNOSIS — F32A Depression, unspecified: Secondary | ICD-10-CM | POA: Diagnosis not present

## 2023-09-03 DIAGNOSIS — R600 Localized edema: Secondary | ICD-10-CM | POA: Diagnosis not present

## 2023-09-03 DIAGNOSIS — I110 Hypertensive heart disease with heart failure: Secondary | ICD-10-CM | POA: Diagnosis not present

## 2023-09-03 DIAGNOSIS — S91309A Unspecified open wound, unspecified foot, initial encounter: Secondary | ICD-10-CM | POA: Diagnosis not present

## 2023-09-03 DIAGNOSIS — Z7901 Long term (current) use of anticoagulants: Secondary | ICD-10-CM | POA: Diagnosis not present

## 2023-09-03 DIAGNOSIS — I509 Heart failure, unspecified: Secondary | ICD-10-CM | POA: Diagnosis not present

## 2023-09-03 DIAGNOSIS — L97921 Non-pressure chronic ulcer of unspecified part of left lower leg limited to breakdown of skin: Secondary | ICD-10-CM | POA: Diagnosis not present

## 2023-09-03 DIAGNOSIS — R627 Adult failure to thrive: Secondary | ICD-10-CM | POA: Diagnosis not present

## 2023-09-03 DIAGNOSIS — R410 Disorientation, unspecified: Secondary | ICD-10-CM | POA: Diagnosis not present

## 2023-09-03 DIAGNOSIS — Z720 Tobacco use: Secondary | ICD-10-CM | POA: Diagnosis not present

## 2023-09-03 DIAGNOSIS — I96 Gangrene, not elsewhere classified: Secondary | ICD-10-CM | POA: Diagnosis not present

## 2023-09-03 DIAGNOSIS — I10 Essential (primary) hypertension: Secondary | ICD-10-CM | POA: Diagnosis not present

## 2023-09-03 DIAGNOSIS — N289 Disorder of kidney and ureter, unspecified: Secondary | ICD-10-CM | POA: Diagnosis not present

## 2023-09-07 ENCOUNTER — Encounter (HOSPITAL_COMMUNITY): Payer: Self-pay

## 2023-09-11 ENCOUNTER — Inpatient Hospital Stay (HOSPITAL_COMMUNITY)
Admission: RE | Admit: 2023-09-11 | Discharge: 2023-10-07 | DRG: 300 | Disposition: A | Discharge status: Skilled Nursing Facility | Payer: Medicare HMO | Source: Other Acute Inpatient Hospital | Attending: Family Medicine | Admitting: Family Medicine

## 2023-09-11 DIAGNOSIS — I2510 Atherosclerotic heart disease of native coronary artery without angina pectoris: Secondary | ICD-10-CM | POA: Diagnosis present

## 2023-09-11 DIAGNOSIS — R531 Weakness: Secondary | ICD-10-CM | POA: Diagnosis not present

## 2023-09-11 DIAGNOSIS — Z66 Do not resuscitate: Secondary | ICD-10-CM | POA: Diagnosis not present

## 2023-09-11 DIAGNOSIS — E669 Obesity, unspecified: Secondary | ICD-10-CM | POA: Diagnosis present

## 2023-09-11 DIAGNOSIS — D631 Anemia in chronic kidney disease: Secondary | ICD-10-CM | POA: Diagnosis present

## 2023-09-11 DIAGNOSIS — L89322 Pressure ulcer of left buttock, stage 2: Secondary | ICD-10-CM | POA: Diagnosis present

## 2023-09-11 DIAGNOSIS — R410 Disorientation, unspecified: Secondary | ICD-10-CM | POA: Diagnosis not present

## 2023-09-11 DIAGNOSIS — R41841 Cognitive communication deficit: Secondary | ICD-10-CM | POA: Diagnosis not present

## 2023-09-11 DIAGNOSIS — Z7982 Long term (current) use of aspirin: Secondary | ICD-10-CM

## 2023-09-11 DIAGNOSIS — Z8249 Family history of ischemic heart disease and other diseases of the circulatory system: Secondary | ICD-10-CM | POA: Diagnosis not present

## 2023-09-11 DIAGNOSIS — I70235 Atherosclerosis of native arteries of right leg with ulceration of other part of foot: Secondary | ICD-10-CM | POA: Diagnosis not present

## 2023-09-11 DIAGNOSIS — K5901 Slow transit constipation: Secondary | ICD-10-CM | POA: Diagnosis not present

## 2023-09-11 DIAGNOSIS — R278 Other lack of coordination: Secondary | ICD-10-CM | POA: Diagnosis not present

## 2023-09-11 DIAGNOSIS — E440 Moderate protein-calorie malnutrition: Secondary | ICD-10-CM | POA: Insufficient documentation

## 2023-09-11 DIAGNOSIS — M170 Bilateral primary osteoarthritis of knee: Secondary | ICD-10-CM | POA: Diagnosis not present

## 2023-09-11 DIAGNOSIS — R4689 Other symptoms and signs involving appearance and behavior: Secondary | ICD-10-CM | POA: Diagnosis present

## 2023-09-11 DIAGNOSIS — F17210 Nicotine dependence, cigarettes, uncomplicated: Secondary | ICD-10-CM | POA: Diagnosis present

## 2023-09-11 DIAGNOSIS — Z91198 Patient's noncompliance with other medical treatment and regimen for other reason: Secondary | ICD-10-CM | POA: Diagnosis not present

## 2023-09-11 DIAGNOSIS — R1312 Dysphagia, oropharyngeal phase: Secondary | ICD-10-CM | POA: Diagnosis not present

## 2023-09-11 DIAGNOSIS — L97429 Non-pressure chronic ulcer of left heel and midfoot with unspecified severity: Secondary | ICD-10-CM | POA: Diagnosis present

## 2023-09-11 DIAGNOSIS — L89151 Pressure ulcer of sacral region, stage 1: Secondary | ICD-10-CM | POA: Diagnosis not present

## 2023-09-11 DIAGNOSIS — R5381 Other malaise: Secondary | ICD-10-CM | POA: Diagnosis not present

## 2023-09-11 DIAGNOSIS — Z515 Encounter for palliative care: Secondary | ICD-10-CM | POA: Diagnosis not present

## 2023-09-11 DIAGNOSIS — R627 Adult failure to thrive: Secondary | ICD-10-CM | POA: Diagnosis not present

## 2023-09-11 DIAGNOSIS — Z91128 Patient's intentional underdosing of medication regimen for other reason: Secondary | ICD-10-CM

## 2023-09-11 DIAGNOSIS — I7090 Unspecified atherosclerosis: Secondary | ICD-10-CM | POA: Diagnosis not present

## 2023-09-11 DIAGNOSIS — B9562 Methicillin resistant Staphylococcus aureus infection as the cause of diseases classified elsewhere: Secondary | ICD-10-CM | POA: Diagnosis present

## 2023-09-11 DIAGNOSIS — E538 Deficiency of other specified B group vitamins: Secondary | ICD-10-CM | POA: Diagnosis present

## 2023-09-11 DIAGNOSIS — R54 Age-related physical debility: Secondary | ICD-10-CM | POA: Diagnosis not present

## 2023-09-11 DIAGNOSIS — S81802A Unspecified open wound, left lower leg, initial encounter: Secondary | ICD-10-CM | POA: Diagnosis not present

## 2023-09-11 DIAGNOSIS — L97519 Non-pressure chronic ulcer of other part of right foot with unspecified severity: Principal | ICD-10-CM | POA: Diagnosis present

## 2023-09-11 DIAGNOSIS — L03116 Cellulitis of left lower limb: Secondary | ICD-10-CM | POA: Diagnosis present

## 2023-09-11 DIAGNOSIS — F32A Depression, unspecified: Secondary | ICD-10-CM | POA: Diagnosis present

## 2023-09-11 DIAGNOSIS — M19071 Primary osteoarthritis, right ankle and foot: Secondary | ICD-10-CM | POA: Diagnosis not present

## 2023-09-11 DIAGNOSIS — L97919 Non-pressure chronic ulcer of unspecified part of right lower leg with unspecified severity: Secondary | ICD-10-CM | POA: Diagnosis not present

## 2023-09-11 DIAGNOSIS — L03115 Cellulitis of right lower limb: Secondary | ICD-10-CM | POA: Diagnosis present

## 2023-09-11 DIAGNOSIS — E785 Hyperlipidemia, unspecified: Secondary | ICD-10-CM | POA: Diagnosis not present

## 2023-09-11 DIAGNOSIS — I70234 Atherosclerosis of native arteries of right leg with ulceration of heel and midfoot: Secondary | ICD-10-CM | POA: Diagnosis not present

## 2023-09-11 DIAGNOSIS — Z951 Presence of aortocoronary bypass graft: Secondary | ICD-10-CM | POA: Diagnosis not present

## 2023-09-11 DIAGNOSIS — L97929 Non-pressure chronic ulcer of unspecified part of left lower leg with unspecified severity: Secondary | ICD-10-CM | POA: Diagnosis not present

## 2023-09-11 DIAGNOSIS — N1831 Chronic kidney disease, stage 3a: Secondary | ICD-10-CM | POA: Diagnosis not present

## 2023-09-11 DIAGNOSIS — I10 Essential (primary) hypertension: Secondary | ICD-10-CM | POA: Diagnosis not present

## 2023-09-11 DIAGNOSIS — L989 Disorder of the skin and subcutaneous tissue, unspecified: Secondary | ICD-10-CM | POA: Diagnosis present

## 2023-09-11 DIAGNOSIS — Z96659 Presence of unspecified artificial knee joint: Secondary | ICD-10-CM | POA: Diagnosis present

## 2023-09-11 DIAGNOSIS — Z7189 Other specified counseling: Secondary | ICD-10-CM | POA: Diagnosis not present

## 2023-09-11 DIAGNOSIS — I6782 Cerebral ischemia: Secondary | ICD-10-CM | POA: Diagnosis not present

## 2023-09-11 DIAGNOSIS — Z79899 Other long term (current) drug therapy: Secondary | ICD-10-CM | POA: Diagnosis not present

## 2023-09-11 DIAGNOSIS — I70263 Atherosclerosis of native arteries of extremities with gangrene, bilateral legs: Principal | ICD-10-CM | POA: Diagnosis present

## 2023-09-11 DIAGNOSIS — N2889 Other specified disorders of kidney and ureter: Secondary | ICD-10-CM | POA: Diagnosis not present

## 2023-09-11 DIAGNOSIS — Z743 Need for continuous supervision: Secondary | ICD-10-CM | POA: Diagnosis not present

## 2023-09-11 DIAGNOSIS — L97419 Non-pressure chronic ulcer of right heel and midfoot with unspecified severity: Secondary | ICD-10-CM | POA: Diagnosis present

## 2023-09-11 DIAGNOSIS — Z5971 Insufficient health insurance coverage: Secondary | ICD-10-CM

## 2023-09-11 DIAGNOSIS — Z792 Long term (current) use of antibiotics: Secondary | ICD-10-CM | POA: Diagnosis not present

## 2023-09-11 DIAGNOSIS — Z6834 Body mass index (BMI) 34.0-34.9, adult: Secondary | ICD-10-CM

## 2023-09-11 DIAGNOSIS — I70244 Atherosclerosis of native arteries of left leg with ulceration of heel and midfoot: Secondary | ICD-10-CM | POA: Diagnosis not present

## 2023-09-11 DIAGNOSIS — F0390 Unspecified dementia without behavioral disturbance: Secondary | ICD-10-CM | POA: Diagnosis present

## 2023-09-11 DIAGNOSIS — I739 Peripheral vascular disease, unspecified: Secondary | ICD-10-CM | POA: Diagnosis not present

## 2023-09-11 DIAGNOSIS — M6281 Muscle weakness (generalized): Secondary | ICD-10-CM | POA: Diagnosis not present

## 2023-09-11 DIAGNOSIS — M2041 Other hammer toe(s) (acquired), right foot: Secondary | ICD-10-CM | POA: Diagnosis not present

## 2023-09-11 DIAGNOSIS — M869 Osteomyelitis, unspecified: Secondary | ICD-10-CM | POA: Diagnosis not present

## 2023-09-11 DIAGNOSIS — I890 Lymphedema, not elsewhere classified: Secondary | ICD-10-CM | POA: Diagnosis not present

## 2023-09-11 DIAGNOSIS — L89613 Pressure ulcer of right heel, stage 3: Secondary | ICD-10-CM | POA: Diagnosis not present

## 2023-09-11 DIAGNOSIS — L89619 Pressure ulcer of right heel, unspecified stage: Secondary | ICD-10-CM | POA: Diagnosis not present

## 2023-09-11 DIAGNOSIS — M19011 Primary osteoarthritis, right shoulder: Secondary | ICD-10-CM | POA: Diagnosis not present

## 2023-09-11 DIAGNOSIS — S81801A Unspecified open wound, right lower leg, initial encounter: Secondary | ICD-10-CM | POA: Diagnosis not present

## 2023-09-11 DIAGNOSIS — R2689 Other abnormalities of gait and mobility: Secondary | ICD-10-CM | POA: Diagnosis not present

## 2023-09-12 ENCOUNTER — Inpatient Hospital Stay (HOSPITAL_COMMUNITY): Payer: Medicare HMO

## 2023-09-12 ENCOUNTER — Other Ambulatory Visit: Payer: Self-pay

## 2023-09-12 ENCOUNTER — Encounter (HOSPITAL_COMMUNITY): Payer: Self-pay | Admitting: Internal Medicine

## 2023-09-12 DIAGNOSIS — I10 Essential (primary) hypertension: Secondary | ICD-10-CM

## 2023-09-12 DIAGNOSIS — R4689 Other symptoms and signs involving appearance and behavior: Secondary | ICD-10-CM | POA: Diagnosis present

## 2023-09-12 DIAGNOSIS — I7090 Unspecified atherosclerosis: Secondary | ICD-10-CM

## 2023-09-12 DIAGNOSIS — L97519 Non-pressure chronic ulcer of other part of right foot with unspecified severity: Principal | ICD-10-CM | POA: Diagnosis present

## 2023-09-12 DIAGNOSIS — L97919 Non-pressure chronic ulcer of unspecified part of right lower leg with unspecified severity: Secondary | ICD-10-CM | POA: Diagnosis not present

## 2023-09-12 DIAGNOSIS — N1831 Chronic kidney disease, stage 3a: Secondary | ICD-10-CM | POA: Diagnosis present

## 2023-09-12 DIAGNOSIS — L97929 Non-pressure chronic ulcer of unspecified part of left lower leg with unspecified severity: Secondary | ICD-10-CM

## 2023-09-12 DIAGNOSIS — I70235 Atherosclerosis of native arteries of right leg with ulceration of other part of foot: Principal | ICD-10-CM | POA: Diagnosis present

## 2023-09-12 DIAGNOSIS — E785 Hyperlipidemia, unspecified: Secondary | ICD-10-CM

## 2023-09-12 LAB — VAS US ABI WITH/WO TBI
Left ABI: 0.32
Right ABI: 0.37

## 2023-09-12 LAB — COMPREHENSIVE METABOLIC PANEL
ALT: 59 U/L — ABNORMAL HIGH (ref 0–44)
AST: 73 U/L — ABNORMAL HIGH (ref 15–41)
Albumin: 2.2 g/dL — ABNORMAL LOW (ref 3.5–5.0)
Alkaline Phosphatase: 94 U/L (ref 38–126)
Anion gap: 9 (ref 5–15)
BUN: 26 mg/dL — ABNORMAL HIGH (ref 8–23)
CO2: 28 mmol/L (ref 22–32)
Calcium: 8.1 mg/dL — ABNORMAL LOW (ref 8.9–10.3)
Chloride: 99 mmol/L (ref 98–111)
Creatinine, Ser: 1.23 mg/dL (ref 0.61–1.24)
GFR, Estimated: >60 mL/min (ref >60)
Glucose, Bld: 114 mg/dL — ABNORMAL HIGH (ref 70–99)
Potassium: 4.3 mmol/L (ref 3.5–5.1)
Sodium: 136 mmol/L (ref 135–145)
Total Bilirubin: 0.9 mg/dL (ref 0.0–1.2)
Total Protein: 5.9 g/dL — ABNORMAL LOW (ref 6.5–8.1)

## 2023-09-12 LAB — PHOSPHORUS: Phosphorus: 2.9 mg/dL (ref 2.5–4.6)

## 2023-09-12 LAB — AMMONIA: Ammonia: 19 umol/L (ref 9–35)

## 2023-09-12 LAB — C-REACTIVE PROTEIN: CRP: 14 mg/dL — ABNORMAL HIGH (ref <1.0)

## 2023-09-12 LAB — CBC WITH DIFFERENTIAL/PLATELET
Abs Immature Granulocytes: 0.08 10*3/uL — ABNORMAL HIGH (ref 0.00–0.07)
Basophils Absolute: 0.1 10*3/uL (ref 0.0–0.1)
Basophils Relative: 0 %
Eosinophils Absolute: 0.1 10*3/uL (ref 0.0–0.5)
Eosinophils Relative: 1 %
HCT: 36.3 % — ABNORMAL LOW (ref 39.0–52.0)
Hemoglobin: 11.6 g/dL — ABNORMAL LOW (ref 13.0–17.0)
Immature Granulocytes: 1 %
Lymphocytes Relative: 17 %
Lymphs Abs: 1.9 10*3/uL (ref 0.7–4.0)
MCH: 31.8 pg (ref 26.0–34.0)
MCHC: 32 g/dL (ref 30.0–36.0)
MCV: 99.5 fL (ref 80.0–100.0)
Monocytes Absolute: 0.7 10*3/uL (ref 0.1–1.0)
Monocytes Relative: 6 %
Neutro Abs: 8.4 10*3/uL — ABNORMAL HIGH (ref 1.7–7.7)
Neutrophils Relative %: 75 %
Platelets: 206 10*3/uL (ref 150–400)
RBC: 3.65 MIL/uL — ABNORMAL LOW (ref 4.22–5.81)
RDW: 14.6 % (ref 11.5–15.5)
WBC: 11.2 10*3/uL — ABNORMAL HIGH (ref 4.0–10.5)
nRBC: 0 % (ref 0.0–0.2)

## 2023-09-12 LAB — HEMOGLOBIN A1C
Hgb A1c MFr Bld: 5.6 % (ref 4.8–5.6)
Mean Plasma Glucose: 114.02 mg/dL

## 2023-09-12 LAB — TSH: TSH: 5.239 u[IU]/mL — ABNORMAL HIGH (ref 0.350–4.500)

## 2023-09-12 LAB — SURGICAL PCR SCREEN
MRSA, PCR: NEGATIVE
Staphylococcus aureus: NEGATIVE

## 2023-09-12 LAB — VITAMIN B12: Vitamin B-12: 207 pg/mL (ref 180–914)

## 2023-09-12 LAB — SEDIMENTATION RATE: Sed Rate: 75 mm/h — ABNORMAL HIGH (ref 0–16)

## 2023-09-12 LAB — HIV ANTIBODY (ROUTINE TESTING W REFLEX): HIV Screen 4th Generation wRfx: NONREACTIVE

## 2023-09-12 LAB — FOLATE: Folate: 10.7 ng/mL (ref >5.9)

## 2023-09-12 LAB — PREALBUMIN: Prealbumin: 7 mg/dL — ABNORMAL LOW (ref 18–38)

## 2023-09-12 LAB — MAGNESIUM: Magnesium: 2.3 mg/dL (ref 1.7–2.4)

## 2023-09-12 MED ORDER — ONDANSETRON HCL 4 MG PO TABS: 4.0000 mg | ORAL_TABLET | Freq: Four times a day (QID) | ORAL | Status: DC | PRN

## 2023-09-12 MED ORDER — ROSUVASTATIN CALCIUM 20 MG PO TABS
20.0000 mg | ORAL_TABLET | Freq: Every day | ORAL | Status: DC
  Administered 2023-09-13 – 2023-10-07 (×25): 20 mg via ORAL
  Filled 2023-09-12 (×26): qty 1

## 2023-09-12 MED ORDER — ACETAMINOPHEN 325 MG PO TABS
650.0000 mg | ORAL_TABLET | Freq: Four times a day (QID) | ORAL | Status: DC | PRN
  Administered 2023-09-12 – 2023-10-07 (×7): 650 mg via ORAL
  Filled 2023-09-12 (×10): qty 2

## 2023-09-12 MED ORDER — SODIUM CHLORIDE 0.9 % IV SOLN
2.0000 g | INTRAVENOUS | Status: DC
  Administered 2023-09-12: 2 g via INTRAVENOUS
  Filled 2023-09-12: qty 20

## 2023-09-12 MED ORDER — VANCOMYCIN HCL 2000 MG/400ML IV SOLN
2000.0000 mg | Freq: Once | INTRAVENOUS | Status: DC
  Filled 2023-09-12: qty 400

## 2023-09-12 MED ORDER — VANCOMYCIN HCL 1.5 G IV SOLR
1500.0000 mg | INTRAVENOUS | Status: DC
  Administered 2023-09-12: 1500 mg via INTRAVENOUS
  Filled 2023-09-12 (×2): qty 30

## 2023-09-12 MED ORDER — ONDANSETRON HCL 4 MG/2ML IJ SOLN: 4.0000 mg | Freq: Four times a day (QID) | INTRAMUSCULAR | Status: DC | PRN

## 2023-09-12 MED ORDER — CYANOCOBALAMIN 1000 MCG/ML IJ SOLN
1000.0000 ug | Freq: Every day | INTRAMUSCULAR | Status: AC
  Administered 2023-09-12 – 2023-09-18 (×7): 1000 ug via SUBCUTANEOUS
  Filled 2023-09-12 (×7): qty 1

## 2023-09-12 MED ORDER — METRONIDAZOLE 500 MG/100ML IV SOLN
500.0000 mg | Freq: Two times a day (BID) | INTRAVENOUS | Status: DC
  Administered 2023-09-12 (×2): 500 mg via INTRAVENOUS
  Filled 2023-09-12 (×2): qty 100

## 2023-09-12 MED ORDER — ENOXAPARIN SODIUM 40 MG/0.4ML IJ SOSY
40.0000 mg | PREFILLED_SYRINGE | INTRAMUSCULAR | Status: DC
  Administered 2023-09-12 – 2023-10-07 (×26): 40 mg via SUBCUTANEOUS
  Filled 2023-09-12 (×26): qty 0.4

## 2023-09-12 MED ORDER — ACETAMINOPHEN 650 MG RE SUPP: 650.0000 mg | Freq: Four times a day (QID) | RECTAL | Status: DC | PRN

## 2023-09-12 MED ORDER — MUPIROCIN 2 % EX OINT
1.0000 | TOPICAL_OINTMENT | Freq: Two times a day (BID) | CUTANEOUS | Status: AC
  Administered 2023-09-12 – 2023-09-16 (×10): 1 via NASAL
  Filled 2023-09-12: qty 22

## 2023-09-12 NOTE — Progress Notes (Signed)
VASCULAR LAB    ABI has been performed.  See CV proc for preliminary results.   Drina Jobst, RVT 09/12/2023, 2:32 PM

## 2023-09-12 NOTE — Consult Note (Addendum)
Hospital Consult    Reason for Consult:  BLE wounds Requesting Physician:  Julian Reil MRN #:  [id number]  History of Present Illness: This is a 78 y.o. male who presented to the hospital with BLE wounds.    I am unable to obtain any information from the pt. He will wake up and look at me and then fall right back to sleep.  I discussed with RN she states charge RN and MD has been called to evaluate pt.  Per chart review, pt has BLE wounds.  He has a left heel wound.  He was admitted to [facility] with BLE infections associated with not removing his socks for a long period of time.   He underwent debridement and his wound cx grew out MRSA from at least one wound and was sensitive to Doxycycline.  ABI's were obtained and decreased.  He was sent to [facility] for decreased ABI and Vascular Surgery evaluation.    He does wake up but provides little information.  Says he has had the wounds for about 6 months.  Says he doesn't walk but when asked he says he walks with a walker.  Says his abdomen hurts with Dr. Sharon Seller presses on his belly.   The pt is on a statin for cholesterol management.  The pt is on a daily aspirin.   Other AC:  none The pt is on ACEI, CCB for hypertension.   The pt is not on medication for diabetes PTA. Tobacco hx:  current  Past Medical History:  Diagnosis Date   Bradycardia    Coronary atherosclerosis of native coronary artery    Multivessel status post CABG   History of kidney stones    Hyperlipidemia    Hypertension    Tobacco user     Past Surgical History:  Procedure Laterality Date   APPENDECTOMY     age 7   CHOLECYSTECTOMY  2003   COLONOSCOPY N/A 04/21/2018   Procedure: COLONOSCOPY;  Surgeon: Malissa Hippo, MD;  [facility];  Service: Endoscopy;  Laterality: N/A;  12:00-rescheduled to 8/28 @ 10:30am per Dewayne Hatch   CORONARY ARTERY BYPASS GRAFT  2008   LIMA to LAD, SVG to ramus, SVG to PDA and PLA   EXTRACORPOREAL SHOCK WAVE LITHOTRIPSY Left  12/02/2018   Procedure: EXTRACORPOREAL SHOCK WAVE LITHOTRIPSY (ESWL);  Surgeon: Crista Elliot, MD;  Location: [facility];  Service: Urology;  Laterality: Left;   FOOT SURGERY     KNEE ARTHROPLASTY     POLYPECTOMY  04/21/2018   Procedure: POLYPECTOMY;  Surgeon: Malissa Hippo, MD;  Location: [facility];  Service: Endoscopy;;   ROTATOR CUFF REPAIR     SHOULDER SURGERY      No Known Allergies  Prior to Admission medications   Medication Sig Start Date End Date Taking? Authorizing Provider  amLODipine (NORVASC) 5 MG tablet Take 5 mg by mouth every other day.     [provider]  aspirin EC 81 MG tablet Take 1 tablet (81 mg total) by mouth daily. 04/28/18   Rehman, Joline Maxcy, MD  benazepril (LOTENSIN) 5 MG tablet Take 5 mg by mouth daily.  02/14/19   [provider]  Cholecalciferol (VITAMIN D3) 1000 units CAPS Take 1,000 Units by mouth daily.    [provider]  citalopram (CELEXA) 40 MG tablet Take 1 tablet (40 mg total) by mouth daily. 10/21/17   Aliene Beams, MD  furosemide (LASIX) 20 MG tablet Take 20 mg by mouth daily.  02/14/19   [provider]  nitroGLYCERIN (NITROSTAT) 0.4 MG SL tablet Place 1 tablet (0.4 mg total) under the tongue every 5 (five) minutes x 3 doses as needed for chest pain (if no relief after 3rd dose, proceed to the ED for an evaluation or call 911). 01/02/20   Jonelle Sidle, MD  polyethylene glycol [facility] / Ethelene Hal) packet Take 17 g by mouth daily.     [provider]  rosuvastatin (CRESTOR) 20 MG tablet Take 20 mg by mouth daily.    [provider]  traZODone (DESYREL) 50 MG tablet Take 50 mg by mouth at bedtime.  11/17/18   [provider]    Social History   Socioeconomic History   Marital status: Widowed    Spouse name: Not on file   Number of children: Not on file   Years of education: Not on file   Highest education level: Not on file  Occupational History   Occupation: retired   Tobacco Use   Smoking status: Every Day    Current packs/day: 1.00    Average packs/day: 1 pack/day for 64.3 years (64.3 ttl pk-yrs)    Types: Cigarettes    Start date: 06/03/1959   Smokeless tobacco: Never  Vaping Use   Vaping status: Never Used  Substance and Sexual Activity   Alcohol use: Yes    Alcohol/week: 0.0 standard drinks of alcohol   Drug use: No   Sexual activity: Not on file  Other Topics Concern   Not on file  Social History Narrative   Had been married for over 51 years and wife passed away in 09/23/2015 from cancer. Has children and grandchildren and great grandchildren. Gets together with family for dinner frequently. Lives alone and cooks for self. Live in [place]. 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 Eats all food groups.     Social Drivers of Corporate investment banker Strain: Low Risk  (09/04/2023)   Received from [facility]   Overall Financial Resource Strain (CARDIA)    Difficulty of Paying Living Expenses: Not hard at all  Food Insecurity: No Food Insecurity (09/11/2023)   Hunger Vital Sign    Worried About Running Out of Food in the Last Year: Never true    Ran Out of Food in the Last Year: Never true  Transportation Needs: No Transportation Needs (09/11/2023)   PRAPARE - Administrator, Civil Service (Medical): No    Lack of Transportation (Non-Medical): No  Physical Activity: Not on file  Stress: Not on file  Social Connections: Socially Isolated (09/11/2023)   Social Connection and Isolation Panel [NHANES]    Frequency of Communication with Friends and Family: More than three times a week    Frequency of Social Gatherings with Friends and Family: Twice a week    Attends Religious Services: Never    Database administrator or Organizations: No    Attends Banker Meetings: Patient unable to answer    Marital Status: Widowed  Intimate Partner Violence: Not At  Risk (09/11/2023)   Humiliation, Afraid, Rape, and Kick questionnaire    Fear of Current or Ex-Partner: No    Emotionally Abused: No    Physically Abused: No    Sexually Abused: No     Family History  Problem Relation Age of Onset   Heart attack Mother    Cerebral aneurysm Father  Coronary artery disease Neg Hx    Diabetes Neg Hx    Hypertension Neg Hx     ROS: [x]  Positive   [ ]  Negative   [ ]  All sytems reviewed and are negative  Cardiac: [x]  CAD with hx of CABG [x]  HTN [x]  HLD Vascular: []  pain in legs while walking []  pain in legs at rest []  pain in legs at night []  non-healing ulcers []  hx of DVT []  swelling in legs  Pulmonary: []  asthma/wheezing []  home O2  Neurologic: []  hx of CVA []  mini stroke   Hematologic: []  hx of cancer  Endocrine:   []  diabetes []  thyroid disease  GI [x]  hx kidney stones  GU: []  CKD/renal failure []  HD--[]  M/W/F or []  T/T/S  Psychiatric: []  anxiety []  depression  Musculoskeletal: []  arthritis []  joint pain  Integumentary: []  rashes []  ulcers  Constitutional: []  fever  []  chills  Physical Examination  Vitals:   09/12/23 0738 09/12/23 1007  BP: (!) 129/54 115/60  Pulse: (!) 49 (!) 42  Resp: 18 20  Temp: 97.7 F (36.5 C) 98.7 F (37.1 C)  SpO2: 98% 94%   Body mass index is 28.98 kg/m.  General:  WDWN in NAD Gait: Not observed HENT: WNL, normocephalic Pulmonary: normal non-labored breathing Cardiac: regular Abdomen:  soft, tender to palpation Skin: without rashes Vascular Exam/Pulses: Palpable radial and femoral pulses + PT doppler signals bilaterally Extremities: BLE heel wounds as pictured  Right leg    Right heel   Left heel  Musculoskeletal: no muscle wasting or atrophy  Psychiatric:  Flat affect   CBC    Component Value Date/Time   WBC 11.2 (H) 09/12/2023 0128   RBC 3.65 (L) 09/12/2023 0128   HGB 11.6 (L) 09/12/2023 0128   HCT 36.3 (L) 09/12/2023 0128   PLT 206 09/12/2023  0128   MCV 99.5 09/12/2023 0128   MCH 31.8 09/12/2023 0128   MCHC 32.0 09/12/2023 0128   RDW 14.6 09/12/2023 0128   LYMPHSABS 1.9 09/12/2023 0128   MONOABS 0.7 09/12/2023 0128   EOSABS 0.1 09/12/2023 0128   BASOSABS 0.1 09/12/2023 0128    BMET    Component Value Date/Time   NA 136 09/12/2023 0128   K 4.3 09/12/2023 0128   CL 99 09/12/2023 0128   CO2 28 09/12/2023 0128   GLUCOSE 114 (H) 09/12/2023 0128   BUN 26 (H) 09/12/2023 0128   CREATININE 1.23 09/12/2023 0128   CREATININE 1.04 08/07/2017 1012   CALCIUM 8.1 (L) 09/12/2023 0128   GFRNONAA >60 09/12/2023 0128   GFRAA >60 01/23/2018 0856    COAGS: Lab Results  Component Value Date   INR 1.06 01/23/2018     Non-Invasive Vascular Imaging:   ABI at OSH 09/07/2023 Right:  0.32 Left:  0.29  New ABI ordered today     ASSESSMENT/PLAN: This is a 78 y.o. male with BLE wounds and vascular surgery consulted for evaluation   -difficult situation with bilateral heel wounds and very difficult to get them to heal even with improved blood flow.  He does have + PT doppler flow bilaterally. Medicine working up his lethargy.  New ABI ordered and possibly could do angiogram but most likely will need bilateral above knee amputations.  Certainly would benefit from palliative consult to determine goals of care.   -Dr. Chestine Spore to evaluate pt and determine further plan   Doreatha Massed, PA-C [facility] [phone]  I have seen and evaluated the patient. I agree with  the PA note as documented above.  78 year old male presents as a transfer from [facility] for bilateral lower extremity wounds.  Patient states has had these wounds for about 6 months.  Reported failure to thrive after his wife passed.  Clinical presentation is suggestive of extensive bilateral heel wounds from someone that is nonambulatory and bedbound.  He says he does walk sometimes with a walker.  I discussed that we could pursue angiography to  evaluate for vascular disease given reported reduced ABIs.  I doubt this will really change his clinical picture as given the extent of these wounds and heal necrosis will be very difficult to heal and more than likely will be facing bilateral amputations.  Agree with goals of care and palliative care discussion given his overall failure to thrive state.  Vascular will follow.  Will get updated ABIs while he is here.   Shaune Pascal, MD [facility] [place] Office: [phone]

## 2023-09-12 NOTE — Assessment & Plan Note (Signed)
Cont home BP meds 

## 2023-09-12 NOTE — Plan of Care (Signed)
  Problem: Elimination: Goal: Will not experience complications related to bowel motility Outcome: Progressing   

## 2023-09-12 NOTE — Assessment & Plan Note (Signed)
Sounds like baseline creat is ~1.3-1.4 from OSH notes.

## 2023-09-12 NOTE — Progress Notes (Signed)
PT Cancellation Note  Patient Details Name: Jack [name] MRN: [id number] DOB: September 23, 1945   Cancelled Treatment:    Reason Eval/Treat Not Completed: Medical issues which prohibited therapy. Acute PT to hold until after vascular surgery has evaluated pt per Dr.McClung. Will re-attempt as appropriate.  Hilton Cork, PT, DPT Secure Chat Preferred  Rehab Office [phone]   Arturo Morton Brion Aliment 09/12/2023, 8:48 AM

## 2023-09-12 NOTE — Assessment & Plan Note (Signed)
Home med rec pending. Plan to continue statin

## 2023-09-12 NOTE — Assessment & Plan Note (Addendum)
Pt transferred from [facility] for vascular surgery consultation / intervention on presumed PAD with abnormal ABI. Call Dr. Chestine Spore in AM to let him know pt arrived Keeping pt NPO for the moment and delay DVT ppx to 1600 until we know Dr. Ophelia Charter planned timing of procedure (angiography seems a likely next step I suspect, and maybe percutaneous intervention if an amenable lesion is found). LE wound pathway ABx = Rocephin / flagyl / vancomycin At least 1 of the wound cultures at [facility] this admission grew out MRSA = reason for vancomycin use Gram stain however was polymicrobial, thus will also leave pt on rocephin / flagyl as well for the moment. Wound care consult

## 2023-09-12 NOTE — Assessment & Plan Note (Signed)
Wound care consult. ABx as above.

## 2023-09-12 NOTE — Assessment & Plan Note (Signed)
PT/OT SW consult Sounds like may need placement. May end up transferring back to [facility] for placement however as they reportedly agreed to accept pt back after completing all needed vascular procedures here at [facility].

## 2023-09-12 NOTE — H&P (Signed)
History and Physical    Patient: Jack [name] UJW:[id number] DOB: 12/09/1945 DOA: 09/11/2023 DOS: the patient was seen and examined on 09/12/2023 PCP: Toma Deiters, MD  Patient coming from: Outside Hospital  Chief Complaint: Foot ulcer with PAD  HPI: Jack [name] is a 78 y.o. male with medical history significant of HTN, CAD s/p CABG, HLD.  Pt not taking meds or taking care of himself since death of wife in October 14, 2015.  Pt admitted 1/9 to [facility] with BLE leg infections associated with not removing socks for an extremely long time apparently.  At [facility]: debridement performed on wounds and pt started on ABx.  Wound cultures grew MRSA from at least one of the wounds.  MRSA was sensitive to doxycycline.  Pt noted to have abnormal ABIs in the R leg, felt that due to underlying PAD his R foot would be unlikely to heal without intervention.  After [facility] hospitalist discussed with vasc surg Dr. Chestine Spore as well as hospitalist.  Pt put on wait list at [facility] 1/13, due to lack of bed availability here at [facility], transfer delayed until late in the evening of 1/17.   Review of Systems: As mentioned in the history of present illness. All other systems reviewed and are negative. Past Medical History:  Diagnosis Date   Bradycardia    Coronary atherosclerosis of native coronary artery    Multivessel status post CABG   History of kidney stones    Hyperlipidemia    Hypertension    Tobacco user    Past Surgical History:  Procedure Laterality Date   APPENDECTOMY     age 60   CHOLECYSTECTOMY  2001/10/13   COLONOSCOPY N/A 04/21/2018   Procedure: COLONOSCOPY;  Surgeon: Malissa Hippo, MD;  [facility];  Service: Endoscopy;  Laterality: N/A;  12:00-rescheduled to 8/28 @ 10:30am per Dewayne Hatch   CORONARY ARTERY BYPASS GRAFT  October 13, 2006   LIMA to LAD, SVG to ramus, SVG to PDA and PLA   EXTRACORPOREAL SHOCK WAVE LITHOTRIPSY Left 12/02/2018   Procedure: EXTRACORPOREAL SHOCK WAVE LITHOTRIPSY (ESWL);  Surgeon: Crista Elliot, MD;  Location: [facility];  Service: Urology;  Laterality: Left;   FOOT SURGERY     KNEE ARTHROPLASTY     POLYPECTOMY  04/21/2018   Procedure: POLYPECTOMY;  Surgeon: Malissa Hippo, MD;  [facility];  Service: Endoscopy;;   ROTATOR CUFF REPAIR     SHOULDER SURGERY     Social History:  reports that he has been smoking cigarettes. He started smoking about 64 years ago. He has a 64.3 pack-year smoking history. He has never used smokeless tobacco. He reports current alcohol use. He reports that he does not use drugs.  No Known Allergies  Family History  Problem Relation Age of Onset   Heart attack Mother    Cerebral aneurysm Father    Coronary artery disease Neg Hx    Diabetes Neg Hx    Hypertension Neg Hx     Prior to Admission medications   Medication Sig Start Date End Date Taking? Authorizing Provider  amLODipine (NORVASC) 5 MG tablet Take 5 mg by mouth every other day.     [provider]  aspirin EC 81 MG tablet Take 1 tablet (81 mg total) by mouth daily. 04/28/18   Rehman, Joline Maxcy, MD  benazepril (LOTENSIN) 5 MG tablet Take 5 mg by mouth daily.  02/14/19   [provider]  Cholecalciferol (VITAMIN D3) 1000 units CAPS Take  1,000 Units by mouth daily.    [provider]  citalopram (CELEXA) 40 MG tablet Take 1 tablet (40 mg total) by mouth daily. 10/21/17   Aliene Beams, MD  furosemide (LASIX) 20 MG tablet Take 20 mg by mouth daily.  02/14/19   [provider]  nitroGLYCERIN (NITROSTAT) 0.4 MG SL tablet Place 1 tablet (0.4 mg total) under the tongue every 5 (five) minutes x 3 doses as needed for chest pain (if no relief after 3rd dose, proceed to the ED for an evaluation or call 911). 01/02/20   Jonelle Sidle, MD  polyethylene glycol [facility] / Ethelene Hal) packet Take 17 g by mouth daily.     [provider]  rosuvastatin (CRESTOR) 20 MG tablet Take 20 mg by mouth daily.    [provider]  traZODone (DESYREL) 50  MG tablet Take 50 mg by mouth at bedtime.  11/17/18   [provider]    Physical Exam: Vitals:   09/11/23 2356 09/12/23 0042 09/12/23 0044  BP: (!) 142/61 (!) 142/61 (!) 142/61  Pulse: 76 76 76  Resp: 18 18 18   Temp: 97.7 F (36.5 C) 97.7 F (36.5 C) 97.7 F (36.5 C)  TempSrc: Oral Oral Oral  SpO2: 100%    Weight:   91.6 kg  Height:   5\' 10"  (1.778 m)   Constitutional: NAD, calm, comfortable, elderly Respiratory: clear to auscultation bilaterally, no wheezing, no crackles. Normal respiratory effort. No accessory muscle use.  Cardiovascular: Regular rate and rhythm, no murmurs / rubs / gallops. No extremity edema. 2+ pedal pulses. No carotid bruits.  Abdomen: no tenderness, no masses palpated. No hepatosplenomegaly. Bowel sounds positive.  Skin: B ankles and feet under dressings, dressings C/D/I, no erythema beyond dressings. Neurologic: CN 2-12 grossly intact. Sensation intact, DTR normal. Strength 5/5 in all 4.  Psychiatric: Normal judgment and insight. Alert and oriented x 3. Normal mood.   Data Reviewed:  Results are pending, will review when available.  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 Call Dr. Chestine Spore in AM to let him know pt arrived Keeping pt NPO for the moment and delay DVT ppx to 1600 until we know Dr. Ophelia Charter planned timing of procedure (angiography seems a likely next step I suspect, and maybe percutaneous intervention if an amenable lesion is found). LE wound pathway ABx = Rocephin / flagyl / vancomycin At least 1 of the wound cultures at [facility] this admission grew out MRSA = reason for vancomycin use Gram stain however was polymicrobial, thus will also leave pt on rocephin / flagyl as well for the moment. Wound care consult  Ulcers of both lower legs (HCC) Wound care consult. ABx as above.  Self  neglect PT/OT SW consult Sounds like may need placement. May end up transferring back to [facility] for placement however as they reportedly agreed to accept pt back after completing all needed vascular procedures here at [facility].  CKD stage 3a, GFR 45-59 ml/min (HCC) Sounds like baseline creat is ~1.3-1.4 from OSH notes.  Essential hypertension, benign Cont home BP meds once med rec is completed.  Hyperlipidemia LDL goal <70 Home med rec pending. Plan to continue statin      Advance Care Planning:   Code Status: Full Code  Consults: Call Dr. Chestine Spore in AM  Family Communication: No family in room  Severity of Illness: The appropriate  patient status for this patient is INPATIENT. Inpatient status is judged to be reasonable and necessary in order to provide the required intensity of service to ensure the patient's safety. The patient's presenting symptoms, physical exam findings, and initial radiographic and laboratory data in the context of their chronic comorbidities is felt to place them at high risk for further clinical deterioration. Furthermore, it is not anticipated that the patient will be medically stable for discharge from the hospital within 2 midnights of admission.   * I certify that at the point of admission it is my clinical judgment that the patient will require inpatient hospital care spanning beyond 2 midnights from the point of admission due to high intensity of service, high risk for further deterioration and high frequency of surveillance required.*  Author: Hillary Bow., DO 09/12/2023 1:20 AM  For on call review [email or web address].

## 2023-09-12 NOTE — Progress Notes (Signed)
Pharmacy Antibiotic Note  Jack [name] is a 78 y.o. male admitted on 09/11/2023 with wound infection from [facility]. Admitted 1/9 for BLE infection s/p debridement on wounds with Wound Cx showing MRSA (sensitive to doxycycline).   Patient was transitioned to doxycyline on 1/6 after being on vancomycin 1250mg  IV q24h (dose decreased after recent Vanc trough was 20.7 on 1/14).  Patient transferred given for vascular surgery consult with presumed PAD and abnormal ABI. Pharmacy has been consulted for vancomycin dosing.  -1/17 sCr 1.25 (trending down from 1.5)  Plan: -Ceftriaxone 2g IV every 24 hours per MD -Flagyl 500mg  IV every 12 hours per MD -Vancomycin 1500mg  IV every 24 hours (AUC 485, Vd 0.72, IBW)-given improvement in kidney function and at goal AUC -Monitor renal function -Follow up signs of clinical improvement, LOT, de-escalation of antibiotics   Height: 5\' 10"  (177.8 cm) Weight: 91.6 kg (201 lb 15.1 oz) IBW/kg (Calculated) : 73  Temp (24hrs), Avg:97.7 F (36.5 C), Min:97.7 F (36.5 C), Max:97.7 F (36.5 C)   No Known Allergies  Antimicrobials this admission: Ceftriaxone 1/18 >>  Flagyl 1/18 >>  Vancomycin 1/18 >>  Microbiology results: 1/10 BCx: MRSA  Thank you for allowing pharmacy to be a part of this patient's care.  Arabella Merles, PharmD. Clinical Pharmacist 09/12/2023 2:42 AM

## 2023-09-12 NOTE — Progress Notes (Signed)
OT Cancellation Note  Patient Details Name: AVROHOM MCELVEEN MRN: [id number] DOB: 1946/01/05   Cancelled Treatment:    Reason Eval/Treat Not Completed: Medical issues which prohibited therapy (Discussed with MD. Sharon Seller, OT to hold therapy until he is able to see pt. OT will follow-up with pt as able)  09/12/2023  AB, OTR/L  Acute Rehabilitation Services  Office: [phone]   Tristan Schroeder 09/12/2023, 1:20 PM

## 2023-09-12 NOTE — Progress Notes (Signed)
Jack [name]  VQQ:[id number] DOB: [birth year] DOA: 09/11/2023 PCP: Toma Deiters, MD    Brief Narrative:  78 year old with a history of HTN, CAD status post CABG, and HLD who admits to not taking his usual medications or caring for himself appropriately since the death of his wife in 09/26/15.  He was admitted to [facility] 09/03/2023 for evaluation of what was felt to be bilateral lower extremity cellulitis with ulcerations.  He underwent I&D at that facility and was treated with empiric antibiotics.  Wound cultures grew MRSA sensitive to doxycycline.  ABIs of the right leg were noted to be significantly diminished and it was felt the patient would likely require vascular intervention for wound healing.  Attempts were made to transfer him starting 1/13 but due to limited bed availability he did not arrive at [facility] until late night 09/11/2023.  Goals of Care:   Code Status: Full Code   DVT prophylaxis: enoxaparin (LOVENOX) injection 40 mg Start: 09/12/23 1600  Interim Hx: Afebrile.  Vital signs stable.  No acute events recorded since admission.  The patient was examined and interviewed by one of my partners earlier today.  Assessment & Plan:  Ischemic v/s pressure ulcerations B heels - PAD Abnormal ABI at [facility] reportedly discussed with [facility] Vascular Surgery with transfer to [facility] for further investigation and potential intervention -continue broad antibiotic coverage given polymicrobial Gram stain as well as MRSA positive wound culture - Vasc Surgery consulted - ABIs obtained at outside hospital 09/07/23 as follows:  Right ABI: 0.32 Left ABI: 0.29 Right Lower Extremity: Abnormal diminished monophasic arterial waveforms. Left Lower Extremity: Abnormal diminished arterial waveforms. < 0.5 Severe PAD   Bilateral lower extremity ulcerations and cellulitis Continue local wound care   Acute delirium Mental status altered for unclear reasons - investigation initiated    Self-neglect PT/OT and social work consults -if prolonged placement becomes an issue patient is to be transferred back to [facility] after his vascular surgery care is completed  CKD stage IIIa Baseline creatinine appears to be approximately 1.4  Normocytic anemia Likely anemia of chronic kidney disease - obtain Fe studies   HTN BP well controlled   HLD Continue usual statin   Family Communication: No family present at time of exam Disposition:     Objective: Blood pressure (!) 129/54, pulse (!) 49, temperature 97.7 F (36.5 C), temperature source Oral, resp. rate 18, height 5\' 10"  (1.778 m), weight 91.6 kg, SpO2 98%.  Intake/Output Summary (Last 24 hours) at 09/12/2023 0913 Last data filed at 09/12/2023 0300 Gross per 24 hour  Intake 200 ml  Output --  Net 200 ml   Filed Weights   09/12/23 0044  Weight: 91.6 kg    Examination: General: No acute respiratory distress -lethargic requiring frequent tactile stimulation to maintain alertness Lungs: Clear to auscultation bilaterally without wheezes or crackles Cardiovascular: Regular rate and rhythm without murmur  Abdomen: Nontender, nondistended, soft, bowel sounds positive, no rebound, no ascites, no appreciable mass Extremities: No significant edema bilateral lower extremities -multiple superficial cutaneous wounds bilateral lower extremities from knees down to ankles with bilateral deep heel ulcerations suggestive of pressure injury without purulent discharge or significant erythema of the extremities  CBC: Recent Labs  Lab 09/12/23 0128  WBC 11.2*  NEUTROABS 8.4*  HGB 11.6*  HCT 36.3*  MCV 99.5  PLT 206   Basic Metabolic Panel: Recent Labs  Lab 09/12/23 0128  NA 136  K 4.3  CL 99  CO2 28  GLUCOSE 114*  BUN 26*  CREATININE 1.23  CALCIUM 8.1*  MG 2.3  PHOS 2.9   GFR: Estimated Creatinine Clearance: 57.2 mL/min (by C-G formula based on SCr of 1.23 mg/dL).   Scheduled Meds:  enoxaparin (LOVENOX)  injection  40 mg Subcutaneous Q24H   mupirocin ointment  1 Application Nasal BID   Continuous Infusions:  cefTRIAXone (ROCEPHIN)  IV 2 g (09/12/23 0353)   metronidazole 500 mg (09/12/23 0245)   vancomycin 1,500 mg (09/12/23 0455)     LOS: 1 day   Lonia Blood, MD [facility] Office  [phone] Pager - Text Page per Loretha Stapler  If 7PM-7AM, please contact night-coverage per Amion 09/12/2023, 9:13 AM

## 2023-09-13 DIAGNOSIS — Z515 Encounter for palliative care: Secondary | ICD-10-CM | POA: Diagnosis not present

## 2023-09-13 DIAGNOSIS — Z7189 Other specified counseling: Secondary | ICD-10-CM | POA: Diagnosis not present

## 2023-09-13 DIAGNOSIS — I70235 Atherosclerosis of native arteries of right leg with ulceration of other part of foot: Secondary | ICD-10-CM | POA: Diagnosis not present

## 2023-09-13 DIAGNOSIS — L97519 Non-pressure chronic ulcer of other part of right foot with unspecified severity: Secondary | ICD-10-CM | POA: Diagnosis not present

## 2023-09-13 LAB — MAGNESIUM: Magnesium: 2.3 mg/dL (ref 1.7–2.4)

## 2023-09-13 LAB — CBC
HCT: 38 % — ABNORMAL LOW (ref 39.0–52.0)
Hemoglobin: 12.3 g/dL — ABNORMAL LOW (ref 13.0–17.0)
MCH: 31.6 pg (ref 26.0–34.0)
MCHC: 32.4 g/dL (ref 30.0–36.0)
MCV: 97.7 fL (ref 80.0–100.0)
Platelets: 194 10*3/uL (ref 150–400)
RBC: 3.89 MIL/uL — ABNORMAL LOW (ref 4.22–5.81)
RDW: 15 % (ref 11.5–15.5)
WBC: 10.5 10*3/uL (ref 4.0–10.5)
nRBC: 0 % (ref 0.0–0.2)

## 2023-09-13 LAB — COMPREHENSIVE METABOLIC PANEL
ALT: 52 U/L — ABNORMAL HIGH (ref 0–44)
AST: 51 U/L — ABNORMAL HIGH (ref 15–41)
Albumin: 2.2 g/dL — ABNORMAL LOW (ref 3.5–5.0)
Alkaline Phosphatase: 86 U/L (ref 38–126)
Anion gap: 12 (ref 5–15)
BUN: 24 mg/dL — ABNORMAL HIGH (ref 8–23)
CO2: 28 mmol/L (ref 22–32)
Calcium: 8.5 mg/dL — ABNORMAL LOW (ref 8.9–10.3)
Chloride: 98 mmol/L (ref 98–111)
Creatinine, Ser: 1.13 mg/dL (ref 0.61–1.24)
GFR, Estimated: >60 mL/min (ref >60)
Glucose, Bld: 101 mg/dL — ABNORMAL HIGH (ref 70–99)
Potassium: 4.4 mmol/L (ref 3.5–5.1)
Sodium: 138 mmol/L (ref 135–145)
Total Bilirubin: 0.8 mg/dL (ref 0.0–1.2)
Total Protein: 6.2 g/dL — ABNORMAL LOW (ref 6.5–8.1)

## 2023-09-13 LAB — LIPID PANEL
Cholesterol: 99 mg/dL (ref 0–200)
HDL: 20 mg/dL — ABNORMAL LOW (ref >40)
LDL Cholesterol: 63 mg/dL (ref 0–99)
Total CHOL/HDL Ratio: 5 {ratio}
Triglycerides: 79 mg/dL (ref <150)
VLDL: 16 mg/dL (ref 0–40)

## 2023-09-13 LAB — RPR: RPR Ser Ql: NONREACTIVE

## 2023-09-13 LAB — IRON AND TIBC
Iron: 43 ug/dL — ABNORMAL LOW (ref 45–182)
Saturation Ratios: 24 % (ref 17.9–39.5)
TIBC: 176 ug/dL — ABNORMAL LOW (ref 250–450)
UIBC: 133 ug/dL

## 2023-09-13 LAB — RETICULOCYTES
Immature Retic Fract: 13.2 % (ref 2.3–15.9)
RBC.: 3.83 MIL/uL — ABNORMAL LOW (ref 4.22–5.81)
Retic Count, Absolute: 80.4 10*3/uL (ref 19.0–186.0)
Retic Ct Pct: 2.1 % (ref 0.4–3.1)

## 2023-09-13 LAB — T4, FREE: Free T4: 1.14 ng/dL — ABNORMAL HIGH (ref 0.61–1.12)

## 2023-09-13 LAB — FERRITIN: Ferritin: 778 ng/mL — ABNORMAL HIGH (ref 24–336)

## 2023-09-13 NOTE — Plan of Care (Signed)
  Problem: Education: Goal: Knowledge of General Education information will improve Description Including pain rating scale, medication(s)/side effects and non-pharmacologic comfort measures Outcome: Progressing   

## 2023-09-13 NOTE — Consult Note (Signed)
WOC Nurse Consult Note: Reason for Consult: LE wounds  Evaluated by VVS; ABIs indicate severe arterial disease. Unclear if they will perform any type of re-vascularization Wound type: RLE; 3 ;necrotic; 100% black wounds; one open, pink R heel: 50% necrotic/50% pale Left achilles; 100% black eschar Left heel stable 100% black eschar Pressure Injury POA: Yes Measurement: see nursing notes Wound bed: see above  Drainage (amount, consistency, odor) see nursing flow sheet Periwound: intact  Dressing procedure/placement/frequency: Paint all wounds with betadine that are necrotic, cover with foam Offload heels with Prevalon boots bilaterally Xeroform to the open wound, right LE, top with foam, change daily    Re consult if needed, will not follow at this time. Thanks  Solyana Nonaka [employer], RN,CWOCN, CNS, CWON-AP [phone])

## 2023-09-13 NOTE — Evaluation (Signed)
Clinical/Bedside Swallow Evaluation Patient Details  Name: Jack [name] MRN: [id number] Date of Birth: 1945-09-04  Today's Date: 09/13/2023 Time: SLP Start Time (ACUTE ONLY): 1358 SLP Stop Time (ACUTE ONLY): 1414 SLP Time Calculation (min) (ACUTE ONLY): 16 min  Past Medical History:  Past Medical History:  Diagnosis Date   Bradycardia    Coronary atherosclerosis of native coronary artery    Multivessel status post CABG   History of kidney stones    Hyperlipidemia    Hypertension    Tobacco user    Past Surgical History:  Past Surgical History:  Procedure Laterality Date   APPENDECTOMY     age 82   CHOLECYSTECTOMY  2003   COLONOSCOPY N/A 04/21/2018   Procedure: COLONOSCOPY;  Surgeon: Malissa Hippo, MD;  Location: [facility];  Service: Endoscopy;  Laterality: N/A;  12:00-rescheduled to 8/28 @ 10:30am per Dewayne Hatch   CORONARY ARTERY BYPASS GRAFT  2008   LIMA to LAD, SVG to ramus, SVG to PDA and PLA   EXTRACORPOREAL SHOCK WAVE LITHOTRIPSY Left 12/02/2018   Procedure: EXTRACORPOREAL SHOCK WAVE LITHOTRIPSY (ESWL);  Surgeon: Crista Elliot, MD;  Location: [facility] ORS;  Service: Urology;  Laterality: Left;   FOOT SURGERY     KNEE ARTHROPLASTY     POLYPECTOMY  04/21/2018   Procedure: POLYPECTOMY;  Surgeon: Malissa Hippo, MD;  Location: [facility];  Service: Endoscopy;;   ROTATOR CUFF REPAIR     SHOULDER SURGERY     HPI:  Pt is a 78 year old who was transferred to [facility] on 1/17 for decreased ABI and vascular surgery evaluation. Pt initially admitted to [facility] 09/03/2023 for evaluation of what was felt to be bilateral lower extremity cellulitis with ulcerations. He underwent I&D at that facility and was treated with empiric antibiotics.  Wound cultures grew MRSA sensitive to doxycycline. ABIs of the right leg were noted to be significantly diminished and it was felt the patient would likely require vascular intervention for wound healing. PMH: HTN, CAD s/p CABG, HLD.     Assessment / Plan / Recommendation  Clinical Impression  Pt was seen for bedside swallow evaluation and he denied a history of dysphagia. Pt was alert, oriented to time and place, and cooperative during the evaluation, but pt's RN reported that his current mentation is not representative of his presentation throughout the shift. Pt did not participate in a complete oral mechanism exam, but oral mucosa was moist and he was edentulous. He tolerated all solids and liquids without signs or symptoms of oropharyngeal dysphagia with the exception of an inconsistent delayed cough with regular textures. Considering that the pt's current mentation is reportedly atypical, SLP will conservatively advance his diet to dysphagia 2 with thin liquids. Pt's diet may be advanced further to regular texture if he is able to maintain his current mentation. SLP will follow briefly. SLP Visit Diagnosis: Dysphagia, unspecified (R13.10)    Aspiration Risk  Mild aspiration risk    Diet Recommendation Dysphagia 2 (Fine chop);Thin liquid    Liquid Administration via: Cup;Straw Medication Administration: Whole meds with puree Supervision: Patient able to self feed Postural Changes: Seated upright at 90 degrees    Other  Recommendations Oral Care Recommendations: Oral care BID    Recommendations for follow up therapy are one component of a multi-disciplinary discharge planning process, led by the attending physician.  Recommendations may be updated based on patient status, additional functional criteria and insurance authorization.  Follow up Recommendations No SLP follow  up      Assistance Recommended at Discharge    Functional Status Assessment Patient has had a recent decline in their functional status and demonstrates the ability to make significant improvements in function in a reasonable and predictable amount of time.  Frequency and Duration min 1 x/week  1 week       Prognosis Prognosis for improved  oropharyngeal function: Good Barriers to Reach Goals: Cognitive deficits      Swallow Study   General Date of Onset: 09/12/23 HPI: Pt is a 77 year old who was transferred to [facility] on 1/17 for decreased ABI and vascular surgery evaluation. Pt initially admitted to [facility] 09/03/2023 for evaluation of what was felt to be bilateral lower extremity cellulitis with ulcerations. He underwent I&D at that facility and was treated with empiric antibiotics.  Wound cultures grew MRSA sensitive to doxycycline. ABIs of the right leg were noted to be significantly diminished and it was felt the patient would likely require vascular intervention for wound healing. PMH: HTN, CAD s/p CABG, HLD. Type of Study: Bedside Swallow Evaluation Previous Swallow Assessment: none Diet Prior to this Study: Clear liquid diet Temperature Spikes Noted: No Respiratory Status: Nasal cannula History of Recent Intubation: No Behavior/Cognition: Lethargic/Drowsy Oral Cavity Assessment: Within Functional Limits Oral Care Completed by SLP: No Oral Cavity - Dentition: Edentulous (does not wear dentures) Vision: Functional for self-feeding Self-Feeding Abilities: Needs assist Patient Positioning: Upright in bed Baseline Vocal Quality: Normal Volitional Swallow: Able to elicit    Oral/Motor/Sensory Function Overall Oral Motor/Sensory Function:  (difficult to assess)   Ice Chips Ice chips: Within functional limits Presentation: Spoon   Thin Liquid Thin Liquid: Within functional limits Presentation: Straw    Nectar Thick Nectar Thick Liquid: Not tested   Honey Thick Honey Thick Liquid: Not tested   Puree Puree: Within functional limits Presentation: Spoon   Solid     Solid: Impaired Presentation: Spoon Pharyngeal Phase Impairments: Cough - Delayed     Kynslie Ringle I. Vear Clock, MS, CCC-SLP Acute Rehabilitation Services Office number [phone]  Scheryl Marten 09/13/2023,2:17 PM

## 2023-09-13 NOTE — Evaluation (Signed)
Physical Therapy Evaluation Patient Details Name: Jack [name] MRN: [id number] DOB: [birth year] Today's Date: 09/13/2023  History of Present Illness  Jack [name] is a 78 y.o. male admitted with bil LE wounds, transferred to [facility] for Vascular assessment; anticipate difficulty with wound healing; considering options; Palliative Medicine consulted; with medical history significant of HTN, CAD s/p CABG, HLD  Clinical Impression   Pt admitted with above diagnosis. Lives at home alone, in a single-level home; Per chart review, pt has had difficulty managing his needs and caring for himself since his wife passed in 2017; Pt's son had been bringing meals to him; Given current status, it seems that prior to admission, pt was able to grossly meet toileting needs until somewhat recently; with Bil LE wounds related to "not taking his (pt's) socks off" points to significant difficulty with bathing/dressing; Pt is unclear about when the last time he stood and walked was; Presents to PT with bil LE wounds (as outlined in MD, and WOC notes), significant functional dependencies;  Needed 2 person Max/Total assist for rolling, and moving supine to sitting EOB and laying back down after sitting unsupported at EOB; Anticipate the need for post-acute rehab; will continue to follow; Pt currently with functional limitations due to the deficits listed below (see PT Problem List). Pt will benefit from skilled PT to increase their independence and safety with mobility to allow discharge to the venue listed below.           If plan is discharge home, recommend the following:     Can travel by private vehicle   No    Equipment Recommendations Other (comment) (will depend on progress)  Recommendations for Other Services       Functional Status Assessment Patient has had a recent decline in their functional status and/or demonstrates limited ability to make significant improvements in function in a reasonable and  predictable amount of time     Precautions / Restrictions Precautions Precautions: Fall Required Braces or Orthoses: Other Brace Other Brace: prevalon boots; float heels Restrictions Weight Bearing Restrictions Per Provider Order: No      Mobility  Bed Mobility Overal bed mobility: Needs Assistance Bed Mobility: Rolling, Supine to Sit, Sit to Supine Rolling: Max assist, +2 for physical assistance   Supine to sit: Max assist, +2 for physical assistance Sit to supine: Total assist, +2 for physical assistance   General bed mobility comments: Cues for technique; Needs handheld assist to shift shoulders adn trunk in teh bed in prep for rolling; 2 person assist to move to sitting EOB; Able to move LEs to EOB with light mod assist and near constant step-by-step cues; Max assist and use of bed pad to square off hips at EOB; Fatigued once it was time to lay back down, and pt needed 2 person total assist to help lay down and position; Rolled L and right once back in teh bed to help clean up    Transfers                        Ambulation/Gait                  Stairs            Wheelchair Mobility     Tilt Bed    Modified Rankin (Stroke Patients Only)       Balance Overall balance assessment: Needs assistance   Sitting balance-Leahy Scale: Fair (Approaching good) Sitting balance -  Comments: able to reach down to attend to skin just above ankle level sitting EOB                                     Pertinent Vitals/Pain Pain Assessment Pain Assessment: Faces Faces Pain Scale: Hurts even more Pain Location: Bil LEs with movement Pain Descriptors / Indicators: Grimacing, Guarding Pain Intervention(s): Monitored during session    Home Living Family/patient expects to be discharged to:: Private residence Living Arrangements: Alone Available Help at Discharge: Other (Comment) (unsure how much family can assist) Type of Home: House Home  Access: Stairs to enter       Home Layout: One level   Additional Comments: Unclear home situation and prior level of function; difficult to discern exactly how long ago teh last time pt got up and OOB was    Prior Function               Mobility Comments: Mostly bedbound? ADLs Comments: Reports his son brings over meals; unclear how pt managed toileting;     Extremity/Trunk Assessment   Upper Extremity Assessment Upper Extremity Assessment: Defer to OT evaluation    Lower Extremity Assessment Lower Extremity Assessment: Generalized weakness;RLE deficits/detail;LLE deficits/detail RLE Deficits / Details: RLE wounds; 3 ;necrotic; 100% black wounds; one open, pink LLE Deficits / Details: Achilles wound, heel wound; both covered in eschar       Communication   Communication Communication: No apparent difficulties  Cognition Arousal: Alert Behavior During Therapy: WFL for tasks assessed/performed Overall Cognitive Status: No family/caregiver present to determine baseline cognitive functioning                                 General Comments: Not a very clear historian        General Comments General comments (skin integrity, edema, etc.): Session conducted on supplemental O2; incontinent of bowel during session, but able to identify when he needed to void; Sacral dressing soiled, and assisted with placing a new one    Exercises     Assessment/Plan    PT Assessment Patient needs continued PT services  PT Problem List Decreased strength;Decreased range of motion;Decreased activity tolerance;Decreased balance;Decreased mobility;Decreased coordination;Decreased cognition;Decreased knowledge of use of DME;Decreased safety awareness;Decreased knowledge of precautions;Impaired sensation;Obesity;Decreased skin integrity;Pain       PT Treatment Interventions Functional mobility training;Therapeutic activities;Therapeutic exercise;Balance  training;Neuromuscular re-education;Cognitive remediation;Patient/family education;Wheelchair mobility training;Manual techniques;Modalities;DME instruction    PT Goals (Current goals can be found in the Care Plan section)  Acute Rehab PT Goals Patient Stated Goal: Did not specifically state; agrees to get up to EOB PT Goal Formulation: Patient unable to participate in goal setting Time For Goal Achievement: 09/27/23 Potential to Achieve Goals: Good    Frequency Min 1X/week     Co-evaluation PT/OT/SLP Co-Evaluation/Treatment: Yes Reason for Co-Treatment: Complexity of the patient's impairments (multi-system involvement);Necessary to address cognition/behavior during functional activity;For patient/therapist safety;To address functional/ADL transfers PT goals addressed during session: Mobility/safety with mobility         AM-PAC PT "6 Clicks" Mobility  Outcome Measure Help needed turning from your back to your side while in a flat bed without using bedrails?: Total Help needed moving from lying on your back to sitting on the side of a flat bed without using bedrails?: Total Help needed moving to and from a bed to a  chair (including a wheelchair)?: Total Help needed standing up from a chair using your arms (e.g., wheelchair or bedside chair)?: Total Help needed to walk in hospital room?: Total Help needed climbing 3-5 steps with a railing? : Total 6 Click Score: 6    End of Session Equipment Utilized During Treatment: Oxygen;Other (comment) (bed pads) Activity Tolerance: Patient tolerated treatment well;Other (comment) (despite pain and longhty amount of time in R sidelying for hygeine) Patient left: in bed;with call bell/phone within reach;with bed alarm set (bed in semi-chair position) Nurse Communication: Mobility status;Other (comment) (need for another primo fit) PT Visit Diagnosis: Other abnormalities of gait and mobility (R26.89);Muscle weakness (generalized)  (M62.81);Difficulty in walking, not elsewhere classified (R26.2);Adult, failure to thrive (R62.7)    Time: 4098-1191 PT Time Calculation (min) (ACUTE ONLY): 40 min   Charges:   PT Evaluation $PT Eval Moderate Complexity: 1 Mod   PT General Charges $$ ACUTE PT VISIT: 1 Visit         Van Clines, PT  Acute Rehabilitation Services Office [phone] Secure Chat welcomed   Levi Aland 09/13/2023, 5:32 PM

## 2023-09-13 NOTE — Evaluation (Signed)
Occupational Therapy Evaluation Patient Details Name: Jack [name] MRN: [id number] DOB: [birth year] Today's Date: 09/13/2023   History of Present Illness Jack [name] is a 78 y.o. male admitted with bil LE wounds, transferred to [facility] for Vascular assessment; anticipate difficulty with wound healing; considering options; Palliative Medicine consulted; with medical history significant of HTN, CAD s/p CABG, HLD   Clinical Impression   PTA, pt lived at home alone and son brought him meals. Per his report he has not stood in nearly 4 months but unsure accuracy of reports. Pt with BLE wounds apparently had not recently removed his socks. Pt also with bottom wound but looks more new. Pt needing 2 person max-total A for bed mobility this session and able to static sit with supervision-CGA at EOB. Will continue to follow acutely. Patient will benefit from continued inpatient follow up therapy, <3 hours/day       If plan is discharge home, recommend the following: Two people to help with walking and/or transfers;Two people to help with bathing/dressing/bathroom;Assistance with cooking/housework;Assist for transportation;Help with stairs or ramp for entrance;Direct supervision/assist for financial management;Direct supervision/assist for medications management    Functional Status Assessment  Patient has had a recent decline in their functional status and/or demonstrates limited ability to make significant improvements in function in a reasonable and predictable amount of time  Equipment Recommendations  Other (comment) (defer)    Recommendations for Other Services       Precautions / Restrictions Precautions Precautions: Fall Required Braces or Orthoses: Other Brace Other Brace: prevalon boots; float heels Restrictions Weight Bearing Restrictions Per Provider Order: No      Mobility Bed Mobility Overal bed mobility: Needs Assistance Bed Mobility: Rolling, Supine to Sit, Sit to  Supine Rolling: Max assist, +2 for physical assistance   Supine to sit: Max assist, +2 for physical assistance Sit to supine: Total assist, +2 for physical assistance   General bed mobility comments: Cues for technique; Needs handheld assist to shift shoulders and trunk in the bed in prep for rolling; 2 person assist to move to sitting EOB; Able to move LEs to EOB with light mod assist and near constant step-by-step cues; Max assist and use of bed pad to square off hips at EOB; Fatigued once it was time to lay back down, and pt needed 2 person total assist to help lay down and position; Rolled L and right once back in teh bed to help clean up    Transfers                   General transfer comment: deferred      Balance Overall balance assessment: Needs assistance   Sitting balance-Leahy Scale: Fair (Approaching good) Sitting balance - Comments: able to reach down to attend to skin just above ankle level sitting EOB                                   ADL either performed or assessed with clinical judgement   ADL Overall ADL's : Needs assistance/impaired Eating/Feeding: Set up;Bed level   Grooming: Set up;Bed level   Upper Body Bathing: Contact guard assist;Minimal assistance;Sitting   Lower Body Bathing: Total assistance;+2 for physical assistance;+2 for safety/equipment;Bed level   Upper Body Dressing : Minimal assistance;Sitting   Lower Body Dressing: Total assistance;+2 for physical assistance;+2 for safety/equipment;Bed level  Vision   Additional Comments: Pt did not report     Perception Perception: Not tested       Praxis Praxis: Not tested       Pertinent Vitals/Pain Pain Assessment Faces Pain Scale: Hurts even more Pain Location: Bil LEs with movement Pain Descriptors / Indicators: Grimacing, Guarding Pain Intervention(s): Monitored during session     Extremity/Trunk Assessment Upper Extremity  Assessment Upper Extremity Assessment: Generalized weakness   Lower Extremity Assessment Lower Extremity Assessment: Defer to PT evaluation RLE Deficits / Details: RLE wounds; 3 ;necrotic; 100% black wounds; one open, pink LLE Deficits / Details: Achilles wound, heel wound; both covered in eschar   Cervical / Trunk Assessment Cervical / Trunk Assessment: Kyphotic (forward rounding of shoulders)   Communication Communication Communication: No apparent difficulties   Cognition Arousal: Alert Behavior During Therapy: WFL for tasks assessed/performed Overall Cognitive Status: No family/caregiver present to determine baseline cognitive functioning                                 General Comments: Not a very clear historian, following one step commands with increased time and repeated commands. Hard of hearing.     General Comments  Session conducted on supplemental O2; incontinent of bowel during session, but able to identify when he needed to void; Sacral dressing soiled, and assisted with placing a new one; wound underneath and RN present and aware    Exercises     Shoulder Instructions      Home Living Family/patient expects to be discharged to:: Private residence Living Arrangements: Alone Available Help at Discharge: Other (Comment) (unsure how much family can assist) Type of Home: House Home Access: Stairs to enter     Home Layout: One level     Bathroom Shower/Tub: Tub/shower unit             Additional Comments: Unclear home situation and prior level of function; difficult to discern exactly how long ago the last time pt got up and OOB was. Reported he tried to bathe every 6 months or so      Prior Functioning/Environment               Mobility Comments: Mostly bedbound? ADLs Comments: Reports his son brings over meals; unclear how pt managed toileting and other ADL. pt initially reporting ambulatory and using ramp into house but then  reports he has not been OOB in 4 months?        OT Problem List: Decreased strength;Decreased activity tolerance;Impaired balance (sitting and/or standing);Decreased cognition;Decreased safety awareness;Decreased knowledge of use of DME or AE;Cardiopulmonary status limiting activity;Obesity      OT Treatment/Interventions: Self-care/ADL training;Therapeutic exercise;DME and/or AE instruction;Balance training;Patient/family education;Therapeutic activities;Cognitive remediation/compensation    OT Goals(Current goals can be found in the care plan section) Acute Rehab OT Goals Patient Stated Goal: get better OT Goal Formulation: With patient Time For Goal Achievement: 09/27/23 Potential to Achieve Goals: Fair  OT Frequency: Min 1X/week    Co-evaluation PT/OT/SLP Co-Evaluation/Treatment: Yes Reason for Co-Treatment: Complexity of the patient's impairments (multi-system involvement);Necessary to address cognition/behavior during functional activity;For patient/therapist safety;To address functional/ADL transfers PT goals addressed during session: Mobility/safety with mobility OT goals addressed during session: ADL's and self-care      AM-PAC OT "6 Clicks" Daily Activity     Outcome Measure Help from another person eating meals?: A Little Help from another person taking care of personal grooming?: A Little  Help from another person toileting, which includes using toliet, bedpan, or urinal?: Total Help from another person bathing (including washing, rinsing, drying)?: A Lot Help from another person to put on and taking off regular upper body clothing?: A Little Help from another person to put on and taking off regular lower body clothing?: Total 6 Click Score: 13   End of Session Equipment Utilized During Treatment: Oxygen Nurse Communication: Mobility status  Activity Tolerance: Patient tolerated treatment well Patient left: in bed;with call bell/phone within reach;with bed alarm  set  OT Visit Diagnosis: Unsteadiness on feet (R26.81);Muscle weakness (generalized) (M62.81);Other symptoms and signs involving cognitive function                Time: 1478-2956 OT Time Calculation (min): 40 min Charges:  OT General Charges $OT Visit: 1 Visit OT Evaluation $OT Eval Moderate Complexity: 1 Mod  Tyler Deis, OTR/L [facility] Acute Rehabilitation Office: [phone]   Myrla Halsted 09/13/2023, 5:56 PM

## 2023-09-13 NOTE — Consult Note (Signed)
Consultation Note Date: 09/13/2023   Patient Name: Jack [name]  DOB: 12-25-1945  MRN: [id number]  Age / Sex: 78 y.o., male  PCP: Toma Deiters, MD Referring Physician: Lonia Blood, MD  Reason for Consultation: Establishing goals of care  HPI/Patient Profile: 78 y.o. male  with past medical history of hypertension, CAD s/p CABG, hyperlipidemia admitted as transfer from [facility] on 09/11/2023 with foot wounds and PAD. Vascular has noted that treatment would involve bilateral AKA.   Clinical Assessment and Goals of Care: Consult received and chart review completed. I met today with Jack [name]. He is very lethargic. He awakens briefly but cannot stay awake. He is able to tell me he is in the hospital. I attempted to discuss with him his medical issues and decisions ahead but he is unable to stay awake to discuss. I called son, Jack [name], and made plan to meet him at bedside to discuss.   I met today with Jack [name] but his father is still asleep. We discussed significant poor blood flow into legs and recommendations that above the knee amputations would be needed. We discussed expectations that wounds would not be able to heal. We discussed that there is no active infection at this time but high risk for recurrent infection due to lack of healing. We discussed his father's overall poor quality of life and the risks vs benefits of pursuing amputations. Unfortunately if Jack [name] already has very poor quality of life than I would not expect that amputation will make this better but much worse. I worry that amputation could lead to worsening quality of life and Jack [name] motivation too improve can also impact his ability to recover from amputation. Jack [name] also explained and participated in conversation with Jack [name].   Jack [name] and I discuss further and he feels that his father would not desire amputation. Jack [name] reports  that his father does still have capacity to make his own decisions and is more able to have good conversations in the evenings. Jack [name] will discuss with his family and we will continue to discuss best path forward. We did spend time also discussing hospice support for his father. Seems he would be open to considering hospice at home.   All questions/concerns addressed. Emotional support provided.   Primary Decision Maker PATIENT? With support from family - 2 sons are surrogate decision makers    SUMMARY OF RECOMMENDATIONS   - Ongoing conversation regarding desire to pursue amputation - I will follow up again tomorrow to continue conversation  Code Status/Advance Care Planning: Full code - did not discuss today   Symptom Management:  Per attending, vascular  Prognosis:  Overall prognosis poor.   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 The following aspects are pertinent.  Past Medical History:  Diagnosis Date   Bradycardia    Coronary atherosclerosis of native coronary artery    Multivessel status post CABG   History of kidney stones    Hyperlipidemia    Hypertension    Tobacco user    Social History   Socioeconomic History   Marital status: Widowed    Spouse name: Not on file   Number of children: Not on file   Years of education: Not on file   Highest education level: Not on file  Occupational History   Occupation: retired  Tobacco Use   Smoking status: Every Day    Current packs/day: 1.00    Average packs/day: 1 pack/day for 64.3 years (64.3 ttl pk-yrs)    Types: Cigarettes    Start date: 06/03/1959   Smokeless  tobacco: Never  Vaping Use   Vaping status: Never Used  Substance and Sexual Activity   Alcohol use: Yes    Alcohol/week: 0.0 standard drinks of alcohol   Drug use: No   Sexual activity: Not on file  Other Topics Concern   Not on file  Social History Narrative   Had been married for over 51 years and wife passed away in 12-Oct-2015 from cancer. Has children and grandchildren and great grandchildren. Gets together with family for dinner frequently. Lives alone and cooks for self. Live in [place]. 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 Eats all food groups.     Social Drivers of Corporate investment banker Strain: Low Risk  (09/04/2023)   Received from [facility]   Overall Financial Resource Strain (CARDIA)    Difficulty of Paying Living Expenses: Not hard at all  Food Insecurity: No Food Insecurity (09/11/2023)   Hunger Vital Sign    Worried About Running Out of Food in the Last Year: Never true    Ran Out of Food in the Last Year: Never true  Transportation Needs: No Transportation Needs (09/11/2023)   PRAPARE - Administrator, Civil Service (Medical): No    Lack of Transportation (Non-Medical): No  Physical Activity: Not on file  Stress: Not on file  Social Connections: Socially Isolated (09/11/2023)   Social Connection and Isolation Panel [NHANES]    Frequency of Communication with Friends and Family: More than three times a week    Frequency of Social Gatherings with Friends and Family: Twice a week    Attends Religious Services: Never    Database administrator or Organizations: No    Attends Banker Meetings: Patient unable to answer    Marital Status: Widowed   Family History  Problem Relation Age of Onset   Heart attack Mother    Cerebral aneurysm Father    Coronary artery disease Neg Hx    Diabetes Neg Hx    Hypertension Neg Hx    Scheduled Meds:  cyanocobalamin  1,000 mcg  Subcutaneous Daily   enoxaparin (LOVENOX) injection  40 mg Subcutaneous Q24H   mupirocin ointment  1 Application Nasal BID   rosuvastatin  20 mg Oral Daily   Continuous Infusions: PRN Meds:.acetaminophen **OR** [DISCONTINUED] acetaminophen, ondansetron **OR** ondansetron (ZOFRAN) IV No Known Allergies Review of Systems  Unable to perform ROS: Mental status change    Physical Exam Vitals and nursing note reviewed.  Constitutional:  General: He is sleeping. He is not in acute distress.    Appearance: He is ill-appearing.  Cardiovascular:     Rate and Rhythm: Normal rate.  Pulmonary:     Effort: No tachypnea, accessory muscle usage or respiratory distress.  Abdominal:     Palpations: Abdomen is soft.  Feet:     Comments: Edema and redness to bilateral legs. Necrotic appearing wounds of bilateral heels.  Neurological:     Mental Status: He is lethargic.     Comments: Oriented to person and place. Unable to stay awake to further assess.      Vital Signs: BP (!) 159/79 (BP Location: Right Arm)   Pulse 81   Temp 97.8 F (36.6 C) (Oral)   Resp 18   Ht 5\' 10"  (1.778 m)   Wt 91.6 kg   SpO2 96%   BMI 28.98 kg/m  Pain Scale: 0-10   Pain Score: Asleep   SpO2: SpO2: 96 % O2 Device:SpO2: 96 % O2 Flow Rate: .O2 Flow Rate (L/min): 2 L/min  IO: Intake/output summary:  Intake/Output Summary (Last 24 hours) at 09/13/2023 1033 Last data filed at 09/13/2023 0522 Gross per 24 hour  Intake --  Output 301 ml  Net -301 ml    LBM: Last BM Date : 09/13/23 Baseline Weight: Weight: 91.6 kg Most recent weight: Weight: 91.6 kg     Palliative Assessment/Data:    Time Total: 75 min  Greater than 50%  of this time was spent counseling and coordinating care related to the above assessment and plan.  Signed by: Yong Channel, NP Palliative Medicine Team Pager # [phone] (M-F 8a-5p) Team Phone # [phone] (Nights/Weekends)

## 2023-09-13 NOTE — Progress Notes (Signed)
SLP Cancellation Note  Patient Details Name: Jack [name] MRN: [id number] DOB: 1945/12/12   Cancelled treatment:       Reason Eval/Treat Not Completed: Patient at procedure or test/unavailable (Pt and family currently in meeting with PMT; SLP will follow up.)  Dariyah Garduno I. Vear Clock, MS, CCC-SLP Acute Rehabilitation Services Office number [phone]  Scheryl Marten 09/13/2023, 12:19 PM

## 2023-09-13 NOTE — Progress Notes (Signed)
Jack [name]  WUJ:[id number] DOB: 04-20-1946 DOA: 09/11/2023 PCP: Toma Deiters, MD    Brief Narrative:  78 year old with a history of HTN, CAD status post CABG, and HLD who admits to not taking his usual medications or caring for himself appropriately since the death of his wife in 2015-10-10.  He was admitted to [facility] 09/03/2023 for evaluation of what was felt to be bilateral lower extremity cellulitis with ulcerations.  He underwent I&D at that facility and was treated with empiric antibiotics.  Wound cultures grew MRSA sensitive to doxycycline.  ABIs of the right leg were noted to be significantly diminished and it was felt the patient would likely require vascular intervention for wound healing.  Attempts were made to transfer him starting 1/13 but due to limited bed availability he did not arrive at [facility] until late night 09/11/2023.  On his arrival at [facility] the patient has noted to have extensive wounds to both lower extremities but with no evidence of acute infection at this time.  Most prominent are deep bilateral heel ulcers with a thick overlying eschar.  Goals of Care:   Code Status: Full Code   DVT prophylaxis: enoxaparin (LOVENOX) injection 40 mg Start: 09/12/23 1600  Interim Hx: No acute events recorded overnight.  The patient is afebrile with stable vitals.  He is dramatically more alert today.  He is conversant and even oriented.  He understands why he is here.  He denies chest pain or shortness of breath.  I met with the patient's son in the hallway along with the Palliative Care NP and we had a lengthy discussion about treatment options and considerations in this complex situation.  Assessment & Plan:  Ischemic v/s pressure ulcerations B heels - PAD Abnormal ABI at [facility] reportedly discussed with [facility] V[facility] Surgery with transfer to [facility] for further investigation and potential intervention -on inspection of both lower extremities there is no clinical  evidence to suggest an active current infection therefore antibiotics have been discontinued - Vasc Surgery has seen the patient at bedside - ABIs obtained at [facility] 1/18 are consistent with severe right and left lower extremity arterial disease at 0.37 and 0.32 respectively  Bilateral lower extremity ulcerations and deep tissue injury to B heels Continue local wound care -heel wounds are consistent with pressure related injury likely from patient simply laying in bed at home -very poor ABIs suggest these wounds will not be able to heal but it is also not clear that bilateral lower extremity amputations will be in his best interest either -Palliative Care has been consulted to assist in establishing goals of care  Acute delirium On arrival at [facility] it was appreciated the patient was very lethargic -he was able to answer some simple questions but required frequent tactile stimulus to maintain alertness - family confirmed this was not his baseline - ammonia level is normal -TSH is modestly elevated but free T4 is normal -B12 is low at 207 -folic acid is normal -RPR pending -CT head reveals pronounced parenchymal atrophy and chronic small vessel ischemic disease suggestive of possible vascular/age-related dementia -today the patient is dramatically improved and fully oriented -continue to monitor without further evaluation planned at this time  Vitamin B12 deficiency Initiate supplementation -will need follow-up with B12 level in outpatient setting in 8-12 weeks  Self-neglect PT/OT and social work consults -if prolonged placement becomes an issue patient is to be transferred back to [facility] after his vascular surgery care is completed  CKD stage IIIa Baseline creatinine appears to be approximately 1.4 -creatinine stable  Normocytic anemia Likely anemia of chronic kidney disease - Fe studies consistent with this with markedly low TIBC at 176  HTN BP reasonably well-controlled -monitor without change in  treatment for now  HLD Continue usual statin   Family Communication: Spoke with patient's son in the hallway at length Disposition: Unclear at present   Objective: Blood pressure (!) 159/79, pulse 81, temperature 97.8 F (36.6 C), temperature source Oral, resp. rate 18, height 5\' 10"  (1.778 m), weight 91.6 kg, SpO2 96%.  Intake/Output Summary (Last 24 hours) at 09/13/2023 0912 Last data filed at 09/13/2023 0522 Gross per 24 hour  Intake --  Output 301 ml  Net -301 ml   Filed Weights   09/12/23 0044  Weight: 91.6 kg    Examination: General: No acute respiratory distress -alert and oriented x 3 -conversant and pleasant Lungs: Clear to auscultation bilaterally without wheezes or crackles Cardiovascular: Regular rate and rhythm without murmur  Abdomen: Nontender, nondistended, soft, bowel sounds positive, no rebound, no ascites, no appreciable mass Extremities: multiple superficial cutaneous wounds bilateral lower extremities from knees down to ankles with bilateral deep heel ulcerations suggestive of pressure injury without purulent discharge or significant erythema of the extremities  CBC: Recent Labs  Lab 09/12/23 0128 09/13/23 0459  WBC 11.2* 10.5  NEUTROABS 8.4*  --   HGB 11.6* 12.3*  HCT 36.3* 38.0*  MCV 99.5 97.7  PLT 206 194   Basic Metabolic Panel: Recent Labs  Lab 09/12/23 0128 09/13/23 0459  NA 136 138  K 4.3 4.4  CL 99 98  CO2 28 28  GLUCOSE 114* 101*  BUN 26* 24*  CREATININE 1.23 1.13  CALCIUM 8.1* 8.5*  MG 2.3 2.3  PHOS 2.9  --    GFR: Estimated Creatinine Clearance: 62.3 mL/min (by C-G formula based on SCr of 1.13 mg/dL).   Scheduled Meds:  cyanocobalamin  1,000 mcg Subcutaneous Daily   enoxaparin (LOVENOX) injection  40 mg Subcutaneous Q24H   mupirocin ointment  1 Application Nasal BID   rosuvastatin  20 mg Oral Daily     LOS: 2 days   Lonia Blood, MD [facility] Office  [phone] Pager - Text Page per Loretha Stapler  If  7PM-7AM, please contact night-coverage per Amion 09/13/2023, 9:12 AM

## 2023-09-14 DIAGNOSIS — I70235 Atherosclerosis of native arteries of right leg with ulceration of other part of foot: Secondary | ICD-10-CM | POA: Diagnosis not present

## 2023-09-14 DIAGNOSIS — Z515 Encounter for palliative care: Secondary | ICD-10-CM | POA: Diagnosis not present

## 2023-09-14 DIAGNOSIS — L97519 Non-pressure chronic ulcer of other part of right foot with unspecified severity: Secondary | ICD-10-CM | POA: Diagnosis not present

## 2023-09-14 DIAGNOSIS — Z7189 Other specified counseling: Secondary | ICD-10-CM | POA: Diagnosis not present

## 2023-09-14 DIAGNOSIS — E440 Moderate protein-calorie malnutrition: Secondary | ICD-10-CM | POA: Insufficient documentation

## 2023-09-14 MED ORDER — ENSURE ENLIVE PO LIQD
237.0000 mL | Freq: Two times a day (BID) | ORAL | Status: DC
Start: 2023-09-14 — End: 2023-10-07
  Administered 2023-09-14 – 2023-10-07 (×38): 237 mL via ORAL

## 2023-09-14 MED ORDER — VITAMIN C 500 MG PO TABS
250.0000 mg | ORAL_TABLET | Freq: Two times a day (BID) | ORAL | Status: DC
Start: 2023-09-14 — End: 2023-10-07
  Administered 2023-09-14 – 2023-10-07 (×47): 250 mg via ORAL
  Filled 2023-09-14 (×48): qty 1

## 2023-09-14 MED ORDER — ZINC SULFATE 220 (50 ZN) MG PO CAPS
220.0000 mg | ORAL_CAPSULE | Freq: Every day | ORAL | Status: DC
  Administered 2023-09-14 – 2023-09-29 (×16): 220 mg via ORAL
  Filled 2023-09-14 (×17): qty 1

## 2023-09-14 NOTE — Progress Notes (Signed)
Speech Language Pathology Treatment: Dysphagia  Patient Details Name: Jack [name] MRN: [id number] DOB: 1946-07-11 Today's Date: 09/14/2023 Time: 1549-1600 SLP Time Calculation (min) (ACUTE ONLY): 11 min  Assessment / Plan / Recommendation Clinical Impression  Pt has made improvements related to mentation and orientation. Observed pt self feed trials of thin liquids and regular texture solids without overt s/s of dysphagia or aspiration. Despite edentulism, he achieves timely mastication and complete oral clearance. Recommend upgrading diet to regular solids and continuing thin liquids. No further SLP f/u is needed, will s/o.    HPI HPI: Pt is a 78 year old who was transferred to [facility] on 1/17 for decreased ABI and vascular surgery evaluation. Pt initially admitted to [facility] 09/03/2023 for evaluation of what was felt to be bilateral lower extremity cellulitis with ulcerations. He underwent I&D at that facility and was treated with empiric antibiotics.  Wound cultures grew MRSA sensitive to doxycycline. ABIs of the right leg were noted to be significantly diminished and it was felt the patient would likely require vascular intervention for wound healing. PMH: HTN, CAD s/p CABG, HLD.      SLP Plan  All goals met      Recommendations for follow up therapy are one component of a multi-disciplinary discharge planning process, led by the attending physician.  Recommendations may be updated based on patient status, additional functional criteria and insurance authorization.    Recommendations  Diet recommendations: Regular;Thin liquid Liquids provided via: Cup;Straw Medication Administration: Whole meds with puree Supervision: Patient able to self feed;Full supervision/cueing for compensatory strategies Compensations: Minimize environmental distractions;Slow rate;Small sips/bites Postural Changes and/or Swallow Maneuvers: Seated upright 90 degrees                   Oral care BID   Frequent or constant Supervision/Assistance Dysphagia, unspecified (R13.10)     All goals met     Gwynneth Aliment, M.A., CF-SLP Speech Language Pathology, Acute Rehabilitation Services  Secure Chat preferred [phone]   09/14/2023, 4:05 PM

## 2023-09-14 NOTE — Progress Notes (Signed)
Jack [name]  CBJ:[id number] DOB: 06/16/1946 DOA: 09/11/2023 PCP: Toma Deiters, MD    Brief Narrative:  78 year old with a history of HTN, CAD status post CABG, and HLD who admits to not taking his usual medications or caring for himself appropriately since the death of his wife in Oct 14, 2015.  He was admitted to [facility] 09/03/2023 for evaluation of what was felt to be bilateral lower extremity cellulitis with ulcerations.  He underwent I&D at that facility and was treated with empiric antibiotics.  Wound cultures grew MRSA sensitive to doxycycline.  ABIs of the right leg were noted to be significantly diminished and it was felt the patient would likely require vascular intervention for wound healing.  Attempts were made to transfer him starting 1/13 but due to limited bed availability he did not arrive at [facility] until late night 09/11/2023.  On his arrival at [facility] the patient has noted to have extensive wounds to both lower extremities but with no evidence of acute infection at this time.  Most prominent are deep bilateral heel ulcers with a thick overlying eschar.  Goals of Care:   Code Status: Full Code   DVT prophylaxis: enoxaparin (LOVENOX) injection 40 mg Start: 09/12/23 1600  Interim Hx: No acute events reported overnight.  Afebrile.  Vital signs stable.  The patient is alert and conversant at the time of my visit today.  He is pleasant but seems mildly confused.  I have discussed his situation with him.  He tells me he would not be interested in pursuing amputation.  His focus appears primarily to be on getting home.  He denies chest pain or shortness of breath.  He reports a good appetite and enjoys the hospital food.  Assessment & Plan:  Ischemic v/s pressure ulcerations B heels - PAD Abnormal ABI at [facility] reportedly discussed with [facility] Vascular Surgery with transfer to [facility] for further investigation and potential intervention -on inspection of both lower extremities  there is no clinical evidence to suggest an active current infection therefore antibiotics have been discontinued - Vasc Surgery has seen the patient at bedside - ABIs obtained at [facility] 1/18 are consistent with severe right and left lower extremity arterial disease at 0.37 and 0.32 respectively  Bilateral lower extremity ulcerations and deep tissue injury to B heels Continue local wound care -heel wounds are consistent with pressure related injury likely from patient simply laying in bed at home -very poor ABIs suggest these wounds will not be able to heal but it is also not clear that bilateral lower extremity amputations will be in his best interest either - Palliative Care is assisting the patient/family in investigating treatment options  Acute delirium versus dementia On arrival at [facility] it was appreciated the patient was very lethargic -he was able to answer some simple questions but required frequent tactile stimulus to maintain alertness - family confirmed this was not his baseline - ammonia level is normal -TSH is modestly elevated but free T4 is normal - B12 is low at 207 - folic acid is normal - RPR nonreactive - CT head reveals pronounced parenchymal atrophy and chronic small vessel ischemic disease suggestive of possible vascular/age-related dementia -since admission the patient has exhibited periods of lucidity and it appears that at baseline his mental status likely waxes and wanes -investigation suggest this is not an acute issue but is likely his baseline  Vitamin B12 deficiency Initiated supplementation  Self-neglect PT/OT and social work consults - if prolonged placement  becomes an issue patient is to be transferred back to [facility]   CKD stage IIIa Baseline creatinine appears to be approximately 1.4 - creatinine stable/improved  Normocytic anemia Likely anemia of chronic kidney disease - Fe studies consistent with this with markedly low TIBC at 176  HTN BP reasonably  well-controlled -monitor without change in treatment for now  HLD Continue usual statin  Nutrition Problem: Moderate Malnutrition Etiology: chronic illness, social / environmental circumstances Signs/Symptoms: moderate muscle depletion, mild fat depletion, moderate fat depletion Interventions: Ensure Enlive (each supplement provides 350kcal and 20 grams of protein), Magic cup, MVI   Family Communication: No family present at time of my exam Disposition: Unclear at present   Objective: Blood pressure (!) 142/79, pulse 95, temperature 98.7 F (37.1 C), temperature source Oral, resp. rate 16, height 5\' 10"  (1.778 m), weight 91.6 kg, SpO2 92%. No intake or output data in the 24 hours ending 09/14/23 0933  Filed Weights   09/12/23 0044  Weight: 91.6 kg    Examination: General: No acute respiratory distress -alert and oriented x 3 -conversant and pleasant Lungs: Clear to auscultation bilaterally without wheezes or crackles Cardiovascular: Regular rate and rhythm without murmur  Abdomen: Nontender, nondistended, soft, bowel sounds positive, no rebound, no ascites, no appreciable mass Extremities: multiple superficial cutaneous wounds bilateral lower extremities from knees down to ankles with bilateral deep heel ulcerations suggestive of pressure injury without purulent discharge or significant erythema of the extremities without significant change  CBC: Recent Labs  Lab 09/12/23 0128 09/13/23 0459  WBC 11.2* 10.5  NEUTROABS 8.4*  --   HGB 11.6* 12.3*  HCT 36.3* 38.0*  MCV 99.5 97.7  PLT 206 194   Basic Metabolic Panel: Recent Labs  Lab 09/12/23 0128 09/13/23 0459  NA 136 138  K 4.3 4.4  CL 99 98  CO2 28 28  GLUCOSE 114* 101*  BUN 26* 24*  CREATININE 1.23 1.13  CALCIUM 8.1* 8.5*  MG 2.3 2.3  PHOS 2.9  --    GFR: Estimated Creatinine Clearance: 62.3 mL/min (by C-G formula based on SCr of 1.13 mg/dL).   Scheduled Meds:  cyanocobalamin  1,000 mcg Subcutaneous  Daily   enoxaparin (LOVENOX) injection  40 mg Subcutaneous Q24H   mupirocin ointment  1 Application Nasal BID   rosuvastatin  20 mg Oral Daily     LOS: 3 days   Lonia Blood, MD [facility] Office  [phone] Pager - Text Page per Loretha Stapler  If 7PM-7AM, please contact night-coverage per Amion 09/14/2023, 9:33 AM

## 2023-09-14 NOTE — Progress Notes (Addendum)
  Progress Note    09/14/2023 8:37 AM * No surgery found *  Subjective:  no complaints    Vitals:   09/14/23 0450 09/14/23 0814  BP: 136/69 (!) 142/79  Pulse: 84 95  Resp: 16 16  Temp: 99.1 F (37.3 C) 98.7 F (37.1 C)  SpO2: 95% 92%    Physical Exam: General:  resting comfortably in bed Lungs:  nonlabored Extremities:  bandaging over several BLE wounds  CBC    Component Value Date/Time   WBC 10.5 09/13/2023 0459   RBC 3.89 (L) 09/13/2023 0459   RBC 3.83 (L) 09/13/2023 0459   HGB 12.3 (L) 09/13/2023 0459   HCT 38.0 (L) 09/13/2023 0459   PLT 194 09/13/2023 0459   MCV 97.7 09/13/2023 0459   MCH 31.6 09/13/2023 0459   MCHC 32.4 09/13/2023 0459   RDW 15.0 09/13/2023 0459   LYMPHSABS 1.9 09/12/2023 0128   MONOABS 0.7 09/12/2023 0128   EOSABS 0.1 09/12/2023 0128   BASOSABS 0.1 09/12/2023 0128    BMET    Component Value Date/Time   NA 138 09/13/2023 0459   K 4.4 09/13/2023 0459   CL 98 09/13/2023 0459   CO2 28 09/13/2023 0459   GLUCOSE 101 (H) 09/13/2023 0459   BUN 24 (H) 09/13/2023 0459   CREATININE 1.13 09/13/2023 0459   CREATININE 1.04 08/07/2017 1012   CALCIUM 8.5 (L) 09/13/2023 0459   GFRNONAA >60 09/13/2023 0459   GFRAA >60 01/23/2018 0856    INR    Component Value Date/Time   INR 1.06 01/23/2018 0856    No intake or output data in the 24 hours ending 09/14/23 0837    Assessment/Plan:  78 y.o. male with BLE wounds   -No significant changes to bilateral lower extremity wounds. Currently dry and bandaged -Continues to face high possibility of bilateral amputations -Pending goals of care discussions. Patient and family currently making decisions with palliative care.    Loel Dubonnet, PA-C [facility] [phone] 09/14/2023 8:37 AM  I have seen and evaluated the patient. I agree with the PA note as documented above.  78 year old male transferred here for evaluation of heel wounds.  Much more awake and alert today.  He  tells me he has been essentially bedbound for 6 months.  I discussed with his failure to thrive and current mobility he would best be served with bilateral above-knee amputations.  I think even if we were able to establish inline flow and debride his heel wounds it would be nearly impossible for these to heal with his overall state and going back to laying in bed and being immobile on a daily basis.  Discussed the other option would be palliative wound care.  I know palliative care is following and appreciate their help.  [name], MD [facility] [place] Office: [phone]

## 2023-09-14 NOTE — Progress Notes (Signed)
Initial Nutrition Assessment  DOCUMENTATION CODES:   Non-severe (moderate) malnutrition in context of chronic illness  INTERVENTION:   -Dysphagia II diet per SLP recommendation. -Please encourage intakes. -Ensure Enlive po BID, each supplement provides 350 kcal and 20 grams of protein. -Magic cup BID with meals, each supplement provides 290 kcal and 9 grams of protein -MVI/Minerals-1 Tab daily, vitamin C 250 mg BID, zinc sulfate 220 mg daily to support wound healing and suspected suboptimal nutritional intakes at home.   NUTRITION DIAGNOSIS:   Moderate Malnutrition related to chronic illness, social / environmental circumstances as evidenced by moderate muscle depletion, mild fat depletion, moderate fat depletion.    GOAL:   Patient will meet greater than or equal to 90% of their needs    MONITOR:   PO intake, Supplement acceptance, Labs, Weight trends, Skin  REASON FOR ASSESSMENT:   Consult Wound healing  ASSESSMENT: 78 y/o male admitted with foot ulcer with PAD. Recent admit on 1/9 to [facility] with bilateral leg infections associated with not removing socks for extremely long period of time.  Underwent I & D and treated with antibiotics.  Wound cultures grew MRSA. PMH: HTN, CAD S/P CABG, HLD, bradycardia, kidney stones, HTN, tobacco use, cholecystectomy.   Patient is hard of hearing, answers questions with repeated attempts. He generally consumes two meals daily, his son helps with his meal preparation or brings him food. Endorses a fine appetite. B: eggs, bacon, pancakes, sausage, supper includes some type of meat with a biscuit. He likes to eat fruit at various times in the day. Suspected suboptimal nutritional intakes at home. Patient was noted to have not been taking of himself since loss of his wife 2017. Today at B he had eggs with juice.  Discussed nutrition recommendations to support wound healing, including Juven which patient refuses, agrees to Ensure.  Edentulous,  dentures are at home, denies need for softer textures. Denies unintentional weight loss, stated usual weight 280#. Per review of EMR, no weight loss identified since 08/02/2018.  Medications reviewed and include vitamin B12 1,000 mcg daily.  Labs reviewed  NUTRITION - FOCUSED PHYSICAL EXAM:  Flowsheet Row Most Recent Value  Orbital Region Moderate depletion  Upper Arm Region No depletion  Thoracic and Lumbar Region No depletion  Buccal Region Mild depletion  Temple Region Mild depletion  Clavicle Bone Region Moderate depletion  Clavicle and Acromion Bone Region Moderate depletion  Scapular Bone Region Moderate depletion  Dorsal Hand Moderate depletion  Patellar Region Moderate depletion  Anterior Thigh Region Moderate depletion  Posterior Calf Region Unable to assess  [wounds]  Edema (RD Assessment) None  Hair Reviewed  Eyes Reviewed  Mouth Reviewed  Skin Reviewed  Nails Reviewed       Diet Order:   Diet Order             DIET DYS 2 Room service appropriate? No; Fluid consistency: Thin  Diet effective now                   EDUCATION NEEDS:   Education needs have been addressed  Skin:  Skin Assessment: Skin Integrity Issues: Skin Integrity Issues:: Stage I Stage I: sacrum  Last BM:  09/13/2023  Height:   Ht Readings from Last 1 Encounters:  09/12/23 5\' 10"  (1.778 m)    Weight:   Wt Readings from Last 1 Encounters:  09/12/23 91.6 kg    Ideal Body Weight:  75.5 kg  BMI:  Body mass index is 28.98 kg/m.  Estimated  Nutritional Needs:   Kcal:  1900-2150 kcal/day  Protein:  98-113 gm/day  Fluid:  1900-2150 mL/day    Alvino Chapel, RDLD Clinical Dietitian If unable to reach, please contact "RD Inpatient" secure chat group between 8 am-4 pm daily"

## 2023-09-14 NOTE — Progress Notes (Signed)
Pt has PT recommendation for SNF, however, per palliative team, GOC are still ongoing.  TOC will contineu to follow but will wait for further clarification. Daleen Squibb, MSW, LCSW 1/20/20251:37 PM

## 2023-09-15 DIAGNOSIS — Z7189 Other specified counseling: Secondary | ICD-10-CM | POA: Diagnosis not present

## 2023-09-15 DIAGNOSIS — I70235 Atherosclerosis of native arteries of right leg with ulceration of other part of foot: Secondary | ICD-10-CM | POA: Diagnosis not present

## 2023-09-15 DIAGNOSIS — L97519 Non-pressure chronic ulcer of other part of right foot with unspecified severity: Secondary | ICD-10-CM | POA: Diagnosis not present

## 2023-09-15 DIAGNOSIS — Z515 Encounter for palliative care: Secondary | ICD-10-CM | POA: Diagnosis not present

## 2023-09-15 NOTE — Progress Notes (Signed)
Jack [name]  ZOX:[id number] DOB: [birth year] DOA: 09/11/2023 PCP: Toma Deiters, MD    Brief Narrative:  78 year old with a history of HTN, CAD status post CABG, and HLD who admits to not taking his usual medications or caring for himself appropriately since the death of his wife in 2015/10/01.  He was admitted to [facility] 09/03/2023 for evaluation of what was felt to be bilateral lower extremity cellulitis with ulcerations.  He underwent I&D at that facility and was treated with empiric antibiotics.  Wound cultures grew MRSA sensitive to doxycycline.  ABIs of the right leg were noted to be significantly diminished and it was felt the patient would likely require vascular intervention for wound healing.  Attempts were made to transfer him starting 1/13 but due to limited bed availability he did not arrive at [facility] until late night 09/11/2023.  On his arrival at [facility] the patient has noted to have extensive wounds to both lower extremities but with no evidence of acute infection at this time.  Most prominent are deep bilateral heel ulcers with a thick overlying eschar.  Goals of Care:   Code Status: Limited: Do not attempt resuscitation (DNR) -DNR-LIMITED -Do Not Intubate/DNI    DVT prophylaxis: enoxaparin (LOVENOX) injection 40 mg Start: 09/12/23 1600  Interim Hx: No acute events recorded overnight.  Afebrile.  Vital signs stable.  The patient is resting comfortably in bed at the time of my visit.  He is pleasant and interactive.  He tells me he very much enjoys the food here and is eating without difficulty.  He denies any discomfort in his legs.  He tells me his primary objective and focus is to get home.  When I explain the option of bilateral above-the-knee amputations versus conservative nonhealing wound care he explains to me again that he would not wish to have his leg amputated as long as he could still go home.  He is generally with the palliative care team and his family tomorrow  morning for more extensive discussion regarding his options.  Assessment & Plan:  Ischemic v/s pressure ulcerations B heels - PAD Abnormal ABI at [facility] discussed with [facility] Vascular Surgery with transfer to [facility] for further investigation and potential intervention - on inspection of both lower extremities there is no clinical evidence to suggest an active current infection therefore antibiotics have been discontinued - Vasc Surgery has seen the patient at bedside - ABIs obtained at [facility] 1/18 are consistent with severe right and left lower extremity arterial disease at 0.37 and 0.32 respectively  Bilateral lower extremity ulcerations and deep tissue injury to B heels Continue local wound care -heel wounds are consistent with pressure related injury likely from patient simply laying in bed at home -very poor ABIs suggest these wounds will not be able to heal but it is also not clear that bilateral lower extremity amputations will be in his best interest either - Palliative Care is assisting the patient/family in investigating treatment options with the family conference scheduled for 09/16/2023  Acute delirium versus dementia On arrival at [facility] it was appreciated the patient was very lethargic -he was able to answer some simple questions but required frequent tactile stimulus to maintain alertness - family confirmed this was not his baseline - ammonia level is normal -TSH is modestly elevated but free T4 is normal - B12 is low at 207 - folic acid is normal - RPR nonreactive - CT head reveals pronounced parenchymal atrophy and chronic small  vessel ischemic disease suggestive of vascular/age-related dementia - since admission the patient has exhibited periods of lucidity and it appears that at baseline his mental status likely waxes and wanes - investigation suggest this is not an acute issue but is likely his baseline  Vitamin B12 deficiency Initiated supplementation  Self-neglect PT/OT and social  work consults - if prolonged placement becomes an issue patient is to be transferred back to [facility]   CKD stage IIIa Baseline creatinine appears to be approximately 1.4 - creatinine stable/improved  Normocytic anemia Likely anemia of chronic kidney disease - Fe studies consistent with this with markedly low TIBC at 176  HTN BP reasonably well-controlled -monitor without change in treatment for now  HLD Continue usual statin  Nutrition Problem: Moderate Malnutrition Etiology: chronic illness, social / environmental circumstances Signs/Symptoms: moderate muscle depletion, mild fat depletion, moderate fat depletion Interventions: Ensure Enlive (each supplement provides 350kcal and 20 grams of protein), Magic cup, MVI   Family Communication: No family present at time of my exam Disposition: Unclear at present -patient himself simply desires a return home -I suspect local wound care and maximal assistance at home realizing that it will not be curative is the better option given his expressed goals instead of bilateral AKA's -will await outcome of family discussion tomorrow   Objective: Blood pressure (!) 149/70, pulse 94, temperature 98.4 F (36.9 C), temperature source Oral, resp. rate 18, height 5\' 10"  (1.778 m), weight 91.6 kg, SpO2 95%. No intake or output data in the 24 hours ending 09/15/23 1030  Filed Weights   09/12/23 0044  Weight: 91.6 kg    Examination: General: No acute respiratory distress -alert and oriented x 3  Lungs: Clear to auscultation bilaterally  Cardiovascular: Regular rate and rhythm without murmur  Abdomen: NT/ND, soft, BS positive Extremities: multiple superficial cutaneous wounds bilateral lower extremities from knees down to ankles with bilateral deep heel ulcerations suggestive of pressure injury without purulent discharge or significant erythema of the extremities without significant change  CBC: Recent Labs  Lab 09/12/23 0128 09/13/23 0459  WBC  11.2* 10.5  NEUTROABS 8.4*  --   HGB 11.6* 12.3*  HCT 36.3* 38.0*  MCV 99.5 97.7  PLT 206 194   Basic Metabolic Panel: Recent Labs  Lab 09/12/23 0128 09/13/23 0459  NA 136 138  K 4.3 4.4  CL 99 98  CO2 28 28  GLUCOSE 114* 101*  BUN 26* 24*  CREATININE 1.23 1.13  CALCIUM 8.1* 8.5*  MG 2.3 2.3  PHOS 2.9  --    GFR: Estimated Creatinine Clearance: 62.3 mL/min (by C-G formula based on SCr of 1.13 mg/dL).   Scheduled Meds:  ascorbic acid  250 mg Oral BID   cyanocobalamin  1,000 mcg Subcutaneous Daily   enoxaparin (LOVENOX) injection  40 mg Subcutaneous Q24H   feeding supplement  237 mL Oral BID BM   mupirocin ointment  1 Application Nasal BID   rosuvastatin  20 mg Oral Daily   zinc sulfate (50mg  elemental zinc)  220 mg Oral Daily     LOS: 4 days   Lonia Blood, MD [facility] Office  [phone] Pager - Text Page per Loretha Stapler  If 7PM-7AM, please contact night-coverage per Amion 09/15/2023, 10:30 AM

## 2023-09-15 NOTE — Progress Notes (Signed)
Palliative:  Palliative shadowing for needs. TOC working on discharge to nursing facility with hospice to follow. Titrate medications as needed to ensure comfort. Recommend PRN oxycodone, ativan, haldol sublingual to be provided at time of discharge.   No charge  Yong Channel, NP Palliative Medicine Team Pager [phone] (Please see [email or web address] for schedule) Team Phone [phone]

## 2023-09-16 DIAGNOSIS — L97519 Non-pressure chronic ulcer of other part of right foot with unspecified severity: Secondary | ICD-10-CM | POA: Diagnosis not present

## 2023-09-16 DIAGNOSIS — I70235 Atherosclerosis of native arteries of right leg with ulceration of other part of foot: Secondary | ICD-10-CM | POA: Diagnosis not present

## 2023-09-16 DIAGNOSIS — Z7189 Other specified counseling: Secondary | ICD-10-CM | POA: Diagnosis not present

## 2023-09-16 DIAGNOSIS — Z515 Encounter for palliative care: Secondary | ICD-10-CM | POA: Diagnosis not present

## 2023-09-16 LAB — CBC
HCT: 35.4 % — ABNORMAL LOW (ref 39.0–52.0)
Hemoglobin: 11.4 g/dL — ABNORMAL LOW (ref 13.0–17.0)
MCH: 31.6 pg (ref 26.0–34.0)
MCHC: 32.2 g/dL (ref 30.0–36.0)
MCV: 98.1 fL (ref 80.0–100.0)
Platelets: 329 10*3/uL (ref 150–400)
RBC: 3.61 MIL/uL — ABNORMAL LOW (ref 4.22–5.81)
RDW: 14.9 % (ref 11.5–15.5)
WBC: 9.3 10*3/uL (ref 4.0–10.5)
nRBC: 0 % (ref 0.0–0.2)

## 2023-09-16 LAB — BASIC METABOLIC PANEL
Anion gap: 8 (ref 5–15)
BUN: 15 mg/dL (ref 8–23)
CO2: 25 mmol/L (ref 22–32)
Calcium: 8.2 mg/dL — ABNORMAL LOW (ref 8.9–10.3)
Chloride: 99 mmol/L (ref 98–111)
Creatinine, Ser: 1.02 mg/dL (ref 0.61–1.24)
GFR, Estimated: >60 mL/min (ref >60)
Glucose, Bld: 107 mg/dL — ABNORMAL HIGH (ref 70–99)
Potassium: 4.5 mmol/L (ref 3.5–5.1)
Sodium: 132 mmol/L — ABNORMAL LOW (ref 135–145)

## 2023-09-16 NOTE — Progress Notes (Signed)
PROGRESS NOTE    Jack [name]  ZOX:[id number] DOB: Feb 20, 1946 DOA: 09/11/2023 PCP: Toma Deiters, MD     Brief Narrative:  Jack [name] is a 78 year old with a history of HTN, CAD status post CABG, and HLD who admits to not taking his usual medications or caring for himself appropriately since the death of his wife in 10-13-15.  He was admitted to [facility] 09/03/2023 for evaluation of what was felt to be bilateral lower extremity cellulitis with ulcerations.  He underwent I&D at that facility and was treated with empiric antibiotics.  Wound cultures grew MRSA sensitive to doxycycline.  ABIs of the right leg were noted to be significantly diminished and it was felt the patient would likely require vascular intervention for wound healing.  Attempts were made to transfer him starting 1/13 but due to limited bed availability he did not arrive at [facility] until late night 09/11/2023.  On his arrival at [facility] the patient has noted to have extensive wounds to both lower extremities but with no evidence of acute infection at this time.  Most prominent are deep bilateral heel ulcers with a thick overlying eschar.  New events last 24 hours / Subjective: No acute events.  Patient states that he is feeling okay.  Currently awaiting family meeting with palliative care medicine today to discuss goals of care and decision regarding amputation.  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  Patient and family continue to discuss.  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 (Active)  09/11/23 2350  Location: Sacrum  Location Orientation: Anterior;Mid  Staging: Stage 1 -  Intact skin with non-blanchable redness of a localized area usually over a bony prominence.  Wound Description (Comments):   Present on Admission: Yes     Nutrition Problem: Moderate Malnutrition Etiology: chronic illness, social / environmental circumstances   DVT prophylaxis:  enoxaparin (LOVENOX) injection 40 mg Start: 09/12/23 1600  Code Status: DNR Family Communication: None at bedside this morning Disposition Plan: TBD Status is: Inpatient Remains inpatient appropriate because: Awaiting family meeting with palliative care today    Antimicrobials:  Anti-infectives (From admission, onward)    Start     Dose/Rate Route Frequency Ordered Stop  09/12/23 0400  Vancomycin (VANCOCIN) 1,500 mg in sodium chloride 0.9 % 500 mL IVPB  Status:  Discontinued        1,500 mg 250 mL/hr over 120 Minutes Intravenous Every 24 hours 09/12/23 0244 09/12/23 1801   09/12/23 0300  vancomycin (VANCOREADY) IVPB 2000 mg/400 mL   Status:  Discontinued        2,000 mg 200 mL/hr over 120 Minutes Intravenous  Once 09/12/23 0142 09/12/23 0218   09/12/23 0200  cefTRIAXone (ROCEPHIN) 2 g in sodium chloride 0.9 % 100 mL IVPB  Status:  Discontinued        2 g 200 mL/hr over 30 Minutes Intravenous Every 24 hours 09/12/23 0127 09/12/23 1801   09/12/23 0200  metroNIDAZOLE (FLAGYL) IVPB 500 mg  Status:  Discontinued        500 mg 100 mL/hr over 60 Minutes Intravenous Every 12 hours 09/12/23 0127 09/12/23 1801        Objective: Vitals:   09/15/23 1332 09/15/23 1946 09/16/23 0500 09/16/23 1119  BP: 125/78 (!) 140/73 (!) 172/84 111/73  Pulse: 84 97 97 88  Resp: 17 18 18 17   Temp: 98.5 F (36.9 C) 98.3 F (36.8 C) 98.2 F (36.8 C) 98.2 F (36.8 C)  TempSrc: Oral Oral Oral Oral  SpO2: 96% 92% 93% 95%  Weight:      Height:        Intake/Output Summary (Last 24 hours) at 09/16/2023 1332 Last data filed at 09/16/2023 0532 Gross per 24 hour  Intake --  Output 701 ml  Net -701 ml   Filed Weights   09/12/23 0044  Weight: 91.6 kg    Examination:  General exam: Appears calm and comfortable  Respiratory system: Clear to auscultation. Respiratory effort normal. No respiratory distress. No conversational dyspnea.  Cardiovascular system: S1 & S2 heard, RRR. No murmurs. No pedal edema. Gastrointestinal system: Abdomen is nondistended, soft and nontender. Normal bowel sounds heard. Central nervous system: Alert and oriented. No focal neurological deficits. Speech clear.  Extremities: Symmetric in appearance  Skin: + On heels are covered in dressing.  He has areas of eschar on his lower extremity Psychiatry: Judgement and insight appear normal. Mood & affect appropriate.   Data Reviewed: I have personally reviewed following labs and imaging studies  CBC: Recent Labs  Lab 09/12/23 0128 09/13/23 0459 09/16/23 0550  WBC 11.2* 10.5 9.3  NEUTROABS 8.4*  --   --   HGB 11.6* 12.3* 11.4*  HCT 36.3* 38.0* 35.4*  MCV  99.5 97.7 98.1  PLT 206 194 329   Basic Metabolic Panel: Recent Labs  Lab 09/12/23 0128 09/13/23 0459 09/16/23 0550  NA 136 138 132*  K 4.3 4.4 4.5  CL 99 98 99  CO2 28 28 25   GLUCOSE 114* 101* 107*  BUN 26* 24* 15  CREATININE 1.23 1.13 1.02  CALCIUM 8.1* 8.5* 8.2*  MG 2.3 2.3  --   PHOS 2.9  --   --    GFR: Estimated Creatinine Clearance: 69 mL/min (by C-G formula based on SCr of 1.02 mg/dL). Liver Function Tests: Recent Labs  Lab 09/12/23 0128 09/13/23 0459  AST 73* 51*  ALT 59* 52*  ALKPHOS 94 86  BILITOT 0.9 0.8  PROT 5.9* 6.2*  ALBUMIN 2.2* 2.2*   No results for input(s): "LIPASE", "AMYLASE" in the last 168 hours. Recent Labs  Lab 09/12/23 1259  AMMONIA 19   Coagulation Profile: No results for input(s): "INR", "PROTIME" in the last 168 hours. Cardiac  Enzymes: No results for input(s): "CKTOTAL", "CKMB", "CKMBINDEX", "TROPONINI" in the last 168 hours. BNP (last 3 results) No results for input(s): "PROBNP" in the last 8760 hours. HbA1C: No results for input(s): "HGBA1C" in the last 72 hours. CBG: No results for input(s): "GLUCAP" in the last 168 hours. Lipid Profile: No results for input(s): "CHOL", "HDL", "LDLCALC", "TRIG", "CHOLHDL", "LDLDIRECT" in the last 72 hours. Thyroid Function Tests: No results for input(s): "TSH", "T4TOTAL", "FREET4", "T3FREE", "THYROIDAB" in the last 72 hours. Anemia Panel: No results for input(s): "VITAMINB12", "FOLATE", "FERRITIN", "TIBC", "IRON", "RETICCTPCT" in the last 72 hours. Sepsis Labs: No results for input(s): "PROCALCITON", "LATICACIDVEN" in the last 168 hours.  Recent Results (from the past 240 hours)  Surgical PCR screen     Status: None   Collection Time: 09/12/23  3:34 AM   Specimen: Nasal Mucosa; Nasal Swab  Result Value Ref Range Status   MRSA, PCR NEGATIVE NEGATIVE Final   Staphylococcus aureus NEGATIVE NEGATIVE Final    Comment: (NOTE) The Xpert SA Assay (FDA approved for NASAL specimens in patients  60 years of age and older), is one component of a comprehensive surveillance program. It is not intended to diagnose infection nor to guide or monitor treatment. Performed at [facility], [address] [place], [place] [address]       Radiology Studies: No results found.    Scheduled Meds:  ascorbic acid  250 mg Oral BID   cyanocobalamin  1,000 mcg Subcutaneous Daily   enoxaparin (LOVENOX) injection  40 mg Subcutaneous Q24H   feeding supplement  237 mL Oral BID BM   mupirocin ointment  1 Application Nasal BID   rosuvastatin  20 mg Oral Daily   zinc sulfate (50mg  elemental zinc)  220 mg Oral Daily   Continuous Infusions:   LOS: 5 days   Time spent: 35 minutes   Noralee Stain, DO [facility] 09/16/2023, 1:32 PM   Available via Epic secure chat 7am-7pm After these hours, please refer to coverage provider listed on [email or web address]

## 2023-09-16 NOTE — Progress Notes (Signed)
Physical Therapy Treatment Patient Details Name: Jack [name] MRN: [id number] DOB: 1945-11-11 Today's Date: 09/16/2023   History of Present Illness 78 y.o. male presents to [facility] from [facility] 09/11/23 for vascular surgery consult/intervention for B LE. Pt admitted w/ deep B heel ulcers w/ eschar. LE angiogram scheduled for 1/24. Palliative following. PMHx: HTN, CA s/p CABG, HLD   PT Comments  Pt in bed upon arrival and agreeable to limited PT session. Pt reported having long discussions with family and MD about prognosis/potential options and was feeling fatigued. Pt declined sitting EOB, however, was agreeable to move into long-sitting. Pt used B rails to pull self into sitting and would occasionally hold onto rails for balance. Pt was agreeable to minimal LE exercises while in bed with no increase in pain. Educated pt on importance of continued mobility with pt agreement to work more with therapy in future sessions. Pt is progressing slowly towards goals. Acute PT to follow.      If plan is discharge home, recommend the following: A lot of help with walking and/or transfers;A lot of help with bathing/dressing/bathroom;Assistance with cooking/housework;Assist for transportation;Help with stairs or ramp for entrance   Can travel by private vehicle     No  Equipment Recommendations  Other (comment) (TBD at next venue)       Precautions / Restrictions Precautions Precautions: Fall Precaution Comments: B LE wounds, eschar on B heels Required Braces or Orthoses: Other Brace Other Brace: prevalon boots; float heels Restrictions Weight Bearing Restrictions Per Provider Order: No     Mobility  Bed Mobility Overal bed mobility: Needs Assistance Bed Mobility: Supine to Sit (supine to long-sitting)     Supine to sit: Used rails, Contact guard     General bed mobility comments: pt declined sitting EOB, was agreeable to move into long sitting using B rails and MinA x2 for safety for trunk  elevation. Pt able to scoot towards HOB in supine with BUE on rails and MinAx2 with bed pad    Transfers    General transfer comment: deferred      Balance Overall balance assessment: Needs assistance Sitting-balance support: Bilateral upper extremity supported Sitting balance-Leahy Scale: Fair Sitting balance - Comments: needed UE support when long-sitting in bed      Cognition Arousal: Alert Behavior During Therapy: WFL for tasks assessed/performed Overall Cognitive Status: No family/caregiver present to determine baseline cognitive functioning  General Comments: follows single step commands       Exercises General Exercises - Lower Extremity Ankle Circles/Pumps: AROM, Both, 5 reps, Supine Quad Sets: AROM, Both, 5 reps, Supine Heel Slides: AROM, Both, 5 reps, Supine Other Exercises Other Exercises: postural control in long-sitting with intermittent B UE support, ~5 minutes    General Comments General comments (skin integrity, edema, etc.): VSS on RA      Pertinent Vitals/Pain Pain Assessment Pain Assessment: No/denies pain     PT Goals (current goals can now be found in the care plan section) Acute Rehab PT Goals PT Goal Formulation: Patient unable to participate in goal setting Time For Goal Achievement: 09/27/23 Potential to Achieve Goals: Good Progress towards PT goals: Progressing toward goals    Frequency    Min 1X/week       AM-PAC PT "6 Clicks" Mobility   Outcome Measure  Help needed turning from your back to your side while in a flat bed without using bedrails?: Total Help needed moving from lying on your back to sitting on the side of a flat  bed without using bedrails?: Total Help needed moving to and from a bed to a chair (including a wheelchair)?: Total Help needed standing up from a chair using your arms (e.g., wheelchair or bedside chair)?: Total Help needed to walk in hospital room?: Total Help needed climbing 3-5 steps with a railing? :  Total 6 Click Score: 6    End of Session   Activity Tolerance: Patient limited by fatigue Patient left: in bed;with call bell/phone within reach;with bed alarm set Nurse Communication: Mobility status PT Visit Diagnosis: Other abnormalities of gait and mobility (R26.89);Muscle weakness (generalized) (M62.81);Difficulty in walking, not elsewhere classified (R26.2);Adult, failure to thrive (R62.7)     Time: 1610-9604 PT Time Calculation (min) (ACUTE ONLY): 16 min  Charges:    $Therapeutic Exercise: 8-22 mins PT General Charges $$ ACUTE PT VISIT: 1 Visit                     Hilton Cork, PT, DPT Secure Chat Preferred  Rehab Office [phone]   Arturo Morton Brion Aliment 09/16/2023, 4:11 PM

## 2023-09-16 NOTE — Progress Notes (Signed)
Palliative:  Palliative shadowing for needs. TOC working on discharge to nursing facility with hospice to follow. Titrate medications as needed to ensure comfort. Recommend PRN oxycodone, ativan, haldol sublingual to be provided at time of discharge.   No charge  Yong Channel, NP Palliative Medicine Team Pager [phone] (Please see [email or web address] for schedule) Team Phone [phone]

## 2023-09-16 NOTE — Progress Notes (Signed)
[facility]   Subjective  -patient more alert   Objective 111/73 88 98.2 F (36.8 C) (Oral) 17 95%  Intake/Output Summary (Last 24 hours) at 09/16/2023 1405 Last data filed at 09/16/2023 0532 Gross per 24 hour  Intake --  Output 701 ml  Net -701 ml           Laboratory Lab Results: Recent Labs    09/16/23 0550  WBC 9.3  HGB 11.4*  HCT 35.4*  PLT 329   BMET Recent Labs    09/16/23 0550  NA 132*  K 4.5  CL 99  CO2 25  GLUCOSE 107*  BUN 15  CREATININE 1.02  CALCIUM 8.2*    COAG Lab Results  Component Value Date   INR 1.06 01/23/2018   No results found for: "PTT"  Assessment/Planning:  78 year old male that vascular was consulted for extensive bilateral heel ulceration with tissue necrosis.  I had a long meeting with the sons at bedside including with palliative care.  Patient told me several days ago that he had not walked in 6 months and spent all his time in bed.  These look like pressure wounds on his heels in a patient that is non-ambulatory.  Discussed if nonambulatory above-knee amputations would be the best way forward.  The sons state he is ambulatory and gets out of bed and sits in a chair.  They would like to see if there are other options to try and pursue limb salvage.  Discussed with the extent of necrosis I do not have any realistic expectation that he would ever heal his wounds even with pulsatile inflow given his deconditioned state.  They really want to avoid amputations.  Ultimately we agreed on angiogram to see what options he has for revascularization.  Will schedule for Friday in the Cath Lab.  May be reasonable to pursue MRI of the feet to ensure he does not have any osteomyelitis in the heel that would further complicate his long-term picture.  [name] 09/16/2023 2:05 PM --

## 2023-09-17 DIAGNOSIS — I70235 Atherosclerosis of native arteries of right leg with ulceration of other part of foot: Secondary | ICD-10-CM | POA: Diagnosis not present

## 2023-09-17 DIAGNOSIS — L97519 Non-pressure chronic ulcer of other part of right foot with unspecified severity: Secondary | ICD-10-CM | POA: Diagnosis not present

## 2023-09-17 NOTE — Progress Notes (Signed)
PROGRESS NOTE    [name]  RUE:[id number] DOB: Jun 29, 1946 DOA: 09/11/2023 PCP: Toma Deiters, MD     Brief Narrative:  Jack [name] is a 78 year old with a history of HTN, CAD status post CABG, and HLD who admits to not taking his usual medications or caring for himself appropriately since the death of his wife in 10/05/2015.  He was admitted to [facility] 09/03/2023 for evaluation of what was felt to be bilateral lower extremity cellulitis with ulcerations.  He underwent I&D at that facility and was treated with empiric antibiotics.  Wound cultures grew MRSA sensitive to doxycycline.  ABIs of the right leg were noted to be significantly diminished and it was felt the patient would likely require vascular intervention for wound healing.  Attempts were made to transfer him starting 1/13 but due to limited bed availability he did not arrive at [facility] until late night 09/11/2023.  On his arrival at [facility] the patient has noted to have extensive wounds to both lower extremities but with no evidence of acute infection at this time.  Most prominent are deep bilateral heel ulcers with a thick overlying eschar.  New events last 24 hours / Subjective: No new complaints today.  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 (Active)  09/11/23 2350  Location: Sacrum  Location Orientation: Anterior;Mid  Staging: Stage 1 -  Intact skin with non-blanchable redness of a localized area usually over a bony prominence.  Wound Description (Comments):   Present on Admission: Yes     Nutrition Problem: Moderate Malnutrition Etiology: chronic illness, social / environmental circumstances   DVT prophylaxis:  enoxaparin (LOVENOX) injection 40 mg Start: 09/12/23 1600  Code Status: DNR Family Communication: None at bedside this morning Disposition Plan: TBD Status is: Inpatient Remains inpatient appropriate because: Angiogram 1/24   Antimicrobials:  Anti-infectives (From admission, onward)    Start     Dose/Rate Route Frequency Ordered Stop   09/12/23 0400  Vancomycin (VANCOCIN) 1,500 mg in sodium chloride 0.9 % 500 mL IVPB  Status:  Discontinued        1,500 mg 250 mL/hr over 120 Minutes  Intravenous Every 24 hours 09/12/23 0244 09/12/23 1801   09/12/23 0300  vancomycin (VANCOREADY) IVPB 2000 mg/400 mL  Status:  Discontinued        2,000 mg 200 mL/hr over 120 Minutes Intravenous  Once 09/12/23 0142 09/12/23 0218   09/12/23 0200  cefTRIAXone (ROCEPHIN) 2 g in sodium chloride 0.9 % 100 mL IVPB  Status:  Discontinued        2 g 200 mL/hr over  30 Minutes Intravenous Every 24 hours 09/12/23 0127 09/12/23 1801   09/12/23 0200  metroNIDAZOLE (FLAGYL) IVPB 500 mg  Status:  Discontinued        500 mg 100 mL/hr over 60 Minutes Intravenous Every 12 hours 09/12/23 0127 09/12/23 1801        Objective: Vitals:   09/16/23 1614 09/16/23 2100 09/17/23 0400 09/17/23 0755  BP: (!) 106/56 (!) 144/73 127/64 134/73  Pulse: 95 97 94 94  Resp:  18 18 17   Temp: 98.8 F (37.1 C) 98.9 F (37.2 C) 98.9 F (37.2 C) 98.8 F (37.1 C)  TempSrc: Oral Oral Oral Oral  SpO2: 95% 97% 95% 93%  Weight:      Height:       No intake or output data in the 24 hours ending 09/17/23 1205  Filed Weights   09/12/23 0044  Weight: 91.6 kg    Examination:  General exam: Appears calm and comfortable  Respiratory system: Clear to auscultation. Respiratory effort normal. No respiratory distress. No conversational dyspnea.  Cardiovascular system: S1 & S2 heard, RRR. No murmurs.  Gastrointestinal system: Abdomen is nondistended, soft and nontender. Normal bowel sounds heard. Central nervous system: Alert and oriented. No focal neurological deficits. Speech clear.  Extremities: Symmetric in appearance  Skin: + Bilateral lower extremities and dry and clean dressing Psychiatry: Judgement and insight appear normal. Mood & affect appropriate.   Data Reviewed: I have personally reviewed following labs and imaging studies  CBC: Recent Labs  Lab 09/12/23 0128 09/13/23 0459 09/16/23 0550  WBC 11.2* 10.5 9.3  NEUTROABS 8.4*  --   --   HGB 11.6* 12.3* 11.4*  HCT 36.3* 38.0* 35.4*  MCV 99.5 97.7 98.1  PLT 206 194 329   Basic Metabolic Panel: Recent Labs  Lab 09/12/23 0128 09/13/23 0459 09/16/23 0550  NA 136 138 132*  K 4.3 4.4 4.5  CL 99 98 99  CO2 28 28 25   GLUCOSE 114* 101* 107*  BUN 26* 24* 15  CREATININE 1.23 1.13 1.02  CALCIUM 8.1* 8.5* 8.2*  MG 2.3 2.3  --   PHOS 2.9  --   --    GFR: Estimated Creatinine Clearance: 69 mL/min (by C-G  formula based on SCr of 1.02 mg/dL). Liver Function Tests: Recent Labs  Lab 09/12/23 0128 09/13/23 0459  AST 73* 51*  ALT 59* 52*  ALKPHOS 94 86  BILITOT 0.9 0.8  PROT 5.9* 6.2*  ALBUMIN 2.2* 2.2*   No results for input(s): "LIPASE", "AMYLASE" in the last 168 hours. Recent Labs  Lab 09/12/23 1259  AMMONIA 19   Coagulation Profile: No results for input(s): "INR", "PROTIME" in the last 168 hours. Cardiac Enzymes: No results for input(s): "CKTOTAL", "CKMB", "CKMBINDEX", "TROPONINI" in the last 168 hours. BNP (last 3 results) No results for input(s): "PROBNP" in the last 8760 hours. HbA1C: No results for input(s): "HGBA1C" in the last 72 hours. CBG: No results for input(s): "GLUCAP" in the last 168 hours. Lipid Profile: No results for input(s): "CHOL", "HDL", "LDLCALC", "TRIG", "CHOLHDL", "LDLDIRECT"  in the last 72 hours. Thyroid Function Tests: No results for input(s): "TSH", "T4TOTAL", "FREET4", "T3FREE", "THYROIDAB" in the last 72 hours. Anemia Panel: No results for input(s): "VITAMINB12", "FOLATE", "FERRITIN", "TIBC", "IRON", "RETICCTPCT" in the last 72 hours. Sepsis Labs: No results for input(s): "PROCALCITON", "LATICACIDVEN" in the last 168 hours.  Recent Results (from the past 240 hours)  Surgical PCR screen     Status: None   Collection Time: 09/12/23  3:34 AM   Specimen: Nasal Mucosa; Nasal Swab  Result Value Ref Range Status   MRSA, PCR NEGATIVE NEGATIVE Final   Staphylococcus aureus NEGATIVE NEGATIVE Final    Comment: (NOTE) The Xpert SA Assay (FDA approved for NASAL specimens in patients 4 years of age and older), is one component of a comprehensive surveillance program. It is not intended to diagnose infection nor to guide or monitor treatment. Performed at [facility], [address], [place], [place] [address]       Radiology Studies: No results found.    Scheduled Meds:  ascorbic acid  250 mg Oral BID   cyanocobalamin  1,000 mcg  Subcutaneous Daily   enoxaparin (LOVENOX) injection  40 mg Subcutaneous Q24H   feeding supplement  237 mL Oral BID BM   rosuvastatin  20 mg Oral Daily   zinc sulfate (50mg  elemental zinc)  220 mg Oral Daily   Continuous Infusions:   LOS: 6 days   Time spent: 25 minutes   Noralee Stain, DO [facility] 09/17/2023, 12:05 PM   Available via Epic secure chat 7am-7pm After these hours, please refer to coverage provider listed on [email or web address]

## 2023-09-17 NOTE — Progress Notes (Addendum)
Physical Therapy Treatment Patient Details Name: Jack [name] MRN: [id number] DOB: Dec 28, 1945 Today's Date: 09/17/2023   History of Present Illness 78 y.o. male presents to [facility] from [facility] 09/11/23 for vascular surgery consult/intervention for B LE. Pt admitted w/ deep B heel ulcers w/ eschar. LE angiogram scheduled for 1/24. Palliative following. PMHx: HTN, CA s/p CABG, HLD.    PT Comments  Pt received in supine, agreeable to therapy session with encouragement, pt c/o fatigue and A&O x3 but with poor insight into deficits and per pt/nursing staff, he does not understand how to/when to use call bell for self-care needs. Pt limited due to fatigue and episodes of multiple loose stools and need for bed pan, but able to work on seated balance at edge of bed and lateral scooting along EOB toward [facility] this date with up to modA assist prior to return to supine for bed pan. Prevalon boots donned during session, pt will need reinforcement on keeping them on. RN/MD notified of pt loose stools and RN notified that pt would benefit from air bed due to limited mobility and possibly soft touch call bell due to cognitive deficit/decreased understanding of call bell use.     If plan is discharge home, recommend the following: A lot of help with bathing/dressing/bathroom;Assistance with cooking/housework;Assist for transportation;Help with stairs or ramp for entrance;Two people to help with walking and/or transfers;Supervision due to cognitive status;Direct supervision/assist for financial management;Direct supervision/assist for medications management   Can travel by private vehicle     No  Equipment Recommendations  Other (comment) (TBD at next venue, currently pt at hoyer/wc level)    Recommendations for Other Services       Precautions / Restrictions Precautions Precautions: Fall Precaution Comments: Contact precs; B LE wounds, eschar on B heels, frequent loose stools Required Braces or Orthoses: Other  Brace Other Brace: prevalon boots; float heels Restrictions Weight Bearing Restrictions Per Provider Order: No Other Position/Activity Restrictions: still full WB allowed per Dr Alvino Chapel 1/23     Mobility  Bed Mobility Overal bed mobility: Needs Assistance Bed Mobility: Rolling, Sidelying to Sit, Sit to Sidelying (supine to long-sitting) Rolling: Min assist, Used rails Sidelying to sit: Min assist, +2 for safety/equipment, HOB elevated, Used rails     Sit to sidelying: Mod assist, Used rails General bed mobility comments: Cues for self-assist and pt slow to initiate but follows multimodal cues with increased time. Pt sat EOB ~5 mins while MS and PTA checked BP and worked on having him perform lateral scooting toward HOB. Pt observed to be in soiled bed once sitting EOB, but also reports need for bathroom, so pt assisted on to bed pan once returned to supine.    Transfers Overall transfer level: Needs assistance   Transfers: Bed to chair/wheelchair/BSC            Lateral/Scoot Transfers: Mod assist General transfer comment: Lateral scooting toward HOB on his R side with modA bed pad assist, x3 small propulsion scoots using BUE and light use of BLE    Ambulation/Gait               General Gait Details: defer, pt with active BM during session and once on bed pan, RN/NT notified and aware.   Stairs             Wheelchair Mobility     Tilt Bed    Modified Rankin (Stroke Patients Only)       Balance Overall balance assessment: Needs assistance Sitting-balance support:  Bilateral upper extremity supported, Single extremity supported, Feet supported Sitting balance-Leahy Scale: Fair Sitting balance - Comments: pt tending to use BUE support and c/o fatigue supine and sitting Postural control:  (anterior lean sitting EOB but using BUE appropriately for support)     Standing balance comment: defer due to incontinence and pt fatigue                             Cognition Arousal: Alert Behavior During Therapy: WFL for tasks assessed/performed Overall Cognitive Status: No family/caregiver present to determine baseline cognitive functioning                                 General Comments: follows single step commands, pt oriented to month, year and day of week, he is one day off on calendar date (states 22nd), pt oriented to location however does not understand how to use call bell and lying in soiled bed without notifying staff; per RN he has not been notifying staff for any care needs. He may benefit from soft touch call bell due to cognitive deficit, RN notified.     Vital Signs  Patient Position (if appropriate) Orthostatic Vitals  Orthostatic Lying   BP- Lying 131/61  Pulse- Lying 87  Orthostatic Sitting  BP- Sitting 117/78      Exercises General Exercises - Lower Extremity Ankle Circles/Pumps: AROM, Both, 5 reps, Supine Heel Slides: AROM, Both, 5 reps, Supine Straight Leg Raises: AAROM, Both, 5 reps, Supine Other Exercises Other Exercises: static sitting with emphasis on upright posture x5 mins    General Comments General comments (skin integrity, edema, etc.): see BP in comments above; SpO2/HR WFL on RA throughout; RN notified he would benefit from air mattress to protect his skin      Pertinent Vitals/Pain Pain Assessment Pain Assessment: Faces Faces Pain Scale: Hurts little more Pain Location: generalized, heels and bottom, also back itching Pain Descriptors / Indicators: Grimacing, Guarding Pain Intervention(s): Limited activity within patient's tolerance, Monitored during session    Home Living                          Prior Function            PT Goals (current goals can now be found in the care plan section) Acute Rehab PT Goals Patient Stated Goal: Did not specifically state; agrees to get up to EOB PT Goal Formulation: Patient unable to participate in goal setting Time For Goal  Achievement: 09/27/23 Progress towards PT goals: Progressing toward goals    Frequency    Min 1X/week      PT Plan      Co-evaluation              AM-PAC PT "6 Clicks" Mobility   Outcome Measure  Help needed turning from your back to your side while in a flat bed without using bedrails?: A Lot (from flat bed w/o rails) Help needed moving from lying on your back to sitting on the side of a flat bed without using bedrails?: A Lot Help needed moving to and from a bed to a chair (including a wheelchair)?: Total Help needed standing up from a chair using your arms (e.g., wheelchair or bedside chair)?: Total Help needed to walk in hospital room?: Total Help needed climbing 3-5 steps with a railing? : Total 6  Click Score: 8    End of Session Equipment Utilized During Treatment: Other (comment) (bed pads) Activity Tolerance: Patient limited by fatigue;Treatment limited secondary to medical complications (Comment);Other (comment) (frequent loose stools, RN/MD notified) Patient left: in bed;with call bell/phone within reach;with bed alarm set;Other (comment) (Prevalon boots donned in supine) Nurse Communication: Mobility status;Need for lift equipment;Other (comment) (pt on bed pan, cleaned up slightly but needs to be cleaned up more once done with BM; needs air mattress (RN reports it was ordered after discussion)) PT Visit Diagnosis: Other abnormalities of gait and mobility (R26.89);Muscle weakness (generalized) (M62.81);Difficulty in walking, not elsewhere classified (R26.2);Adult, failure to thrive (R62.7)     Time: 0272-5366 PT Time Calculation (min) (ACUTE ONLY): 24 min  Charges:    $Therapeutic Exercise: 8-22 mins $Therapeutic Activity: 8-22 mins PT General Charges $$ ACUTE PT VISIT: 1 Visit                     Jack [name] P., PTA Acute Rehabilitation Services Secure Chat Preferred 9a-5:30pm Office: [phone]    Angus [place] 09/17/2023, 3:52 PM

## 2023-09-17 NOTE — Progress Notes (Addendum)
  Progress Note    09/17/2023 8:19 AM * No surgery found *  Subjective:  no complaints. Agreeable to angiogram tomorrow    Vitals:   09/17/23 0400 09/17/23 0755  BP: 127/64 134/73  Pulse: 94 94  Resp: 18 17  Temp: 98.9 F (37.2 C) 98.8 F (37.1 C)  SpO2: 95% 93%    Physical Exam: General:  laying comfortably in bed, alert Extremities:  bilateral heel and foot wounds currently dry  CBC    Component Value Date/Time   WBC 9.3 09/16/2023 0550   RBC 3.61 (L) 09/16/2023 0550   HGB 11.4 (L) 09/16/2023 0550   HCT 35.4 (L) 09/16/2023 0550   PLT 329 09/16/2023 0550   MCV 98.1 09/16/2023 0550   MCH 31.6 09/16/2023 0550   MCHC 32.2 09/16/2023 0550   RDW 14.9 09/16/2023 0550   LYMPHSABS 1.9 09/12/2023 0128   MONOABS 0.7 09/12/2023 0128   EOSABS 0.1 09/12/2023 0128   BASOSABS 0.1 09/12/2023 0128    BMET    Component Value Date/Time   NA 132 (L) 09/16/2023 0550   K 4.5 09/16/2023 0550   CL 99 09/16/2023 0550   CO2 25 09/16/2023 0550   GLUCOSE 107 (H) 09/16/2023 0550   BUN 15 09/16/2023 0550   CREATININE 1.02 09/16/2023 0550   CREATININE 1.04 08/07/2017 1012   CALCIUM 8.2 (L) 09/16/2023 0550   GFRNONAA >60 09/16/2023 0550   GFRAA >60 01/23/2018 0856    INR    Component Value Date/Time   INR 1.06 01/23/2018 0856    No intake or output data in the 24 hours ending 09/17/23 0819    Assessment/Plan:  78 y.o. male is with BLE wounds   -After discussion with the patient and family, they are opting for angiogram at this time. They would like to avoid amputation for now -Will plan for abdominal aortogram with BLE runoff and possible intervention with Dr.Loghan Subia tomorrow -Will make NPO at midnight and place consent orders   Loel Dubonnet, PA-C [facility] [phone] 09/17/2023 8:19 AM   I have seen and evaluated the patient. I agree with the PA note as documented above.  Plan aortogram with bilateral lower extremity arteriogram tomorrow for  bilateral heel wounds.  ABIs 0.3 bilaterally.  I had a very frank discussion with the sons yesterday that I have no expectation that these will ever heal.  He looks to me to be fairly debilitated and immobile.  They claim that he is able to stand and walk and sit in a chair at home.  Will have more information regarding options pending angiogram tomorrow.  [name], MD [facility] [place] Office: [phone]

## 2023-09-17 NOTE — Plan of Care (Signed)

## 2023-09-18 ENCOUNTER — Ambulatory Visit (HOSPITAL_COMMUNITY): Admission: RE | Admit: 2023-09-18 | Payer: Medicare HMO | Source: Home / Self Care | Admitting: Vascular Surgery

## 2023-09-18 ENCOUNTER — Inpatient Hospital Stay (HOSPITAL_COMMUNITY): Payer: Medicare HMO

## 2023-09-18 ENCOUNTER — Encounter (HOSPITAL_COMMUNITY)
Admission: RE | Disposition: A | Discharge status: Skilled Nursing Facility | Payer: Self-pay | Attending: Internal Medicine

## 2023-09-18 DIAGNOSIS — I70235 Atherosclerosis of native arteries of right leg with ulceration of other part of foot: Secondary | ICD-10-CM | POA: Diagnosis not present

## 2023-09-18 DIAGNOSIS — L97519 Non-pressure chronic ulcer of other part of right foot with unspecified severity: Secondary | ICD-10-CM | POA: Diagnosis not present

## 2023-09-18 DIAGNOSIS — I7090 Unspecified atherosclerosis: Secondary | ICD-10-CM

## 2023-09-18 DIAGNOSIS — I70244 Atherosclerosis of native arteries of left leg with ulceration of heel and midfoot: Secondary | ICD-10-CM | POA: Diagnosis not present

## 2023-09-18 DIAGNOSIS — I70234 Atherosclerosis of native arteries of right leg with ulceration of heel and midfoot: Secondary | ICD-10-CM

## 2023-09-18 HISTORY — PX: ABDOMINAL AORTOGRAM W/LOWER EXTREMITY: CATH118223

## 2023-09-18 SURGERY — ABDOMINAL AORTOGRAM W/LOWER EXTREMITY
Anesthesia: LOCAL | Laterality: Bilateral

## 2023-09-18 MED ORDER — LIDOCAINE HCL (PF) 1 % IJ SOLN
INTRAMUSCULAR | Status: AC
  Filled 2023-09-18: qty 30

## 2023-09-18 MED ORDER — HYDRALAZINE HCL 20 MG/ML IJ SOLN: 5.0000 mg | INTRAMUSCULAR | Status: DC | PRN

## 2023-09-18 MED ORDER — HEPARIN (PORCINE) IN NACL 1000-0.9 UT/500ML-% IV SOLN
INTRAVENOUS | Status: DC | PRN
  Administered 2023-09-18 (×2): 500 mL

## 2023-09-18 MED ORDER — SODIUM CHLORIDE 0.9% FLUSH: 3.0000 mL | INTRAVENOUS | Status: DC | PRN

## 2023-09-18 MED ORDER — LIDOCAINE HCL (PF) 1 % IJ SOLN
INTRAMUSCULAR | Status: DC | PRN
  Administered 2023-09-18: 15 mL

## 2023-09-18 MED ORDER — SODIUM CHLORIDE 0.9% FLUSH
3.0000 mL | Freq: Two times a day (BID) | INTRAVENOUS | Status: DC
  Administered 2023-09-18 – 2023-10-07 (×36): 3 mL via INTRAVENOUS

## 2023-09-18 MED ORDER — LABETALOL HCL 5 MG/ML IV SOLN: 10.0000 mg | INTRAVENOUS | Status: DC | PRN

## 2023-09-18 MED ORDER — ACETAMINOPHEN 325 MG PO TABS
650.0000 mg | ORAL_TABLET | ORAL | Status: DC | PRN
  Administered 2023-09-23 – 2023-09-29 (×2): 650 mg via ORAL

## 2023-09-18 MED ORDER — SODIUM CHLORIDE 0.9 % IV SOLN: 250.0000 mL | INTRAVENOUS | Status: AC | PRN

## 2023-09-18 MED ORDER — ASPIRIN 81 MG PO TBEC
81.0000 mg | DELAYED_RELEASE_TABLET | Freq: Every day | ORAL | Status: DC
  Administered 2023-09-18 – 2023-10-07 (×20): 81 mg via ORAL
  Filled 2023-09-18 (×20): qty 1

## 2023-09-18 MED ORDER — IODIXANOL 320 MG/ML IV SOLN
INTRAVENOUS | Status: DC | PRN
  Administered 2023-09-18: 68 mL

## 2023-09-18 SURGICAL SUPPLY — 16 items
CATH NAVICROSS ST 65CM (CATHETERS) IMPLANT
CATH OMNI FLUSH 5F 65CM (CATHETERS) IMPLANT
CATHETER NAVICROSS ST 65CM (CATHETERS) ×1 IMPLANT
COVER DOME SNAP 22 D (MISCELLANEOUS) IMPLANT
DEVICE TORQUE .025-.038 (MISCELLANEOUS) IMPLANT
GUIDEWIRE ANGLED .035X150CM (WIRE) IMPLANT
KIT MICROPUNCTURE NIT STIFF (SHEATH) IMPLANT
KIT SINGLE USE MANIFOLD (KITS) IMPLANT
KIT SYRINGE INJ CVI SPIKEX1 (MISCELLANEOUS) IMPLANT
PROTECTION STATION PRESSURIZED (MISCELLANEOUS) ×1 IMPLANT
SET ATX-X65L (MISCELLANEOUS) IMPLANT
SHEATH PINNACLE 5F 10CM (SHEATH) IMPLANT
SHEATH PROBE COVER 6X72 (BAG) IMPLANT
STATION PROTECTION PRESSURIZED (MISCELLANEOUS) IMPLANT
TRAY PV CATH (CUSTOM PROCEDURE TRAY) ×1 IMPLANT
WIRE BENTSON .035X145CM (WIRE) IMPLANT

## 2023-09-18 NOTE — Op Note (Addendum)
Patient name: Jack [name] MRN: [id number] DOB: 1945/11/30 Sex: male  09/18/2023 Pre-operative Diagnosis: Critical limb ischemia of bilateral lower extremities with bilateral heel wounds Post-operative diagnosis:  Same Surgeon:  [name], MD Procedure Performed: 1.  Ultrasound-guided access right common femoral artery 2.  Aortogram with catheter selection of abdominal aorta 3.  Left lower extremity arteriogram with catheter selection of the left common femoral 4.  Right lower extremity arteriogram with catheter runoff from the right common femoral sheath  Indications: 78 year old male transferred from [facility] for evaluation of bilateral heel ulcers.  Previously discussed with family that I think he needs bilateral above-knee amputation versus palliative wound care.  He presents today for aortogram, lower extremity arteriogram after risk benefits discussed.  Findings:   Ultrasound-guided access right common femoral artery.  Diseased abdominal aorta with small aneurysm.    On the left he has a high-grade iliac stenosis at the junction of the common iliac and external iliac artery over 80%.  The left common femoral has a high-grade stenosis >80% and is calcified.  The profundus patent.  He has a flush SFA occlusion.  The SFA as well as the above and below-knee popliteal artery are all occluded.  He reconstitutes a tibial trifurcation.  AT is the dominant runoff into the foot and is widely patent.  The posterior tibial is also patent but has a high-grade stenosis in the distal calf.  On the right has significantly calcified iliac arteries.  He also has significant common femoral disease >60% with a patent profunda and flush SFA occlusion.  SFA is occluded with occluded above and below-knee popliteal artery.  AT reconstitutes and is the dominant runoff into the foot.   Procedure:  The patient was identified in the holding area and taken to room 8.  The patient was then placed  supine on the table and prepped and draped in the usual sterile fashion.  A time out was called.  Ultrasound was used to evaluate the right common femoral artery.  It was patent .  A digital ultrasound image was acquired.  A micropuncture needle was used to access the right common femoral artery under ultrasound guidance.  An 018 wire was advanced without resistance and a micropuncture sheath was placed.  The 018 wire was removed and a benson wire was placed.  The micropuncture sheath was exchanged for a 5 french sheath.  An omniflush catheter was advanced over the wire to the level of L-1.  An abdominal angiogram was obtained.  I then went across the aortic bifurcation with an Omni Flush catheter and the wire would not advance.  I changed to a glidewire and crossing catheter..  The catheter was placed into theleft common femoral artery and left runoff was obtained.  We then removed wires and catheters and got right leg runoff from the sheath in the right groin.  Pertinent findings are noted above.  He has no endovascular options.  Plan: Bilateral flush SFA occlusion with common femoral disease and no endovascular option.  I reviewed his case with several of my partners.  Ultimately I think he needs a left above-knee amputation as the left heel eschar is extensive and I do not think the foot is salvageable.  If there is a foot to save it would be the right foot and this would require common femoral endarterectomy with femoral to anterior tibial bypass.  Even this would be high risk for no wound healing long-term.  Have discussed getting  foot MRI to ensure no osteomyelitis in the heel.   [name], MD [facility] [place] Office: [phone]

## 2023-09-18 NOTE — Progress Notes (Addendum)
  Progress Note    09/18/2023 8:17 AM * No surgery found *  Subjective:  no complaints    Vitals:   09/18/23 0653 09/18/23 0806  BP: (!) 148/70 (!) 131/97  Pulse: 95 93  Resp: 17 16  Temp: 98.5 F (36.9 C) 98.4 F (36.9 C)  SpO2: 92% 98%    Physical Exam: General:  laying in bed, resting comfortably Lungs:  nonlabored Extremities:  BLE wounds bandaged and dry  CBC    Component Value Date/Time   WBC 9.3 09/16/2023 0550   RBC 3.61 (L) 09/16/2023 0550   HGB 11.4 (L) 09/16/2023 0550   HCT 35.4 (L) 09/16/2023 0550   PLT 329 09/16/2023 0550   MCV 98.1 09/16/2023 0550   MCH 31.6 09/16/2023 0550   MCHC 32.2 09/16/2023 0550   RDW 14.9 09/16/2023 0550   LYMPHSABS 1.9 09/12/2023 0128   MONOABS 0.7 09/12/2023 0128   EOSABS 0.1 09/12/2023 0128   BASOSABS 0.1 09/12/2023 0128    BMET    Component Value Date/Time   NA 132 (L) 09/16/2023 0550   K 4.5 09/16/2023 0550   CL 99 09/16/2023 0550   CO2 25 09/16/2023 0550   GLUCOSE 107 (H) 09/16/2023 0550   BUN 15 09/16/2023 0550   CREATININE 1.02 09/16/2023 0550   CREATININE 1.04 08/07/2017 1012   CALCIUM 8.2 (L) 09/16/2023 0550   GFRNONAA >60 09/16/2023 0550   GFRAA >60 01/23/2018 0856    INR    Component Value Date/Time   INR 1.06 01/23/2018 0856     Intake/Output Summary (Last 24 hours) at 09/18/2023 0817 Last data filed at 09/18/2023 0000 Gross per 24 hour  Intake --  Output 1000 ml  Net -1000 ml      Assessment/Plan:  78 y.o. male with tissue loss of bilateral lower extremities    -Says he feels fine this morning. Denies any pain in his legs -Continued tissue loss of bilateral lower extremities. Wounds are dry and wrapped  -Plan is for bilateral lower extremity angiogram today with Dr.Roldan Laforest. The patient remains agreeable and all questions have been answered. -He has been NPO past midnight and consent orders have been placed.    Loel Dubonnet, PA-C [facility] [phone] 09/18/2023 8:17 AM   I have seen and evaluated the patient. I agree with the PA note as documented above.  Plan bilateral lower extremity arteriogram from a right femoral approach.  Will see if he has any endovascular options and/or bypass targets.  I had a long discussion with the family earlier this week with both sons and I think he should consider palliative wound care versus above-knee amputations.  Family would like to see what other options he has.  They maintain he is ambulatory at home although he looks quite frail here.  I have not seen him out of bed.  Bilateral heel ulcers suggest mobility problems.  [name], MD [facility] [place] Office: [phone]

## 2023-09-18 NOTE — Progress Notes (Addendum)
PROGRESS NOTE    [facility] [name]  ZOX:[id number] DOB: 1945/10/31 DOA: 09/11/2023 PCP: Jack Deiters, MD     Brief Narrative:  Jack [name] is a 78 year old with a history of HTN, CAD status post CABG, and HLD who admits to not taking his usual medications or caring for himself appropriately since the death of his wife in 2015-10-20.  He was admitted to [name] 09[facility]03/20[facility] for evaluation of what was felt to be bilateral lower extremity cellulitis with ulcerations.  He underwent I&D at that facility and was treated with empiric antibiotics.  Wound cultures grew MRSA sensitive to doxycycline.  ABIs of the right leg were noted to be significantly diminished and it was felt the patient would likely require vascular intervention for wound healing.  Attempts were made to transfer him starting 1/13 but due to limited bed availability he did not arrive at [name] until late night 09/11/2023.  On [name] the patient has noted to have extensive wounds to both lower extremities but with no evidence of acute infection at this time.  Most prominent are deep bilateral heel ulcers with a thick overlying eschar.  New events last 24 hours / Subjective: No new complaints today.  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 Chestine [name] 1/24 -Discussed with Dr. Chestine [name] post angiogram. 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 (Active)  09/11/23 2350  Location: Sacrum  Location Orientation: Anterior;Mid  Staging: Stage 1 -  Intact skin with non-blanchable redness of a localized area usually over a bony prominence.  Wound Description (Comments):   Present on Admission: Yes     Nutrition Problem: Moderate Malnutrition Etiology: chronic illness, social / environmental circumstances   DVT prophylaxis:  enoxaparin (LOVENOX) injection 40 mg Start: 09/12/23 1600  Code Status: DNR Family Communication: None at bedside this morning Disposition Plan: TBD Status is: Inpatient Remains inpatient appropriate because: Angiogram 1/24   Antimicrobials:  Anti-infectives (From admission, onward)    Start     Dose/Rate Route Frequency Ordered Stop   09/12/23 0400  Vancomycin (VANCOCIN) 1,500 mg in sodium chloride 0.9 % 500 mL IVPB  Status:  Discontinued        1,500 mg 250 mL/hr over 120 Minutes Intravenous Every 24 hours 09/12/23 0244 09/12/23 1801   09/12/23 0300  vancomycin (VANCOREADY) IVPB 2000 mg/400 mL  Status:  Discontinued        2,000 mg 200 mL/hr over 120 Minutes Intravenous  Once 09/12/23 0142 09/12/23 0218   09/12/23 0200  cefTRIAXone  (ROCEPHIN) 2 g in sodium chloride 0.9 % 100 mL IVPB  Status:  Discontinued        2 g 200 mL/hr over 30 Minutes Intravenous Every 24 hours 09/12/23 0127 09/12/23 1801   09/12/23 0200  metroNIDAZOLE (FLAGYL) IVPB 500 mg  Status:  Discontinued        500 mg 100 mL/hr over 60 Minutes Intravenous Every 12 hours 09/12/23 0127 09/12/23 1801        Objective: Vitals:   09/17/23 2229 09/18/23 0653 09/18/23 0806 09/18/23 1228  BP: (!) 145/72 (!) 148/70 (!) 131/97   Pulse: 99 95 93   Resp: 18 17 16    Temp: 98.7 F (37.1 C) 98.5 F (36.9 C) 98.4 F (36.9 C)   TempSrc: Oral Oral Oral   SpO2: 93% 92% 98% 90%  Weight:      Height:        Intake/Output Summary (Last 24 hours) at 09/18/2023 1404 Last data filed at 09/18/2023 0000 Gross per 24 hour  Intake --  Output 1000 ml  Net -1000 ml    Filed Weights   09/12/23 0044  Weight: 91.6 kg    Examination:  General exam: Appears calm and comfortable  Respiratory system: Clear to auscultation. Respiratory effort normal. No respiratory distress. No conversational dyspnea.  Cardiovascular system: S1 & S2 heard, RRR. No murmurs.  Gastrointestinal system: Abdomen is nondistended, soft and nontender. Normal bowel sounds heard. Central nervous system: Alert and oriented. No focal neurological deficits. Speech clear.  Extremities: Symmetric in appearance  Skin: + Bilateral lower extremities and dry and clean dressing Psychiatry: Judgement and insight appear normal. Mood & affect appropriate.   Data Reviewed: I have personally reviewed following labs and imaging studies  CBC: Recent Labs  [name] 09/12/23 0128 09/13/23 0459 09/16/23 0550  WBC 11.2* 10.5 9.3  NEUTROABS 8.4*  --   --   HGB 11.6* 12.3* 11.4*  HCT 36.3* 38.0* 35.4*  MCV 99.5 97.7 98.1  PLT 206 194 329   Basic Metabolic Panel: Recent Labs  [name] 09/12/23 0128 09/13/23 0459 09/16/23 0550  NA 136 138 132*  K 4.3 4.4 4.5  CL 99 98 99  CO2 28 28 25   GLUCOSE 114* 101*  107*  BUN 26* 24* 15  CREATININE 1.23 1.13 1.02  CALCIUM 8.1* 8.5* 8.2*  MG 2.3 2.3  --   PHOS 2.9  --   --    GFR: Estimated Creatinine Clearance: 69 mL/min (by C-G formula based on SCr of 1.02 mg/dL). Liver Function Tests: Recent Labs  [name] 09/12/23 0128 09/13/23 0459  AST 73* 51*  ALT 59* 52*  ALKPHOS 94 86  BILITOT 0.9 0.8  PROT 5.9* 6.2*  ALBUMIN 2.2* 2.2*   No results for input(s): "LIPASE", "AMYLASE" in the last 168 hours. Recent Labs  [name] 09/12/23 1259  AMMONIA 19   Coagulation Profile: No results for input(s): "INR", "PROTIME" in the last 168 hours. Cardiac Enzymes: No results for input(s): "CKTOTAL", "CKMB", "CKMBINDEX", "TROPONINI" in the last 168 hours. BNP (last 3 results) No results for input(s): "PROBNP" in the last 8760  hours. HbA1C: No results for input(s): "HGBA1C" in the last 72 hours. CBG: No results for input(s): "GLUCAP" in the last 168 hours. Lipid Profile: No results for input(s): "CHOL", "HDL", "LDLCALC", "TRIG", "CHOLHDL", "LDLDIRECT" in the last 72 hours. Thyroid Function Tests: No results for input(s): "TSH", "T4TOTAL", "FREET4", "T3FREE", "THYROIDAB" in the last 72 hours. Anemia Panel: No results for input(s): "VITAMINB12", "FOLATE", "FERRITIN", "TIBC", "IRON", "RETICCTPCT" in the last 72 hours. Sepsis Labs: No results for input(s): "PROCALCITON", "LATICACIDVEN" in the last 168 hours.  Recent Results (from the past 240 hours)  Surgical PCR screen     Status: None   Collection Time: 09/12/23  3:34 AM   Specimen: Nasal Mucosa; Nasal Swab  Result Value Ref Range Status   MRSA, PCR NEGATIVE NEGATIVE Final   Staphylococcus aureus NEGATIVE NEGATIVE Final    Comment: (NOTE) The Xpert SA Assay (FDA approved for NASAL specimens in patients 53 years of age and older), is one component of a comprehensive surveillance program. It is not intended to diagnose infection nor to guide or monitor treatment. Performed at [name], [address], [place], [place] [address]       Radiology Studies: No results found.    Scheduled Meds:  [MAR Hold] ascorbic acid  250 mg Oral BID   [MAR Hold] enoxaparin (LOVENOX) injection  40 mg Subcutaneous Q24H   [MAR Hold] feeding supplement  237 mL Oral BID BM   [MAR Hold] rosuvastatin  20 mg Oral Daily   [MAR Hold] zinc sulfate (50mg  elemental zinc)  220 mg Oral Daily   Continuous Infusions:   LOS: 7 days   Time spent: 25 minutes   Noralee Stain, DO [facility] 09/18/2023, 2:04 PM   Available via Epic secure chat 7am-7pm After these hours, please refer to coverage provider listed on [email or web address]

## 2023-09-18 NOTE — Progress Notes (Signed)
Occupational Therapy Treatment Patient Details Name: [name] MRN: [id number] DOB: February 20, 1946 Today's Date: 09/18/2023   History of present illness 78 y.o. male presents to [facility] from [facility] 09/11/23 for vascular surgery consult/intervention for B LE. Pt admitted w/ deep B heel ulcers w/ eschar. LE angiogram scheduled for 1/24. Palliative following. PMHx: HTN, CA s/p CABG, HLD.   OT comments  Patient supine and agreeable to OT session. Patient requiring min assist while seated on EOB and able to maintain sitting balance while performing grooming and UB bathing tasks. Patient assisted back to supine at end of session due awaiting transport. Patient will benefit from continued inpatient follow up therapy, <3 hours/day with acute OT to continue to follow to address established goals to facilitate DC to next venue.      If plan is discharge home, recommend the following:  Two people to help with walking and/or transfers;Two people to help with bathing/dressing/bathroom;Assistance with cooking/housework;Assist for transportation;Help with stairs or ramp for entrance;Direct supervision/assist for financial management;Direct supervision/assist for medications management   Equipment Recommendations  Other (comment) (defer)    Recommendations for Other Services      Precautions / Restrictions Precautions Precautions: Fall Precaution Comments: Contact precs; B LE wounds, eschar on B heels, frequent loose stools Required Braces or Orthoses: Other Brace Other Brace: prevalon boots; float heels Restrictions Weight Bearing Restrictions Per Provider Order: No Other Position/Activity Restrictions: still full WB allowed per Dr Alvino Chapel 1/23       Mobility Bed Mobility Overal bed mobility: Needs Assistance Bed Mobility: Rolling, Sidelying to Sit, Sit to Sidelying Rolling: Min assist, Used rails Sidelying to sit: Min assist, Used rails     Sit to sidelying: Mod assist, Used rails General bed  mobility comments: assistance with trunk to get to EOB and assistance with BLEs to return to supine    Transfers Overall transfer level: Needs assistance                 General transfer comment: not attempted due to awaiting transport     Balance Overall balance assessment: Needs assistance Sitting-balance support: Bilateral upper extremity supported, Single extremity supported, Feet supported Sitting balance-Leahy Scale: Fair Sitting balance - Comments: able to perform grooming and UB bathing seated on EOB                                   ADL either performed or assessed with clinical judgement   ADL Overall ADL's : Needs assistance/impaired     Grooming: Wash/dry hands;Wash/dry face;Set up;Sitting Grooming Details (indicate cue type and reason): EOB Upper Body Bathing: Contact guard assist;Sitting Upper Body Bathing Details (indicate cue type and reason): EOB                                Extremity/Trunk Assessment              Vision       Perception     Praxis      Cognition Arousal: Alert Behavior During Therapy: WFL for tasks assessed/performed Overall Cognitive Status: No family/caregiver present to determine baseline cognitive functioning                                 General Comments: slow processing        Exercises  Shoulder Instructions       General Comments SpO2 94% on 2 liters, HR 91    Pertinent Vitals/ Pain       Pain Assessment Pain Assessment: Faces Faces Pain Scale: Hurts a little bit Pain Location: heels and bottom Pain Descriptors / Indicators: Grimacing, Guarding Pain Intervention(s): Limited activity within patient's tolerance, Monitored during session, Repositioned  Home Living                                          Prior Functioning/Environment              Frequency  Min 1X/week        Progress Toward Goals  OT Goals(current  goals can now be found in the care plan section)  Progress towards OT goals: Progressing toward goals  Acute Rehab OT Goals Patient Stated Goal: feel better OT Goal Formulation: With patient Time For Goal Achievement: 09/27/23 Potential to Achieve Goals: Fair ADL Goals Pt Will Perform Eating: with supervision;sitting Pt Will Perform Grooming: with supervision;sitting Pt Will Perform Upper Body Bathing: with set-up;sitting Pt Will Perform Lower Body Bathing: with min assist;sitting/lateral leans Pt Will Transfer to Toilet: with max assist;with +2 assist;with transfer board Additional ADL Goal #1: Pt will perform bed mobility with mod A as a precursor to ADL  Plan      Co-evaluation                 AM-PAC OT "6 Clicks" Daily Activity     Outcome Measure   Help from another person eating meals?: A Little Help from another person taking care of personal grooming?: A Little Help from another person toileting, which includes using toliet, bedpan, or urinal?: Total Help from another person bathing (including washing, rinsing, drying)?: A Lot Help from another person to put on and taking off regular upper body clothing?: A Little Help from another person to put on and taking off regular lower body clothing?: Total 6 Click Score: 13    End of Session Equipment Utilized During Treatment: Oxygen  OT Visit Diagnosis: Unsteadiness on feet (R26.81);Muscle weakness (generalized) (M62.81);Other symptoms and signs involving cognitive function   Activity Tolerance Patient tolerated treatment well   Patient Left in bed;with call bell/phone within reach;with bed alarm set   Nurse Communication Mobility status        Time: 1610-9604 OT Time Calculation (min): 23 min  Charges: OT General Charges $OT Visit: 1 Visit OT Treatments $Self Care/Home Management : 8-22 mins $Therapeutic Activity: 8-22 mins  Alfonse Flavors, OTA Acute Rehabilitation Services  Office [phone]   Dewain Penning 09/18/2023, 1:15 PM

## 2023-09-18 NOTE — Plan of Care (Signed)
  Problem: Clinical Measurements: Goal: Ability to maintain clinical measurements within normal limits will improve Outcome: Progressing Goal: Will remain free from infection Outcome: Progressing Goal: Diagnostic test results will improve Outcome: Progressing Goal: Respiratory complications will improve Outcome: Progressing Goal: Cardiovascular complication will be avoided Outcome: Progressing   Problem: Clinical Measurements: Goal: Will remain free from infection Outcome: Progressing   Problem: Clinical Measurements: Goal: Respiratory complications will improve Outcome: Progressing   Problem: Nutrition: Goal: Adequate nutrition will be maintained Outcome: Progressing   Problem: Elimination: Goal: Will not experience complications related to bowel motility Outcome: Progressing Goal: Will not experience complications related to urinary retention Outcome: Progressing   Problem: Skin Integrity: Goal: Risk for impaired skin integrity will decrease Outcome: Progressing

## 2023-09-18 NOTE — Progress Notes (Signed)
BLE vein mapping has been completed.   Results can be found under chart review under CV PROC. 09/18/2023 4:42 PM Lakashia Collison RVT, RDMS

## 2023-09-18 NOTE — Progress Notes (Signed)
Patient received from cathlab, patient is alert and oriented *4, V/S taken, CCMD notified, CHG bath given, orientation given about the unit, call bell in reach,  Rt femoral site is level 0, pulse present with doppler.   09/18/23 1551  Vitals  Temp 98 F (36.7 C)  Temp Source Oral  BP (!) 168/90  MAP (mmHg) 107  BP Location Right Arm  BP Method Automatic  Patient Position (if appropriate) Lying  Pulse Rate 90  Pulse Rate Source Monitor  ECG Heart Rate 89  Resp 19  MEWS COLOR  MEWS Score Color Green  Oxygen Therapy  SpO2 95 %  O2 Device Room Air  Pain Assessment  Pain Scale 0-10  Pain Score 0  MEWS Score  MEWS Temp 0  MEWS Systolic 0  MEWS Pulse 0  MEWS RR 0  MEWS LOC 0  MEWS Score 0

## 2023-09-19 ENCOUNTER — Inpatient Hospital Stay (HOSPITAL_COMMUNITY): Payer: Medicare HMO

## 2023-09-19 DIAGNOSIS — I70235 Atherosclerosis of native arteries of right leg with ulceration of other part of foot: Secondary | ICD-10-CM | POA: Diagnosis not present

## 2023-09-19 DIAGNOSIS — L97519 Non-pressure chronic ulcer of other part of right foot with unspecified severity: Secondary | ICD-10-CM | POA: Diagnosis not present

## 2023-09-19 NOTE — Progress Notes (Signed)
PROGRESS NOTE    Jack DYAL  [name]:[id number] DOB: 02/19/1946 DOA: 09/11/2023 PCP: Toma Deiters, MD     Brief Narrative:  Jack [name] is a 78 year old with a history of HTN, CAD status post CABG, and HLD who admits to not taking his usual medications or caring for himself appropriately since the death of his wife in 10-02-2015.  He was admitted to [facility] 09/03/2023 for evaluation of what was felt to be bilateral lower extremity cellulitis with ulcerations.  He underwent I&D at that facility and was treated with empiric antibiotics.  Wound cultures grew MRSA sensitive to doxycycline.  ABIs of the right leg were noted to be significantly diminished and it was felt the patient would likely require vascular intervention for wound healing.  Attempts were made to transfer him starting 1/13 but due to limited bed availability he did not arrive at [facility] until late night 09/11/2023.  On his arrival at [facility] the patient has noted to have extensive wounds to both lower extremities but with no evidence of acute infection at this time.  Most prominent are deep bilateral heel ulcers with a thick overlying eschar.  New events last 24 hours / Subjective: No new issues reported.  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 1/18 are consistent with severe right and left lower extremity arterial disease at 0.37 and 0.32 respectively -Continue wound care  -Status post angiogram 1/24.  Recommendation is for left AKA.  If right heel MRI is negative for  osteomyelitis, could consider right femoral endarterectomy with bypass if desired. 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 (Active)  09/11/23 2350  Location: Sacrum  Location Orientation: Anterior;Mid  Staging: Stage 1 -  Intact skin with non-blanchable redness of a localized area usually over a bony prominence.  Wound Description (Comments):   Present on Admission: Yes  Dressing Type Foam - Lift dressing to assess site every shift 09/18/23 October 01, 2202     Pressure Injury 09/18/23 Buttocks Left Stage 2 -  Partial thickness loss of dermis presenting as a shallow open injury with a red, pink wound bed without slough. (Active)  09/18/23 2200  Location: Buttocks  Location Orientation: Left  Staging: Stage 2 -  Partial thickness loss of dermis presenting as a shallow open injury with a red, pink  wound bed without slough.  Wound Description (Comments):   Present on Admission:   Dressing Type Foam - Lift dressing to assess site every shift 09/18/23 2204     Nutrition Problem: Moderate Malnutrition Etiology: chronic illness, social / environmental circumstances   DVT prophylaxis:  enoxaparin (LOVENOX) injection 40 mg Start: 09/12/23 1600  Code Status: DNR Family Communication:  None at bedside this morning Disposition Plan: TBD Status is: Inpatient Remains inpatient appropriate because: MRI pending.  Antimicrobials:  Anti-infectives (From admission, onward)    Start     Dose/Rate Route Frequency Ordered Stop   09/12/23 0400  Vancomycin (VANCOCIN) 1,500 mg in sodium chloride 0.9 % 500 mL IVPB  Status:  Discontinued        1,500 mg 250 mL/hr over 120 Minutes Intravenous Every 24 hours 09/12/23 0244 09/12/23 1801   09/12/23 0300  vancomycin (VANCOREADY) IVPB 2000 mg/400 mL  Status:  Discontinued        2,000 mg 200 mL/hr over 120 Minutes Intravenous  Once 09/12/23 0142 09/12/23 0218   09/12/23 0200  cefTRIAXone (ROCEPHIN) 2 g in sodium chloride 0.9 % 100 mL IVPB  Status:  Discontinued        2 g 200 mL/hr over 30 Minutes Intravenous Every 24 hours 09/12/23 0127 09/12/23 1801   09/12/23 0200  metroNIDAZOLE (FLAGYL) IVPB 500 mg  Status:  Discontinued        500 mg 100 mL/hr over 60 Minutes Intravenous Every 12 hours 09/12/23 0127 09/12/23 1801        Objective: Vitals:   09/18/23 2039 09/19/23 0431 09/19/23 0742 09/19/23 1200  BP: 138/77 133/63 136/68 (!) 147/72  Pulse: (!) 103 84 90 92  Resp: 20 18  19   Temp: 98.5 F (36.9 C) 98.4 F (36.9 C) 98.3 F (36.8 C) 97.8 F (36.6 C)  TempSrc: Oral Oral Oral Oral  SpO2: 93% 92% 92% 96%  Weight:      Height:        Intake/Output Summary (Last 24 hours) at 09/19/2023 1348 Last data filed at 09/19/2023 1318 Gross per 24 hour  Intake 240 ml  Output 1100 ml  Net -860 ml    Filed Weights   09/12/23 0044  Weight: 91.6 kg    Examination:  General exam: Appears calm and comfortable   Data Reviewed: I have personally reviewed following labs and imaging studies  CBC: Recent Labs  Lab 09/13/23 0459 09/16/23 0550  WBC 10.5 9.3  HGB 12.3* 11.4*  HCT 38.0* 35.4*  MCV 97.7 98.1  PLT 194 329   Basic Metabolic Panel: Recent Labs  Lab 09/13/23 0459 09/16/23 0550  NA 138 132*  K 4.4 4.5  CL 98  99  CO2 28 25  GLUCOSE 101* 107*  BUN 24* 15  CREATININE 1.13 1.02  CALCIUM 8.5* 8.2*  MG 2.3  --    GFR: Estimated Creatinine Clearance: 69 mL/min (by C-G formula based on SCr of 1.02 mg/dL). Liver Function Tests: Recent Labs  Lab 09/13/23 0459  AST 51*  ALT 52*  ALKPHOS 86  BILITOT 0.8  PROT 6.2*  ALBUMIN 2.2*   No results for input(s): "LIPASE", "AMYLASE" in the last 168 hours. No results for input(s): "AMMONIA" in the last 168 hours.  Coagulation Profile: No results for input(s): "INR", "PROTIME" in the last 168 hours. Cardiac Enzymes: No results for input(s): "CKTOTAL", "CKMB", "CKMBINDEX", "TROPONINI" in the last 168 hours. BNP (last 3 results) No results for input(s): "PROBNP" in the last 8760  hours. HbA1C: No results for input(s): "HGBA1C" in the last 72 hours. CBG: No results for input(s): "GLUCAP" in the last 168 hours. Lipid Profile: No results for input(s): "CHOL", "HDL", "LDLCALC", "TRIG", "CHOLHDL", "LDLDIRECT" in the last 72 hours. Thyroid Function Tests: No results for input(s): "TSH", "T4TOTAL", "FREET4", "T3FREE", "THYROIDAB" in the last 72 hours. Anemia Panel: No results for input(s): "VITAMINB12", "FOLATE", "FERRITIN", "TIBC", "IRON", "RETICCTPCT" in the last 72 hours. Sepsis Labs: No results for input(s): "PROCALCITON", "LATICACIDVEN" in the last 168 hours.  Recent Results (from the past 240 hours)  Surgical PCR screen     Status: None   Collection Time: 09/12/23  3:34 AM   Specimen: Nasal Mucosa; Nasal Swab  Result Value Ref Range Status   MRSA, PCR NEGATIVE NEGATIVE Final   Staphylococcus aureus NEGATIVE NEGATIVE Final    Comment: (NOTE) The Xpert SA Assay (FDA approved for NASAL specimens in patients 37 years of age and older), is one component of a comprehensive surveillance program. It is not intended to diagnose infection nor to guide or monitor treatment. Performed at [facility], [address], [place], [place] [address]        Radiology Studies: VAS [place] LOWER EXTREMITY SAPHENOUS VEIN MAPPING Result Date: 09/18/2023 LOWER EXTREMITY VEIN MAPPING Patient Name:  NICHOLAD KAUTZMAN  Date of Exam:   09/18/2023 Medical Rec #: [id number]      Accession #:    [id number] Date of Birth: 12-04-1945      Patient Gender: M Patient Age:   44 years Exam Location:  [facility] Procedure:      VAS [place] LOWER EXTREMITY SAPHENOUS VEIN MAPPING Referring Phys: Sherald Hess --------------------------------------------------------------------------------  Indications: Pre-op History:     History of PAD; patient is pre-operative for lower extremity bypass              graft.  Limitations: Banadges and wounds of bilateral calf & ankle. Comparison Study: No previous exams Performing Technologist: Jody Hill RVT, RDMS  Examination Guidelines: A complete evaluation includes B-mode imaging, spectral Doppler, color Doppler, and power Doppler as needed of all accessible portions of each vessel. Bilateral testing is considered an integral part of a complete examination. Limited examinations for reoccurring indications may be performed as noted. +------------+--------------+----------------------+------------+--------------+ RT Diameter  RT Findings           GSV          LT Diameter  LT Findings       (cm)                                            (cm)                   +------------+--------------+----------------------+------------+--------------+     0.68      branching       Saphenofemoral        0.45                                                    Junction                                  +------------+--------------+----------------------+------------+--------------+  0.63                      Proximal thigh        0.47                   +------------+--------------+----------------------+------------+--------------+     0.53      branching         Mid thigh           0.33      branching     +------------+--------------+----------------------+------------+--------------+     0.58                       Distal thigh         0.14                   +------------+--------------+----------------------+------------+--------------+     0.51                           Knee                     not visualized +------------+--------------+----------------------+------------+--------------+     0.48      branching         Prox calf           0.22                   +------------+--------------+----------------------+------------+--------------+     0.33      branching          Mid calf           0.40      branching    +------------+--------------+----------------------+------------+--------------+             not visualized     Distal calf                  not visualized +------------+--------------+----------------------+------------+--------------+             not visualized        Ankle                     not visualized +------------+--------------+----------------------+------------+--------------+    Preliminary    PERIPHERAL VASCULAR CATHETERIZATION Result Date: 09/18/2023 Images from the original result were not included.   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  Ultrasound-guided access right common femoral artery 2.  Aortogram with catheter selection of abdominal aorta 3.  Left lower extremity arteriogram with catheter selection of the left common femoral 4.  Right lower extremity arteriogram with catheter runoff from the right common femoral sheath  Indications: 77 year old male transferred from [facility] for evaluation of bilateral heel ulcers.  Previously discussed with family that I think he needs  bilateral above-knee amputation versus palliative wound care.  He presents today for aortogram, lower extremity arteriogram after risk benefits discussed.  Findings:  Ultrasound-guided access right common femoral artery.  Diseased abdominal aorta with small aneurysm.   On the left he has a high-grade iliac stenosis at the junction of the common iliac and external iliac  artery over 80%.  The left common femoral has a high-grade stenosis >80% and is calcified.  The profundus patent.  He has a flush SFA occlusion.  The SFA as well as the above and below-knee popliteal artery are all occluded.  He reconstitutes a tibial trifurcation.  AT is the dominant runoff into the foot and is widely patent.  The posterior tibial is also patent but has a high-grade stenosis in the distal calf.  On the right has significantly calcified iliac arteries.  He also has significant common femoral disease >60% with a patent profunda and flush SFA occlusion.  SFA is occluded with occluded above and below-knee popliteal artery.  AT reconstitutes and is the dominant runoff into the foot.             Procedure:  The patient was identified in the holding area and taken to room 8.  The patient was then placed supine on the table and prepped and draped in the usual sterile fashion.  A time out was called.  Ultrasound was used to evaluate the right common femoral artery.  It was patent .  A digital ultrasound image was acquired.  A micropuncture needle was used to access the right common femoral artery under ultrasound guidance.  An 018 wire was advanced without resistance and a micropuncture sheath was placed.  The 018 wire was removed and a benson wire was placed.  The micropuncture sheath was exchanged for a 5 french sheath.  An omniflush catheter was advanced over the wire to the level of L-1.  An abdominal angiogram was obtained.  I then went across the aortic bifurcation with an Omni Flush catheter and the wire would not advance.  I changed  to a glidewire and crossing catheter..  The catheter was placed into theleft common femoral artery and left runoff was obtained.  We then removed wires and catheters and got right leg runoff from the sheath in the right groin.  Pertinent findings are noted above.  He has no endovascular options.  Plan: Bilateral flush SFA occlusion with common femoral disease and no endovascular option.  I reviewed his case with several of my partners.  Ultimately I think he needs a left above-knee amputation as the left heel eschar is extensive and I do not think the foot is salvageable.  If there is a foot to save it would be the right foot and this would require common femoral endarterectomy with femoral to anterior tibial bypass.  Even this would be high risk for no wound healing long-term.  Have discussed getting foot MRI to ensure no osteomyelitis in the heel.   [name], MD [facility] [place] Office: [phone]      Scheduled Meds:  ascorbic acid  250 mg Oral BID   aspirin EC  81 mg Oral Daily   enoxaparin (LOVENOX) injection  40 mg Subcutaneous Q24H   feeding supplement  237 mL Oral BID BM   rosuvastatin  20 mg Oral Daily   sodium chloride flush  3 mL Intravenous Q12H   zinc sulfate (50mg  elemental zinc)  220 mg Oral Daily   Continuous Infusions:  sodium chloride       LOS: 8 days   Time spent: 20 minutes   Noralee Stain, DO [facility] 09/19/2023, 1:48 PM   Available via Epic secure chat 7am-7pm After these hours, please refer to coverage provider listed on [email or web address]

## 2023-09-19 NOTE — Progress Notes (Signed)
PHARMACIST LIPID MONITORING  Jack [name] is a 78 y.o. male admitted on 09/11/2023 with BLE wounds.  Pharmacy has been consulted to optimize lipid-lowering therapy with the indication of secondary prevention for clinical ASCVD.  Recent Labs:  Lipid Panel (last 6 months):   Lab Results  Component Value Date   CHOL 99 09/13/2023   TRIG 79 09/13/2023   HDL 20 (L) 09/13/2023   CHOLHDL 5.0 09/13/2023   VLDL 16 09/13/2023   LDLCALC 63 09/13/2023    Hepatic function panel (last 6 months):   Lab Results  Component Value Date   AST 51 (H) 09/13/2023   ALT 52 (H) 09/13/2023   ALKPHOS 86 09/13/2023   BILITOT 0.8 09/13/2023    SCr (since admission):   Serum creatinine: 1.02 mg/dL 16/10/96 0454 Estimated creatinine clearance: 69 mL/min  Current therapy and lipid therapy tolerance Current lipid-lowering therapy: rosuvastatin 20 mg/d Previous lipid-lowering therapies (if applicable): atorvastatin Documented or reported allergies or intolerances to lipid-lowering therapies (if applicable): no  Assessment:   Patient agrees with changes to lipid-lowering therapy  Plan:    1.Statin intensity (high intensity recommended for all patients regardless of the LDL):  The patient was started on a high intensity statin during this admission.  2.Add ezetimibe (if any one of the following):   Not indicated at this time.  3.Refer to lipid clinic:   No  4.Follow-up with:  Primary care provider - Toma Deiters, MD  5.Follow-up labs after discharge:  No changes in lipid therapy, repeat a lipid panel in one year.      Thank you for involving pharmacy in this patient's care.  Loura Back, PharmD, BCPS Clinical Pharmacist Clinical phone for 09/19/2023 is [phone] 09/19/2023 2:36 PM

## 2023-09-19 NOTE — Plan of Care (Signed)
  Problem: Education: Goal: Knowledge of General Education information will improve Description: Including pain rating scale, medication(s)/side effects and non-pharmacologic comfort measures Outcome: Progressing   Problem: Health Behavior/Discharge Planning: Goal: Ability to manage health-related needs will improve Outcome: Progressing   Problem: Clinical Measurements: Goal: Ability to maintain clinical measurements within normal limits will improve Outcome: Progressing Goal: Will remain free from infection Outcome: Progressing Goal: Diagnostic test results will improve Outcome: Progressing Goal: Cardiovascular complication will be avoided Outcome: Progressing   Problem: Activity: Goal: Risk for activity intolerance will decrease Outcome: Progressing   Problem: Nutrition: Goal: Adequate nutrition will be maintained Outcome: Progressing   Problem: Elimination: Goal: Will not experience complications related to bowel motility Outcome: Progressing   Problem: Pain Managment: Goal: General experience of comfort will improve and/or be controlled Outcome: Progressing   Problem: Safety: Goal: Ability to remain free from injury will improve Outcome: Progressing   Problem: Skin Integrity: Goal: Risk for impaired skin integrity will decrease Outcome: Progressing

## 2023-09-19 NOTE — Progress Notes (Addendum)
  Progress Note    09/19/2023 8:55 AM 1 Day Post-Op  Subjective:  no complaints   Vitals:   09/19/23 0431 09/19/23 0742  BP: 133/63 136/68  Pulse: 84 90  Resp: 18   Temp: 98.4 F (36.9 C) 98.3 F (36.8 C)  SpO2: 92% 92%   Physical Exam: Lungs:  non labored Incisions:  R groin without hematoma Extremities:  dressing left in place BLE Neurologic: A&O  CBC    Component Value Date/Time   WBC 9.3 09/16/2023 0550   RBC 3.61 (L) 09/16/2023 0550   HGB 11.4 (L) 09/16/2023 0550   HCT 35.4 (L) 09/16/2023 0550   PLT 329 09/16/2023 0550   MCV 98.1 09/16/2023 0550   MCH 31.6 09/16/2023 0550   MCHC 32.2 09/16/2023 0550   RDW 14.9 09/16/2023 0550   LYMPHSABS 1.9 09/12/2023 0128   MONOABS 0.7 09/12/2023 0128   EOSABS 0.1 09/12/2023 0128   BASOSABS 0.1 09/12/2023 0128    BMET    Component Value Date/Time   NA 132 (L) 09/16/2023 0550   K 4.5 09/16/2023 0550   CL 99 09/16/2023 0550   CO2 25 09/16/2023 0550   GLUCOSE 107 (H) 09/16/2023 0550   BUN 15 09/16/2023 0550   CREATININE 1.02 09/16/2023 0550   CREATININE 1.04 08/07/2017 1012   CALCIUM 8.2 (L) 09/16/2023 0550   GFRNONAA >60 09/16/2023 0550   GFRAA >60 01/23/2018 0856    INR    Component Value Date/Time   INR 1.06 01/23/2018 0856     Intake/Output Summary (Last 24 hours) at 09/19/2023 0855 Last data filed at 09/18/2023 1844 Gross per 24 hour  Intake 240 ml  Output 500 ml  Net -260 ml     Assessment/Plan:  78 y.o. male with tissue loss BLE s/p diagnostic angiography via R CFA 1 Day Post-Op   Due to extensive tissue loss of the LLE, he will require AKA.  We will check MR of R heel to rule out osteomyelitis.  If negative, Dr. Chestine Spore would consider R femoral endarterectomy with femoral to ATA bypass.  He would still be at high risk for RLE limb loss with a successful bypass surgery.  Vein mapping completed.  R GSV can potentially be used as bypass conduit.  Further discussion pending MR R heel.   Emilie Rutter, PA-C [facility] [phone] 09/19/2023 8:55 AM  VASCULAR STAFF ADDENDUM: I have independently interviewed and examined the patient. I agree with the above.  No family at bedside this morning.  I reiterated to the patient that he will require left AKA with a slight possibility of limb preservation of the right leg pending MRI evaluation of the right foot.   Daria Pastures MD [facility] Phone Number: [phone] 09/19/2023 9:27 AM

## 2023-09-20 ENCOUNTER — Inpatient Hospital Stay (HOSPITAL_COMMUNITY): Payer: Medicare HMO

## 2023-09-20 DIAGNOSIS — I70235 Atherosclerosis of native arteries of right leg with ulceration of other part of foot: Secondary | ICD-10-CM | POA: Diagnosis not present

## 2023-09-20 DIAGNOSIS — L97519 Non-pressure chronic ulcer of other part of right foot with unspecified severity: Secondary | ICD-10-CM | POA: Diagnosis not present

## 2023-09-20 NOTE — Progress Notes (Signed)
PROGRESS NOTE    RIELEY HAUSMAN  WUJ:[id number] DOB: 1945/11/21 DOA: 09/11/2023 PCP: Toma Deiters, MD     Brief Narrative:  LANDIS DOWDY is a 78 year old with a history of HTN, CAD status post CABG, and HLD who admits to not taking his usual medications or caring for himself appropriately since the death of his wife in Oct 11, 2015.  He was admitted to [facility] 09/03/2023 for evaluation of what was felt to be bilateral lower extremity cellulitis with ulcerations.  He underwent I&D at that facility and was treated with empiric antibiotics.  Wound cultures grew MRSA sensitive to doxycycline.  ABIs of the right leg were noted to be significantly diminished and it was felt the patient would likely require vascular intervention for wound healing.  Attempts were made to transfer him starting 1/13 but due to limited bed availability he did not arrive at [facility] until late night 09/11/2023.  On his arrival at [facility] the patient has noted to have extensive wounds to both lower extremities but with no evidence of acute infection at this time.  Most prominent are deep bilateral heel ulcers with a thick overlying eschar.  New events last 24 hours / Subjective: Per RN, patient was unable to tolerate MRI due to positioning.  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  Recommendation is  for left AKA.  If right heel MRI is negative for osteomyelitis, could consider right femoral endarterectomy with bypass if desired. 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 (Active)  09/11/23 2350  Location: Sacrum  Location Orientation: Anterior;Mid  Staging: Stage 1 -  Intact skin with non-blanchable redness of a localized area usually over a bony prominence.  Wound Description (Comments):   Present on Admission: Yes  Dressing Type Foam - Lift dressing to assess site every shift;Gauze (Comment) 09/20/23 0749     Pressure Injury 09/18/23 Buttocks Left Stage 2 -  Partial thickness loss of dermis presenting as a shallow open injury with a red, pink wound bed without slough. (Active)  09/18/23 2200  Location: Buttocks  Location Orientation: Left  Staging: Stage 2 -  Partial thickness  loss of dermis presenting as a shallow open injury with a red, pink wound bed without slough.  Wound Description (Comments):   Present on Admission:   Dressing Type Foam - Lift dressing to assess site every shift;Gauze (Comment) 09/20/23 0749     Pressure Injury 09/19/23 Heel Right Unstageable - Full thickness tissue loss in which the base of the injury is covered by slough  (yellow, tan, gray, green or brown) and/or eschar (tan, brown or black) in the wound bed. (Active)  09/19/23 1736  Location: Heel  Location Orientation: Right  Staging: Unstageable - Full thickness tissue loss in which the base of the injury is covered by slough (yellow, tan, gray, green or brown) and/or eschar (tan, brown or black) in the wound bed.  Wound Description (Comments):   Present on Admission: Yes  Dressing Type Foam - Lift dressing to assess site every shift;Gauze (Comment) 09/20/23 0749     Nutrition Problem: Moderate Malnutrition Etiology: chronic illness, social / environmental circumstances   DVT prophylaxis:  enoxaparin (LOVENOX) injection 40 mg Start: 09/12/23 1600  Code Status: DNR Family Communication: None at bedside this morning Disposition Plan: TBD Status is: Inpatient Remains inpatient appropriate because: Pending vascular surgery intervention   Antimicrobials:  Anti-infectives (From admission, onward)    Start     Dose/Rate Route Frequency Ordered Stop   09/12/23 0400  Vancomycin (VANCOCIN) 1,500 mg in sodium chloride 0.9 % 500 mL IVPB  Status:  Discontinued        1,500 mg 250 mL/hr over 120 Minutes Intravenous Every 24 hours 09/12/23 0244 09/12/23 1801   09/12/23 0300  vancomycin (VANCOREADY) IVPB 2000 mg/400 mL  Status:  Discontinued        2,000 mg 200 mL/hr over 120 Minutes Intravenous  Once 09/12/23 0142 09/12/23 0218   09/12/23 0200  cefTRIAXone (ROCEPHIN) 2 g in sodium chloride 0.9 % 100 mL IVPB  Status:  Discontinued        2 g 200 mL/hr over 30 Minutes Intravenous Every 24 hours 09/12/23 0127 09/12/23 1801   09/12/23 0200  metroNIDAZOLE (FLAGYL) IVPB 500 mg  Status:  Discontinued        500 mg 100 mL/hr over 60 Minutes Intravenous Every 12 hours 09/12/23 0127 09/12/23 1801        Objective: Vitals:   09/19/23 1549 09/19/23 2100 09/20/23 0033 09/20/23 0900  BP: (!) 145/73 132/65 (!) 148/78 (!) 148/70  Pulse: 98 88 93 86  Resp: 19  19 18 18   Temp: 98.5 F (36.9 C) 98.7 F (37.1 C) 99.7 F (37.6 C) 98.9 F (37.2 C)  TempSrc: Oral Oral Oral Oral  SpO2: 92%  94% 96%  Weight:      Height:        Intake/Output Summary (Last 24 hours) at 09/20/2023 1104 Last data filed at 09/20/2023 0034 Gross per 24 hour  Intake 240 ml  Output 1100 ml  Net -860 ml    Filed Weights   09/12/23 0044  Weight: 91.6 kg    Examination:  General exam: Appears calm and comfortable   Data Reviewed: I have personally reviewed following labs and imaging studies  CBC: Recent Labs  Lab 09/16/23 0550  WBC 9.3  HGB 11.4*  HCT 35.4*  MCV 98.1  PLT 329   Basic Metabolic Panel: Recent Labs  Lab 09/16/23 0550  NA 132*  K 4.5  CL 99  CO2 25  GLUCOSE 107*  BUN 15  CREATININE 1.02  CALCIUM 8.2*  GFR: Estimated Creatinine Clearance: 69 mL/min (by C-G formula based on SCr of 1.02 mg/dL). Liver Function Tests: No results for input(s): "AST", "ALT", "ALKPHOS", "BILITOT", "PROT", "ALBUMIN" in the last 168 hours.  No results for input(s): "LIPASE", "AMYLASE" in the last 168 hours. No results for input(s): "AMMONIA" in the last 168 hours.  Coagulation Profile: No results for input(s): "INR", "PROTIME" in the last 168 hours. Cardiac Enzymes: No results for input(s): "CKTOTAL", "CKMB", "CKMBINDEX", "TROPONINI" in the last 168 hours. BNP (last 3 results) No results for input(s): "PROBNP" in the last 8760 hours. HbA1C: No results for input(s): "HGBA1C" in the last 72 hours. CBG: No results for input(s): "GLUCAP" in the last 168 hours. Lipid Profile: No results for input(s): "CHOL", "HDL", "LDLCALC", "TRIG", "CHOLHDL", "LDLDIRECT" in the last 72 hours. Thyroid Function Tests: No results for input(s): "TSH", "T4TOTAL", "FREET4", "T3FREE", "THYROIDAB" in the last 72 hours. Anemia Panel: No results for input(s): "VITAMINB12", "FOLATE", "FERRITIN", "TIBC", "IRON", "RETICCTPCT" in the last 72 hours. Sepsis Labs: No results for  input(s): "PROCALCITON", "LATICACIDVEN" in the last 168 hours.  Recent Results (from the past 240 hours)  Surgical PCR screen     Status: None   Collection Time: 09/12/23  3:34 AM   Specimen: Nasal Mucosa; Nasal Swab  Result Value Ref Range Status   MRSA, PCR NEGATIVE NEGATIVE Final   Staphylococcus aureus NEGATIVE NEGATIVE Final    Comment: (NOTE) The Xpert SA Assay (FDA approved for NASAL specimens in patients 54 years of age and older), is one component of a comprehensive surveillance program. It is not intended to diagnose infection nor to guide or monitor treatment. Performed at [facility], [address] [place], [place] [address]       Radiology Studies: VAS [place] LOWER EXTREMITY SAPHENOUS VEIN MAPPING Result Date: 09/18/2023 LOWER EXTREMITY VEIN MAPPING Patient Name:  MARIUSZ JUBB  Date of Exam:   09/18/2023 Medical Rec #: [id number]      Accession #:    [id number] Date of Birth: 1946-08-25      Patient Gender: M Patient Age:   34 years Exam Location:  [facility] Procedure:      VAS [place] LOWER EXTREMITY SAPHENOUS VEIN MAPPING Referring Phys: Sherald Hess --------------------------------------------------------------------------------  Indications: Pre-op History:     History of PAD; patient is pre-operative for lower extremity bypass              graft.  Limitations: Banadges and wounds of bilateral calf & ankle. Comparison Study: No previous exams Performing Technologist: Jody Hill RVT, RDMS  Examination Guidelines: A complete evaluation includes B-mode imaging, spectral Doppler, color Doppler, and power Doppler as needed of all accessible portions of each vessel. Bilateral testing is considered an integral part of a complete examination. Limited examinations for reoccurring indications may be performed as noted. +------------+--------------+----------------------+------------+--------------+ RT Diameter  RT Findings           GSV          LT Diameter  LT Findings        (cm)                                            (cm)                   +------------+--------------+----------------------+------------+--------------+     0.68      branching  Saphenofemoral        0.45                                                    Junction                                  +------------+--------------+----------------------+------------+--------------+     0.63                      Proximal thigh        0.47                   +------------+--------------+----------------------+------------+--------------+     0.53      branching         Mid thigh           0.33      branching    +------------+--------------+----------------------+------------+--------------+     0.58                       Distal thigh         0.14                   +------------+--------------+----------------------+------------+--------------+     0.51                           Knee                     not visualized +------------+--------------+----------------------+------------+--------------+     0.48      branching         Prox calf           0.22                   +------------+--------------+----------------------+------------+--------------+     0.33      branching          Mid calf           0.40      branching    +------------+--------------+----------------------+------------+--------------+             not visualized     Distal calf                  not visualized +------------+--------------+----------------------+------------+--------------+             not visualized        Ankle                     not visualized +------------+--------------+----------------------+------------+--------------+    Preliminary    PERIPHERAL VASCULAR CATHETERIZATION Result Date: 09/18/2023 Images from the original result were not included.   Patient name: JONN CHAIKIN           MRN: [id number]        DOB: 1945/10/20          Sex: male  09/18/2023  Pre-operative Diagnosis: Critical limb ischemia of bilateral lower extremities with bilateral heel wounds Post-operative diagnosis:  Same Surgeon:  [name], MD Procedure Performed: 1.  Ultrasound-guided access right common femoral artery 2.  Aortogram with catheter selection of abdominal aorta 3.  Left lower extremity arteriogram with catheter selection of the left  common femoral 4.  Right lower extremity arteriogram with catheter runoff from the right common femoral sheath  Indications: 78 year old male transferred from [facility] for evaluation of bilateral heel ulcers.  Previously discussed with family that I think he needs bilateral above-knee amputation versus palliative wound care.  He presents today for aortogram, lower extremity arteriogram after risk benefits discussed.  Findings:  Ultrasound-guided access right common femoral artery.  Diseased abdominal aorta with small aneurysm.   On the left he has a high-grade iliac stenosis at the junction of the common iliac and external iliac artery over 80%.  The left common femoral has a high-grade stenosis >80% and is calcified.  The profundus patent.  He has a flush SFA occlusion.  The SFA as well as the above and below-knee popliteal artery are all occluded.  He reconstitutes a tibial trifurcation.  AT is the dominant runoff into the foot and is widely patent.  The posterior tibial is also patent but has a high-grade stenosis in the distal calf.  On the right has significantly calcified iliac arteries.  He also has significant common femoral disease >60% with a patent profunda and flush SFA occlusion.  SFA is occluded with occluded above and below-knee popliteal artery.  AT reconstitutes and is the dominant runoff into the foot.             Procedure:  The patient was identified in the holding area and taken to room 8.  The patient was then placed supine on the table and prepped and draped in the usual sterile fashion.  A time out was called.   Ultrasound was used to evaluate the right common femoral artery.  It was patent .  A digital ultrasound image was acquired.  A micropuncture needle was used to access the right common femoral artery under ultrasound guidance.  An 018 wire was advanced without resistance and a micropuncture sheath was placed.  The 018 wire was removed and a benson wire was placed.  The micropuncture sheath was exchanged for a 5 french sheath.  An omniflush catheter was advanced over the wire to the level of L-1.  An abdominal angiogram was obtained.  I then went across the aortic bifurcation with an Omni Flush catheter and the wire would not advance.  I changed to a glidewire and crossing catheter..  The catheter was placed into theleft common femoral artery and left runoff was obtained.  We then removed wires and catheters and got right leg runoff from the sheath in the right groin.  Pertinent findings are noted above.  He has no endovascular options.  Plan: Bilateral flush SFA occlusion with common femoral disease and no endovascular option.  I reviewed his case with several of my partners.  Ultimately I think he needs a left above-knee amputation as the left heel eschar is extensive and I do not think the foot is salvageable.  If there is a foot to save it would be the right foot and this would require common femoral endarterectomy with femoral to anterior tibial bypass.  Even this would be high risk for no wound healing long-term.  Have discussed getting foot MRI to ensure no osteomyelitis in the heel.   [name], MD [facility] [place] Office: [phone]      Scheduled Meds:  ascorbic acid  250 mg Oral BID   aspirin EC  81 mg Oral Daily   enoxaparin (LOVENOX) injection  40 mg Subcutaneous Q24H   feeding supplement  237 mL Oral BID BM   rosuvastatin  20 mg Oral Daily   sodium chloride flush  3 mL Intravenous Q12H   zinc sulfate (50mg  elemental zinc)  220 mg Oral Daily    Continuous Infusions:     LOS: 9 days   Time spent: 20 minutes   Noralee Stain, DO [facility] 09/20/2023, 11:04 AM   Available via Epic secure chat 7am-7pm After these hours, please refer to coverage provider listed on [email or web address]

## 2023-09-20 NOTE — Progress Notes (Addendum)
  Progress Note    09/20/2023 8:58 AM 2 Days Post-Op  Subjective:  no complaints   Vitals:   09/19/23 2100 09/20/23 0033  BP: 132/65 (!) 148/78  Pulse: 88 93  Resp: 19 18  Temp: 98.7 F (37.1 C) 99.7 F (37.6 C)  SpO2:  94%   Physical Exam: Lungs:  non labored Extremities:  R PT by doppler Neurologic: A&O  CBC    Component Value Date/Time   WBC 9.3 09/16/2023 0550   RBC 3.61 (L) 09/16/2023 0550   HGB 11.4 (L) 09/16/2023 0550   HCT 35.4 (L) 09/16/2023 0550   PLT 329 09/16/2023 0550   MCV 98.1 09/16/2023 0550   MCH 31.6 09/16/2023 0550   MCHC 32.2 09/16/2023 0550   RDW 14.9 09/16/2023 0550   LYMPHSABS 1.9 09/12/2023 0128   MONOABS 0.7 09/12/2023 0128   EOSABS 0.1 09/12/2023 0128   BASOSABS 0.1 09/12/2023 0128    BMET    Component Value Date/Time   NA 132 (L) 09/16/2023 0550   K 4.5 09/16/2023 0550   CL 99 09/16/2023 0550   CO2 25 09/16/2023 0550   GLUCOSE 107 (H) 09/16/2023 0550   BUN 15 09/16/2023 0550   CREATININE 1.02 09/16/2023 0550   CREATININE 1.04 08/07/2017 1012   CALCIUM 8.2 (L) 09/16/2023 0550   GFRNONAA >60 09/16/2023 0550   GFRAA >60 01/23/2018 0856    INR    Component Value Date/Time   INR 1.06 01/23/2018 0856     Intake/Output Summary (Last 24 hours) at 09/20/2023 0858 Last data filed at 09/20/2023 0034 Gross per 24 hour  Intake 240 ml  Output 1100 ml  Net -860 ml     Assessment/Plan:  78 y.o. male is s/p tissue loss BLE 2 Days Post-Op   No family at the bedside this morning.  Tentative plan is for L AKA.  He was unable to tolerate MR R heel to evaluate for osteomyelitis.  We will check XR of R foot today; if findings suspicious for heel osteo then we will need to premedicate him for MRI.  He is being considered for R leg bypass if R heel is negative for osteomyelitis.  He is at risk for R leg limb loss even with successful bypass surgery.    Emilie Rutter, PA-C [facility] [phone] 09/20/2023 8:58  AM  VASCULAR STAFF ADDENDUM: I have independently interviewed and examined the patient. I agree with the above.   Daria Pastures MD [facility] Phone Number: [phone] 09/20/2023 9:36 AM

## 2023-09-20 NOTE — Plan of Care (Signed)
Problem: Education: Goal: Knowledge of General Education information will improve Description: Including pain rating scale, medication(s)/side effects and non-pharmacologic comfort measures Outcome: Progressing   Problem: Health Behavior/Discharge Planning: Goal: Ability to manage health-related needs will improve Outcome: Progressing   Problem: Clinical Measurements: Goal: Ability to maintain clinical measurements within normal limits will improve Outcome: Progressing Goal: Will remain free from infection Outcome: Progressing Goal: Diagnostic test results will improve Outcome: Progressing Goal: Respiratory complications will improve Outcome: Progressing Goal: Cardiovascular complication will be avoided Outcome: Progressing   Problem: Activity: Goal: Risk for activity intolerance will decrease Outcome: Progressing   Problem: Nutrition: Goal: Adequate nutrition will be maintained Outcome: Progressing   Problem: Pain Managment: Goal: General experience of comfort will improve and/or be controlled Outcome: Progressing   Problem: Safety: Goal: Ability to remain free from injury will improve Outcome: Progressing   Problem: Skin Integrity: Goal: Risk for impaired skin integrity will decrease Outcome: Progressing

## 2023-09-21 ENCOUNTER — Encounter (HOSPITAL_COMMUNITY): Payer: Self-pay | Admitting: Vascular Surgery

## 2023-09-21 ENCOUNTER — Inpatient Hospital Stay (HOSPITAL_COMMUNITY): Payer: Medicare HMO

## 2023-09-21 DIAGNOSIS — Z7189 Other specified counseling: Secondary | ICD-10-CM | POA: Diagnosis not present

## 2023-09-21 DIAGNOSIS — I70235 Atherosclerosis of native arteries of right leg with ulceration of other part of foot: Secondary | ICD-10-CM | POA: Diagnosis not present

## 2023-09-21 DIAGNOSIS — L97519 Non-pressure chronic ulcer of other part of right foot with unspecified severity: Secondary | ICD-10-CM | POA: Diagnosis not present

## 2023-09-21 DIAGNOSIS — Z515 Encounter for palliative care: Secondary | ICD-10-CM | POA: Diagnosis not present

## 2023-09-21 MED ORDER — LORAZEPAM 2 MG/ML IJ SOLN
2.0000 mg | Freq: Once | INTRAMUSCULAR | Status: AC
  Administered 2023-09-21: 2 mg via INTRAVENOUS
  Filled 2023-09-21: qty 1

## 2023-09-21 NOTE — Care Management Important Message (Signed)
Important Message  Patient Details  Name: Jack [name] MRN: [id number] Date of Birth: May 15, 1946   Important Message Given:  Yes - Medicare IM     Renie Ora 09/21/2023, 10:18 AM

## 2023-09-21 NOTE — Progress Notes (Signed)
Nutrition Follow-up  DOCUMENTATION CODES:   Non-severe (moderate) malnutrition in context of chronic illness  INTERVENTION:  Recommend Vitamin B12 supplementation Request updated measured/daily weights   NUTRITION DIAGNOSIS:   Moderate Malnutrition related to chronic illness, social / environmental circumstances as evidenced by moderate muscle depletion, mild fat depletion, moderate fat depletion.  ***  GOAL:   Patient will meet greater than or equal to 90% of their needs  ***  MONITOR:   PO intake, Supplement acceptance, Labs, Weight trends, Skin  REASON FOR ASSESSMENT:   Consult Wound healing  ASSESSMENT:   78 y/o male admitted with foot ulcer with PAD. Recent admit on 1/9 to [facility] with bilateral leg infections associated with not removing socks for extremely long period of time.  Underwent I & D and treated with antibiotics.  Wound cultures grew MRSA. PMH: HTN, CAD S/P CABG, HLD, bradycardia, kidney stones, HTN, tobacco use, cholecystectomy.  1/17 transfer to [facility] from OSH for vascular assessment of LE wounds 1/18 ABI- consistent with severe bilateral arterial LE disease 1/20 SLP upgraded diet to regular, thin liquids 1/24 s/p angiogram, recommendation for L AKA  Vascular planning for possible MRI R heel to evaluate for osteomyelitis.   Unfortunately there is limited meal completions to review on file. Only 4 meals documented throughout admission averaging 69% (1/24-1/26).  Only documented weight on file is 91.6 kg on 1/18. Will request daily weights to monitor changes throughout admission.   Medications: vitamin c 250mg  BID, zinc 220mg  daily (started 1/20)  Labs:  Sodium 132 Vitamin B12 207 - recommend supplementation as this lab is typically significantly elevated within acute care setting  Diet Order:   Diet Order             Diet regular Room service appropriate? Yes; Fluid consistency: Thin  Diet effective now                   EDUCATION NEEDS:    Education needs have been addressed  Skin:  Skin Assessment: Skin Integrity Issues: Skin Integrity Issues:: Stage II, Unstageable, Other (Comment) Stage I: sacrum Stage II: L buttock Unstageable: R heel Other: bilateral pretibial venous stasis ulcers  Last BM:  1/26 x 2 type 6 medium/smear  Height:   Ht Readings from Last 1 Encounters:  09/12/23 5\' 10"  (1.778 m)    Weight:   Wt Readings from Last 1 Encounters:  09/12/23 91.6 kg    Ideal Body Weight:  75.5 kg  BMI:  Body mass index is 28.98 kg/m.  Estimated Nutritional Needs:   Kcal:  1900-2150 kcal/day  Protein:  98-113 gm/day  Fluid:  1900-2150 mL/day  Drusilla Kanner, RDN, LDN Clinical Nutrition

## 2023-09-21 NOTE — Progress Notes (Signed)
I called and spoke with the son today regarding plans moving forward for Jack [name].  They state family is coming this evening and will talk further with their father.  Discussed that he is very debilitated and I have not seen him out of bed.  Discussed option of bilateral above-knee amputations, palliative wound care, left above-knee amputation with right leg complex revascularization.  I do not necessarily have any confidence that he will be out of bed and active and I think right leg revascularization would probably be difficult for him and his deconditioned state especially with a left leg amputation.  Jack Shelling, MD [facility] [place] Office: [phone]   Jack [name]

## 2023-09-21 NOTE — Progress Notes (Signed)
Physical Therapy Treatment Patient Details Name: Jack [name] MRN: [id number] DOB: 1946-08-04 Today's Date: 09/21/2023   History of Present Illness 78 y.o. male presents to [name] from [name] 09/11/23 for vascular surgery consult/intervention for B LE. Pt admitted w/ deep B heel ulcers w/ eschar. LE angiogram scheduled for 1/24. Palliative following. PMHx: HTN, CA s/p CABG, HLD.    PT Comments  Pt received in bed, does not want to mobilize but agreeable with encouragement. Pt reports that he has not ambulated in months. Mod A +2 needed to come to sitting EOB. Worked on increasing sitting tolerance, sitting balance, and LE there ex in sitting. Also attempted scooting fwd/ bkwd/ lat in sitting but pt unable to unwt buttocks with pushing through LE's. Needed mod A +2 to scoot. Patient will benefit from continued inpatient follow up therapy, <3 hours/day. PT will continue to follow.     If plan is discharge home, recommend the following: A lot of help with bathing/dressing/bathroom;Assistance with cooking/housework;Assist for transportation;Help with stairs or ramp for entrance;Two people to help with walking and/or transfers;Supervision due to cognitive status;Direct supervision/assist for financial management;Direct supervision/assist for medications management   Can travel by private vehicle     No  Equipment Recommendations  None recommended by PT (TBD at next venue)    Recommendations for Other Services       Precautions / Restrictions Precautions Precautions: Fall Precaution Comments: Contact precs; B LE wounds, eschar on B heels, frequent loose stools Required Braces or Orthoses: Other Brace Other Brace: prevalon boots; float heels Restrictions Weight Bearing Restrictions Per Provider Order: No Other Position/Activity Restrictions: still full WB allowed per [name] 1/23     Mobility  Bed Mobility Overal bed mobility: Needs Assistance Bed Mobility: Rolling, Sidelying to Sit, Sit to  Supine Rolling: Min assist, Used rails Sidelying to sit: Used rails, +2 for physical assistance, Mod assist   Sit to supine: +2 for physical assistance, Max assist   General bed mobility comments: assistance with trunk to get to EOB and assistance with BLEs to return to supine. Pt tends to lean shoulders to right in bed and LE's to L. Assisted him in straightening and also encouraged self repositioning.    Transfers                   General transfer comment: attempted scooting along EOB and back into bed. Pt needed mod A +2 to move hips. Able to push some with LLE but not enought to unweight buttocks.    Ambulation/Gait               General Gait Details: pt reports he has not been able to ambulate for months   Stairs             Wheelchair Mobility     Tilt Bed    Modified Rankin (Stroke Patients Only)       Balance Overall balance assessment: Needs assistance Sitting-balance support: Bilateral upper extremity supported, Single extremity supported, Feet supported Sitting balance-Leahy Scale: Fair Sitting balance - Comments: pt reports light headedness in sitting, BP stable. Tolerated sitting EOB >10 mins and then initiated lying back down saying he did not feel well                                    Cognition Arousal: Alert Behavior During Therapy: WFL for tasks assessed/performed Overall Cognitive Status: No family/caregiver  present to determine baseline cognitive functioning                                 General Comments: slow processing        Exercises General Exercises - Lower Extremity Ankle Circles/Pumps: AROM, Both, 5 reps, Seated Long Arc Quad: AAROM, AROM, Both, 10 reps, Seated, Limitations Long Arc Quad Limitations: pt unable to extend L knee against gravity, AAROM on L    General Comments General comments (skin integrity, edema, etc.): VSS on RA      Pertinent Vitals/Pain Pain Assessment Pain  Assessment: Faces Faces Pain Scale: Hurts a little bit Pain Location: heels and bottom Pain Descriptors / Indicators: Grimacing, Guarding Pain Intervention(s): Limited activity within patient's tolerance, Monitored during session, Repositioned    Home Living                          Prior Function            PT Goals (current goals can now be found in the care plan section) Acute Rehab PT Goals Patient Stated Goal: go back home PT Goal Formulation: Patient unable to participate in goal setting Time For Goal Achievement: 09/27/23 Potential to Achieve Goals: Fair Progress towards PT goals: Not progressing toward goals - comment (decreased stamina)    Frequency    Min 1X/week      PT Plan      [name]-evaluation              AM-PAC PT "6 Clicks" Mobility   Outcome Measure  Help needed turning from your back to your side while in a flat bed without using bedrails?: A Lot Help needed moving from lying on your back to sitting on the side of a flat bed without using bedrails?: A Lot Help needed moving to and from a bed to a chair (including a wheelchair)?: Total Help needed standing up from a chair using your arms (e.g., wheelchair or bedside chair)?: Total Help needed to walk in [name] room?: Total Help needed climbing 3-5 steps with a railing? : Total 6 Click Score: 8    End of Session   Activity Tolerance: Patient limited by fatigue Patient left: in bed;with call bell/phone within reach Nurse Communication: Mobility status;Need for lift equipment PT Visit Diagnosis: Other abnormalities of gait and mobility (R26.89);Muscle weakness (generalized) (M62.81);Difficulty in walking, not elsewhere classified (R26.2);Adult, failure to thrive (R62.7)     Time: 1143-1203 PT Time Calculation (min) (ACUTE ONLY): 20 min  Charges:    $Therapeutic Activity: 8-22 mins PT General Charges $$ ACUTE PT VISIT: 1 Visit                     Jack [name], PT  Acute  Rehab Services Secure chat preferred Office [phone]    Jack [name] 09/21/2023, 12:15 PM

## 2023-09-21 NOTE — Progress Notes (Addendum)
  Progress Note    09/21/2023 8:18 AM 3 Days Post-Op  Subjective:  says he feels okay. He would be open to trying an MRI again if pre-medicated    Vitals:   09/20/23 2303 09/21/23 0330  BP: (!) 144/79 114/66  Pulse: 91 96  Resp: 18 18  Temp: 98 F (36.7 C) 98.6 F (37 C)  SpO2: 98% 93%    Physical Exam: General:  laying in bed, NAD Lungs:  nonlabored Extremities:  BLE bandaged Neuro: alert and oriented x3  CBC    Component Value Date/Time   WBC 9.3 09/16/2023 0550   RBC 3.61 (L) 09/16/2023 0550   HGB 11.4 (L) 09/16/2023 0550   HCT 35.4 (L) 09/16/2023 0550   PLT 329 09/16/2023 0550   MCV 98.1 09/16/2023 0550   MCH 31.6 09/16/2023 0550   MCHC 32.2 09/16/2023 0550   RDW 14.9 09/16/2023 0550   LYMPHSABS 1.9 09/12/2023 0128   MONOABS 0.7 09/12/2023 0128   EOSABS 0.1 09/12/2023 0128   BASOSABS 0.1 09/12/2023 0128    BMET    Component Value Date/Time   NA 132 (L) 09/16/2023 0550   K 4.5 09/16/2023 0550   CL 99 09/16/2023 0550   CO2 25 09/16/2023 0550   GLUCOSE 107 (H) 09/16/2023 0550   BUN 15 09/16/2023 0550   CREATININE 1.02 09/16/2023 0550   CREATININE 1.04 08/07/2017 1012   CALCIUM 8.2 (L) 09/16/2023 0550   GFRNONAA >60 09/16/2023 0550   GFRAA >60 01/23/2018 0856    INR    Component Value Date/Time   INR 1.06 01/23/2018 0856     Intake/Output Summary (Last 24 hours) at 09/21/2023 0818 Last data filed at 09/20/2023 1927 Gross per 24 hour  Intake 120 ml  Output 900 ml  Net -780 ml      Assessment/Plan:  78 y.o. male with BLE wounds   -Tentative plan for AKA on the left for extensive tissue loss -R foot X ray yesterday suspicious for osteo in the heel. We will try to obtain a pre-medicated MRI of the right foot today to confirm osteo. Patient would not be a good candidate for bypass on the right if MRI is positive for osteo -Overall he remains at high risk for AKAs bilaterally given his extensive tissue loss and clinical picture   Loel Dubonnet, PA-C [facility] [phone] 09/21/2023 8:18 AM   I have seen and evaluated the patient. I agree with the PA note as documented above.  States he does not feel well today.  He has not made any decisions about surgery.  I previously discussed he needs a left AKA and if there is any leg to save it would be the right leg.  That being said he did not get his MRI over the weekend.  Will order premedicated MRI as the x-ray suggest he has osteomyelitis in the heel.  If there is osteo of the heel I do not think there is any expectation his right leg would ever heal even with pulsatile inflow.  Appreciate palliative care ongoing discussion.  Difficult situation.  [name], MD [facility] [place] Office: [phone]

## 2023-09-21 NOTE — Progress Notes (Signed)
Palliative:  ***  Yong Channel, NP Palliative Medicine Team Pager [phone] (Please see [email or web address] for schedule) Team Phone [phone]

## 2023-09-21 NOTE — Progress Notes (Signed)
PROGRESS NOTE    Jack [name]  NWG:[id number] DOB: 09/11/1945 DOA: 09/11/2023 PCP: Toma Deiters, MD     Brief Narrative:  Jack [name] is a 78 year old with a history of HTN, CAD status post CABG, and HLD who admits to not taking his usual medications or caring for himself appropriately since the death of his wife in 05-Oct-2015.  He was admitted to [facility] 09/03/2023 for evaluation of what was felt to be bilateral lower extremity cellulitis with ulcerations.  He underwent I&D at that facility and was treated with empiric antibiotics.  Wound cultures grew MRSA sensitive to doxycycline.  ABIs of the right leg were noted to be significantly diminished and it was felt the patient would likely require vascular intervention for wound healing.  Attempts were made to transfer him starting 1/13 but due to limited bed availability he did not arrive at [facility] until late night 09/11/2023.  On his arrival at [facility] the patient has noted to have extensive wounds to both lower extremities but with no evidence of acute infection at this time.  Most prominent are deep bilateral heel ulcers with a thick overlying eschar.  New events last 24 hours / Subjective: He is about to start working with physical therapy.  States that he has not stood up on his feet or walk in many months.  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  Recommendation is for  left AKA.  If right heel MRI is negative for osteomyelitis, could consider right femoral endarterectomy with bypass if desired. 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 (Active)  09/11/23 2350  Location: Sacrum  Location Orientation: Anterior;Mid  Staging: Stage 1 -  Intact skin with non-blanchable redness of a localized area usually over a bony prominence.  Wound Description (Comments):   Present on Admission: Yes  Dressing Type Foam - Lift dressing to assess site every shift;Gauze (Comment) 09/20/23 0749     Pressure Injury 09/18/23 Buttocks Left Stage 2 -  Partial thickness loss of dermis presenting as a shallow open injury with a red, pink wound bed without slough. (Active)  09/18/23 2200  Location: Buttocks  Location Orientation: Left  Staging: Stage 2 -  Partial  thickness loss of dermis presenting as a shallow open injury with a red, pink wound bed without slough.  Wound Description (Comments):   Present on Admission:   Dressing Type Foam - Lift dressing to assess site every shift;Gauze (Comment) 09/20/23 0749     Pressure Injury 09/19/23 Heel Right Unstageable - Full thickness tissue loss in which the base of the injury is covered by slough (yellow, tan, gray,  green or brown) and/or eschar (tan, brown or black) in the wound bed. (Active)  09/19/23 1736  Location: Heel  Location Orientation: Right  Staging: Unstageable - Full thickness tissue loss in which the base of the injury is covered by slough (yellow, tan, gray, green or brown) and/or eschar (tan, brown or black) in the wound bed.  Wound Description (Comments):   Present on Admission: Yes  Dressing Type Foam - Lift dressing to assess site every shift;Other (Comment);Gauze (Comment) 09/20/23 1713     Nutrition Problem: Moderate Malnutrition Etiology: chronic illness, social / environmental circumstances   DVT prophylaxis:  enoxaparin (LOVENOX) injection 40 mg Start: 09/12/23 1600  Code Status: DNR Family Communication: None at bedside this morning Disposition Plan: TBD Status is: Inpatient Remains inpatient appropriate because: Pending vascular surgery intervention   Antimicrobials:  Anti-infectives (From admission, onward)    Start     Dose/Rate Route Frequency Ordered Stop   09/12/23 0400  Vancomycin (VANCOCIN) 1,500 mg in sodium chloride 0.9 % 500 mL IVPB  Status:  Discontinued        1,500 mg 250 mL/hr over 120 Minutes Intravenous Every 24 hours 09/12/23 0244 09/12/23 1801   09/12/23 0300  vancomycin (VANCOREADY) IVPB 2000 mg/400 mL  Status:  Discontinued        2,000 mg 200 mL/hr over 120 Minutes Intravenous  Once 09/12/23 0142 09/12/23 0218   09/12/23 0200  cefTRIAXone (ROCEPHIN) 2 g in sodium chloride 0.9 % 100 mL IVPB  Status:  Discontinued        2 g 200 mL/hr over 30 Minutes Intravenous Every 24 hours 09/12/23 0127 09/12/23 1801   09/12/23 0200  metroNIDAZOLE (FLAGYL) IVPB 500 mg  Status:  Discontinued        500 mg 100 mL/hr over 60 Minutes Intravenous Every 12 hours 09/12/23 0127 09/12/23 1801        Objective: Vitals:   09/20/23 1927 09/20/23 2303 09/21/23 0330 09/21/23 0844  BP: (!) 147/72 (!) 144/79 114/66 (!) 150/92  Pulse: 94 91 96 92  Resp: 18 18 18     Temp: 98.3 F (36.8 C) 98 F (36.7 C) 98.6 F (37 C) 98.3 F (36.8 C)  TempSrc: Oral Oral Oral Oral  SpO2: 96% 98% 93% 94%  Weight:      Height:        Intake/Output Summary (Last 24 hours) at 09/21/2023 1218 Last data filed at 09/21/2023 0846 Gross per 24 hour  Intake 120 ml  Output 1800 ml  Net -1680 ml    Filed Weights   09/12/23 0044  Weight: 91.6 kg    Examination:  General exam: Appears calm and comfortable   Data Reviewed: I have personally reviewed following labs and imaging studies  CBC: Recent Labs  Lab 09/16/23 0550  WBC 9.3  HGB 11.4*  HCT 35.4*  MCV 98.1  PLT 329   Basic Metabolic Panel: Recent Labs  Lab 09/16/23 0550  NA 132*  K 4.5  CL 99  CO2 25  GLUCOSE 107*  BUN 15  CREATININE 1.02  CALCIUM  8.2*   GFR: Estimated Creatinine Clearance: 69 mL/min (by C-G formula based on SCr of 1.02 mg/dL). Liver Function Tests: No results for input(s): "AST", "ALT", "ALKPHOS", "BILITOT", "PROT", "ALBUMIN" in the last 168 hours.  No results for input(s): "LIPASE", "AMYLASE" in the last 168 hours. No results for input(s): "AMMONIA" in the last 168 hours.  Coagulation Profile: No results for input(s): "INR", "PROTIME" in the last 168 hours. Cardiac Enzymes: No results for input(s): "CKTOTAL", "CKMB", "CKMBINDEX", "TROPONINI" in the last 168 hours. BNP (last 3 results) No results for input(s): "PROBNP" in the last 8760 hours. HbA1C: No results for input(s): "HGBA1C" in the last 72 hours. CBG: No results for input(s): "GLUCAP" in the last 168 hours. Lipid Profile: No results for input(s): "CHOL", "HDL", "LDLCALC", "TRIG", "CHOLHDL", "LDLDIRECT" in the last 72 hours. Thyroid Function Tests: No results for input(s): "TSH", "T4TOTAL", "FREET4", "T3FREE", "THYROIDAB" in the last 72 hours. Anemia Panel: No results for input(s): "VITAMINB12", "FOLATE", "FERRITIN", "TIBC", "IRON", "RETICCTPCT" in the last 72 hours. Sepsis Labs: No results for input(s):  "PROCALCITON", "LATICACIDVEN" in the last 168 hours.  Recent Results (from the past 240 hours)  Surgical PCR screen     Status: None   Collection Time: 09/12/23  3:34 AM   Specimen: Nasal Mucosa; Nasal Swab  Result Value Ref Range Status   MRSA, PCR NEGATIVE NEGATIVE Final   Staphylococcus aureus NEGATIVE NEGATIVE Final    Comment: (NOTE) The Xpert SA Assay (FDA approved for NASAL specimens in patients 71 years of age and older), is one component of a comprehensive surveillance program. It is not intended to diagnose infection nor to guide or monitor treatment. Performed at [facility], [address], [place], [place] [address]       Radiology Studies: DG Foot Complete Right Result Date: 09/20/2023 CLINICAL DATA:  Osteomyelitis. EXAM: RIGHT FOOT COMPLETE - 3+ VIEW COMPARISON:  Radiograph 09/03/2023 FINDINGS: Hammertoe deformity of the toes. Bony under mineralization. Slightly decreased density involving the plantar calcaneus with likely overlying wound. No frank erosive changes. Midfoot osteoarthritis with dorsal spurring. No soft tissue gas or radiopaque foreign body IMPRESSION: Slightly decreased density involving the plantar calcaneus suspicious for osteomyelitis. Electronically Signed   By: Narda Rutherford M.D.   On: 09/20/2023 15:33      Scheduled Meds:  ascorbic acid  250 mg Oral BID   aspirin EC  81 mg Oral Daily   enoxaparin (LOVENOX) injection  40 mg Subcutaneous Q24H   feeding supplement  237 mL Oral BID BM   LORazepam  2 mg Intravenous Once   rosuvastatin  20 mg Oral Daily   sodium chloride flush  3 mL Intravenous Q12H   zinc sulfate (50mg  elemental zinc)  220 mg Oral Daily   Continuous Infusions:     LOS: 10 days   Time spent: 20 minutes   Noralee Stain, DO [facility] 09/21/2023, 12:18 PM   Available via Epic secure chat 7am-7pm After these hours, please refer to coverage provider listed on [email or web address]

## 2023-09-22 ENCOUNTER — Encounter (HOSPITAL_COMMUNITY): Payer: Self-pay | Admitting: Internal Medicine

## 2023-09-22 DIAGNOSIS — I70235 Atherosclerosis of native arteries of right leg with ulceration of other part of foot: Secondary | ICD-10-CM | POA: Diagnosis not present

## 2023-09-22 DIAGNOSIS — Z515 Encounter for palliative care: Secondary | ICD-10-CM | POA: Diagnosis not present

## 2023-09-22 DIAGNOSIS — L97519 Non-pressure chronic ulcer of other part of right foot with unspecified severity: Secondary | ICD-10-CM | POA: Diagnosis not present

## 2023-09-22 DIAGNOSIS — Z7189 Other specified counseling: Secondary | ICD-10-CM | POA: Diagnosis not present

## 2023-09-22 NOTE — Plan of Care (Signed)
  Problem: Education: Goal: Knowledge of General Education information will improve Description: Including pain rating scale, medication(s)/side effects and non-pharmacologic comfort measures Outcome: Progressing   Problem: Health Behavior/Discharge Planning: Goal: Ability to manage health-related needs will improve Outcome: Progressing   Problem: Clinical Measurements: Goal: Ability to maintain clinical measurements within normal limits will improve Outcome: Progressing Goal: Will remain free from infection Outcome: Progressing Goal: Diagnostic test results will improve Outcome: Progressing Goal: Respiratory complications will improve Outcome: Progressing Goal: Cardiovascular complication will be avoided Outcome: Progressing   Problem: Activity: Goal: Risk for activity intolerance will decrease Outcome: Progressing   Problem: Nutrition: Goal: Adequate nutrition will be maintained Outcome: Progressing   Problem: Coping: Goal: Level of anxiety will decrease Outcome: Progressing   Problem: Elimination: Goal: Will not experience complications related to bowel motility Outcome: Progressing Goal: Will not experience complications related to urinary retention Outcome: Progressing   Problem: Pain Managment: Goal: General experience of comfort will improve and/or be controlled Outcome: Progressing   Problem: Safety: Goal: Ability to remain free from injury will improve Outcome: Progressing   Problem: Skin Integrity: Goal: Risk for impaired skin integrity will decrease Outcome: Progressing   Problem: Education: Goal: Understanding of CV disease, CV risk reduction, and recovery process will improve Outcome: Progressing Goal: Individualized Educational Video(s) Outcome: Progressing   Problem: Activity: Goal: Ability to return to baseline activity level will improve Outcome: Progressing   Problem: Cardiovascular: Goal: Ability to achieve and maintain adequate  cardiovascular perfusion will improve Outcome: Not Progressing Goal: Vascular access site(s) Level 0-1 will be maintained Outcome: Progressing   Problem: Health Behavior/Discharge Planning: Goal: Ability to safely manage health-related needs after discharge will improve Outcome: Progressing

## 2023-09-22 NOTE — Progress Notes (Signed)
PROGRESS NOTE    KEIGEN CADDELL  UEA:[id number] DOB: 26-Nov-1945 DOA: 09/11/2023 PCP: Toma Deiters, MD     Brief Narrative:  Jack [name] is a 78 year old with a history of HTN, CAD status post CABG, and HLD who admits to not taking his usual medications or caring for himself appropriately since the death of his wife in 10-24-15.  He was admitted to [facility] 09/03/2023 for evaluation of what was felt to be bilateral lower extremity cellulitis with ulcerations.  He underwent I&D at that facility and was treated with empiric antibiotics.  Wound cultures grew MRSA sensitive to doxycycline.  ABIs of the right leg were noted to be significantly diminished and it was felt the patient would likely require vascular intervention for wound healing.  Attempts were made to transfer him starting 1/13 but due to limited bed availability he did not arrive at [facility] until late night 09/11/2023.  On his arrival at [facility] the patient has noted to have extensive wounds to both lower extremities but with no evidence of acute infection at this time.  Most prominent are deep bilateral heel ulcers with a thick overlying eschar.  Continued discussion regarding vascular surgery recommendations on palliative wound care versus bilateral AKA.   New events last 24 hours / Subjective: Patient states that he "wants to feel better".  When asked to clarify what this means to him, he states that "we will just need to get together and decide to get on the same page".  He seems open to the idea of amputation today.   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  Recommendation is for left AKA.  If right heel MRI is negative for osteomyelitis, could consider right femoral endarterectomy with bypass if desired. 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 (Active)  09/11/23 2350  Location: Sacrum  Location Orientation: Anterior;Mid  Staging: Stage 1 -  Intact skin with non-blanchable redness of a localized area usually over a bony prominence.  Wound Description (Comments):   Present on Admission: Yes  Dressing Type Foam - Lift dressing to  assess site every shift 09/22/23 0800     Pressure Injury 09/18/23 Buttocks Left Stage 2 -  Partial thickness loss of dermis presenting as a shallow open injury with a red, pink wound bed without slough. (Active)  09/18/23 2200  Location: Buttocks  Location Orientation: Left  Staging: Stage 2 -  Partial thickness loss of dermis presenting as a shallow open  injury with a red, pink wound bed without slough.  Wound Description (Comments):   Present on Admission:   Dressing Type Foam - Lift dressing to assess site every shift 09/22/23 0800     Pressure Injury 09/19/23 Heel Right Unstageable - Full thickness tissue loss in which the base of the injury is covered by slough (yellow, tan, gray, green or brown) and/or eschar (tan, brown or black) in the wound bed. (Active)  09/19/23 1736  Location: Heel  Location Orientation: Right  Staging: Unstageable - Full thickness tissue loss in which the base of the injury is covered by slough (yellow, tan, gray, green or brown) and/or eschar (tan, brown or black) in the wound bed.  Wound Description (Comments):   Present on Admission: Yes  Dressing Type Foam - Lift dressing to assess site every shift 09/22/23 0800     Nutrition Problem: Moderate Malnutrition Etiology: chronic illness, social / environmental circumstances   DVT prophylaxis:  enoxaparin (LOVENOX) injection 40 mg Start: 09/12/23 1600  Code Status: DNR Family Communication: None at bedside this morning Disposition Plan: TBD Status is: Inpatient Remains inpatient appropriate because: Pending palliative care medicine discussion with family to make a decision regarding his bilateral lower extremity wounds   Antimicrobials:  Anti-infectives (From admission, onward)    Start     Dose/Rate Route Frequency Ordered Stop   09/12/23 0400  Vancomycin (VANCOCIN) 1,500 mg in sodium chloride 0.9 % 500 mL IVPB  Status:  Discontinued        1,500 mg 250 mL/hr over 120 Minutes Intravenous Every 24 hours 09/12/23 0244 09/12/23 1801   09/12/23 0300  vancomycin (VANCOREADY) IVPB 2000 mg/400 mL  Status:  Discontinued        2,000 mg 200 mL/hr over 120 Minutes Intravenous  Once 09/12/23 0142 09/12/23 0218   09/12/23 0200  cefTRIAXone (ROCEPHIN) 2 g in sodium chloride 0.9 % 100 mL IVPB  Status:  Discontinued        2 g 200 mL/hr over 30 Minutes  Intravenous Every 24 hours 09/12/23 0127 09/12/23 1801   09/12/23 0200  metroNIDAZOLE (FLAGYL) IVPB 500 mg  Status:  Discontinued        500 mg 100 mL/hr over 60 Minutes Intravenous Every 12 hours 09/12/23 0127 09/12/23 1801        Objective: Vitals:   09/21/23 1919 09/21/23 2306 09/22/23 0337 09/22/23 0830  BP: (!) 158/79 (!) 108/48 (!) 151/73 132/69  Pulse: 100 97 100 92  Resp:  16 16   Temp: 99.2 F (37.3 C) 98.4 F (36.9 C) 97.8 F (36.6 C)   TempSrc: Oral Oral Oral   SpO2: 92% 96% 94%   Weight:      Height:        Intake/Output Summary (Last 24 hours) at 09/22/2023 1121 Last data filed at 09/22/2023 0858 Gross per 24 hour  Intake 243 ml  Output 1100 ml  Net -857 ml    Filed Weights   09/12/23 0044  Weight: 91.6 kg    Examination:  General exam: Appears calm and comfortable   Data Reviewed: I have personally  reviewed following labs and imaging studies  CBC: Recent Labs  Lab 09/16/23 0550  WBC 9.3  HGB 11.4*  HCT 35.4*  MCV 98.1  PLT 329   Basic Metabolic Panel: Recent Labs  Lab 09/16/23 0550  NA 132*  K 4.5  CL 99  CO2 25  GLUCOSE 107*  BUN 15  CREATININE 1.02  CALCIUM 8.2*   GFR: Estimated Creatinine Clearance: 69 mL/min (by C-G formula based on SCr of 1.02 mg/dL). Liver Function Tests: No results for input(s): "AST", "ALT", "ALKPHOS", "BILITOT", "PROT", "ALBUMIN" in the last 168 hours.  No results for input(s): "LIPASE", "AMYLASE" in the last 168 hours. No results for input(s): "AMMONIA" in the last 168 hours.  Coagulation Profile: No results for input(s): "INR", "PROTIME" in the last 168 hours. Cardiac Enzymes: No results for input(s): "CKTOTAL", "CKMB", "CKMBINDEX", "TROPONINI" in the last 168 hours. BNP (last 3 results) No results for input(s): "PROBNP" in the last 8760 hours. HbA1C: No results for input(s): "HGBA1C" in the last 72 hours. CBG: No results for input(s): "GLUCAP" in the last 168 hours. Lipid Profile: No results  for input(s): "CHOL", "HDL", "LDLCALC", "TRIG", "CHOLHDL", "LDLDIRECT" in the last 72 hours. Thyroid Function Tests: No results for input(s): "TSH", "T4TOTAL", "FREET4", "T3FREE", "THYROIDAB" in the last 72 hours. Anemia Panel: No results for input(s): "VITAMINB12", "FOLATE", "FERRITIN", "TIBC", "IRON", "RETICCTPCT" in the last 72 hours. Sepsis Labs: No results for input(s): "PROCALCITON", "LATICACIDVEN" in the last 168 hours.  No results found for this or any previous visit (from the past 240 hours).     Radiology Studies: DG Foot Complete Right Result Date: 09/20/2023 CLINICAL DATA:  Osteomyelitis. EXAM: RIGHT FOOT COMPLETE - 3+ VIEW COMPARISON:  Radiograph 09/03/2023 FINDINGS: Hammertoe deformity of the toes. Bony under mineralization. Slightly decreased density involving the plantar calcaneus with likely overlying wound. No frank erosive changes. Midfoot osteoarthritis with dorsal spurring. No soft tissue gas or radiopaque foreign body IMPRESSION: Slightly decreased density involving the plantar calcaneus suspicious for osteomyelitis. Electronically Signed   By: Narda Rutherford M.D.   On: 09/20/2023 15:33      Scheduled Meds:  ascorbic acid  250 mg Oral BID   aspirin EC  81 mg Oral Daily   enoxaparin (LOVENOX) injection  40 mg Subcutaneous Q24H   feeding supplement  237 mL Oral BID BM   rosuvastatin  20 mg Oral Daily   sodium chloride flush  3 mL Intravenous Q12H   zinc sulfate (50mg  elemental zinc)  220 mg Oral Daily   Continuous Infusions:     LOS: 11 days   Time spent: 20 minutes   Noralee Stain, DO [facility] 09/22/2023, 11:21 AM   Available via Epic secure chat 7am-7pm After these hours, please refer to coverage provider listed on [email or web address]

## 2023-09-22 NOTE — Progress Notes (Signed)
Occupational Therapy Treatment Patient Details Name: Jack [name] MRN: [id number] DOB: 1946/06/17 Today's Date: 09/22/2023   History of present illness 78 y.o. male presents to [facility] from [facility] 09/11/23 for vascular surgery consult/intervention for B LE. Pt admitted w/ deep B heel ulcers w/ eschar. LE angiogram scheduled for 1/24. Palliative following. PMHx: HTN, CA s/p CABG, HLD.   OT comments  Pt with slow progression toward established OT goals this session secondary to reports of fatigue and pt deferring EOB this session; however, agreeable to bed level ADL and exercise. Pt able to pull self up in bed with BUE and bed in trendelenburg; educated regarding weight shifting to reduce likelihood of pressure injuries. Pt demo understanding. Engaged in bed level ADL with set-up. BUE and BLE exercises with pt self initiating rest breaks frequently (every ~5 reps) and notably increased work of breathing but VSS. Patient will benefit from continued inpatient follow up therapy, <3 hours/day       If plan is discharge home, recommend the following:  Two people to help with walking and/or transfers;Two people to help with bathing/dressing/bathroom;Assistance with cooking/housework;Assist for transportation;Help with stairs or ramp for entrance;Direct supervision/assist for financial management;Direct supervision/assist for medications management   Equipment Recommendations  Other (comment) (defer to next venue of care)    Recommendations for Other Services      Precautions / Restrictions Precautions Precautions: Fall Precaution Comments: Contact precs; B LE wounds, eschar on B heels, frequent loose stools Required Braces or Orthoses: Other Brace Other Brace: prevalon boots; float heels Restrictions Weight Bearing Restrictions Per Provider Order: No Other Position/Activity Restrictions: still full WB allowed per Dr Alvino Chapel 1/23       Mobility Bed Mobility Overal bed mobility: Needs Assistance Bed  Mobility: Rolling Rolling: Min assist, Used rails         General bed mobility comments: pt max deferred EOB today reporting he is too fatigued but was agreeable to bed level exercises. Pt educated regarding repositioning self in bed and offloading weight frequenly throughout day off sacrum with use of BUE to pull on bed rail and shift weight from one hip to the other or from sacrum to hip.    Transfers                         Balance Overall balance assessment: Needs assistance                                         ADL either performed or assessed with clinical judgement   ADL Overall ADL's : Needs assistance/impaired Eating/Feeding: Set up;Bed level Eating/Feeding Details (indicate cue type and reason): Repositioned in bed and eating graham crackers/drinking from cup with straw on departure Grooming: Wash/dry hands;Wash/dry face;Oral care;Set up;Bed level Grooming Details (indicate cue type and reason): chair position in bed                                    Extremity/Trunk Assessment Upper Extremity Assessment Upper Extremity Assessment: Generalized weakness   Lower Extremity Assessment Lower Extremity Assessment: Defer to PT evaluation        Vision       Perception     Praxis      Cognition Arousal: Alert Behavior During Therapy: [facility] for tasks assessed/performed Overall Cognitive Status:  No family/caregiver present to determine baseline cognitive functioning                                 General Comments: slow processing; good recall of medical plans stating "am I still supposed to get my MRI today" etc. Pt seems to understand benefits medically of potential amputation.        Exercises Exercises: General Lower Extremity, Other exercises General Exercises - Lower Extremity Ankle Circles/Pumps: AROM, Both, 10 reps, Supine Quad Sets: AROM, Both, 5 reps, Supine Straight Leg Raises: AAROM, Both, 5  reps, Supine Other Exercises Other Exercises: BUE shoulder flexion x15 and forward punches x15 needing rest beak between preps and sets with signifciantly decreased conditioning noted    Shoulder Instructions       General Comments VSS on RA    Pertinent Vitals/ Pain       Pain Assessment Pain Assessment: Faces Faces Pain Scale: Hurts a little bit Pain Location: heels and bottom Pain Descriptors / Indicators: Grimacing, Guarding Pain Intervention(s): Limited activity within patient's tolerance, Monitored during session  Home Living                                          Prior Functioning/Environment              Frequency  Min 1X/week        Progress Toward Goals  OT Goals(current goals can now be found in the care plan section)  Progress towards OT goals: Progressing toward goals  Acute Rehab OT Goals Patient Stated Goal: get better OT Goal Formulation: With patient Time For Goal Achievement: 09/27/23 Potential to Achieve Goals: Fair ADL Goals Pt Will Perform Eating: with supervision;sitting Pt Will Perform Grooming: with supervision;sitting Pt Will Perform Upper Body Bathing: with set-up;sitting Pt Will Perform Lower Body Bathing: with min assist;sitting/lateral leans Pt Will Transfer to Toilet: with max assist;with +2 assist;with transfer board Additional ADL Goal #1: Pt will perform bed mobility with mod A as a precursor to ADL  Plan      Co-evaluation                 AM-PAC OT "6 Clicks" Daily Activity     Outcome Measure   Help from another person eating meals?: A Little Help from another person taking care of personal grooming?: A Little Help from another person toileting, which includes using toliet, bedpan, or urinal?: Total Help from another person bathing (including washing, rinsing, drying)?: A Lot Help from another person to put on and taking off regular upper body clothing?: A Little Help from another person to  put on and taking off regular lower body clothing?: Total 6 Click Score: 13    End of Session    OT Visit Diagnosis: Unsteadiness on feet (R26.81);Muscle weakness (generalized) (M62.81);Other symptoms and signs involving cognitive function   Activity Tolerance Patient tolerated treatment well   Patient Left in bed;with call bell/phone within reach;with bed alarm set   Nurse Communication Mobility status        Time: 4098-1191 OT Time Calculation (min): 21 min  Charges: OT General Charges $OT Visit: 1 Visit OT Treatments $Self Care/Home Management : 8-22 mins  Tyler Deis, OTR/L [facility] Acute Rehabilitation Office: [phone]   Myrla Halsted 09/22/2023, 4:20 PM

## 2023-09-22 NOTE — Progress Notes (Signed)
Palliative:  HPI:  78 y.o. male  with past medical history of hypertension, CAD s/p CABG, hyperlipidemia admitted as transfer from [facility] on 09/11/2023 with foot wounds and PAD. Vascular has noted that treatment would involve bilateral AKA.   I came to visit with Jack [name] and he is in a deep sleep. He does not awaken to voice. I did not wake him up at this time. I called son, Jack [name], and reviewed with him decisions ahead. Jack [name] has been discussing with Dr. Chestine Spore. I updated him on my conversation with his brother yesterday. Jack [name] shares that they plan to re-visit with their father this evening to make a decision. Jack [name] reports that he is prepared to help his father return home with more palliative focused care (with help from hospice) if this is his wish. Jack [name] wants his father to be able to make this decision and he will support him regardless of what he decides. Jack [name] has no further questions about the situation or expectations as this has been discussed fully over the past week. Family will meet this evening ~5:30 pm - I will be unable to attend at this time but will follow up with Jack [name] in the morning.   All questions/concerns addressed. Emotional support provided.   Exam: Sleeping soundly. No distress. Breathing regular, unlabored. Abd soft. BLE with dressings in place - less edema than previously.   Plan: - DNR/DNI - Family will meet this evening to make a decision on amputation vs palliative care  25 min  Yong Channel, NP Palliative Medicine Team Pager [phone] (Please see [email or web address] for schedule) Team Phone [phone]

## 2023-09-22 NOTE — Progress Notes (Addendum)
  Progress Note    09/22/2023 7:53 AM 4 Days Post-Op  Subjective:  says he does not feel great. He does not know what decisions he is going to make involving his care    Vitals:   09/21/23 2306 09/22/23 0337  BP: (!) 108/48 (!) 151/73  Pulse: 97 100  Resp: 16 16  Temp: 98.4 F (36.9 C) 97.8 F (36.6 C)  SpO2: 96% 94%    Physical Exam: General:  laying in bed, NAD Lungs:  nonlabored Extremities:  BLE bandaged, somewhat edematous  CBC    Component Value Date/Time   WBC 9.3 09/16/2023 0550   RBC 3.61 (L) 09/16/2023 0550   HGB 11.4 (L) 09/16/2023 0550   HCT 35.4 (L) 09/16/2023 0550   PLT 329 09/16/2023 0550   MCV 98.1 09/16/2023 0550   MCH 31.6 09/16/2023 0550   MCHC 32.2 09/16/2023 0550   RDW 14.9 09/16/2023 0550   LYMPHSABS 1.9 09/12/2023 0128   MONOABS 0.7 09/12/2023 0128   EOSABS 0.1 09/12/2023 0128   BASOSABS 0.1 09/12/2023 0128    BMET    Component Value Date/Time   NA 132 (L) 09/16/2023 0550   K 4.5 09/16/2023 0550   CL 99 09/16/2023 0550   CO2 25 09/16/2023 0550   GLUCOSE 107 (H) 09/16/2023 0550   BUN 15 09/16/2023 0550   CREATININE 1.02 09/16/2023 0550   CREATININE 1.04 08/07/2017 1012   CALCIUM 8.2 (L) 09/16/2023 0550   GFRNONAA >60 09/16/2023 0550   GFRAA >60 01/23/2018 0856    INR    Component Value Date/Time   INR 1.06 01/23/2018 0856     Intake/Output Summary (Last 24 hours) at 09/22/2023 0753 Last data filed at 09/21/2023 1623 Gross per 24 hour  Intake --  Output 1600 ml  Net -1600 ml      Assessment/Plan:  78 y.o. male with BLE wounds   -Right foot Xray was suspicious for heel osteo. We attempted to obtain a pre-medicated MRI yesterday however the patient became combative in the MRI suite. He is unable to tolerate laying still for an MRI  -The likelihood that the patient would heal his right leg appropriately even with complex revascularization is low -He is pending further conversations with family and palliative care. At  this time the patient likely either faces bilateral AKAs or going home with palliative wound care   Loel Dubonnet, PA-C [facility] [phone] 09/22/2023 7:53 AM  I have seen and evaluated the patient. I agree with the PA note as documented above.  Attempted to get right lower extremity premedicated MRI yesterday unsuccessful as patient combative.  I called and spoke with the son yesterday and I think the best way forward is palliative wound care of both lower extremities versus bilateral above-knee amputations.  He is severely deconditioned and spends almost all of his time in bed.  I have no expectation that these will heal.  I do not think he will do well with a complex tibial bypass in the right leg as he has really been unable to tolerate an angiogram or MRI without issues with each procedure.  [name], MD [facility] Office: [phone]

## 2023-09-23 DIAGNOSIS — Z7189 Other specified counseling: Secondary | ICD-10-CM | POA: Diagnosis not present

## 2023-09-23 DIAGNOSIS — L97519 Non-pressure chronic ulcer of other part of right foot with unspecified severity: Secondary | ICD-10-CM | POA: Diagnosis not present

## 2023-09-23 DIAGNOSIS — E440 Moderate protein-calorie malnutrition: Secondary | ICD-10-CM | POA: Diagnosis not present

## 2023-09-23 DIAGNOSIS — L97919 Non-pressure chronic ulcer of unspecified part of right lower leg with unspecified severity: Secondary | ICD-10-CM | POA: Diagnosis not present

## 2023-09-23 DIAGNOSIS — Z515 Encounter for palliative care: Secondary | ICD-10-CM | POA: Diagnosis not present

## 2023-09-23 DIAGNOSIS — L97929 Non-pressure chronic ulcer of unspecified part of left lower leg with unspecified severity: Secondary | ICD-10-CM | POA: Diagnosis not present

## 2023-09-23 DIAGNOSIS — I10 Essential (primary) hypertension: Secondary | ICD-10-CM | POA: Diagnosis not present

## 2023-09-23 DIAGNOSIS — I70235 Atherosclerosis of native arteries of right leg with ulceration of other part of foot: Secondary | ICD-10-CM | POA: Diagnosis not present

## 2023-09-23 LAB — BASIC METABOLIC PANEL
Anion gap: 11 (ref 5–15)
BUN: 15 mg/dL (ref 8–23)
CO2: 26 mmol/L (ref 22–32)
Calcium: 8.5 mg/dL — ABNORMAL LOW (ref 8.9–10.3)
Chloride: 98 mmol/L (ref 98–111)
Creatinine, Ser: 0.98 mg/dL (ref 0.61–1.24)
GFR, Estimated: >60 mL/min (ref >60)
Glucose, Bld: 104 mg/dL — ABNORMAL HIGH (ref 70–99)
Potassium: 4.5 mmol/L (ref 3.5–5.1)
Sodium: 135 mmol/L (ref 135–145)

## 2023-09-23 LAB — CBC
HCT: 36.5 % — ABNORMAL LOW (ref 39.0–52.0)
Hemoglobin: 11.9 g/dL — ABNORMAL LOW (ref 13.0–17.0)
MCH: 32 pg (ref 26.0–34.0)
MCHC: 32.6 g/dL (ref 30.0–36.0)
MCV: 98.1 fL (ref 80.0–100.0)
Platelets: 299 10*3/uL (ref 150–400)
RBC: 3.72 MIL/uL — ABNORMAL LOW (ref 4.22–5.81)
RDW: 15.2 % (ref 11.5–15.5)
WBC: 9.4 10*3/uL (ref 4.0–10.5)
nRBC: 0 % (ref 0.0–0.2)

## 2023-09-23 MED ORDER — ESCITALOPRAM OXALATE 10 MG PO TABS
5.0000 mg | ORAL_TABLET | Freq: Every day | ORAL | Status: DC
  Administered 2023-09-23 – 2023-10-07 (×15): 5 mg via ORAL
  Filled 2023-09-23 (×15): qty 1

## 2023-09-23 MED ORDER — MORPHINE SULFATE (PF) 2 MG/ML IV SOLN
1.0000 mg | INTRAVENOUS | Status: DC | PRN
  Administered 2023-09-23: 1 mg via INTRAVENOUS
  Filled 2023-09-23: qty 1

## 2023-09-23 NOTE — Plan of Care (Signed)
Problem: Clinical Measurements: Goal: Will remain free from infection Outcome: Progressing   Problem: Clinical Measurements: Goal: Diagnostic test results will improve Outcome: Progressing   Problem: Clinical Measurements: Goal: Respiratory complications will improve Outcome: Progressing   Problem: Nutrition: Goal: Adequate nutrition will be maintained Outcome: Progressing   Problem: Coping: Goal: Level of anxiety will decrease Outcome: Progressing

## 2023-09-23 NOTE — Progress Notes (Signed)
PROGRESS NOTE    Jack [name]  OZD:[id number] DOB: 1946-03-29 DOA: 09/11/2023 PCP: Toma Deiters, MD     Brief Narrative:  Jack [name] is a 78 year old with a history of HTN, CAD status post CABG, and HLD who admits to not taking his usual medications or caring for himself appropriately since the death of his wife in 10-18-2015.  He was admitted to [facility] 09/03/2023 for evaluation of what was felt to be bilateral lower extremity cellulitis with ulcerations.  He underwent I&D at that facility and was treated with empiric antibiotics.  Wound cultures grew MRSA sensitive to doxycycline.  ABIs of the right leg were noted to be significantly diminished and it was felt the patient would likely require vascular intervention for wound healing.  Attempts were made to transfer him starting 1/13 but due to limited bed availability he did not arrive at [facility] until late night 09/11/2023.  On his arrival at [facility] the patient has noted to have extensive wounds to both lower extremities but with no evidence of acute infection at this time.  Most prominent are deep bilateral heel ulcers with a thick overlying eschar.  Vascular surgery and palliative care was consulted.  Assessment & Plan:   Bilateral lower extremity ulcerations and deep tissue injury to bilateral heels PAD -ABIs obtained at [facility] 1/18 are consistent with severe right and left lower extremity arterial disease at 0.37 and 0.32 respectively -Status post angiogram 1/24.   -Patient has been unable to tolerate MRI of the right foot/heel -Current recommendations are either palliative wound care of bilateral lower extremities versus bilateral AKA.  Family has had multiple conversations with vascular surgery and palliative care. Plan is to continue with palliative wound care rather than amputation.  He will likely need to go to skilled nursing for rehab and wound care. He has completed course of antibiotics.    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 (Active)  09/11/23 2350  Location: Sacrum  Location Orientation: Anterior;Mid  Staging: Stage 1 -  Intact skin with non-blanchable redness of a localized area usually over a bony prominence.  Wound Description (Comments):   Present on Admission: Yes  Dressing Type Foam - Lift dressing to assess site every shift 09/22/23 2000     Pressure Injury 09/18/23 Buttocks Left Stage 2 -  Partial thickness loss of dermis presenting as a shallow open injury with a red, pink wound bed without slough. (Active)  09/18/23 2200  Location: Buttocks  Location Orientation: Left  Staging: Stage 2 -  Partial thickness loss of dermis presenting as a shallow open injury with a red, pink wound bed without slough.  Wound Description (Comments):   Present on Admission:   Dressing Type Foam - Lift dressing to assess site every shift 09/22/23 2000     Pressure Injury  09/19/23 Heel Right Unstageable - Full thickness tissue loss in which the base of the injury is covered by slough (yellow, tan, gray, green or brown) and/or eschar (tan, brown or black) in the wound bed. (Active)  09/19/23 1736  Location: Heel  Location Orientation: Right  Staging: Unstageable - Full thickness tissue loss in which  the base of the injury is covered by slough (yellow, tan, gray, green or brown) and/or eschar (tan, brown or black) in the wound bed.  Wound Description (Comments):   Present on Admission: Yes  Dressing Type Foam - Lift dressing to assess site every shift 09/22/23 2000   Moderate protein calorie malnutrition Nutrition Problem: Moderate Malnutrition Etiology: chronic illness, social / environmental circumstances   DVT prophylaxis:  enoxaparin (LOVENOX) injection 40 mg Start: 09/12/23 1600  Code Status: DNR Family Communication: No family at bedside Disposition Plan: SNF   Antimicrobials:  Anti-infectives (From admission, onward)    Start     Dose/Rate Route Frequency Ordered Stop   09/12/23 0400  Vancomycin (VANCOCIN) 1,500 mg in sodium chloride 0.9 % 500 mL IVPB  Status:  Discontinued        1,500 mg 250 mL/hr over 120 Minutes Intravenous Every 24 hours 09/12/23 0244 09/12/23 1801   09/12/23 0300  vancomycin (VANCOREADY) IVPB 2000 mg/400 mL  Status:  Discontinued        2,000 mg 200 mL/hr over 120 Minutes Intravenous  Once 09/12/23 0142 09/12/23 0218   09/12/23 0200  cefTRIAXone (ROCEPHIN) 2 g in sodium chloride 0.9 % 100 mL IVPB  Status:  Discontinued        2 g 200 mL/hr over 30 Minutes Intravenous Every 24 hours 09/12/23 0127 09/12/23 1801   09/12/23 0200  metroNIDAZOLE (FLAGYL) IVPB 500 mg  Status:  Discontinued        500 mg 100 mL/hr over 60 Minutes Intravenous Every 12 hours 09/12/23 0127 09/12/23 1801        Objective: Vitals:   09/22/23 1714 09/22/23 2100 09/23/23 0503 09/23/23 0748  BP: (!) 144/64 (!) 155/77 (!) 145/88 127/81  Pulse:  82 77 88  Resp:  18 16   Temp: 98.8 F (37.1 C) 98.3 F (36.8 C) 98.7 F (37.1 C) 98.1 F (36.7 C)  TempSrc: Oral Oral Oral Oral  SpO2:  99% 98% 93%  Weight:      Height:        Intake/Output Summary (Last 24 hours) at 09/23/2023 1120 Last data filed at 09/23/2023 0100 Gross per 24 hour  Intake 720 ml  Output 450 ml   Net 270 ml    Filed Weights   09/12/23 0044  Weight: 91.6 kg    Examination:   General appearance: Awake alert.  In no distress Resp: Clear to auscultation bilaterally.  Normal effort Cardio: S1-S2 is normal regular.  No S3-S4.  No rubs murmurs or bruit GI: Abdomen is soft.  Nontender nondistended.  Bowel sounds are present normal.  No masses organomegaly Both lower extremities are covered in dressing.  Data Reviewed: I have personally reviewed following labs and imaging studies  CBC: Recent Labs  Lab 09/23/23 0344  WBC 9.4  HGB 11.9*  HCT 36.5*  MCV 98.1  PLT 299   Basic Metabolic Panel: Recent Labs  Lab 09/23/23 0344  NA 135  K 4.5  CL 98  CO2 26  GLUCOSE 104*  BUN 15  CREATININE 0.98  CALCIUM 8.5*   GFR: Estimated Creatinine Clearance: 71.8 mL/min (by C-G formula based  on SCr of 0.98 mg/dL).   Radiology Studies: No results found.   Scheduled Meds:  ascorbic acid  250 mg Oral BID   aspirin EC  81 mg Oral Daily   enoxaparin (LOVENOX) injection  40 mg Subcutaneous Q24H   escitalopram  5 mg Oral Daily   feeding supplement  237 mL Oral BID BM   rosuvastatin  20 mg Oral Daily   sodium chloride flush  3 mL Intravenous Q12H   zinc sulfate (50mg  elemental zinc)  220 mg Oral Daily   Continuous Infusions:     LOS: 12 days   Osvaldo Shipper,  [facility] 09/23/2023, 11:20 AM   Available via Epic secure chat 7am-7pm After these hours, please refer to coverage provider listed on [email or web address]

## 2023-09-23 NOTE — TOC Initial Note (Addendum)
Transition of Care [facility]) - Initial/Assessment Note    Patient Details  Name: Jack [name] MRN: [id number] Date of Birth: 1945-11-18  [facility] CM/SW Contact:    Eduard Roux, LCSW Phone Number: 09/23/2023, 3:46 PM  Clinical Narrative:                  CSW spoke with the patient's son, Caryn Bee. CSW introduced self and explained role. CSW informed Caryn Bee, rec'd message from [facility] that family & patient was agreeable to short term rehab at [facility]. He confirmed the plan is for short term rehab. He states he and his spouse plans on possibly moving in with patient after rehab at [facility]. CSW explained th SNF processed. No preferred SNF choice at this time.   TOC  will provide bed offers once available TOC will continue to follow and assist with discharge summary   Antony Blackbird, MSW, LCSW Clinical Social Worker     Expected Discharge Plan: Skilled Nursing Facility Barriers to Discharge: Continued Medical Work up   Patient Goals and CMS Choice            Expected Discharge Plan and Services                                              Prior Living Arrangements/Services                       Activities of Daily Living   ADL Screening (condition at time of admission) Independently performs ADLs?: Yes (appropriate for developmental age) Is the patient deaf or have difficulty hearing?: No Does the patient have difficulty seeing, even when wearing glasses/contacts?: No Does the patient have difficulty concentrating, remembering, or making decisions?: No  Permission Sought/Granted                  Emotional Assessment              Admission diagnosis:  Heel sore [L98.9] 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) [X91.478, L97.519] Patient Active Problem List   Diagnosis Date Noted   Malnutrition of moderate degree 09/14/2023   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) 09/12/2023   CKD stage 3a, GFR  45-59 ml/min (HCC) 09/12/2023   Self neglect 09/12/2023   Ulcers of both lower legs (HCC) 09/12/2023   Primary osteoarthritis of both knees 10/13/2019   Diarrhea 02/03/2018   Guaiac positive stools 02/03/2018   Osteoarthritis of both shoulders 06/09/2017   Lumbar spondylosis 06/09/2017   Lumbar degenerative disc disease 06/09/2017   Chronic pain of both shoulders 06/09/2017   Long term prescription opiate use 06/09/2017   Chronic pain syndrome 06/09/2017   Hypertension    Tobacco abuse 03/08/2010   Essential hypertension, benign 07/11/2009   Hyperlipidemia LDL goal <70 06/02/2009   Coronary atherosclerosis of native coronary artery 06/02/2009   PCP:  Toma Deiters, MD Pharmacy:   [facility] [address], [place] - [address] [place] [address] Phone: [phone] Fax: [phone]     Social Drivers of Health (SDOH) Social History: SDOH Screenings   Food Insecurity: No Food Insecurity (09/11/2023)  Housing: Low Risk  (09/11/2023)  Transportation Needs: No Transportation Needs (09/11/2023)  Utilities: Not At Risk (09/11/2023)  Alcohol Screen: Low Risk  (02/02/2018)  Depression (  PHQ2-9): Low Risk  (04/10/2020)  Financial Resource Strain: Low Risk  (09/04/2023)   Received from [facility]  Social Connections: Socially Isolated (09/11/2023)  Tobacco Use: High Risk (09/22/2023)   SDOH Interventions:     Readmission Risk Interventions     No data to display

## 2023-09-23 NOTE — NC FL2 (Signed)
[place] MEDICAID FL2 LEVEL OF CARE FORM     IDENTIFICATION  Patient Name: Jack [name] Birthdate: Apr 29, 1946 Sex: male Admission Date (Current Location): 09/11/2023  [facility] and [place] Number:  Producer, television/film/video and Address:  [facility], 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) 09/12/2023   CKD stage 3a, GFR 45-59 ml/min (HCC) 09/12/2023   Self neglect 09/12/2023   Ulcers of both lower legs (HCC) 09/12/2023   Primary osteoarthritis of both knees 10/13/2019   Diarrhea 02/03/2018   Guaiac positive stools 02/03/2018   Osteoarthritis of both shoulders 06/09/2017   Lumbar spondylosis 06/09/2017   Lumbar degenerative disc disease 06/09/2017   Chronic pain of both shoulders 06/09/2017   Long term prescription opiate use 06/09/2017   Chronic pain syndrome 06/09/2017   Hypertension    Tobacco abuse 03/08/2010   Essential hypertension, benign 07/11/2009   Hyperlipidemia LDL goal <70 06/02/2009   Coronary atherosclerosis of native coronary artery 06/02/2009    Orientation RESPIRATION BLADDER Height & Weight     Self, Time, Situation, Place  Normal Incontinent Weight: 201 lb 15.1 oz (91.6 kg) Height:  5\' 10"  (177.8 cm)  BEHAVIORAL SYMPTOMS/MOOD NEUROLOGICAL BOWEL NUTRITION STATUS      Incontinent Diet (please see discharge summary)  AMBULATORY STATUS COMMUNICATION OF NEEDS Skin     Verbally Other (Comment) (Pressure injury scarum anterior mid stage,  pressure injury buttocks left stage II, pressure injury right heel unstageable, wound incision venous stasis ulcher, left, venous stasis pretibial right)                       Personal Care Assistance Level of Assistance  Bathing, Feeding, Dressing Bathing Assistance: Maximum assistance Feeding assistance: Limited assistance Dressing Assistance: Maximum assistance     Functional Limitations Info  Sight, Hearing, Speech Sight Info: Adequate Hearing Info: Impaired Speech Info: Adequate    SPECIAL CARE FACTORS FREQUENCY  PT (By licensed PT), OT (By licensed OT)     PT Frequency: 5x per week OT Frequency: 5x per week            Contractures Contractures Info: Not present    Additional Factors Info  Code Status, Allergies, Isolation Precautions Code Status Info: DNR-Limited Allergies Info: NKA     Isolation Precautions Info: MRSA     Current Medications (09/23/2023):  This is the current hospital active medication list Current Facility-Administered Medications  Medication Dose Route Frequency Provider Last Rate Last Admin   acetaminophen (TYLENOL) tablet 650 mg  650 mg Oral Q6H PRN [name], MD   650 mg at 09/21/23 5621   acetaminophen (TYLENOL) tablet 650 mg  650 mg Oral Q4H PRN [name], MD       ascorbic acid (VITAMIN C) tablet 250 mg  250 mg Oral BID [name], MD   250 mg at 09/23/23 0856   aspirin EC tablet 81 mg  81 mg Oral  Daily [name], MD   81 mg at 09/23/23 0856   enoxaparin (LOVENOX) injection 40 mg  40 mg Subcutaneous Q24H [name], MD   40 mg at 09/22/23 1713   escitalopram (LEXAPRO) tablet 5 mg  5 mg Oral Daily Yong Channel C, NP   5 mg at 09/23/23 1225   feeding supplement (ENSURE ENLIVE / ENSURE PLUS) liquid 237 mL  237 mL Oral BID BM [name], MD   237 mL at 09/23/23 0857   hydrALAZINE (APRESOLINE) injection 5 mg  5 mg Intravenous Q20 Min PRN [name], MD        labetalol (NORMODYNE) injection 10 mg  10 mg Intravenous Q10 min PRN [name], MD       ondansetron [facility]) tablet 4 mg  4 mg Oral Q6H PRN [name], MD       Or   ondansetron [facility]) injection 4 mg  4 mg Intravenous Q6H PRN [name], MD       rosuvastatin (CRESTOR) tablet 20 mg  20 mg Oral Daily [name], MD   20 mg at 09/23/23 0856   sodium chloride flush (NS) 0.9 % injection 3 mL  3 mL Intravenous Q12H [name], MD   3 mL at 09/23/23 0857   sodium chloride flush (NS) 0.9 % injection 3 mL  3 mL Intravenous PRN [name], MD       zinc sulfate (50mg  elemental zinc) capsule 220 mg  220 mg Oral Daily [name], MD   220 mg at 09/23/23 1610     Discharge Medications: Please see discharge summary for a list of discharge medications.  Relevant Imaging Results:  Relevant Lab Results:   Additional Information SSN [id number]  Eduard Roux, LCSW

## 2023-09-23 NOTE — Progress Notes (Signed)
Palliative:  HPI:  78 y.o. male  with past medical history of hypertension, CAD s/p CABG, hyperlipidemia admitted as transfer from [facility] on 09/11/2023 with foot wounds and PAD. Vascular has noted that treatment would involve bilateral AKA.   I met today with Jack [name]. He tells me that he does not remember what he talked about with his family. I discuss with him that they had planned to discuss with him plans for possible amputation or not. He tells me again "I just want to get better." I asked him what he meant when he says better. He shakes his head and tells me he doesn't know. I reassured him that if he wants to have amputation to try and give him more time to live then that is okay too. He tearfully tells me that he does not want more time here. I clarified that the goal of amputation would be to try and prolong his life. He is clear that he does not want this. He seems overwhelmed with the ongoing conversation and just wants to move forward and get out of the hospital.   I called and discussed with son, Jack [name]. I discussed with Jack [name] my conversation with his father. We discussed the reality that Mr. [name] is likely not to do well with or without amputation. We discussed the potential physical pain and the psychological toll of amputation. We discussed that it is very reasonable to forego amputation and allow him to live out his life as happily as possible. We discussed antidepressant and I will start Lexapro. Hopefully this can help with his mood, depression, and tearful demeanor. Jack [name] agrees that he does not feel that his father really wants to have amputation. Jack [name] would like for his father to have rehab to try and optimize his abilities. Jack [name] would like to take his father home after rehab to fulfill his father's desire to be at home. Jack [name] would be open to help from hospice at home after rehab stay.   I called and discussed with son, Jack [name]. Jack [name] and I review my conversation and I shared with Jack [name]  what I shared with his brother. The brothers are not seeing eye to eye at times. After discussion Jack [name] agrees not to pursue surgery/amputation and to pursue rehab with palliative wound care. Jack [name] does not agree that his brother will be able to care for their father at home and feels he should proceed with placement after rehab. Jack [name] knows that he will be unable to care for his father's physical needs. We agree that we will pursue rehab and palliative wound care and family will need to have ongoing discussions during rehab to determine next step from there. Jack [name] agrees.   All questions/concerns addressed to the best of my ability. Emotional support provided. I updated Dr. Rito Ehrlich, Dr. Chestine Spore, and TOC.   Exam: Alert, oriented but forgetful. Easily overwhelmed and tearful. Breathing regular, unlabored. Abd soft. Moves all extremities. BLE wrapped.   Plan: - DNR/DNI - NO plans for surgical intervention - Pursue rehab placement (family disagree on plans for LTC placement vs home after rehab) - Outpatient palliative recommended to follow  55 min  Yong Channel, NP Palliative Medicine Team Pager [phone] (Please see [email or web address] for schedule) Team Phone [phone]

## 2023-09-23 NOTE — Progress Notes (Signed)
Patient is complaining about bilateral lower extremities severe pain which is not improving with Tylenol as needed.  Patient is alert oriented and hemodynamically stable.  Ordered morphine 1 mg every 3 hours as needed for moderate and severe pain.

## 2023-09-23 NOTE — Progress Notes (Addendum)
  Progress Note    09/23/2023 7:29 AM 5 Days Post-Op  Subjective:  says he feels ok. Denies any pain. Says he doesn't know what his family decided    Vitals:   09/22/23 2100 09/23/23 0503  BP: (!) 155/77 (!) 145/88  Pulse: 82 77  Resp: 18 16  Temp: 98.3 F (36.8 C) 98.7 F (37.1 C)  SpO2: 99% 98%    Physical Exam: General:  laying in bed, NAD Lungs:  nonlabored Extremities:  BLE wounds bandaged and dry  CBC    Component Value Date/Time   WBC 9.4 09/23/2023 0344   RBC 3.72 (L) 09/23/2023 0344   HGB 11.9 (L) 09/23/2023 0344   HCT 36.5 (L) 09/23/2023 0344   PLT 299 09/23/2023 0344   MCV 98.1 09/23/2023 0344   MCH 32.0 09/23/2023 0344   MCHC 32.6 09/23/2023 0344   RDW 15.2 09/23/2023 0344   LYMPHSABS 1.9 09/12/2023 0128   MONOABS 0.7 09/12/2023 0128   EOSABS 0.1 09/12/2023 0128   BASOSABS 0.1 09/12/2023 0128    BMET    Component Value Date/Time   NA 135 09/23/2023 0344   K 4.5 09/23/2023 0344   CL 98 09/23/2023 0344   CO2 26 09/23/2023 0344   GLUCOSE 104 (H) 09/23/2023 0344   BUN 15 09/23/2023 0344   CREATININE 0.98 09/23/2023 0344   CREATININE 1.04 08/07/2017 1012   CALCIUM 8.5 (L) 09/23/2023 0344   GFRNONAA >60 09/23/2023 0344   GFRAA >60 01/23/2018 0856    INR    Component Value Date/Time   INR 1.06 01/23/2018 0856     Intake/Output Summary (Last 24 hours) at 09/23/2023 0729 Last data filed at 09/23/2023 0100 Gross per 24 hour  Intake 963 ml  Output 850 ml  Net 113 ml      Assessment/Plan:  78 y.o. male with BLE wounds   -No acute events overnight. He denies any pain this morning -Continued tissue loss of BLE with likely osteo in the right heel -Patient's family met last night to discuss palliative wound care vs bilateral AKAs. Appreciate palliative care involvement   Loel Dubonnet, [place] [facility] [phone] 09/23/2023 7:29 AM  I have seen and evaluated the patient. I agree with the PA note as documented  above.  Called and had additional discussion with the son today.  I think the best way forward is likely palliative wound care given the patient really does not want bilateral above-knee amputations.  Discussed with bilateral above-knee amputations I think this would put him in a nursing facility for the remainder of his life.  Patient's priority is to get out of the hospital and go home.  He can get wound care at home for his heel eschars.  Very debilitated with limited to no mobility.  Family states they would need likely short-term placement in a facility before he can go home - discussed [place] [facility].  [name], MD [facility] [place] Office: [phone]

## 2023-09-23 NOTE — Progress Notes (Signed)
Dressing changed on his B/L lower extremities, PRN analgesics given for pain.

## 2023-09-24 DIAGNOSIS — L97519 Non-pressure chronic ulcer of other part of right foot with unspecified severity: Secondary | ICD-10-CM | POA: Diagnosis not present

## 2023-09-24 DIAGNOSIS — I70235 Atherosclerosis of native arteries of right leg with ulceration of other part of foot: Secondary | ICD-10-CM | POA: Diagnosis not present

## 2023-09-24 MED ORDER — ACETAMINOPHEN 325 MG PO TABS: 650.0000 mg | ORAL_TABLET | Freq: Four times a day (QID) | ORAL | Status: AC | PRN

## 2023-09-24 MED ORDER — ASCORBIC ACID 250 MG PO TABS: 250.0000 mg | ORAL_TABLET | Freq: Two times a day (BID) | ORAL | Status: AC

## 2023-09-24 MED ORDER — ESCITALOPRAM OXALATE 5 MG PO TABS: 5.0000 mg | ORAL_TABLET | Freq: Every day | ORAL | Status: AC

## 2023-09-24 MED ORDER — SENNOSIDES-DOCUSATE SODIUM 8.6-50 MG PO TABS: 2.0000 | ORAL_TABLET | Freq: Every day | ORAL | Status: AC

## 2023-09-24 MED ORDER — ROSUVASTATIN CALCIUM 20 MG PO TABS: 20.0000 mg | ORAL_TABLET | Freq: Every day | ORAL | Status: DC

## 2023-09-24 MED ORDER — MORPHINE SULFATE (PF) 2 MG/ML IV SOLN
1.0000 mg | INTRAVENOUS | Status: DC | PRN
  Administered 2023-10-01 – 2023-10-05 (×7): 1 mg via INTRAVENOUS
  Filled 2023-09-24 (×7): qty 1

## 2023-09-24 MED ORDER — ENSURE ENLIVE PO LIQD: 237.0000 mL | Freq: Two times a day (BID) | ORAL | Status: AC

## 2023-09-24 MED ORDER — ASPIRIN 81 MG PO TBEC: 81.0000 mg | DELAYED_RELEASE_TABLET | Freq: Every day | ORAL | Status: AC

## 2023-09-24 MED ORDER — OXYCODONE-ACETAMINOPHEN 5-325 MG PO TABS
1.0000 | ORAL_TABLET | ORAL | Status: DC | PRN
  Administered 2023-09-26 – 2023-10-06 (×13): 1 via ORAL
  Filled 2023-09-24 (×16): qty 1

## 2023-09-24 MED ORDER — OXYCODONE-ACETAMINOPHEN 5-325 MG PO TABS: 1.0000 | ORAL_TABLET | ORAL | 0 refills | Status: DC | PRN

## 2023-09-24 MED ORDER — ZINC SULFATE 220 (50 ZN) MG PO CAPS: 220.0000 mg | ORAL_CAPSULE | Freq: Every day | ORAL | Status: DC

## 2023-09-24 NOTE — Progress Notes (Signed)
Physical Therapy Treatment Patient Details Name: Jack [name] MRN: [id number] DOB: November 05, 1945 Today's Date: 09/24/2023   History of Present Illness 78 y.o. male presents to [facility] from [facility] 09/11/23 for vascular surgery consult/intervention for B LE. Pt admitted w/ deep B heel ulcers w/ eschar. LE angiogram scheduled for 1/24. Palliative following. PMHx: HTN, CA s/p CABG, HLD.    PT Comments  Pt continues to avoidant of mobility and fatigues quickly, needs max encouragement to participate with therapy. Pt needed min A to roll into lift pad and mod A +2 to sit up to EOB. Needs min A to maintain sitting EOB. Maximove used to move pt from bed to recliner. Pt would benefit from geomat for pressure relief in recliner next time.  Encouraged pt to remain in chair through lunchtime. LE there ex performed in sitting and pt also encouraged to continue with this while sitting up. Patient will benefit from continued inpatient follow up therapy, <3 hours/day. PT will continue to follow.    If plan is discharge home, recommend the following: A lot of help with bathing/dressing/bathroom;Assistance with cooking/housework;Assist for transportation;Help with stairs or ramp for entrance;Two people to help with walking and/or transfers;Supervision due to cognitive status;Direct supervision/assist for financial management;Direct supervision/assist for medications management   Can travel by private vehicle     No  Equipment Recommendations  None recommended by PT    Recommendations for Other Services       Precautions / Restrictions Precautions Precautions: Fall Precaution Comments: Contact precs; B LE wounds, eschar on B heels Required Braces or Orthoses: Other Brace Other Brace: prevalon boots; float heels Restrictions Weight Bearing Restrictions Per Provider Order: No Other Position/Activity Restrictions: still full WB allowed per Dr Alvino Chapel 1/23     Mobility  Bed Mobility Overal bed mobility: Needs  Assistance Bed Mobility: Rolling Rolling: Min assist, Used rails Sidelying to sit: +2 for physical assistance, Mod assist       General bed mobility comments: cues for pt to assist as without this he just lets himself be moved.    Transfers Overall transfer level: Needs assistance   Transfers: Bed to chair/wheelchair/BSC             General transfer comment: Pt requiring transfer with Maximove +2 for safety/physical assist this session due to fatigue Transfer via Lift Equipment: Maximove  Ambulation/Gait               General Gait Details: pt reports he has not been able to ambulate for months   Stairs             Wheelchair Mobility     Tilt Bed    Modified Rankin (Stroke Patients Only)       Balance Overall balance assessment: Needs assistance Sitting-balance support: Single extremity supported, Bilateral upper extremity supported, Feet supported Sitting balance-Leahy Scale: Fair Sitting balance - Comments: CGA to sit at EOB. Posterior pelvic tilt Postural control: Posterior lean     Standing balance comment: unable                            Cognition Arousal: Alert Behavior During Therapy: WFL for tasks assessed/performed Overall Cognitive Status: No family/caregiver present to determine baseline cognitive functioning Area of Impairment: Attention, Following commands, Safety/judgement, Awareness, Problem solving                   Current Attention Level: Selective   Following Commands: Follows one step  commands consistently, Follows one step commands with increased time, Follows multi-step commands inconsistently, Follows multi-step commands with increased time Safety/Judgement: Decreased awareness of safety, Decreased awareness of deficits Awareness: Emergent Problem Solving: Slow processing, Requires verbal cues General Comments: AAOx4 and pleasant throughout session. Requires increased time for processing and verbal  cues for sequencing and safety. Part of processing time seems to be [facility]        Exercises General Exercises - Lower Extremity Ankle Circles/Pumps: AROM, Both, 10 reps, Supine    General Comments General comments (skin integrity, edema, etc.): VSS. Pt encouraged to stay up in chair through lunch      Pertinent Vitals/Pain Pain Assessment Pain Assessment: Faces Faces Pain Scale: Hurts even more Pain Location: heels and bottom Pain Descriptors / Indicators: Grimacing, Guarding Pain Intervention(s): Limited activity within patient's tolerance, Monitored during session, Other (comment) (pillow placed in chair but pt would benefit from geomat)    Home Living                          Prior Function            PT Goals (current goals can now be found in the care plan section) Acute Rehab PT Goals Patient Stated Goal: go back home PT Goal Formulation: Patient unable to participate in goal setting Time For Goal Achievement: 09/27/23 Potential to Achieve Goals: Fair Progress towards PT goals: Not progressing toward goals - comment (fatigue)    Frequency    Min 1X/week      PT Plan      Co-evaluation              AM-PAC PT "6 Clicks" Mobility   Outcome Measure  Help needed turning from your back to your side while in a flat bed without using bedrails?: A Lot Help needed moving from lying on your back to sitting on the side of a flat bed without using bedrails?: A Lot Help needed moving to and from a bed to a chair (including a wheelchair)?: Total Help needed standing up from a chair using your arms (e.g., wheelchair or bedside chair)?: Total Help needed to walk in hospital room?: Total Help needed climbing 3-5 steps with a railing? : Total 6 Click Score: 8    End of Session   Activity Tolerance: Patient limited by fatigue Patient left: in bed;with call bell/phone within reach Nurse Communication: Mobility status;Need for lift equipment PT Visit  Diagnosis: Other abnormalities of gait and mobility (R26.89);Muscle weakness (generalized) (M62.81);Difficulty in walking, not elsewhere classified (R26.2);Adult, failure to thrive (R62.7)     Time: 1191-4782 PT Time Calculation (min) (ACUTE ONLY): 24 min  Charges:    $Therapeutic Activity: 23-37 mins PT General Charges $$ ACUTE PT VISIT: 1 Visit                     Lyanne Co, PT  Acute Rehab Services Secure chat preferred Office [phone]    Jack [name] 09/24/2023, 1:41 PM

## 2023-09-24 NOTE — Progress Notes (Signed)
Occupational Therapy Treatment Patient Details Name: Jack [name] MRN: [id number] DOB: 1946-06-18 Today's Date: 09/24/2023   History of present illness 78 y.o. male presents to [facility] from [facility] 09/11/23 for vascular surgery consult/intervention for B LE. Pt admitted w/ deep B heel ulcers w/ eschar. LE angiogram scheduled for 1/24. Palliative following. PMHx: HTN, CA s/p CABG, HLD.   OT comments  OT session focused on training in techniques for increased safety and independence with bed mobility during/in preparation for functional tasks and UB ADLs. Pt demonstrates ability to roll L/R in bed with Contact guard to Min assist with cues and complete UB ADLs in sitting with Set up to Min assist. Pt also transferred from recliner to bed with use of Maximove and assist of RN. Pt requiring cues for B hand/UE placement throughout transfers for increased safety. Pt participated well in session but limited by fatigue and pain. Pt will benefit from continued acute skilled OT services to address deficits outlined below and increase safety and independence with functional tasks. Post acute discharge, pt will benefit from intensive inpatient skilled rehab services < 3 hours per day to maximize rehab potential.       If plan is discharge home, recommend the following:  Two people to help with walking and/or transfers;Two people to help with bathing/dressing/bathroom;Assistance with cooking/housework;Assist for transportation;Help with stairs or ramp for entrance;Direct supervision/assist for financial management;Direct supervision/assist for medications management   Equipment Recommendations  Other (comment) (defer to next level of care)    Recommendations for Other Services      Precautions / Restrictions Precautions Precautions: Fall Precaution Comments: Contact precs; B LE wounds, eschar on B heels Required Braces or Orthoses: Other Brace Other Brace: prevalon boots; float heels Restrictions Weight  Bearing Restrictions Per Provider Order: No Other Position/Activity Restrictions: still full WB allowed per Dr Alvino Chapel 1/23       Mobility Bed Mobility Overal bed mobility: Needs Assistance Bed Mobility: Rolling Rolling: Contact guard assist, Min assist, Used rails         General bed mobility comments: cues for hand placement/technique    Transfers Overall transfer level: Needs assistance                 General transfer comment: Pt requiring transfer with Maximove +2 for safety/physical assist this session due to fatigue and pain in B LE with pt requiring frequent cues for hand/arm placement for safety during use of lift. Transfer via Lift Equipment: Maximove   Balance Overall balance assessment: Needs assistance Sitting-balance support: Single extremity supported, Bilateral upper extremity supported, Feet supported Sitting balance-Leahy Scale: Fair Sitting balance - Comments: sitting in recliner with back support and unsupported                                   ADL either performed or assessed with clinical judgement   ADL Overall ADL's : Needs assistance/impaired Eating/Feeding: Set up;Sitting               Upper Body Dressing : Minimal assistance;Sitting                     General ADL Comments: Pt declining further addressing ADLs with day due to fatigue after sitting up in recliner.    Extremity/Trunk Assessment Upper Extremity Assessment Upper Extremity Assessment: Generalized weakness   Lower Extremity Assessment Lower Extremity Assessment: Defer to PT evaluation  Vision       Perception     Praxis      Cognition Arousal: Alert Behavior During Therapy: WFL for tasks assessed/performed Overall Cognitive Status: Impaired/Different from baseline (No family/caregiver present to confirm baseline) Area of Impairment: Attention, Following commands, Safety/judgement, Awareness, Problem solving                    Current Attention Level: Selective   Following Commands: Follows one step commands consistently, Follows one step commands with increased time, Follows multi-step commands inconsistently, Follows multi-step commands with increased time Safety/Judgement: Decreased awareness of safety, Decreased awareness of deficits Awareness: Emergent Problem Solving: Slow processing, Requires verbal cues General Comments: AAOx4 and pleasant throughout session. Requires increased time for processing and verbal cues for sequencing and safety.        Exercises      Shoulder Instructions       General Comments VSS. Pt encouraged to stay up in chair through lunch    Pertinent Vitals/ Pain       Pain Assessment Pain Assessment: Faces Faces Pain Scale: Hurts even more Pain Location: heels and bottom Pain Descriptors / Indicators: Grimacing, Guarding Pain Intervention(s): Monitored during session, Repositioned, Limited activity within patient's tolerance  Home Living                                          Prior Functioning/Environment              Frequency  Min 1X/week        Progress Toward Goals  OT Goals(current goals can now be found in the care plan section)  Progress towards OT goals: Progressing toward goals     Plan      Co-evaluation                 AM-PAC OT "6 Clicks" Daily Activity     Outcome Measure   Help from another person eating meals?: A Little Help from another person taking care of personal grooming?: A Little Help from another person toileting, which includes using toliet, bedpan, or urinal?: Total Help from another person bathing (including washing, rinsing, drying)?: A Lot Help from another person to put on and taking off regular upper body clothing?: A Little Help from another person to put on and taking off regular lower body clothing?: Total 6 Click Score: 13    End of Session Equipment Utilized During Treatment:  Other (comment) (Maximove)  OT Visit Diagnosis: Muscle weakness (generalized) (M62.81);Other symptoms and signs involving cognitive function;Other (comment) (decreased activity tolerance)   Activity Tolerance Patient tolerated treatment well;Patient limited by fatigue;Patient limited by pain   Patient Left in bed;with call bell/phone within reach;with bed alarm set   Nurse Communication Mobility status;Other (comment) (Pain level)        Time: 0981-1914 OT Time Calculation (min): 24 min  Charges: OT General Charges $OT Visit: 1 Visit OT Treatments $Self Care/Home Management : 8-22 mins $Therapeutic Activity: 8-22 mins  Wheeler [employer]or[facility] "Orson Eva., OTR/L, MA Acute Rehab [phone]   Lendon Colonel 09/24/2023, 1:43 PM

## 2023-09-24 NOTE — Progress Notes (Signed)
Used lift to put Pt back in bed from recliner with help from OT [place].  Pt very appreciative.  All needs met and call bell in reach.  Will cont plan of care.

## 2023-09-24 NOTE — Discharge Summary (Signed)
[facility]  Physician Discharge Summary   Patient ID: Jack [name] MRN: [id number] DOB/AGE: 26-Jan-1946 78 y.o.  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)   Ulcers of both lower legs (HCC)   Hyperlipidemia LDL goal <70   Essential hypertension, benign   CKD stage 3a, GFR 45-59 ml/min (HCC)   Malnutrition of moderate degree   RECOMMENDATIONS FOR OUTPATIENT FOLLOW UP: CBC and basic metabolic panel in 1 week Daily wound care to wounds on both lower extremities   Home Health: SNF Equipment/Devices: None  CODE STATUS: DNR  DISCHARGE CONDITION: fair  Diet recommendation: Regular as tolerated  INITIAL HISTORY: 78 year old with a history of HTN, CAD status post CABG, and HLD who admits to not taking his usual medications or caring for himself appropriately since the death of his wife in 10-24-15.  He was admitted to [facility] 09/03/2023 for evaluation of what was felt to be bilateral lower extremity cellulitis with ulcerations.  He underwent I&D at that facility and was treated with empiric antibiotics.  Wound cultures grew MRSA sensitive to doxycycline.  ABIs of the right leg were noted to be significantly diminished and it was felt the patient would likely require vascular intervention for wound healing.  Attempts were made to transfer him starting 1/13 but due to limited bed availability he did not arrive at [facility] until late night 09/11/2023.  On his arrival at [facility] the patient has noted to have extensive wounds to both lower extremities but with no evidence of acute infection at this time.  Most prominent are deep bilateral heel ulcers with a thick overlying eschar.  Vascular surgery and palliative care was consulted.    HOSPITAL COURSE:   Bilateral lower extremity ulcerations and deep tissue injury to bilateral heels PAD -ABIs obtained at  [facility] 1/18 are consistent with severe right and left lower extremity arterial disease at 0.37 and 0.32 respectively -Status post angiogram 1/24.   -Patient has been unable to tolerate MRI of the right foot/heel -Current recommendations are either palliative wound care of bilateral lower extremities versus bilateral AKA.  Family has had multiple conversations with vascular surgery and palliative care. Plan is to continue with palliative wound care rather than amputation.  Plan is for skilled nursing facility for short-term rehab and then to go home with his family.   He has completed course of antibiotics. Pain medications have been prescribed as needed.    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 (Active)  09/11/23 2350  Location: Sacrum  Location Orientation: Anterior;Mid  Staging: Stage 1 -  Intact skin with non-blanchable redness of a localized area usually over a bony prominence.  Wound Description (  Comments):   Present on Admission: Yes  Dressing Type Foam - Lift dressing to assess site every shift 09/22/23 2000     Pressure Injury 09/18/23 Buttocks Left Stage 2 -  Partial thickness loss of dermis presenting as a shallow open injury with a red, pink wound bed without slough.  (Active)  09/18/23 2200  Location: Buttocks  Location Orientation: Left  Staging: Stage 2 -  Partial thickness loss of dermis presenting as a shallow open injury with a red, pink wound bed without slough.  Wound Description (Comments):   Present on Admission:   Dressing Type Foam - Lift dressing to assess site every shift 09/22/23 2000     Pressure Injury 09/19/23 Heel Right Unstageable - Full thickness tissue loss in which the base of the injury is covered by slough (yellow, tan, gray, green or brown) and/or eschar (tan, brown or black) in the wound bed. (Active)  09/19/23 1736  Location: Heel  Location Orientation: Right  Staging: Unstageable - Full thickness tissue loss in which the base of the injury is covered by slough (yellow, tan, gray, green or brown) and/or eschar (tan, brown or black) in the wound bed.  Wound Description (Comments):   Present on Admission: Yes  Dressing Type Foam - Lift dressing to assess site every shift 09/22/23 2000    Moderate protein calorie malnutrition Nutrition Problem: Moderate Malnutrition Etiology: chronic illness, social / environmental circumstances  Patient is stable.  Okay for discharge to SNF when bed is available.  PERTINENT LABS:  The results of significant diagnostics from this hospitalization (including imaging, microbiology, ancillary and laboratory) are listed below for reference.     Labs:   Basic Metabolic Panel: Recent Labs  Lab 09/23/23 0344  NA 135  K 4.5  CL 98  CO2 26  GLUCOSE 104*  BUN 15  CREATININE 0.98  CALCIUM 8.5*    CBC: Recent Labs  Lab 09/23/23 0344  WBC 9.4  HGB 11.9*  HCT 36.5*  MCV 98.1  PLT 299     IMAGING STUDIES VAS [place] LOWER EXTREMITY SAPHENOUS VEIN MAPPING Result Date: 09/21/2023 LOWER EXTREMITY VEIN MAPPING Patient Name:  Jack [name]  Date of Exam:   09/18/2023 Medical Rec #: [id number]      Accession #:    [id number] Date of Birth: 04/30/1946      Patient Gender: M Patient Age:   78  years Exam Location:  [facility] Procedure:      VAS [place] LOWER EXTREMITY SAPHENOUS VEIN MAPPING Referring Phys: Sherald Hess --------------------------------------------------------------------------------  Indications: Pre-op History:     History of PAD; patient is pre-operative for lower extremity bypass              graft.  Limitations: Banadges and wounds of bilateral calf & ankle. Comparison Study: No previous exams Performing Technologist: Jody Hill RVT, RDMS  Examination Guidelines: A complete evaluation includes B-mode imaging, spectral Doppler, color Doppler, and power Doppler as needed of all accessible portions of each vessel. Bilateral testing is considered an integral part of a complete examination. Limited examinations for reoccurring indications may be performed as noted. +------------+--------------+----------------------+------------+--------------+ RT Diameter  RT Findings           GSV          LT Diameter  LT Findings       (cm)                                            (  cm)                   +------------+--------------+----------------------+------------+--------------+     0.68      branching       Saphenofemoral        0.45                                                    Junction                                  +------------+--------------+----------------------+------------+--------------+     0.63                      Proximal thigh        0.47                   +------------+--------------+----------------------+------------+--------------+     0.53      branching         Mid thigh           0.33      branching    +------------+--------------+----------------------+------------+--------------+     0.58                       Distal thigh         0.14                   +------------+--------------+----------------------+------------+--------------+     0.51                           Knee                     not visualized  +------------+--------------+----------------------+------------+--------------+     0.48      branching         Prox calf           0.22                   +------------+--------------+----------------------+------------+--------------+     0.33      branching          Mid calf           0.40      branching    +------------+--------------+----------------------+------------+--------------+             not visualized     Distal calf                  not visualized +------------+--------------+----------------------+------------+--------------+             not visualized        Ankle                     not visualized +------------+--------------+----------------------+------------+--------------+ Diagnosing physician: Coral Else MD Electronically signed by Coral Else MD on 09/21/2023 at 5:42:42 PM.    Final    DG Foot Complete Right Result Date: 09/20/2023 CLINICAL DATA:  Osteomyelitis. EXAM: RIGHT FOOT COMPLETE - 3+ VIEW COMPARISON:  Radiograph 09/03/2023 FINDINGS: Hammertoe deformity of the toes. Bony under mineralization. Slightly decreased density involving the plantar calcaneus with likely overlying wound. No frank erosive changes. Midfoot osteoarthritis with dorsal spurring. No soft tissue gas or  radiopaque foreign body IMPRESSION: Slightly decreased density involving the plantar calcaneus suspicious for osteomyelitis. Electronically Signed   By: Narda Rutherford M.D.   On: 09/20/2023 15:33   PERIPHERAL VASCULAR CATHETERIZATION Result Date: 09/18/2023 Images from the original result were not included.   Patient name: Jack [name]           MRN: [id number]        DOB: 1946/03/28          Sex: male  09/18/2023 Pre-operative Diagnosis: Critical limb ischemia of bilateral lower extremities with bilateral heel wounds Post-operative diagnosis:  Same Surgeon:  [name], MD Procedure Performed: 1.  Ultrasound-guided access right common femoral artery 2.  Aortogram with  catheter selection of abdominal aorta 3.  Left lower extremity arteriogram with catheter selection of the left common femoral 4.  Right lower extremity arteriogram with catheter runoff from the right common femoral sheath  Indications: 78 year old male transferred from [facility] for evaluation of bilateral heel ulcers.  Previously discussed with family that I think he needs bilateral above-knee amputation versus palliative wound care.  He presents today for aortogram, lower extremity arteriogram after risk benefits discussed.  Findings:  Ultrasound-guided access right common femoral artery.  Diseased abdominal aorta with small aneurysm.   On the left he has a high-grade iliac stenosis at the junction of the common iliac and external iliac artery over 80%.  The left common femoral has a high-grade stenosis >80% and is calcified.  The profundus patent.  He has a flush SFA occlusion.  The SFA as well as the above and below-knee popliteal artery are all occluded.  He reconstitutes a tibial trifurcation.  AT is the dominant runoff into the foot and is widely patent.  The posterior tibial is also patent but has a high-grade stenosis in the distal calf.  On the right has significantly calcified iliac arteries.  He also has significant common femoral disease >60% with a patent profunda and flush SFA occlusion.  SFA is occluded with occluded above and below-knee popliteal artery.  AT reconstitutes and is the dominant runoff into the foot.             Procedure:  The patient was identified in the holding area and taken to room 8.  The patient was then placed supine on the table and prepped and draped in the usual sterile fashion.  A time out was called.  Ultrasound was used to evaluate the right common femoral artery.  It was patent .  A digital ultrasound image was acquired.  A micropuncture needle was used to access the right common femoral artery under ultrasound guidance.  An 018 wire was advanced without resistance  and a micropuncture sheath was placed.  The 018 wire was removed and a benson wire was placed.  The micropuncture sheath was exchanged for a 5 french sheath.  An omniflush catheter was advanced over the wire to the level of L-1.  An abdominal angiogram was obtained.  I then went across the aortic bifurcation with an Omni Flush catheter and the wire would not advance.  I changed to a glidewire and crossing catheter..  The catheter was placed into theleft common femoral artery and left runoff was obtained.  We then removed wires and catheters and got right leg runoff from the sheath in the right groin.  Pertinent findings are noted above.  He has no endovascular options.  Plan: Bilateral flush SFA occlusion with common femoral disease and no endovascular option.  I reviewed his case with several of my partners.  Ultimately I think he needs a left above-knee amputation as the left heel eschar is extensive and I do not think the foot is salvageable.  If there is a foot to save it would be the right foot and this would require common femoral endarterectomy with femoral to anterior tibial bypass.  Even this would be high risk for no wound healing long-term.  Have discussed getting foot MRI to ensure no osteomyelitis in the heel.   [name], MD [facility] Office: [phone]   CT HEAD WO CONTRAST ( ) Result Date: 09/12/2023 CLINICAL DATA:  Provided history: Delirium. EXAM: CT HEAD WITHOUT CONTRAST TECHNIQUE: Contiguous axial images were obtained from the base of the skull through the vertex without intravenous contrast. RADIATION DOSE REDUCTION: This exam was performed according to the departmental dose-optimization program which includes automated exposure control, adjustment of the mA and/or kV according to patient size and/or use of iterative reconstruction technique. COMPARISON:  Report from head CT 08/15/2015 (images unavailable). FINDINGS: Brain: Generalized  parenchymal atrophy. Prominence of the ventricles and sulci appears commensurate. Patchy and ill-defined hypoattenuation within the cerebral white matter, nonspecific but compatible with moderate chronic small vessel ischemic disease. There is no acute intracranial hemorrhage. No demarcated cortical infarct. No extra-axial fluid collection. No evidence of an intracranial mass. No midline shift. Vascular: No hyperdense vessel.  Atherosclerotic calcifications. Skull: No calvarial fracture or aggressive osseous lesion. Sinuses/Orbits: No mass or acute finding within the imaged orbits. Minimal mucosal thickening within the right maxillary sinus at the imaged levels. Minimal mucosal thickening within the bilateral sphenoid and ethmoid sinuses IMPRESSION: 1. No evidence of an acute intracranial abnormality. 2. Parenchymal atrophy and chronic small vessel ischemic disease. 3. Minor paranasal sinus mucosal thickening at the imaged levels. Electronically Signed   By: Jackey Loge D.O.   On: 09/12/2023 17:12   VAS [place] ABI WITH/WO TBI Result Date: 09/12/2023  LOWER EXTREMITY DOPPLER STUDY Patient Name:  Jack [name]  Date of Exam:   09/12/2023 Medical Rec #: [id number]      Accession #:    [id number] Date of Birth: [birth year]      Patient Gender: M Patient Age:   67 years Exam Location:  [facility] Procedure:      VAS [place] ABI WITH/WO TBI Referring Phys: Sherald Hess --------------------------------------------------------------------------------  Indications: Rest pain, and ulceration. High Risk Factors: Hypertension, hyperlipidemia, current smoker, coronary artery                    disease. Other Factors: Non compliance with medication and medical care since death of                his wife in 2017.  Limitations: Today's exam was limited due to Multiple lines in left upper              extremity, skin texture, involuntary movement, and arrythmia Comparison Study: Prior study done at [facility] 09/07/23  Performing Technologist: Sherren Kerns RVS  Examination Guidelines: A complete evaluation includes at minimum, Doppler waveform signals and systolic blood pressure reading at the level of bilateral brachial, anterior tibial, and posterior tibial arteries, when vessel segments are accessible. Bilateral testing is considered an integral part of a complete examination. Photoelectric Plethysmograph (PPG) waveforms and toe systolic pressure readings are included as required and additional duplex testing as needed. Limited examinations for reoccurring indications may be performed as noted.  ABI Findings: +---------+------------------+-----+-------------------+--------+ Right    Rt Pressure (mmHg)IndexWaveform           Comment  +---------+------------------+-----+-------------------+--------+ Brachial 147                    multiphasic                 +---------+------------------+-----+-------------------+--------+ PTA      51                0.35 dampened monophasic         +---------+------------------+-----+-------------------+--------+ DP       54                0.37 dampened monophasic         +---------+------------------+-----+-------------------+--------+ Great Toe0                 0.00 Absent                      +---------+------------------+-----+-------------------+--------+ +---------+------------------+-----+-------------------+---------------------+ Left     Lt Pressure (mmHg)IndexWaveform           Comment               +---------+------------------+-----+-------------------+---------------------+ Brachial                        multiphasic        multiple lines in arm +---------+------------------+-----+-------------------+---------------------+ PTA      47                0.32 dampened monophasic                      +---------+------------------+-----+-------------------+---------------------+ DP       33                0.22 dampened monophasic                       +---------+------------------+-----+-------------------+---------------------+ Great Toe0                 0.00 Absent                                   +---------+------------------+-----+-------------------+---------------------+ +-------+-----------+-----------+------------+------------+ ABI/TBIToday's ABIToday's TBIPrevious ABIPrevious TBI +-------+-----------+-----------+------------+------------+ Right  0.37       absent     0.32                     +-------+-----------+-----------+------------+------------+ Left   0.32       absent     0.29                     +-------+-----------+-----------+------------+------------+ Bilateral ABIs appear essentially unchanged compared to prior study on 09/07/2023.  Summary: Right: Resting right ankle-brachial index indicates severe right lower extremity arterial disease. Left: Resting left ankle-brachial index indicates severe left lower extremity arterial disease. *See table(s) above for measurements and observations.  Electronically signed by Sherald Hess MD on 09/12/2023 at 3:22:39 PM.    Final     DISCHARGE EXAMINATION: Vitals:   09/23/23 2012 09/24/23 0105 09/24/23 0506 09/24/23 0805  BP: 132/71 (!) 142/87 (!) 151/86 (!) 148/95  Pulse: 87 80 93   Resp: 18 16 17    Temp: 98.1 F (36.7 C) 97.9 F (36.6 C) 98 F (36.7 C) 97.9 F (36.6 C)  TempSrc: Oral Oral Oral Oral  SpO2:  96% 94% 92%   Weight:   110.7 kg   Height:       General appearance: Awake alert.  In no distress Resp: Clear to auscultation bilaterally.  Normal effort Cardio: S1-S2 is normal regular.  No S3-S4.  No rubs murmurs or bruit GI: Abdomen is soft.  Nontender nondistended.  Bowel sounds are present normal.  No masses organomegaly   DISPOSITION: SNF  Discharge Instructions     Call MD for:  difficulty breathing, headache or visual disturbances   Complete by: As directed    Call MD for:  extreme fatigue   Complete by: As directed     Call MD for:  persistant dizziness or light-headedness   Complete by: As directed    Call MD for:  persistant nausea and vomiting   Complete by: As directed    Call MD for:  redness, tenderness, or signs of infection (pain, swelling, redness, odor or green/yellow discharge around incision site)   Complete by: As directed    Call MD for:  severe uncontrolled pain   Complete by: As directed    Call MD for:  temperature >100.4   Complete by: As directed    Diet general   Complete by: As directed    Discharge instructions   Complete by: As directed    Please review instructions on the discharge summary.  You were cared for by a hospitalist during your hospital stay. If you have any questions about your discharge medications or the care you received while you were in the hospital after you are discharged, you can call the unit and asked to speak with the hospitalist on call if the hospitalist that took care of you is not available. Once you are discharged, your primary care physician will handle any further medical issues. Please note that NO REFILLS for any discharge medications will be authorized once you are discharged, as it is imperative that you return to your primary care physician (or establish a relationship with a primary care physician if you do not have one) for your aftercare needs so that they can reassess your need for medications and monitor your lab values. If you do not have a primary care physician, you can call [phone] for a physician referral.   Discharge wound care:   Complete by: As directed    Wound care  Daily      Comments: Paint LE wounds/heels with betadine, allow to air dry, top with foam Cover open wound right LE with single layer of xeroform, top with foam   Increase activity slowly   Complete by: As directed          Allergies as of 09/24/2023   No Known Allergies      Medication List     TAKE these medications    acetaminophen 325 MG  tablet Commonly known as: TYLENOL Take 2 tablets (650 mg total) by mouth every 6 (six) hours as needed for mild pain (pain score 1-3) (or Fever >/= 101).   ascorbic acid 250 MG tablet Commonly known as: VITAMIN C Take 1 tablet (250 mg total) by mouth 2 (two) times daily.   aspirin EC 81 MG tablet Take 1 tablet (81 mg total) by mouth daily. Swallow whole. Start taking on: September 25, 2023   escitalopram 5 MG tablet Commonly known as: LEXAPRO Take 1 tablet (5 mg total) by mouth daily. Start taking on: September 25, 2023   feeding supplement Liqd Take 237 mLs by mouth 2 (  two) times daily between meals.   oxyCODONE-acetaminophen 5-325 MG tablet Commonly known as: PERCOCET/ROXICET Take 1 tablet by mouth every 4 (four) hours as needed for moderate pain (pain score 4-6).   rosuvastatin 20 MG tablet Commonly known as: CRESTOR Take 1 tablet (20 mg total) by mouth daily.   senna-docusate 8.6-50 MG tablet Commonly known as: Senokot-S Take 2 tablets by mouth at bedtime.   zinc sulfate (50mg  elemental zinc) 220 (50 Zn) MG capsule Take 1 capsule (220 mg total) by mouth daily. Start taking on: September 25, 2023               Discharge Care Instructions  (From admission, onward)           Start     Ordered   09/24/23 0000  Discharge wound care:       Comments: Wound care  Daily      Comments: Paint LE wounds/heels with betadine, allow to air dry, top with foam Cover open wound right LE with single layer of xeroform, top with foam   09/24/23 1041              Follow-up Information     [name], MD Follow up.   Specialty: Vascular Surgery Why: If symptoms worsen Contact information: [address] [place] [address] [phone]                 TOTAL DISCHARGE TIME: 35 minutes  Jack [name]  [facility] Pager on [email or web address]  09/24/2023, 10:42 AM

## 2023-09-25 NOTE — Discharge Summary (Signed)
[facility]  Physician Discharge Summary   Patient ID: Jack [name] MRN: [id number] DOB/AGE: 1945/11/07 78 y.o.  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)   Ulcers of both lower legs (HCC)   Hyperlipidemia LDL goal <70   Essential hypertension, benign   CKD stage 3a, GFR 45-59 ml/min (HCC)   Malnutrition of moderate degree   RECOMMENDATIONS FOR OUTPATIENT FOLLOW UP: CBC and basic metabolic panel in 1 week Daily wound care to wounds on both lower extremities   Home Health: SNF Equipment/Devices: None  CODE STATUS: DNR  DISCHARGE CONDITION: fair  Diet recommendation: Regular as tolerated  INITIAL HISTORY: 78 year old with a history of HTN, CAD status post CABG, and HLD who admits to not taking his usual medications or caring for himself appropriately since the death of his wife in 03-Nov-2015.  He was admitted to [facility] 09/03/2023 for evaluation of what was felt to be bilateral lower extremity cellulitis with ulcerations.  He underwent I&D at that facility and was treated with empiric antibiotics.  Wound cultures grew MRSA sensitive to doxycycline.  ABIs of the right leg were noted to be significantly diminished and it was felt the patient would likely require vascular intervention for wound healing.  Attempts were made to transfer him starting 1/13 but due to limited bed availability he did not arrive at [facility] until late night 09/11/2023.  On his arrival at [facility] the patient has noted to have extensive wounds to both lower extremities but with no evidence of acute infection at this time.  Most prominent are deep bilateral heel ulcers with a thick overlying eschar.  Vascular surgery and palliative care was consulted.    HOSPITAL COURSE:   Bilateral lower extremity ulcerations and deep tissue injury to bilateral heels PAD -ABIs obtained at  [facility] 1/18 are consistent with severe right and left lower extremity arterial disease at 0.37 and 0.32 respectively -Status post angiogram 1/24.   -Patient has been unable to tolerate MRI of the right foot/heel -Current recommendations are either palliative wound care of bilateral lower extremities versus bilateral AKA.  Family has had multiple conversations with vascular surgery and palliative care. Plan is to continue with palliative wound care rather than amputation.  Plan is for skilled nursing facility for short-term rehab and then to go home with his family.   He has completed course of antibiotics. Pain medications have been prescribed as needed.    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 (Active)  09/11/23 2350  Location: Sacrum  Location Orientation: Anterior;Mid  Staging: Stage 1 -  Intact skin with non-blanchable redness of a localized area usually over a bony prominence.  Wound Description (  Comments):   Present on Admission: Yes  Dressing Type Foam - Lift dressing to assess site every shift 09/22/23 2000     Pressure Injury 09/18/23 Buttocks Left Stage 2 -  Partial thickness loss of dermis presenting as a shallow open injury with a red, pink wound bed without slough.  (Active)  09/18/23 2200  Location: Buttocks  Location Orientation: Left  Staging: Stage 2 -  Partial thickness loss of dermis presenting as a shallow open injury with a red, pink wound bed without slough.  Wound Description (Comments):   Present on Admission:   Dressing Type Foam - Lift dressing to assess site every shift 09/22/23 2000     Pressure Injury 09/19/23 Heel Right Unstageable - Full thickness tissue loss in which the base of the injury is covered by slough (yellow, tan, gray, green or brown) and/or eschar (tan, brown or black) in the wound bed. (Active)  09/19/23 1736  Location: Heel  Location Orientation: Right  Staging: Unstageable - Full thickness tissue loss in which the base of the injury is covered by slough (yellow, tan, gray, green or brown) and/or eschar (tan, brown or black) in the wound bed.  Wound Description (Comments):   Present on Admission: Yes  Dressing Type Foam - Lift dressing to assess site every shift 09/22/23 2000    Moderate protein calorie malnutrition Nutrition Problem: Moderate Malnutrition Etiology: chronic illness, social / environmental circumstances  Patient without any new complaints today.  Remains stable for discharge to SNF when bed is available.  PERTINENT LABS:  The results of significant diagnostics from this hospitalization (including imaging, microbiology, ancillary and laboratory) are listed below for reference.     Labs:   Basic Metabolic Panel: Recent Labs  Lab 09/23/23 0344  NA 135  K 4.5  CL 98  CO2 26  GLUCOSE 104*  BUN 15  CREATININE 0.98  CALCIUM 8.5*    CBC: Recent Labs  Lab 09/23/23 0344  WBC 9.4  HGB 11.9*  HCT 36.5*  MCV 98.1  PLT 299     IMAGING STUDIES VAS [place] LOWER EXTREMITY SAPHENOUS VEIN MAPPING Result Date: 09/21/2023 LOWER EXTREMITY VEIN MAPPING Patient Name:  JAQUARIUS SEDER  Date of Exam:   09/18/2023 Medical Rec #: [id number]      Accession #:    [id number] Date of Birth: 1946-06-21       Patient Gender: M Patient Age:   45 years Exam Location:  [facility] Procedure:      VAS [place] LOWER EXTREMITY SAPHENOUS VEIN MAPPING Referring Phys: Sherald Hess --------------------------------------------------------------------------------  Indications: Pre-op History:     History of PAD; patient is pre-operative for lower extremity bypass              graft.  Limitations: Banadges and wounds of bilateral calf & ankle. Comparison Study: No previous exams Performing Technologist: Jody Hill RVT, RDMS  Examination Guidelines: A complete evaluation includes B-mode imaging, spectral Doppler, color Doppler, and power Doppler as needed of all accessible portions of each vessel. Bilateral testing is considered an integral part of a complete examination. Limited examinations for reoccurring indications may be performed as noted. +------------+--------------+----------------------+------------+--------------+ RT Diameter  RT Findings           GSV          LT Diameter  LT Findings       (cm)                                            (  cm)                   +------------+--------------+----------------------+------------+--------------+     0.68      branching       Saphenofemoral        0.45                                                    Junction                                  +------------+--------------+----------------------+------------+--------------+     0.63                      Proximal thigh        0.47                   +------------+--------------+----------------------+------------+--------------+     0.53      branching         Mid thigh           0.33      branching    +------------+--------------+----------------------+------------+--------------+     0.58                       Distal thigh         0.14                   +------------+--------------+----------------------+------------+--------------+     0.51                           Knee                      not visualized +------------+--------------+----------------------+------------+--------------+     0.48      branching         Prox calf           0.22                   +------------+--------------+----------------------+------------+--------------+     0.33      branching          Mid calf           0.40      branching    +------------+--------------+----------------------+------------+--------------+             not visualized     Distal calf                  not visualized +------------+--------------+----------------------+------------+--------------+             not visualized        Ankle                     not visualized +------------+--------------+----------------------+------------+--------------+ Diagnosing physician: Coral Else MD Electronically signed by Coral Else MD on 09/21/2023 at 5:42:42 PM.    Final    DG Foot Complete Right Result Date: 09/20/2023 CLINICAL DATA:  Osteomyelitis. EXAM: RIGHT FOOT COMPLETE - 3+ VIEW COMPARISON:  Radiograph 09/03/2023 FINDINGS: Hammertoe deformity of the toes. Bony under mineralization. Slightly decreased density involving the plantar calcaneus with likely overlying wound. No frank erosive changes. Midfoot osteoarthritis with dorsal spurring. No soft tissue gas or  radiopaque foreign body IMPRESSION: Slightly decreased density involving the plantar calcaneus suspicious for osteomyelitis. Electronically Signed   By: Narda Rutherford M.D.   On: 09/20/2023 15:33   PERIPHERAL VASCULAR CATHETERIZATION Result Date: 09/18/2023 Images from the original result were not included.   Patient name: AVRIAN DELFAVERO           MRN: [id number]        DOB: Jun 16, 1946          Sex: male  09/18/2023 Pre-operative Diagnosis: Critical limb ischemia of bilateral lower extremities with bilateral heel wounds Post-operative diagnosis:  Same Surgeon:  [name], MD Procedure Performed: 1.  Ultrasound-guided access right common femoral  artery 2.  Aortogram with catheter selection of abdominal aorta 3.  Left lower extremity arteriogram with catheter selection of the left common femoral 4.  Right lower extremity arteriogram with catheter runoff from the right common femoral sheath  Indications: 78 year old male transferred from [facility] for evaluation of bilateral heel ulcers.  Previously discussed with family that I think he needs bilateral above-knee amputation versus palliative wound care.  He presents today for aortogram, lower extremity arteriogram after risk benefits discussed.  Findings:  Ultrasound-guided access right common femoral artery.  Diseased abdominal aorta with small aneurysm.   On the left he has a high-grade iliac stenosis at the junction of the common iliac and external iliac artery over 80%.  The left common femoral has a high-grade stenosis >80% and is calcified.  The profundus patent.  He has a flush SFA occlusion.  The SFA as well as the above and below-knee popliteal artery are all occluded.  He reconstitutes a tibial trifurcation.  AT is the dominant runoff into the foot and is widely patent.  The posterior tibial is also patent but has a high-grade stenosis in the distal calf.  On the right has significantly calcified iliac arteries.  He also has significant common femoral disease >60% with a patent profunda and flush SFA occlusion.  SFA is occluded with occluded above and below-knee popliteal artery.  AT reconstitutes and is the dominant runoff into the foot.             Procedure:  The patient was identified in the holding area and taken to room 8.  The patient was then placed supine on the table and prepped and draped in the usual sterile fashion.  A time out was called.  Ultrasound was used to evaluate the right common femoral artery.  It was patent .  A digital ultrasound image was acquired.  A micropuncture needle was used to access the right common femoral artery under ultrasound guidance.  An 018 wire was  advanced without resistance and a micropuncture sheath was placed.  The 018 wire was removed and a benson wire was placed.  The micropuncture sheath was exchanged for a 5 french sheath.  An omniflush catheter was advanced over the wire to the level of L-1.  An abdominal angiogram was obtained.  I then went across the aortic bifurcation with an Omni Flush catheter and the wire would not advance.  I changed to a glidewire and crossing catheter..  The catheter was placed into theleft common femoral artery and left runoff was obtained.  We then removed wires and catheters and got right leg runoff from the sheath in the right groin.  Pertinent findings are noted above.  He has no endovascular options.  Plan: Bilateral flush SFA occlusion with common femoral disease and no endovascular option.  I reviewed his case with several of my partners.  Ultimately I think he needs a left above-knee amputation as the left heel eschar is extensive and I do not think the foot is salvageable.  If there is a foot to save it would be the right foot and this would require common femoral endarterectomy with femoral to anterior tibial bypass.  Even this would be high risk for no wound healing long-term.  Have discussed getting foot MRI to ensure no osteomyelitis in the heel.   [name], MD [facility] Office: [phone]   CT HEAD WO CONTRAST ( ) Result Date: 09/12/2023 CLINICAL DATA:  Provided history: Delirium. EXAM: CT HEAD WITHOUT CONTRAST TECHNIQUE: Contiguous axial images were obtained from the base of the skull through the vertex without intravenous contrast. RADIATION DOSE REDUCTION: This exam was performed according to the departmental dose-optimization program which includes automated exposure control, adjustment of the mA and/or kV according to patient size and/or use of iterative reconstruction technique. COMPARISON:  Report from head CT 08/15/2015 (images unavailable). FINDINGS:  Brain: Generalized parenchymal atrophy. Prominence of the ventricles and sulci appears commensurate. Patchy and ill-defined hypoattenuation within the cerebral white matter, nonspecific but compatible with moderate chronic small vessel ischemic disease. There is no acute intracranial hemorrhage. No demarcated cortical infarct. No extra-axial fluid collection. No evidence of an intracranial mass. No midline shift. Vascular: No hyperdense vessel.  Atherosclerotic calcifications. Skull: No calvarial fracture or aggressive osseous lesion. Sinuses/Orbits: No mass or acute finding within the imaged orbits. Minimal mucosal thickening within the right maxillary sinus at the imaged levels. Minimal mucosal thickening within the bilateral sphenoid and ethmoid sinuses IMPRESSION: 1. No evidence of an acute intracranial abnormality. 2. Parenchymal atrophy and chronic small vessel ischemic disease. 3. Minor paranasal sinus mucosal thickening at the imaged levels. Electronically Signed   By: Jackey Loge D.O.   On: 09/12/2023 17:12   VAS [place] ABI WITH/WO TBI Result Date: 09/12/2023  LOWER EXTREMITY DOPPLER STUDY Patient Name:  RANVEER WAHLSTROM  Date of Exam:   09/12/2023 Medical Rec #: [id number]      Accession #:    [id number] Date of Birth: 12-10-1945      Patient Gender: M Patient Age:   74 years Exam Location:  [facility] Procedure:      VAS [place] ABI WITH/WO TBI Referring Phys: Sherald Hess --------------------------------------------------------------------------------  Indications: Rest pain, and ulceration. High Risk Factors: Hypertension, hyperlipidemia, current smoker, coronary artery                    disease. Other Factors: Non compliance with medication and medical care since death of                his wife in 2017.  Limitations: Today's exam was limited due to Multiple lines in left upper              extremity, skin texture, involuntary movement, and arrythmia Comparison Study: Prior study done at [facility] 09/07/23 Performing Technologist: Sherren Kerns RVS  Examination Guidelines: A complete evaluation includes at minimum, Doppler waveform signals and systolic blood pressure reading at the level of bilateral brachial, anterior tibial, and posterior tibial arteries, when vessel segments are accessible. Bilateral testing is considered an integral part of a complete examination. Photoelectric Plethysmograph (PPG) waveforms and toe systolic pressure readings are included as required and additional duplex testing as needed. Limited examinations for reoccurring indications may be performed as noted.  ABI Findings: +---------+------------------+-----+-------------------+--------+ Right    Rt Pressure (mmHg)IndexWaveform           Comment  +---------+------------------+-----+-------------------+--------+ Brachial 147                    multiphasic                 +---------+------------------+-----+-------------------+--------+ PTA      51                0.35 dampened monophasic         +---------+------------------+-----+-------------------+--------+ DP       54                0.37 dampened monophasic         +---------+------------------+-----+-------------------+--------+ Great Toe0                 0.00 Absent                      +---------+------------------+-----+-------------------+--------+ +---------+------------------+-----+-------------------+---------------------+ Left     Lt Pressure (mmHg)IndexWaveform           Comment               +---------+------------------+-----+-------------------+---------------------+ Brachial                        multiphasic        multiple lines in arm +---------+------------------+-----+-------------------+---------------------+ PTA      47                0.32 dampened monophasic                      +---------+------------------+-----+-------------------+---------------------+ DP       33                0.22  dampened monophasic                      +---------+------------------+-----+-------------------+---------------------+ Great Toe0                 0.00 Absent                                   +---------+------------------+-----+-------------------+---------------------+ +-------+-----------+-----------+------------+------------+ ABI/TBIToday's ABIToday's TBIPrevious ABIPrevious TBI +-------+-----------+-----------+------------+------------+ Right  0.37       absent     0.32                     +-------+-----------+-----------+------------+------------+ Left   0.32       absent     0.29                     +-------+-----------+-----------+------------+------------+ Bilateral ABIs appear essentially unchanged compared to prior study on 09/07/2023.  Summary: Right: Resting right ankle-brachial index indicates severe right lower extremity arterial disease. Left: Resting left ankle-brachial index indicates severe left lower extremity arterial disease. *See table(s) above for measurements and observations.  Electronically signed by Sherald Hess MD on 09/12/2023 at 3:22:39 PM.    Final     DISCHARGE EXAMINATION: Vitals:   09/24/23 0805 09/24/23 1649 09/25/23 0500 09/25/23 0926  BP: (!) 148/95 (!) 112/90  (!) 142/65  Pulse:    88  Resp:    18  Temp: 97.9 F (36.6 C) 98.3 F (36.8 C)  (!) 97.5 F (36.4 C)  TempSrc: Oral Oral  Oral  SpO2:  91%  Weight:   112.5 kg   Height:       Awake alert.  In no distress. S1-S2 is normal regular. Lungs are clear to auscultation bilaterally. Abdomen is soft.  Nontender nondistended.   DISPOSITION: SNF  Discharge Instructions     Call MD for:  difficulty breathing, headache or visual disturbances   Complete by: As directed    Call MD for:  extreme fatigue   Complete by: As directed    Call MD for:  persistant dizziness or light-headedness   Complete by: As directed    Call MD for:  persistant nausea and vomiting   Complete  by: As directed    Call MD for:  redness, tenderness, or signs of infection (pain, swelling, redness, odor or green/yellow discharge around incision site)   Complete by: As directed    Call MD for:  severe uncontrolled pain   Complete by: As directed    Call MD for:  temperature >100.4   Complete by: As directed    Diet general   Complete by: As directed    Discharge instructions   Complete by: As directed    Please review instructions on the discharge summary.  You were cared for by a hospitalist during your hospital stay. If you have any questions about your discharge medications or the care you received while you were in the hospital after you are discharged, you can call the unit and asked to speak with the hospitalist on call if the hospitalist that took care of you is not available. Once you are discharged, your primary care physician will handle any further medical issues. Please note that NO REFILLS for any discharge medications will be authorized once you are discharged, as it is imperative that you return to your primary care physician (or establish a relationship with a primary care physician if you do not have one) for your aftercare needs so that they can reassess your need for medications and monitor your lab values. If you do not have a primary care physician, you can call [phone] for a physician referral.   Discharge wound care:   Complete by: As directed    Wound care  Daily      Comments: Paint LE wounds/heels with betadine, allow to air dry, top with foam Cover open wound right LE with single layer of xeroform, top with foam   Increase activity slowly   Complete by: As directed          Allergies as of 09/25/2023   No Known Allergies      Medication List     TAKE these medications    acetaminophen 325 MG tablet Commonly known as: TYLENOL Take 2 tablets (650 mg total) by mouth every 6 (six) hours as needed for mild pain (pain score 1-3) (or Fever >/= 101).    ascorbic acid 250 MG tablet Commonly known as: VITAMIN C Take 1 tablet (250 mg total) by mouth 2 (two) times daily.   aspirin EC 81 MG tablet Take 1 tablet (81 mg total) by mouth daily. Swallow whole.   escitalopram 5 MG tablet Commonly known as: LEXAPRO Take 1 tablet (5 mg total) by mouth daily.   feeding supplement Liqd Take 237 mLs by mouth 2 (two) times daily between meals.   oxyCODONE-acetaminophen 5-325 MG tablet Commonly known as: PERCOCET/ROXICET Take 1 tablet by mouth every 4 (four) hours as needed for moderate pain (pain score 4-6).   rosuvastatin 20 MG tablet Commonly known  as: CRESTOR Take 1 tablet (20 mg total) by mouth daily.   senna-docusate 8.6-50 MG tablet Commonly known as: Senokot-S Take 2 tablets by mouth at bedtime.   zinc sulfate (50mg  elemental zinc) 220 (50 Zn) MG capsule Take 1 capsule (220 mg total) by mouth daily.               Discharge Care Instructions  (From admission, onward)           Start     Ordered   09/24/23 0000  Discharge wound care:       Comments: Wound care  Daily      Comments: Paint LE wounds/heels with betadine, allow to air dry, top with foam Cover open wound right LE with single layer of xeroform, top with foam   09/24/23 1041              Follow-up Information     [name], MD Follow up.   Specialty: Vascular Surgery Why: If symptoms worsen Contact information: [address] [place] [address] [phone]                 TOTAL DISCHARGE TIME: 35 minutes  Sadye Kiernan Rito Ehrlich  [facility] Pager on [email or web address]  09/25/2023, 11:01 AM

## 2023-09-25 NOTE — TOC Progression Note (Signed)
Transition of Care [facility]) - Progression Note    Patient Details  Name: TROYCE GIESKE MRN: [id number] Date of Birth: 1946/07/10  [facility]) CM/SW Contact  Eduard Roux, [place] Phone Number: 09/25/2023, 5:08 PM  Clinical Narrative:     Contacted patient's son, Caryn Bee, he confirmed [facility] is the preferred facility. [employer] confirmed availability CSW started [employer] - reference # [id number] - insurance pending   Antony Blackbird, MSW, LCSW Clinical Social Worker    Expected Discharge Plan: Skilled Nursing Facility Barriers to Discharge: Continued Medical Work up  Expected Discharge Plan and Services                                               Social Determinants of Health (SDOH) Interventions SDOH Screenings   Food Insecurity: No Food Insecurity (09/11/2023)  Housing: Low Risk  (09/11/2023)  Transportation Needs: No Transportation Needs (09/11/2023)  Utilities: Not At Risk (09/11/2023)  Alcohol Screen: Low Risk  (02/02/2018)  Depression (PHQ2-9): Low Risk  (04/10/2020)  Financial Resource Strain: Low Risk  (09/04/2023)   [facility]  Social Connections: Socially Isolated (09/11/2023)  Tobacco Use: High Risk (09/22/2023)    Readmission Risk Interventions     No data to display

## 2023-09-25 NOTE — Plan of Care (Signed)
  Problem: Elimination: Goal: Will not experience complications related to bowel motility Outcome: Progressing Goal: Will not experience complications related to urinary retention Outcome: Progressing   Problem: Skin Integrity: Goal: Risk for impaired skin integrity will decrease Outcome: Progressing   Problem: Activity: Goal: Ability to return to baseline activity level will improve Outcome: Progressing   Problem: Nutrition: Goal: Adequate nutrition will be maintained Outcome: Progressing

## 2023-09-25 NOTE — TOC Progression Note (Signed)
Transition of Care [facility]) - Progression Note    Patient Details  Name: Jack [name] MRN: [id number] Date of Birth: July 07, 1946  Transition of Care [facility]) CM/SW Contact  Eduard Roux, [place] Phone Number: 09/25/2023, 9:00 AM  Clinical Narrative:     Spoke with patient's son, Caryn Bee, provided verbally provided bed offers. Family will review and inform CSW of their choice.  Patient still will need insurance authorization before d/c to SNF.   Antony Blackbird, MSW, LCSW Clinical Social Worker    Expected Discharge Plan: Skilled Nursing Facility Barriers to Discharge: Continued Medical Work up  Expected Discharge Plan and Services                                               Social Determinants of Health (SDOH) Interventions SDOH Screenings   Food Insecurity: No Food Insecurity (09/11/2023)  Housing: Low Risk  (09/11/2023)  Transportation Needs: No Transportation Needs (09/11/2023)  Utilities: Not At Risk (09/11/2023)  Alcohol Screen: Low Risk  (02/02/2018)  Depression (PHQ2-9): Low Risk  (04/10/2020)  Financial Resource Strain: Low Risk  (09/04/2023)   Received from [facility]  Social Connections: Socially Isolated (09/11/2023)  Tobacco Use: High Risk (09/22/2023)    Readmission Risk Interventions     No data to display

## 2023-09-26 LAB — BASIC METABOLIC PANEL
Anion gap: 13 (ref 5–15)
BUN: 17 mg/dL (ref 8–23)
CO2: 24 mmol/L (ref 22–32)
Calcium: 8.9 mg/dL (ref 8.9–10.3)
Chloride: 99 mmol/L (ref 98–111)
Creatinine, Ser: 0.9 mg/dL (ref 0.61–1.24)
GFR, Estimated: >60 mL/min (ref >60)
Glucose, Bld: 101 mg/dL — ABNORMAL HIGH (ref 70–99)
Potassium: 4.2 mmol/L (ref 3.5–5.1)
Sodium: 136 mmol/L (ref 135–145)

## 2023-09-26 LAB — CBC
HCT: 38.2 % — ABNORMAL LOW (ref 39.0–52.0)
Hemoglobin: 12.6 g/dL — ABNORMAL LOW (ref 13.0–17.0)
MCH: 32 pg (ref 26.0–34.0)
MCHC: 33 g/dL (ref 30.0–36.0)
MCV: 97 fL (ref 80.0–100.0)
Platelets: 259 10*3/uL (ref 150–400)
RBC: 3.94 MIL/uL — ABNORMAL LOW (ref 4.22–5.81)
RDW: 15.1 % (ref 11.5–15.5)
WBC: 10.5 10*3/uL (ref 4.0–10.5)
nRBC: 0 % (ref 0.0–0.2)

## 2023-09-26 LAB — MAGNESIUM: Magnesium: 2 mg/dL (ref 1.7–2.4)

## 2023-09-26 NOTE — Discharge Summary (Signed)
[facility]  Physician Discharge Summary   Patient ID: Jack [name] MRN: [id number] DOB/AGE: 1946-03-25 78 y.o.  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)   Ulcers of both lower legs (HCC)   Hyperlipidemia LDL goal <70   Essential hypertension, benign   CKD stage 3a, GFR 45-59 ml/min (HCC)   Malnutrition of moderate degree   RECOMMENDATIONS FOR OUTPATIENT FOLLOW UP: CBC and basic metabolic panel in 1 week Daily wound care to wounds on both lower extremities   Home Health: SNF Equipment/Devices: None  CODE STATUS: DNR  DISCHARGE CONDITION: fair  Diet recommendation: Regular as tolerated  INITIAL HISTORY: 78 year old with a history of HTN, CAD status post CABG, and HLD who admits to not taking his usual medications or caring for himself appropriately since the death of his wife in 10-09-2015.  He was admitted to [facility] 09/03/2023 for evaluation of what was felt to be bilateral lower extremity cellulitis with ulcerations.  He underwent I&D at that facility and was treated with empiric antibiotics.  Wound cultures grew MRSA sensitive to doxycycline.  ABIs of the right leg were noted to be significantly diminished and it was felt the patient would likely require vascular intervention for wound healing.  Attempts were made to transfer him starting 1/13 but due to limited bed availability he did not arrive at [facility] until late night 09/11/2023.  On his arrival at [facility] the patient has noted to have extensive wounds to both lower extremities but with no evidence of acute infection at this time.  Most prominent are deep bilateral heel ulcers with a thick overlying eschar.  Vascular surgery and palliative care was consulted.    HOSPITAL COURSE:   Bilateral lower extremity ulcerations and deep tissue injury to bilateral heels PAD -ABIs obtained at  [facility] 1/18 are consistent with severe right and left lower extremity arterial disease at 0.37 and 0.32 respectively -Status post angiogram 1/24.   -Patient has been unable to tolerate MRI of the right foot/heel -Current recommendations are either palliative wound care of bilateral lower extremities versus bilateral AKA.  Family has had multiple conversations with vascular surgery and palliative care. Plan is to continue with palliative wound care rather than amputation.  Plan is for skilled nursing facility for short-term rehab and then to go home with his family.   He has completed course of antibiotics. Pain medications have been prescribed as needed.    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 (Active)  09/11/23 2350  Location: Sacrum  Location Orientation: Anterior;Mid  Staging: Stage 1 -  Intact skin with non-blanchable redness of a localized area usually over a bony prominence.  Wound Description (  Comments):   Present on Admission: Yes  Dressing Type Foam - Lift dressing to assess site every shift 09/22/23 2000     Pressure Injury 09/18/23 Buttocks Left Stage 2 -  Partial thickness loss of dermis presenting as a shallow open injury with a red, pink wound bed without slough.  (Active)  09/18/23 2200  Location: Buttocks  Location Orientation: Left  Staging: Stage 2 -  Partial thickness loss of dermis presenting as a shallow open injury with a red, pink wound bed without slough.  Wound Description (Comments):   Present on Admission:   Dressing Type Foam - Lift dressing to assess site every shift 09/22/23 2000     Pressure Injury 09/19/23 Heel Right Unstageable - Full thickness tissue loss in which the base of the injury is covered by slough (yellow, tan, gray, green or brown) and/or eschar (tan, brown or black) in the wound bed. (Active)  09/19/23 1736  Location: Heel  Location Orientation: Right  Staging: Unstageable - Full thickness tissue loss in which the base of the injury is covered by slough (yellow, tan, gray, green or brown) and/or eschar (tan, brown or black) in the wound bed.  Wound Description (Comments):   Present on Admission: Yes  Dressing Type Foam - Lift dressing to assess site every shift 09/22/23 2000    Moderate protein calorie malnutrition Nutrition Problem: Moderate Malnutrition Etiology: chronic illness, social / environmental circumstances  No complaints offered.  Waiting on rehab.  Stable for discharge to SNF when bed is available and when insurance authorization is available.  PERTINENT LABS:  The results of significant diagnostics from this hospitalization (including imaging, microbiology, ancillary and laboratory) are listed below for reference.     Labs:   Basic Metabolic Panel: Recent Labs  Lab 09/23/23 0344 09/26/23 0512  NA 135 136  K 4.5 4.2  CL 98 99  CO2 26 24  GLUCOSE 104* 101*  BUN 15 17  CREATININE 0.98 0.90  CALCIUM 8.5* 8.9  MG  --  2.0    CBC: Recent Labs  Lab 09/23/23 0344 09/26/23 0512  WBC 9.4 10.5  HGB 11.9* 12.6*  HCT 36.5* 38.2*  MCV 98.1 97.0  PLT 299 259     IMAGING STUDIES VAS [place] LOWER EXTREMITY SAPHENOUS VEIN MAPPING Result Date: 09/21/2023 LOWER EXTREMITY VEIN MAPPING Patient  Name:  BROLY HATFIELD  Date of Exam:   09/18/2023 Medical Rec #: [id number]      Accession #:    [id number] Date of Birth: Mar 28, 1946      Patient Gender: M Patient Age:   65 years Exam Location:  [facility] Procedure:      VAS [place] LOWER EXTREMITY SAPHENOUS VEIN MAPPING Referring Phys: Sherald Hess --------------------------------------------------------------------------------  Indications: Pre-op History:     History of PAD; patient is pre-operative for lower extremity bypass              graft.  Limitations: Banadges and wounds of bilateral calf & ankle. Comparison Study: No previous exams Performing Technologist: Jody Hill RVT, RDMS  Examination Guidelines: A complete evaluation includes B-mode imaging, spectral Doppler, color Doppler, and power Doppler as needed of all accessible portions of each vessel. Bilateral testing is considered an integral part of a complete examination. Limited examinations for reoccurring indications may be performed as noted. +------------+--------------+----------------------+------------+--------------+ RT Diameter  RT Findings           GSV          LT Diameter  LT Findings       (  cm)                                            (cm)                   +------------+--------------+----------------------+------------+--------------+     0.68      branching       Saphenofemoral        0.45                                                    Junction                                  +------------+--------------+----------------------+------------+--------------+     0.63                      Proximal thigh        0.47                   +------------+--------------+----------------------+------------+--------------+     0.53      branching         Mid thigh           0.33      branching    +------------+--------------+----------------------+------------+--------------+     0.58                       Distal thigh         0.14                    +------------+--------------+----------------------+------------+--------------+     0.51                           Knee                     not visualized +------------+--------------+----------------------+------------+--------------+     0.48      branching         Prox calf           0.22                   +------------+--------------+----------------------+------------+--------------+     0.33      branching          Mid calf           0.40      branching    +------------+--------------+----------------------+------------+--------------+             not visualized     Distal calf                  not visualized +------------+--------------+----------------------+------------+--------------+             not visualized        Ankle                     not visualized +------------+--------------+----------------------+------------+--------------+ Diagnosing physician: Coral Else MD Electronically signed by Coral Else MD on 09/21/2023 at 5:42:42 PM.    Final    DG Foot Complete Right Result Date: 09/20/2023 CLINICAL DATA:  Osteomyelitis. EXAM:  RIGHT FOOT COMPLETE - 3+ VIEW COMPARISON:  Radiograph 09/03/2023 FINDINGS: Hammertoe deformity of the toes. Bony under mineralization. Slightly decreased density involving the plantar calcaneus with likely overlying wound. No frank erosive changes. Midfoot osteoarthritis with dorsal spurring. No soft tissue gas or radiopaque foreign body IMPRESSION: Slightly decreased density involving the plantar calcaneus suspicious for osteomyelitis. Electronically Signed   By: Narda Rutherford M.D.   On: 09/20/2023 15:33   PERIPHERAL VASCULAR CATHETERIZATION Result Date: 09/18/2023 Images from the original result were not included.   Patient name: BRANDN MCGATH           MRN: [id number]        DOB: [birth year]          Sex: male  09/18/2023 Pre-operative Diagnosis: Critical limb ischemia of bilateral lower extremities with bilateral heel wounds  Post-operative diagnosis:  Same Surgeon:  [name], MD Procedure Performed: 1.  Ultrasound-guided access right common femoral artery 2.  Aortogram with catheter selection of abdominal aorta 3.  Left lower extremity arteriogram with catheter selection of the left common femoral 4.  Right lower extremity arteriogram with catheter runoff from the right common femoral sheath  Indications: 78 year old male transferred from [facility] for evaluation of bilateral heel ulcers.  Previously discussed with family that I think he needs bilateral above-knee amputation versus palliative wound care.  He presents today for aortogram, lower extremity arteriogram after risk benefits discussed.  Findings:  Ultrasound-guided access right common femoral artery.  Diseased abdominal aorta with small aneurysm.   On the left he has a high-grade iliac stenosis at the junction of the common iliac and external iliac artery over 80%.  The left common femoral has a high-grade stenosis >80% and is calcified.  The profundus patent.  He has a flush SFA occlusion.  The SFA as well as the above and below-knee popliteal artery are all occluded.  He reconstitutes a tibial trifurcation.  AT is the dominant runoff into the foot and is widely patent.  The posterior tibial is also patent but has a high-grade stenosis in the distal calf.  On the right has significantly calcified iliac arteries.  He also has significant common femoral disease >60% with a patent profunda and flush SFA occlusion.  SFA is occluded with occluded above and below-knee popliteal artery.  AT reconstitutes and is the dominant runoff into the foot.             Procedure:  The patient was identified in the holding area and taken to room 8.  The patient was then placed supine on the table and prepped and draped in the usual sterile fashion.  A time out was called.  Ultrasound was used to evaluate the right common femoral artery.  It was patent .  A digital ultrasound  image was acquired.  A micropuncture needle was used to access the right common femoral artery under ultrasound guidance.  An 018 wire was advanced without resistance and a micropuncture sheath was placed.  The 018 wire was removed and a benson wire was placed.  The micropuncture sheath was exchanged for a 5 french sheath.  An omniflush catheter was advanced over the wire to the level of L-1.  An abdominal angiogram was obtained.  I then went across the aortic bifurcation with an Omni Flush catheter and the wire would not advance.  I changed to a glidewire and crossing catheter..  The catheter was placed into theleft common femoral artery and left runoff was obtained.  We then removed wires and catheters and got right leg runoff from the sheath in the right groin.  Pertinent findings are noted above.  He has no endovascular options.  Plan: Bilateral flush SFA occlusion with common femoral disease and no endovascular option.  I reviewed his case with several of my partners.  Ultimately I think he needs a left above-knee amputation as the left heel eschar is extensive and I do not think the foot is salvageable.  If there is a foot to save it would be the right foot and this would require common femoral endarterectomy with femoral to anterior tibial bypass.  Even this would be high risk for no wound healing long-term.  Have discussed getting foot MRI to ensure no osteomyelitis in the heel.   [name], MD [facility] Office: [phone]   CT HEAD WO CONTRAST ( ) Result Date: 09/12/2023 CLINICAL DATA:  Provided history: Delirium. EXAM: CT HEAD WITHOUT CONTRAST TECHNIQUE: Contiguous axial images were obtained from the base of the skull through the vertex without intravenous contrast. RADIATION DOSE REDUCTION: This exam was performed according to the departmental dose-optimization program which includes automated exposure control, adjustment of the mA and/or kV according to  patient size and/or use of iterative reconstruction technique. COMPARISON:  Report from head CT 08/15/2015 (images unavailable). FINDINGS: Brain: Generalized parenchymal atrophy. Prominence of the ventricles and sulci appears commensurate. Patchy and ill-defined hypoattenuation within the cerebral white matter, nonspecific but compatible with moderate chronic small vessel ischemic disease. There is no acute intracranial hemorrhage. No demarcated cortical infarct. No extra-axial fluid collection. No evidence of an intracranial mass. No midline shift. Vascular: No hyperdense vessel.  Atherosclerotic calcifications. Skull: No calvarial fracture or aggressive osseous lesion. Sinuses/Orbits: No mass or acute finding within the imaged orbits. Minimal mucosal thickening within the right maxillary sinus at the imaged levels. Minimal mucosal thickening within the bilateral sphenoid and ethmoid sinuses IMPRESSION: 1. No evidence of an acute intracranial abnormality. 2. Parenchymal atrophy and chronic small vessel ischemic disease. 3. Minor paranasal sinus mucosal thickening at the imaged levels. Electronically Signed   By: Jackey Loge D.O.   On: 09/12/2023 17:12   VAS [place] ABI WITH/WO TBI Result Date: 09/12/2023  LOWER EXTREMITY DOPPLER STUDY Patient Name:  WILHO SHARPLEY  Date of Exam:   09/12/2023 Medical Rec #: [id number]      Accession #:    [id number] Date of Birth: [birth year]      Patient Gender: M Patient Age:   58 years Exam Location:  [facility] Procedure:      VAS [place] ABI WITH/WO TBI Referring Phys: Sherald Hess --------------------------------------------------------------------------------  Indications: Rest pain, and ulceration. High Risk Factors: Hypertension, hyperlipidemia, current smoker, coronary artery                    disease. Other Factors: Non compliance with medication and medical care since death of                his wife in 2017.  Limitations: Today's exam was limited due to Multiple  lines in left upper              extremity, skin texture, involuntary movement, and arrythmia Comparison Study: Prior study done at [facility] 09/07/23 Performing Technologist: Sherren Kerns RVS  Examination Guidelines: A complete evaluation includes at minimum, Doppler waveform signals and systolic blood pressure reading at the level of bilateral brachial, anterior tibial, and posterior tibial arteries, when  vessel segments are accessible. Bilateral testing is considered an integral part of a complete examination. Photoelectric Plethysmograph (PPG) waveforms and toe systolic pressure readings are included as required and additional duplex testing as needed. Limited examinations for reoccurring indications may be performed as noted.  ABI Findings: +---------+------------------+-----+-------------------+--------+ Right    Rt Pressure (mmHg)IndexWaveform           Comment  +---------+------------------+-----+-------------------+--------+ Brachial 147                    multiphasic                 +---------+------------------+-----+-------------------+--------+ PTA      51                0.35 dampened monophasic         +---------+------------------+-----+-------------------+--------+ DP       54                0.37 dampened monophasic         +---------+------------------+-----+-------------------+--------+ Great Toe0                 0.00 Absent                      +---------+------------------+-----+-------------------+--------+ +---------+------------------+-----+-------------------+---------------------+ Left     Lt Pressure (mmHg)IndexWaveform           Comment               +---------+------------------+-----+-------------------+---------------------+ Brachial                        multiphasic        multiple lines in arm +---------+------------------+-----+-------------------+---------------------+ PTA      47                0.32 dampened monophasic                       +---------+------------------+-----+-------------------+---------------------+ DP       33                0.22 dampened monophasic                      +---------+------------------+-----+-------------------+---------------------+ Great Toe0                 0.00 Absent                                   +---------+------------------+-----+-------------------+---------------------+ +-------+-----------+-----------+------------+------------+ ABI/TBIToday's ABIToday's TBIPrevious ABIPrevious TBI +-------+-----------+-----------+------------+------------+ Right  0.37       absent     0.32                     +-------+-----------+-----------+------------+------------+ Left   0.32       absent     0.29                     +-------+-----------+-----------+------------+------------+ Bilateral ABIs appear essentially unchanged compared to prior study on 09/07/2023.  Summary: Right: Resting right ankle-brachial index indicates severe right lower extremity arterial disease. Left: Resting left ankle-brachial index indicates severe left lower extremity arterial disease. *See table(s) above for measurements and observations.  Electronically signed by Sherald Hess MD on 09/12/2023 at 3:22:39 PM.    Final     DISCHARGE EXAMINATION: Vitals:   09/25/23 1555 09/25/23 1924 09/25/23 2100 09/26/23 0912  BP: Marland Kitchen)  155/87 (!) 155/73 (!) 138/90 (!) 146/86  Pulse: 78 63 85   Resp: 17 20 18 18   Temp: 98.6 F (37 C) 98.4 F (36.9 C) 98.2 F (36.8 C) 97.7 F (36.5 C)  TempSrc: Oral Oral Oral Oral  SpO2: 92% 93% 95% 94%  Weight:      Height:       General appearance: Awake alert.  In no distress Resp: Clear to auscultation bilaterally.  Normal effort Cardio: S1-S2 is normal regular.  No S3-S4.  No rubs murmurs or bruit GI: Abdomen is soft.  Nontender nondistended.  Bowel sounds are present normal.  No masses organomegaly   DISPOSITION: SNF  Discharge Instructions     Call MD  for:  difficulty breathing, headache or visual disturbances   Complete by: As directed    Call MD for:  extreme fatigue   Complete by: As directed    Call MD for:  persistant dizziness or light-headedness   Complete by: As directed    Call MD for:  persistant nausea and vomiting   Complete by: As directed    Call MD for:  redness, tenderness, or signs of infection (pain, swelling, redness, odor or green/yellow discharge around incision site)   Complete by: As directed    Call MD for:  severe uncontrolled pain   Complete by: As directed    Call MD for:  temperature >100.4   Complete by: As directed    Diet general   Complete by: As directed    Discharge instructions   Complete by: As directed    Please review instructions on the discharge summary.  You were cared for by a hospitalist during your hospital stay. If you have any questions about your discharge medications or the care you received while you were in the hospital after you are discharged, you can call the unit and asked to speak with the hospitalist on call if the hospitalist that took care of you is not available. Once you are discharged, your primary care physician will handle any further medical issues. Please note that NO REFILLS for any discharge medications will be authorized once you are discharged, as it is imperative that you return to your primary care physician (or establish a relationship with a primary care physician if you do not have one) for your aftercare needs so that they can reassess your need for medications and monitor your lab values. If you do not have a primary care physician, you can call [phone] for a physician referral.   Discharge wound care:   Complete by: As directed    Wound care  Daily      Comments: Paint LE wounds/heels with betadine, allow to air dry, top with foam Cover open wound right LE with single layer of xeroform, top with foam   Increase activity slowly   Complete by: As directed           Allergies as of 09/26/2023   No Known Allergies      Medication List     TAKE these medications    acetaminophen 325 MG tablet Commonly known as: TYLENOL Take 2 tablets (650 mg total) by mouth every 6 (six) hours as needed for mild pain (pain score 1-3) (or Fever >/= 101).   ascorbic acid 250 MG tablet Commonly known as: VITAMIN C Take 1 tablet (250 mg total) by mouth 2 (two) times daily.   aspirin EC 81 MG tablet Take 1 tablet (81 mg total) by mouth daily.  Swallow whole.   escitalopram 5 MG tablet Commonly known as: LEXAPRO Take 1 tablet (5 mg total) by mouth daily.   feeding supplement Liqd Take 237 mLs by mouth 2 (two) times daily between meals.   oxyCODONE-acetaminophen 5-325 MG tablet Commonly known as: PERCOCET/ROXICET Take 1 tablet by mouth every 4 (four) hours as needed for moderate pain (pain score 4-6).   rosuvastatin 20 MG tablet Commonly known as: CRESTOR Take 1 tablet (20 mg total) by mouth daily.   senna-docusate 8.6-50 MG tablet Commonly known as: Senokot-S Take 2 tablets by mouth at bedtime.   zinc sulfate (50mg  elemental zinc) 220 (50 Zn) MG capsule Take 1 capsule (220 mg total) by mouth daily.               Discharge Care Instructions  (From admission, onward)           Start     Ordered   09/24/23 0000  Discharge wound care:       Comments: Wound care  Daily      Comments: Paint LE wounds/heels with betadine, allow to air dry, top with foam Cover open wound right LE with single layer of xeroform, top with foam   09/24/23 1041              Follow-up Information     [name], MD Follow up.   Specialty: Vascular Surgery Why: If symptoms worsen Contact information: [address] [place] [address] [phone]                 TOTAL DISCHARGE TIME: 35 minutes  Burgandy Hackworth Rito Ehrlich  [facility] Pager on [email or web address]  09/26/2023, 9:43 AM

## 2023-09-27 NOTE — TOC Progression Note (Signed)
Transition of Care [facility]) - Progression Note    Patient Details  Name: Jack [name] MRN: [id number] Date of Birth: 1945/09/13  Transition of Care [facility] CM/SW Contact  Jimmy Picket, [place] Phone Number: 09/27/2023, 10:32 AM  Clinical Narrative:     [name] is pending. CSW uploaded updated MD and wound care notes in portal. CSW to continue to follow.  Expected Discharge Plan: Skilled Nursing Facility Barriers to Discharge: Continued Medical Work up  Expected Discharge Plan and Services                                               Social Determinants of Health (SDOH) Interventions SDOH Screenings   Food Insecurity: No Food Insecurity (09/11/2023)  Housing: Low Risk  (09/11/2023)  Transportation Needs: No Transportation Needs (09/11/2023)  Utilities: Not At Risk (09/11/2023)  Alcohol Screen: Low Risk  (02/02/2018)  Depression (PHQ2-9): Low Risk  (04/10/2020)  Financial Resource Strain: Low Risk  (09/04/2023)   Received from [facility]  Social Connections: Socially Isolated (09/11/2023)  Tobacco Use: High Risk (09/22/2023)    Readmission Risk Interventions     No data to display

## 2023-09-27 NOTE — Discharge Summary (Signed)
[facility]  Physician Discharge Summary   Patient ID: Jack [name] MRN: [id number] DOB/AGE: August 28, 1945 78 y.o.  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)   Ulcers of both lower legs (HCC)   Hyperlipidemia LDL goal <70   Essential hypertension, benign   CKD stage 3a, GFR 45-59 ml/min (HCC)   Malnutrition of moderate degree   RECOMMENDATIONS FOR OUTPATIENT FOLLOW UP: CBC and basic metabolic panel in 1 week Daily wound care to wounds on both lower extremities   Home Health: SNF Equipment/Devices: None  CODE STATUS: DNR  DISCHARGE CONDITION: fair  Diet recommendation: Regular as tolerated  INITIAL HISTORY: 78 year old with a history of HTN, CAD status post CABG, and HLD who admits to not taking his usual medications or caring for himself appropriately since the death of his wife in 10-26-2015.  He was admitted to [facility] 09/03/2023 for evaluation of what was felt to be bilateral lower extremity cellulitis with ulcerations.  He underwent I&D at that facility and was treated with empiric antibiotics.  Wound cultures grew MRSA sensitive to doxycycline.  ABIs of the right leg were noted to be significantly diminished and it was felt the patient would likely require vascular intervention for wound healing.  Attempts were made to transfer him starting 1/13 but due to limited bed availability he did not arrive at [facility] until late night 09/11/2023.  On his arrival at [facility] the patient has noted to have extensive wounds to both lower extremities but with no evidence of acute infection at this time.  Most prominent are deep bilateral heel ulcers with a thick overlying eschar.  Vascular surgery and palliative care was consulted.    HOSPITAL COURSE:   Bilateral lower extremity ulcerations and deep tissue injury to bilateral heels PAD -ABIs obtained at  [facility] 1/18 are consistent with severe right and left lower extremity arterial disease at 0.37 and 0.32 respectively -Status post angiogram 1/24.   -Patient has been unable to tolerate MRI of the right foot/heel -Current recommendations are either palliative wound care of bilateral lower extremities versus bilateral AKA.  Family has had multiple conversations with vascular surgery and palliative care. Plan is to continue with palliative wound care rather than amputation.  Plan is for skilled nursing facility for short-term rehab and then to go home with his family.   He has completed course of antibiotics. Pain medications have been prescribed as needed.    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 (Active)  09/11/23 2350  Location: Sacrum  Location Orientation: Anterior;Mid  Staging: Stage 1 -  Intact skin with non-blanchable redness of a localized area usually over a bony prominence.  Wound Description (  Comments):   Present on Admission: Yes  Dressing Type Foam - Lift dressing to assess site every shift 09/22/23 2000     Pressure Injury 09/18/23 Buttocks Left Stage 2 -  Partial thickness loss of dermis presenting as a shallow open injury with a red, pink wound bed without slough.  (Active)  09/18/23 2200  Location: Buttocks  Location Orientation: Left  Staging: Stage 2 -  Partial thickness loss of dermis presenting as a shallow open injury with a red, pink wound bed without slough.  Wound Description (Comments):   Present on Admission:   Dressing Type Foam - Lift dressing to assess site every shift 09/22/23 2000     Pressure Injury 09/19/23 Heel Right Unstageable - Full thickness tissue loss in which the base of the injury is covered by slough (yellow, tan, gray, green or brown) and/or eschar (tan, brown or black) in the wound bed. (Active)  09/19/23 1736  Location: Heel  Location Orientation: Right  Staging: Unstageable - Full thickness tissue loss in which the base of the injury is covered by slough (yellow, tan, gray, green or brown) and/or eschar (tan, brown or black) in the wound bed.  Wound Description (Comments):   Present on Admission: Yes  Dressing Type Foam - Lift dressing to assess site every shift 09/22/23 2000    Moderate protein calorie malnutrition Nutrition Problem: Moderate Malnutrition Etiology: chronic illness, social / environmental circumstances  Patient remains stable.  Denies any complaints.  Reports good appetite.  Remains stable for discharge to SNF.  PERTINENT LABS:  The results of significant diagnostics from this hospitalization (including imaging, microbiology, ancillary and laboratory) are listed below for reference.     Labs:   Basic Metabolic Panel: Recent Labs  Lab 09/23/23 0344 09/26/23 0512  NA 135 136  K 4.5 4.2  CL 98 99  CO2 26 24  GLUCOSE 104* 101*  BUN 15 17  CREATININE 0.98 0.90  CALCIUM 8.5* 8.9  MG  --  2.0    CBC: Recent Labs  Lab 09/23/23 0344 09/26/23 0512  WBC 9.4 10.5  HGB 11.9* 12.6*  HCT 36.5* 38.2*  MCV 98.1 97.0  PLT 299 259     IMAGING STUDIES VAS [place] LOWER EXTREMITY SAPHENOUS VEIN MAPPING Result Date: 09/21/2023 LOWER EXTREMITY VEIN MAPPING Patient Name:  Jack [name]  Date  of Exam:   09/18/2023 Medical Rec #: [id number]      Accession #:    [id number] Date of Birth: 08-06-1946      Patient Gender: M Patient Age:   71 years Exam Location:  [facility] Procedure:      VAS [place] LOWER EXTREMITY SAPHENOUS VEIN MAPPING Referring Phys: Sherald Hess --------------------------------------------------------------------------------  Indications: Pre-op History:     History of PAD; patient is pre-operative for lower extremity bypass              graft.  Limitations: Banadges and wounds of bilateral calf & ankle. Comparison Study: No previous exams Performing Technologist: Jody Hill RVT, RDMS  Examination Guidelines: A complete evaluation includes B-mode imaging, spectral Doppler, color Doppler, and power Doppler as needed of all accessible portions of each vessel. Bilateral testing is considered an integral part of a complete examination. Limited examinations for reoccurring indications may be performed as noted. +------------+--------------+----------------------+------------+--------------+ RT Diameter  RT Findings           GSV          LT Diameter  LT Findings       (  cm)                                            (cm)                   +------------+--------------+----------------------+------------+--------------+     0.68      branching       Saphenofemoral        0.45                                                    Junction                                  +------------+--------------+----------------------+------------+--------------+     0.63                      Proximal thigh        0.47                   +------------+--------------+----------------------+------------+--------------+     0.53      branching         Mid thigh           0.33      branching    +------------+--------------+----------------------+------------+--------------+     0.58                       Distal thigh         0.14                    +------------+--------------+----------------------+------------+--------------+     0.51                           Knee                     not visualized +------------+--------------+----------------------+------------+--------------+     0.48      branching         Prox calf           0.22                   +------------+--------------+----------------------+------------+--------------+     0.33      branching          Mid calf           0.40      branching    +------------+--------------+----------------------+------------+--------------+             not visualized     Distal calf                  not visualized +------------+--------------+----------------------+------------+--------------+             not visualized        Ankle                     not visualized +------------+--------------+----------------------+------------+--------------+ Diagnosing physician: Coral Else MD Electronically signed by Coral Else MD on 09/21/2023 at 5:42:42 PM.    Final    DG Foot Complete Right Result Date: 09/20/2023 CLINICAL DATA:  Osteomyelitis. EXAM:  RIGHT FOOT COMPLETE - 3+ VIEW COMPARISON:  Radiograph 09/03/2023 FINDINGS: Hammertoe deformity of the toes. Bony under mineralization. Slightly decreased density involving the plantar calcaneus with likely overlying wound. No frank erosive changes. Midfoot osteoarthritis with dorsal spurring. No soft tissue gas or radiopaque foreign body IMPRESSION: Slightly decreased density involving the plantar calcaneus suspicious for osteomyelitis. Electronically Signed   By: Narda Rutherford M.D.   On: 09/20/2023 15:33   PERIPHERAL VASCULAR CATHETERIZATION Result Date: 09/18/2023 Images from the original result were not included.   Patient name: JOSEAN LYCAN           MRN: [id number]        DOB: 1945-12-03          Sex: male  09/18/2023 Pre-operative Diagnosis: Critical limb ischemia of bilateral lower extremities with bilateral heel wounds  Post-operative diagnosis:  Same Surgeon:  [name], MD Procedure Performed: 1.  Ultrasound-guided access right common femoral artery 2.  Aortogram with catheter selection of abdominal aorta 3.  Left lower extremity arteriogram with catheter selection of the left common femoral 4.  Right lower extremity arteriogram with catheter runoff from the right common femoral sheath  Indications: 78 year old male transferred from [facility] for evaluation of bilateral heel ulcers.  Previously discussed with family that I think he needs bilateral above-knee amputation versus palliative wound care.  He presents today for aortogram, lower extremity arteriogram after risk benefits discussed.  Findings:  Ultrasound-guided access right common femoral artery.  Diseased abdominal aorta with small aneurysm.   On the left he has a high-grade iliac stenosis at the junction of the common iliac and external iliac artery over 80%.  The left common femoral has a high-grade stenosis >80% and is calcified.  The profundus patent.  He has a flush SFA occlusion.  The SFA as well as the above and below-knee popliteal artery are all occluded.  He reconstitutes a tibial trifurcation.  AT is the dominant runoff into the foot and is widely patent.  The posterior tibial is also patent but has a high-grade stenosis in the distal calf.  On the right has significantly calcified iliac arteries.  He also has significant common femoral disease >60% with a patent profunda and flush SFA occlusion.  SFA is occluded with occluded above and below-knee popliteal artery.  AT reconstitutes and is the dominant runoff into the foot.             Procedure:  The patient was identified in the holding area and taken to room 8.  The patient was then placed supine on the table and prepped and draped in the usual sterile fashion.  A time out was called.  Ultrasound was used to evaluate the right common femoral artery.  It was patent .  A digital ultrasound  image was acquired.  A micropuncture needle was used to access the right common femoral artery under ultrasound guidance.  An 018 wire was advanced without resistance and a micropuncture sheath was placed.  The 018 wire was removed and a benson wire was placed.  The micropuncture sheath was exchanged for a 5 french sheath.  An omniflush catheter was advanced over the wire to the level of L-1.  An abdominal angiogram was obtained.  I then went across the aortic bifurcation with an Omni Flush catheter and the wire would not advance.  I changed to a glidewire and crossing catheter..  The catheter was placed into theleft common femoral artery and left runoff was obtained.  We then removed wires and catheters and got right leg runoff from the sheath in the right groin.  Pertinent findings are noted above.  He has no endovascular options.  Plan: Bilateral flush SFA occlusion with common femoral disease and no endovascular option.  I reviewed his case with several of my partners.  Ultimately I think he needs a left above-knee amputation as the left heel eschar is extensive and I do not think the foot is salvageable.  If there is a foot to save it would be the right foot and this would require common femoral endarterectomy with femoral to anterior tibial bypass.  Even this would be high risk for no wound healing long-term.  Have discussed getting foot MRI to ensure no osteomyelitis in the heel.   [name], MD [facility] Office: [phone]   CT HEAD WO CONTRAST ( ) Result Date: 09/12/2023 CLINICAL DATA:  Provided history: Delirium. EXAM: CT HEAD WITHOUT CONTRAST TECHNIQUE: Contiguous axial images were obtained from the base of the skull through the vertex without intravenous contrast. RADIATION DOSE REDUCTION: This exam was performed according to the departmental dose-optimization program which includes automated exposure control, adjustment of the mA and/or kV according to  patient size and/or use of iterative reconstruction technique. COMPARISON:  Report from head CT 08/15/2015 (images unavailable). FINDINGS: Brain: Generalized parenchymal atrophy. Prominence of the ventricles and sulci appears commensurate. Patchy and ill-defined hypoattenuation within the cerebral white matter, nonspecific but compatible with moderate chronic small vessel ischemic disease. There is no acute intracranial hemorrhage. No demarcated cortical infarct. No extra-axial fluid collection. No evidence of an intracranial mass. No midline shift. Vascular: No hyperdense vessel.  Atherosclerotic calcifications. Skull: No calvarial fracture or aggressive osseous lesion. Sinuses/Orbits: No mass or acute finding within the imaged orbits. Minimal mucosal thickening within the right maxillary sinus at the imaged levels. Minimal mucosal thickening within the bilateral sphenoid and ethmoid sinuses IMPRESSION: 1. No evidence of an acute intracranial abnormality. 2. Parenchymal atrophy and chronic small vessel ischemic disease. 3. Minor paranasal sinus mucosal thickening at the imaged levels. Electronically Signed   By: Jackey Loge D.O.   On: 09/12/2023 17:12   VAS [place] ABI WITH/WO TBI Result Date: 09/12/2023  LOWER EXTREMITY DOPPLER STUDY Patient Name:  EREZ MCCALLUM  Date of Exam:   09/12/2023 Medical Rec #: [id number]      Accession #:    [id number] Date of Birth: [birth year]      Patient Gender: M Patient Age:   70 years Exam Location:  [facility] Procedure:      VAS [place] ABI WITH/WO TBI Referring Phys: Sherald Hess --------------------------------------------------------------------------------  Indications: Rest pain, and ulceration. High Risk Factors: Hypertension, hyperlipidemia, current smoker, coronary artery                    disease. Other Factors: Non compliance with medication and medical care since death of                his wife in 2017.  Limitations: Today's exam was limited due to Multiple  lines in left upper              extremity, skin texture, involuntary movement, and arrythmia Comparison Study: Prior study done at [facility] 09/07/23 Performing Technologist: Sherren Kerns RVS  Examination Guidelines: A complete evaluation includes at minimum, Doppler waveform signals and systolic blood pressure reading at the level of bilateral brachial, anterior tibial, and posterior tibial arteries, when  vessel segments are accessible. Bilateral testing is considered an integral part of a complete examination. Photoelectric Plethysmograph (PPG) waveforms and toe systolic pressure readings are included as required and additional duplex testing as needed. Limited examinations for reoccurring indications may be performed as noted.  ABI Findings: +---------+------------------+-----+-------------------+--------+ Right    Rt Pressure (mmHg)IndexWaveform           Comment  +---------+------------------+-----+-------------------+--------+ Brachial 147                    multiphasic                 +---------+------------------+-----+-------------------+--------+ PTA      51                0.35 dampened monophasic         +---------+------------------+-----+-------------------+--------+ DP       54                0.37 dampened monophasic         +---------+------------------+-----+-------------------+--------+ Great Toe0                 0.00 Absent                      +---------+------------------+-----+-------------------+--------+ +---------+------------------+-----+-------------------+---------------------+ Left     Lt Pressure (mmHg)IndexWaveform           Comment               +---------+------------------+-----+-------------------+---------------------+ Brachial                        multiphasic        multiple lines in arm +---------+------------------+-----+-------------------+---------------------+ PTA      47                0.32 dampened monophasic                       +---------+------------------+-----+-------------------+---------------------+ DP       33                0.22 dampened monophasic                      +---------+------------------+-----+-------------------+---------------------+ Great Toe0                 0.00 Absent                                   +---------+------------------+-----+-------------------+---------------------+ +-------+-----------+-----------+------------+------------+ ABI/TBIToday's ABIToday's TBIPrevious ABIPrevious TBI +-------+-----------+-----------+------------+------------+ Right  0.37       absent     0.32                     +-------+-----------+-----------+------------+------------+ Left   0.32       absent     0.29                     +-------+-----------+-----------+------------+------------+ Bilateral ABIs appear essentially unchanged compared to prior study on 09/07/2023.  Summary: Right: Resting right ankle-brachial index indicates severe right lower extremity arterial disease. Left: Resting left ankle-brachial index indicates severe left lower extremity arterial disease. *See table(s) above for measurements and observations.  Electronically signed by Sherald Hess MD on 09/12/2023 at 3:22:39 PM.    Final     DISCHARGE EXAMINATION: Vitals:   09/26/23 1610 09/26/23 1402 09/26/23 2007 09/27/23 0526  BP: Marland Kitchen)  146/86 (!) 134/92 (!) 146/83 133/77  Pulse:  94 86 81  Resp: 18 18 18 18   Temp: 97.7 F (36.5 C) 98 F (36.7 C) 98.5 F (36.9 C) 97.9 F (36.6 C)  TempSrc: Oral Oral Oral Oral  SpO2: 94% 95% 93% 94%  Weight:      Height:       General appearance: Awake alert.  In no distress Resp: Clear to auscultation bilaterally.  Normal effort Cardio: S1-S2 is normal regular.  No S3-S4.  No rubs murmurs or bruit GI: Abdomen is soft.  Nontender nondistended.  Bowel sounds are present normal.  No masses organomegaly   DISPOSITION: SNF  Discharge Instructions     Call MD  for:  difficulty breathing, headache or visual disturbances   Complete by: As directed    Call MD for:  extreme fatigue   Complete by: As directed    Call MD for:  persistant dizziness or light-headedness   Complete by: As directed    Call MD for:  persistant nausea and vomiting   Complete by: As directed    Call MD for:  redness, tenderness, or signs of infection (pain, swelling, redness, odor or green/yellow discharge around incision site)   Complete by: As directed    Call MD for:  severe uncontrolled pain   Complete by: As directed    Call MD for:  temperature >100.4   Complete by: As directed    Diet general   Complete by: As directed    Discharge instructions   Complete by: As directed    Please review instructions on the discharge summary.  You were cared for by a hospitalist during your hospital stay. If you have any questions about your discharge medications or the care you received while you were in the hospital after you are discharged, you can call the unit and asked to speak with the hospitalist on call if the hospitalist that took care of you is not available. Once you are discharged, your primary care physician will handle any further medical issues. Please note that NO REFILLS for any discharge medications will be authorized once you are discharged, as it is imperative that you return to your primary care physician (or establish a relationship with a primary care physician if you do not have one) for your aftercare needs so that they can reassess your need for medications and monitor your lab values. If you do not have a primary care physician, you can call [phone] for a physician referral.   Discharge wound care:   Complete by: As directed    Wound care  Daily      Comments: Paint LE wounds/heels with betadine, allow to air dry, top with foam Cover open wound right LE with single layer of xeroform, top with foam   Increase activity slowly   Complete by: As directed           Allergies as of 09/27/2023   No Known Allergies      Medication List     TAKE these medications    acetaminophen 325 MG tablet Commonly known as: TYLENOL Take 2 tablets (650 mg total) by mouth every 6 (six) hours as needed for mild pain (pain score 1-3) (or Fever >/= 101).   ascorbic acid 250 MG tablet Commonly known as: VITAMIN C Take 1 tablet (250 mg total) by mouth 2 (two) times daily.   aspirin EC 81 MG tablet Take 1 tablet (81 mg total) by mouth daily. Swallow  whole.   escitalopram 5 MG tablet Commonly known as: LEXAPRO Take 1 tablet (5 mg total) by mouth daily.   feeding supplement Liqd Take 237 mLs by mouth 2 (two) times daily between meals.   oxyCODONE-acetaminophen 5-325 MG tablet Commonly known as: PERCOCET/ROXICET Take 1 tablet by mouth every 4 (four) hours as needed for moderate pain (pain score 4-6).   rosuvastatin 20 MG tablet Commonly known as: CRESTOR Take 1 tablet (20 mg total) by mouth daily.   senna-docusate 8.6-50 MG tablet Commonly known as: Senokot-S Take 2 tablets by mouth at bedtime.   zinc sulfate (50mg  elemental zinc) 220 (50 Zn) MG capsule Take 1 capsule (220 mg total) by mouth daily.               Discharge Care Instructions  (From admission, onward)           Start     Ordered   09/24/23 0000  Discharge wound care:       Comments: Wound care  Daily      Comments: Paint LE wounds/heels with betadine, allow to air dry, top with foam Cover open wound right LE with single layer of xeroform, top with foam   09/24/23 1041              Follow-up Information     [name], MD Follow up.   Specialty: Vascular Surgery Why: If symptoms worsen Contact information: [address] [place] [address] [phone]                 TOTAL DISCHARGE TIME: 35 minutes  Azzure Garabedian Rito Ehrlich  [facility] Pager on [email or web address]  09/27/2023, 10:15 AM

## 2023-09-28 NOTE — Plan of Care (Signed)
  Problem: Clinical Measurements: Goal: Ability to maintain clinical measurements within normal limits will improve Outcome: Progressing   Problem: Nutrition: Goal: Adequate nutrition will be maintained Outcome: Progressing   Problem: Elimination: Goal: Will not experience complications related to bowel motility Outcome: Progressing Goal: Will not experience complications related to urinary retention Outcome: Progressing   Problem: Pain Managment: Goal: General experience of comfort will improve and/or be controlled Outcome: Progressing   Problem: Safety: Goal: Ability to remain free from injury will improve Outcome: Progressing

## 2023-09-28 NOTE — Discharge Summary (Signed)
[facility]  Physician Discharge Summary   Patient ID: Jack [name] MRN: [id number] DOB/AGE: Aug 12, 1946 78 y.o.  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)   Ulcers of both lower legs (HCC)   Hyperlipidemia LDL goal <70   Essential hypertension, benign   CKD stage 3a, GFR 45-59 ml/min (HCC)   Malnutrition of moderate degree   RECOMMENDATIONS FOR OUTPATIENT FOLLOW UP: CBC and basic metabolic panel in 1 week Daily wound care to wounds on both lower extremities   Home Health: SNF Equipment/Devices: None  CODE STATUS: DNR  DISCHARGE CONDITION: fair  Diet recommendation: Regular as tolerated  INITIAL HISTORY: 78 year old with a history of HTN, CAD status post CABG, and HLD who admits to not taking his usual medications or caring for himself appropriately since the death of his wife in 2015/11/01.  He was admitted to [facility] 09/03/2023 for evaluation of what was felt to be bilateral lower extremity cellulitis with ulcerations.  He underwent I&D at that facility and was treated with empiric antibiotics.  Wound cultures grew MRSA sensitive to doxycycline.  ABIs of the right leg were noted to be significantly diminished and it was felt the patient would likely require vascular intervention for wound healing.  Attempts were made to transfer him starting 1/13 but due to limited bed availability he did not arrive at [facility] until late night 09/11/2023.  On his arrival at [facility] the patient has noted to have extensive wounds to both lower extremities but with no evidence of acute infection at this time.  Most prominent are deep bilateral heel ulcers with a thick overlying eschar.  Vascular surgery and palliative care was consulted.    HOSPITAL COURSE:   Bilateral lower extremity ulcerations and deep tissue injury to bilateral heels PAD -ABIs obtained at  [facility] 1/18 are consistent with severe right and left lower extremity arterial disease at 0.37 and 0.32 respectively -Status post angiogram 1/24.   -Patient has been unable to tolerate MRI of the right foot/heel -Current recommendations are either palliative wound care of bilateral lower extremities versus bilateral AKA.  Family has had multiple conversations with vascular surgery and palliative care. Plan is to continue with palliative wound care rather than amputation.  Plan is for skilled nursing facility for short-term rehab and then to go home with his family.   He has completed course of antibiotics. Pain medications have been prescribed as needed.    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 (Active)  09/11/23 2350  Location: Sacrum  Location Orientation: Anterior;Mid  Staging: Stage 1 -  Intact skin with non-blanchable redness of a localized area usually over a bony prominence.  Wound Description (  Comments):   Present on Admission: Yes  Dressing Type Foam - Lift dressing to assess site every shift 09/22/23 2000     Pressure Injury 09/18/23 Buttocks Left Stage 2 -  Partial thickness loss of dermis presenting as a shallow open injury with a red, pink wound bed without slough.  (Active)  09/18/23 2200  Location: Buttocks  Location Orientation: Left  Staging: Stage 2 -  Partial thickness loss of dermis presenting as a shallow open injury with a red, pink wound bed without slough.  Wound Description (Comments):   Present on Admission:   Dressing Type Foam - Lift dressing to assess site every shift 09/22/23 2000     Pressure Injury 09/19/23 Heel Right Unstageable - Full thickness tissue loss in which the base of the injury is covered by slough (yellow, tan, gray, green or brown) and/or eschar (tan, brown or black) in the wound bed. (Active)  09/19/23 1736  Location: Heel  Location Orientation: Right  Staging: Unstageable - Full thickness tissue loss in which the base of the injury is covered by slough (yellow, tan, gray, green or brown) and/or eschar (tan, brown or black) in the wound bed.  Wound Description (Comments):   Present on Admission: Yes  Dressing Type Foam - Lift dressing to assess site every shift 09/22/23 2000    Moderate protein calorie malnutrition Nutrition Problem: Moderate Malnutrition Etiology: chronic illness, social / environmental circumstances  Patient denies any complaints this morning.  Has good appetite.  Looking forward to going to rehab soon.  Remains stable for discharge.  PERTINENT LABS:  The results of significant diagnostics from this hospitalization (including imaging, microbiology, ancillary and laboratory) are listed below for reference.     Labs:   Basic Metabolic Panel: Recent Labs  Lab 09/23/23 0344 09/26/23 0512  NA 135 136  K 4.5 4.2  CL 98 99  CO2 26 24  GLUCOSE 104* 101*  BUN 15 17  CREATININE 0.98 0.90  CALCIUM 8.5* 8.9  MG  --  2.0    CBC: Recent Labs  Lab 09/23/23 0344 09/26/23 0512  WBC 9.4 10.5  HGB 11.9* 12.6*  HCT 36.5* 38.2*  MCV 98.1 97.0  PLT 299 259     IMAGING STUDIES VAS [place] LOWER EXTREMITY SAPHENOUS VEIN MAPPING Result Date: 09/21/2023 LOWER EXTREMITY VEIN MAPPING Patient  Name:  Jack [name]  Date of Exam:   09/18/2023 Medical Rec #: [id number]      Accession #:    [id number] Date of Birth: 11-12-1945      Patient Gender: M Patient Age:   43 years Exam Location:  [facility] Procedure:      VAS [place] LOWER EXTREMITY SAPHENOUS VEIN MAPPING Referring Phys: Sherald Hess --------------------------------------------------------------------------------  Indications: Pre-op History:     History of PAD; patient is pre-operative for lower extremity bypass              graft.  Limitations: Banadges and wounds of bilateral calf & ankle. Comparison Study: No previous exams Performing Technologist: Jody Hill RVT, RDMS  Examination Guidelines: A complete evaluation includes B-mode imaging, spectral Doppler, color Doppler, and power Doppler as needed of all accessible portions of each vessel. Bilateral testing is considered an integral part of a complete examination. Limited examinations for reoccurring indications may be performed as noted. +------------+--------------+----------------------+------------+--------------+ RT Diameter  RT Findings           GSV          LT Diameter  LT Findings       (  cm)                                            (cm)                   +------------+--------------+----------------------+------------+--------------+     0.68      branching       Saphenofemoral        0.45                                                    Junction                                  +------------+--------------+----------------------+------------+--------------+     0.63                      Proximal thigh        0.47                   +------------+--------------+----------------------+------------+--------------+     0.53      branching         Mid thigh           0.33      branching    +------------+--------------+----------------------+------------+--------------+     0.58                       Distal thigh         0.14                    +------------+--------------+----------------------+------------+--------------+     0.51                           Knee                     not visualized +------------+--------------+----------------------+------------+--------------+     0.48      branching         Prox calf           0.22                   +------------+--------------+----------------------+------------+--------------+     0.33      branching          Mid calf           0.40      branching    +------------+--------------+----------------------+------------+--------------+             not visualized     Distal calf                  not visualized +------------+--------------+----------------------+------------+--------------+             not visualized        Ankle                     not visualized +------------+--------------+----------------------+------------+--------------+ Diagnosing physician: Coral Else MD Electronically signed by Coral Else MD on 09/21/2023 at 5:42:42 PM.    Final    DG Foot Complete Right Result Date: 09/20/2023 CLINICAL DATA:  Osteomyelitis. EXAM:  RIGHT FOOT COMPLETE - 3+ VIEW COMPARISON:  Radiograph 09/03/2023 FINDINGS: Hammertoe deformity of the toes. Bony under mineralization. Slightly decreased density involving the plantar calcaneus with likely overlying wound. No frank erosive changes. Midfoot osteoarthritis with dorsal spurring. No soft tissue gas or radiopaque foreign body IMPRESSION: Slightly decreased density involving the plantar calcaneus suspicious for osteomyelitis. Electronically Signed   By: Narda Rutherford M.D.   On: 09/20/2023 15:33   PERIPHERAL VASCULAR CATHETERIZATION Result Date: 09/18/2023 Images from the original result were not included.   Patient name: ELROY SCHEMBRI           MRN: [id number]        DOB: 09/08/1945          Sex: male  09/18/2023 Pre-operative Diagnosis: Critical limb ischemia of bilateral lower extremities with bilateral heel wounds  Post-operative diagnosis:  Same Surgeon:  [name], MD Procedure Performed: 1.  Ultrasound-guided access right common femoral artery 2.  Aortogram with catheter selection of abdominal aorta 3.  Left lower extremity arteriogram with catheter selection of the left common femoral 4.  Right lower extremity arteriogram with catheter runoff from the right common femoral sheath  Indications: 78 year old male transferred from [facility] for evaluation of bilateral heel ulcers.  Previously discussed with family that I think he needs bilateral above-knee amputation versus palliative wound care.  He presents today for aortogram, lower extremity arteriogram after risk benefits discussed.  Findings:  Ultrasound-guided access right common femoral artery.  Diseased abdominal aorta with small aneurysm.   On the left he has a high-grade iliac stenosis at the junction of the common iliac and external iliac artery over 80%.  The left common femoral has a high-grade stenosis >80% and is calcified.  The profundus patent.  He has a flush SFA occlusion.  The SFA as well as the above and below-knee popliteal artery are all occluded.  He reconstitutes a tibial trifurcation.  AT is the dominant runoff into the foot and is widely patent.  The posterior tibial is also patent but has a high-grade stenosis in the distal calf.  On the right has significantly calcified iliac arteries.  He also has significant common femoral disease >60% with a patent profunda and flush SFA occlusion.  SFA is occluded with occluded above and below-knee popliteal artery.  AT reconstitutes and is the dominant runoff into the foot.             Procedure:  The patient was identified in the holding area and taken to room 8.  The patient was then placed supine on the table and prepped and draped in the usual sterile fashion.  A time out was called.  Ultrasound was used to evaluate the right common femoral artery.  It was patent .  A digital ultrasound  image was acquired.  A micropuncture needle was used to access the right common femoral artery under ultrasound guidance.  An 018 wire was advanced without resistance and a micropuncture sheath was placed.  The 018 wire was removed and a benson wire was placed.  The micropuncture sheath was exchanged for a 5 french sheath.  An omniflush catheter was advanced over the wire to the level of L-1.  An abdominal angiogram was obtained.  I then went across the aortic bifurcation with an Omni Flush catheter and the wire would not advance.  I changed to a glidewire and crossing catheter..  The catheter was placed into theleft common femoral artery and left runoff was obtained.  We then removed wires and catheters and got right leg runoff from the sheath in the right groin.  Pertinent findings are noted above.  He has no endovascular options.  Plan: Bilateral flush SFA occlusion with common femoral disease and no endovascular option.  I reviewed his case with several of my partners.  Ultimately I think he needs a left above-knee amputation as the left heel eschar is extensive and I do not think the foot is salvageable.  If there is a foot to save it would be the right foot and this would require common femoral endarterectomy with femoral to anterior tibial bypass.  Even this would be high risk for no wound healing long-term.  Have discussed getting foot MRI to ensure no osteomyelitis in the heel.   [name], MD [facility] Office: [phone]   CT HEAD WO CONTRAST ( ) Result Date: 09/12/2023 CLINICAL DATA:  Provided history: Delirium. EXAM: CT HEAD WITHOUT CONTRAST TECHNIQUE: Contiguous axial images were obtained from the base of the skull through the vertex without intravenous contrast. RADIATION DOSE REDUCTION: This exam was performed according to the departmental dose-optimization program which includes automated exposure control, adjustment of the mA and/or kV according to  patient size and/or use of iterative reconstruction technique. COMPARISON:  Report from head CT 08/15/2015 (images unavailable). FINDINGS: Brain: Generalized parenchymal atrophy. Prominence of the ventricles and sulci appears commensurate. Patchy and ill-defined hypoattenuation within the cerebral white matter, nonspecific but compatible with moderate chronic small vessel ischemic disease. There is no acute intracranial hemorrhage. No demarcated cortical infarct. No extra-axial fluid collection. No evidence of an intracranial mass. No midline shift. Vascular: No hyperdense vessel.  Atherosclerotic calcifications. Skull: No calvarial fracture or aggressive osseous lesion. Sinuses/Orbits: No mass or acute finding within the imaged orbits. Minimal mucosal thickening within the right maxillary sinus at the imaged levels. Minimal mucosal thickening within the bilateral sphenoid and ethmoid sinuses IMPRESSION: 1. No evidence of an acute intracranial abnormality. 2. Parenchymal atrophy and chronic small vessel ischemic disease. 3. Minor paranasal sinus mucosal thickening at the imaged levels. Electronically Signed   By: Jackey Loge D.O.   On: 09/12/2023 17:12   VAS [place] ABI WITH/WO TBI Result Date: 09/12/2023  LOWER EXTREMITY DOPPLER STUDY Patient Name:  NYGEL PROKOP  Date of Exam:   09/12/2023 Medical Rec #: [id number]      Accession #:    [id number] Date of Birth: 1946/04/25      Patient Gender: M Patient Age:   40 years Exam Location:  [facility] Procedure:      VAS [place] ABI WITH/WO TBI Referring Phys: Sherald Hess --------------------------------------------------------------------------------  Indications: Rest pain, and ulceration. High Risk Factors: Hypertension, hyperlipidemia, current smoker, coronary artery                    disease. Other Factors: Non compliance with medication and medical care since death of                his wife in 2017.  Limitations: Today's exam was limited due to Multiple  lines in left upper              extremity, skin texture, involuntary movement, and arrythmia Comparison Study: Prior study done at [facility] 09/07/23 Performing Technologist: Sherren Kerns RVS  Examination Guidelines: A complete evaluation includes at minimum, Doppler waveform signals and systolic blood pressure reading at the level of bilateral brachial, anterior tibial, and posterior tibial arteries, when  vessel segments are accessible. Bilateral testing is considered an integral part of a complete examination. Photoelectric Plethysmograph (PPG) waveforms and toe systolic pressure readings are included as required and additional duplex testing as needed. Limited examinations for reoccurring indications may be performed as noted.  ABI Findings: +---------+------------------+-----+-------------------+--------+ Right    Rt Pressure (mmHg)IndexWaveform           Comment  +---------+------------------+-----+-------------------+--------+ Brachial 147                    multiphasic                 +---------+------------------+-----+-------------------+--------+ PTA      51                0.35 dampened monophasic         +---------+------------------+-----+-------------------+--------+ DP       54                0.37 dampened monophasic         +---------+------------------+-----+-------------------+--------+ Great Toe0                 0.00 Absent                      +---------+------------------+-----+-------------------+--------+ +---------+------------------+-----+-------------------+---------------------+ Left     Lt Pressure (mmHg)IndexWaveform           Comment               +---------+------------------+-----+-------------------+---------------------+ Brachial                        multiphasic        multiple lines in arm +---------+------------------+-----+-------------------+---------------------+ PTA      47                0.32 dampened monophasic                       +---------+------------------+-----+-------------------+---------------------+ DP       33                0.22 dampened monophasic                      +---------+------------------+-----+-------------------+---------------------+ Great Toe0                 0.00 Absent                                   +---------+------------------+-----+-------------------+---------------------+ +-------+-----------+-----------+------------+------------+ ABI/TBIToday's ABIToday's TBIPrevious ABIPrevious TBI +-------+-----------+-----------+------------+------------+ Right  0.37       absent     0.32                     +-------+-----------+-----------+------------+------------+ Left   0.32       absent     0.29                     +-------+-----------+-----------+------------+------------+ Bilateral ABIs appear essentially unchanged compared to prior study on 09/07/2023.  Summary: Right: Resting right ankle-brachial index indicates severe right lower extremity arterial disease. Left: Resting left ankle-brachial index indicates severe left lower extremity arterial disease. *See table(s) above for measurements and observations.  Electronically signed by Sherald Hess MD on 09/12/2023 at 3:22:39 PM.    Final     DISCHARGE EXAMINATION: Vitals:   09/27/23 1557 09/27/23 2018 09/28/23 2841 09/28/23 0829  BP: Marland Kitchen)  141/92 (!) 141/87 (!) 156/85 (!) 141/83  Pulse: 96 90 86 85  Resp: 18 18 18 18   Temp: 98.1 F (36.7 C) 98.5 F (36.9 C) 98.2 F (36.8 C) 98.4 F (36.9 C)  TempSrc: Oral Oral Oral Oral  SpO2: 96% 95% 95% 96%  Weight:      Height:       General appearance: Awake alert.  In no distress Resp: Clear to auscultation bilaterally.  Normal effort Cardio: S1-S2 is normal regular.  No S3-S4.  No rubs murmurs or bruit GI: Abdomen is soft.  Nontender nondistended.  Bowel sounds are present normal.  No masses organomegaly    DISPOSITION: SNF  Discharge Instructions      Call MD for:  difficulty breathing, headache or visual disturbances   Complete by: As directed    Call MD for:  extreme fatigue   Complete by: As directed    Call MD for:  persistant dizziness or light-headedness   Complete by: As directed    Call MD for:  persistant nausea and vomiting   Complete by: As directed    Call MD for:  redness, tenderness, or signs of infection (pain, swelling, redness, odor or green/yellow discharge around incision site)   Complete by: As directed    Call MD for:  severe uncontrolled pain   Complete by: As directed    Call MD for:  temperature >100.4   Complete by: As directed    Diet general   Complete by: As directed    Discharge instructions   Complete by: As directed    Please review instructions on the discharge summary.  You were cared for by a hospitalist during your hospital stay. If you have any questions about your discharge medications or the care you received while you were in the hospital after you are discharged, you can call the unit and asked to speak with the hospitalist on call if the hospitalist that took care of you is not available. Once you are discharged, your primary care physician will handle any further medical issues. Please note that NO REFILLS for any discharge medications will be authorized once you are discharged, as it is imperative that you return to your primary care physician (or establish a relationship with a primary care physician if you do not have one) for your aftercare needs so that they can reassess your need for medications and monitor your lab values. If you do not have a primary care physician, you can call [phone] for a physician referral.   Discharge wound care:   Complete by: As directed    Wound care  Daily      Comments: Paint LE wounds/heels with betadine, allow to air dry, top with foam Cover open wound right LE with single layer of xeroform, top with foam   Increase activity slowly   Complete by: As  directed          Allergies as of 09/28/2023   No Known Allergies      Medication List     TAKE these medications    acetaminophen 325 MG tablet Commonly known as: TYLENOL Take 2 tablets (650 mg total) by mouth every 6 (six) hours as needed for mild pain (pain score 1-3) (or Fever >/= 101).   ascorbic acid 250 MG tablet Commonly known as: VITAMIN C Take 1 tablet (250 mg total) by mouth 2 (two) times daily.   aspirin EC 81 MG tablet Take 1 tablet (81 mg total) by mouth  daily. Swallow whole.   escitalopram 5 MG tablet Commonly known as: LEXAPRO Take 1 tablet (5 mg total) by mouth daily.   feeding supplement Liqd Take 237 mLs by mouth 2 (two) times daily between meals.   oxyCODONE-acetaminophen 5-325 MG tablet Commonly known as: PERCOCET/ROXICET Take 1 tablet by mouth every 4 (four) hours as needed for moderate pain (pain score 4-6).   rosuvastatin 20 MG tablet Commonly known as: CRESTOR Take 1 tablet (20 mg total) by mouth daily.   senna-docusate 8.6-50 MG tablet Commonly known as: Senokot-S Take 2 tablets by mouth at bedtime.   zinc sulfate (50mg  elemental zinc) 220 (50 Zn) MG capsule Take 1 capsule (220 mg total) by mouth daily.               Discharge Care Instructions  (From admission, onward)           Start     Ordered   09/24/23 0000  Discharge wound care:       Comments: Wound care  Daily      Comments: Paint LE wounds/heels with betadine, allow to air dry, top with foam Cover open wound right LE with single layer of xeroform, top with foam   09/24/23 1041              Follow-up Information     [name], MD Follow up.   Specialty: Vascular Surgery Why: If symptoms worsen Contact information: [address] [place] [address] [phone]                 TOTAL DISCHARGE TIME: 35 minutes  Troyce Gieske Rito Ehrlich  [facility] Pager on [email or web address]  09/28/2023, 9:49 AM

## 2023-09-28 NOTE — TOC Progression Note (Addendum)
Transition of Care [facility]) - Progression Note    Patient Details  Name: Jack [name] MRN: [id number] Date of Birth: 1946/03/24  Transition of Care [facility]) CM/SW Contact  Lorri Frederick, LCSW Phone Number: 09/28/2023, 8:30 AM  Clinical Narrative:   SNF auth request remains pending at this time.   1150: auth request still pending  1530: Auth request remains pending in [place].   Expected Discharge Plan: Skilled Nursing Facility Barriers to Discharge: Continued Medical Work up  Expected Discharge Plan and Services                                               Social Determinants of Health (SDOH) Interventions SDOH Screenings   Food Insecurity: No Food Insecurity (09/11/2023)  Housing: Low Risk  (09/11/2023)  Transportation Needs: No Transportation Needs (09/11/2023)  Utilities: Not At Risk (09/11/2023)  Alcohol Screen: Low Risk  (02/02/2018)  Depression (PHQ2-9): Low Risk  (04/10/2020)  Financial Resource Strain: Low Risk  (09/04/2023)   Received from [facility]  Social Connections: Socially Isolated (09/11/2023)  Tobacco Use: High Risk (09/22/2023)    Readmission Risk Interventions     No data to display

## 2023-09-28 NOTE — Progress Notes (Signed)
Physical Therapy Treatment Patient Details Name: Jack [name] MRN: [id number] DOB: 1946-05-31 Today's Date: 09/28/2023   History of Present Illness 78 y.o. male presents to [facility] from [facility] 09/11/23 for vascular surgery consult/intervention for B LE. Pt admitted w/ deep bilateral heel ulcers w/ eschar. LE angiogram scheduled for 1/24. Palliative following. PMHx: HTN, CA s/p CABG, HLD.    PT Comments  Pt received in supine, agreeable to therapy session with encouragement, with pt reluctant to mobilize OOB due to c/o fatigue and BLE pain. Pt encouraged to eat more from lunch day to assist with wound healing but reports it is not appetizing, son present and assisting him to call down to order alternate meal. Pt needing up to totalA +2 for OOB to chair via hoyer lift and minA for rolling to L/R sides with cues for placement of lift pad in supine. Pt needing min to modA for reclined posture to long sitting for brief period of time, pt fatigues quickly in upright, only sitting up a few seconds before resting back against chair/bed. Pt will continue to benefit from skilled rehab in a post acute setting < 3 hours per day to maximize functional gains before returning home.     If plan is discharge home, recommend the following: A lot of help with bathing/dressing/bathroom;Assistance with cooking/housework;Assist for transportation;Help with stairs or ramp for entrance;Two people to help with walking and/or transfers;Supervision due to cognitive status;Direct supervision/assist for financial management;Direct supervision/assist for medications management   Can travel by private vehicle     No  Equipment Recommendations  None recommended by PT;Other (comment) (if home, pt would need mechanical lift and hospital bed with air mattress)    Recommendations for Other Services       Precautions / Restrictions Precautions Precautions: Fall Precaution Comments: Contact precs; B LE wounds, eschar on B  heels Required Braces or Orthoses: Other Brace Other Brace: prevalon boots; float heels Restrictions Weight Bearing Restrictions Per Provider Order: No Other Position/Activity Restrictions: still full WB allowed per Dr Alvino Chapel 1/23     Mobility  Bed Mobility Overal bed mobility: Needs Assistance Bed Mobility: Rolling Rolling: Min assist, Used rails   Supine to sit: Mod assist, +2 for safety/equipment, Used rails, HOB elevated     General bed mobility comments: cues for pt to assist as without this he just lets himself be moved; +2 safety to long sitting in bed; pt not able to tolerate unsupported sitting needs at least +1 for trunk support in long sitting; posterior pelvic tilt    Transfers Overall transfer level: Needs assistance   Transfers: Bed to chair/wheelchair/BSC             General transfer comment: Pt requiring transfer with [place] +2 for safety/physical assist this session due to fatigue. Transfer via Lift Equipment: Maximove  Ambulation/Gait               General Gait Details: pt reports he has not been able to ambulate for months   Stairs             Wheelchair Mobility     Tilt Bed    Modified Rankin (Stroke Patients Only)       Balance Overall balance assessment: Needs assistance Sitting-balance support: Single extremity supported, Bilateral upper extremity supported, Feet supported Sitting balance-Leahy Scale: Fair Sitting balance - Comments: CGA to sit upright in chair, pt fatigues quickly and requests to rest back against his chair Postural control: Posterior lean     Standing  balance comment: unable                            Cognition Arousal: Alert Behavior During Therapy: WFL for tasks assessed/performed Overall Cognitive Status: No family/caregiver present to determine baseline cognitive functioning Area of Impairment: Attention, Following commands, Safety/judgement, Awareness, Problem solving                    Current Attention Level: Selective   Following Commands: Follows one step commands consistently, Follows one step commands with increased time, Follows multi-step commands inconsistently, Follows multi-step commands with increased time Safety/Judgement: Decreased awareness of safety, Decreased awareness of deficits Awareness: Emergent Problem Solving: Slow processing, Requires verbal cues General Comments: A&O to self and situation (not further assessed) and pleasant throughout session. Requires increased time for processing and verbal cues for sequencing and safety, pt also HoH.        Exercises General Exercises - Lower Extremity Ankle Circles/Pumps: AROM, Both, 10 reps, Supine Long Arc Quad: AROM, Both, 5 reps, Seated (in recliner) Straight Leg Raises: Both, 5 reps, Supine, AROM Other Exercises Other Exercises: cues for use of BUE on chair arm rests for chair push-ups PRN as bottom begins to hurt, pt agreeable but did not want to try during session    General Comments General comments (skin integrity, edema, etc.): Strong smell coming from wound/limb wrappings; no acute s/sx distress and VSS per chart review on RA      Pertinent Vitals/Pain Pain Assessment Pain Assessment: Faces Faces Pain Scale: Hurts little more Pain Location: heels and bottom Pain Descriptors / Indicators: Grimacing, Guarding Pain Intervention(s): Limited activity within patient's tolerance, Monitored during session, Repositioned    Home Living                          Prior Function            PT Goals (current goals can now be found in the care plan section) Acute Rehab PT Goals Patient Stated Goal: to go back home PT Goal Formulation: Patient unable to participate in goal setting Time For Goal Achievement: 09/27/23 (supervising PT notified goals due) Progress towards PT goals: Progressing toward goals    Frequency    Min 1X/week      PT Plan      Co-evaluation               AM-PAC PT "6 Clicks" Mobility   Outcome Measure  Help needed turning from your back to your side while in a flat bed without using bedrails?: A Lot Help needed moving from lying on your back to sitting on the side of a flat bed without using bedrails?: A Lot Help needed moving to and from a bed to a chair (including a wheelchair)?: Total Help needed standing up from a chair using your arms (e.g., wheelchair or bedside chair)?: Total Help needed to walk in hospital room?: Total Help needed climbing 3-5 steps with a railing? : Total 6 Click Score: 8    End of Session Equipment Utilized During Treatment: Other (comment) (prevalon boots, hoyer lift pad) Activity Tolerance: Patient limited by fatigue Patient left: with call bell/phone within reach;in chair;with chair alarm set;with family/visitor present (in recliner, son present) Nurse Communication: Mobility status;Need for lift equipment PT Visit Diagnosis: Other abnormalities of gait and mobility (R26.89);Muscle weakness (generalized) (M62.81);Difficulty in walking, not elsewhere classified (R26.2);Adult, failure to thrive (R62.7)  Time: 7829-5621 PT Time Calculation (min) (ACUTE ONLY): 25 min  Charges:    $Therapeutic Activity: 23-37 mins PT General Charges $$ ACUTE PT VISIT: 1 Visit                     Mikhail Hallenbeck P., PTA Acute Rehabilitation Services Secure Chat Preferred 9a-5:30pm Office: [phone]    Dorathy Kinsman [facility] 09/28/2023, 4:18 PM

## 2023-09-29 NOTE — Progress Notes (Signed)
 Occupational Therapy Treatment Patient Details Name: Jack [name] MRN: [id number] DOB: [birth year] Today's Date: 09/29/2023   History of present illness 77 y.o. male presents to [facility] from [facility] 09/11/23 for vascular surgery consult/intervention for B LE. Pt admitted w/ deep bilateral heel ulcers w/ eschar. LE angiogram scheduled for 1/24. Palliative following. PMHx: HTN, CA s/p CABG, HLD.   OT comments  Pt with good progression toward established OT goals with goals updated this session. Focus session on optimizing activity tolerance and strengthening in preparation for transfers and ADL. Pt able to perform bed mobility with mod A this session and lateral scoots up toward HOB. Emphasized anterior weight shift with scoots and chair push ups to optimize weightbearing on BLE and pt with good tolerance. Will continue to follow. Current dc plan of inpatient rehab <3 hours/day remains appropriate.       If plan is discharge home, recommend the following:  Two people to help with walking and/or transfers;Two people to help with bathing/dressing/bathroom;Assistance with cooking/housework;Assist for transportation;Help with stairs or ramp for entrance;Direct supervision/assist for financial management;Direct supervision/assist for medications management   Equipment Recommendations  Other (comment) (defer to next level of care)    Recommendations for Other Services      Precautions / Restrictions Precautions Precautions: Fall Precaution Comments: Contact precs; B LE wounds, eschar on B heels Required Braces or Orthoses: Other Brace Other Brace: prevalon boots; float heels Restrictions Weight Bearing Restrictions Per Provider Order: No Other Position/Activity Restrictions: still full WB allowed per Dr Rojelio 1/23       Mobility Bed Mobility Overal bed mobility: Needs Assistance Bed Mobility: Supine to Sit, Sit to Supine     Supine to sit: Mod assist, +2 for physical assistance, +2 for  safety/equipment, Used rails Sit to supine: Min assist   General bed mobility comments: cues for optimizing technique/sequence. Assist for scooting hips toward EOB and truncal elvation. Much improved this session.    Transfers Overall transfer level: Needs assistance                 General transfer comment: worked on lateral scoot transfer up to [facility] ~2 ft and pt performing with min A +2 with cues for appropriate weight shift and technique     Balance Overall balance assessment: Needs assistance Sitting-balance support: Single extremity supported, Bilateral upper extremity supported, Feet supported Sitting balance-Leahy Scale: Fair Sitting balance - Comments: supervision for ~10 mins at EOB                                   ADL either performed or assessed with clinical judgement   ADL Overall ADL's : Needs assistance/impaired     Grooming: Wash/dry face;Set up;Sitting Grooming Details (indicate cue type and reason): EOB             Lower Body Dressing: Total assistance Lower Body Dressing Details (indicate cue type and reason): donning socks EOB Toilet Transfer: Minimal assistance;+2 for safety/equipment;+2 for physical assistance Toilet Transfer Details (indicate cue type and reason): lateral scoot toward [facility]                Extremity/Trunk Assessment Upper Extremity Assessment Upper Extremity Assessment: Generalized weakness   Lower Extremity Assessment Lower Extremity Assessment: Defer to PT evaluation        Vision       Perception     Praxis      Cognition Arousal: Alert  Behavior During Therapy: WFL for tasks assessed/performed Overall Cognitive Status: No family/caregiver present to determine baseline cognitive functioning Area of Impairment: Attention, Following commands, Safety/judgement, Awareness, Problem solving                   Current Attention Level: Selective   Following Commands: Follows one step  commands consistently, Follows one step commands with increased time, Follows multi-step commands inconsistently, Follows multi-step commands with increased time Safety/Judgement: Decreased awareness of safety, Decreased awareness of deficits Awareness: Emergent Problem Solving: Slow processing, Requires verbal cues General Comments: A&O to self and situation. Pleasant and conversational this session. Benefits from encouragement and cues during mobility to optimize safety        Exercises Exercises: General Lower Extremity, Other exercises General Exercises - Lower Extremity Ankle Circles/Pumps: AROM, Both, 10 reps, Seated Long Arc Quad: AROM, Both, Seated, 10 reps Hip Flexion/Marching: AROM, 10 reps, Seated Other Exercises Other Exercises: Chair push ups x5 prior to lateral scooots toward L up toward [facility]    Shoulder Instructions       General Comments      Pertinent Vitals/ Pain       Pain Assessment Pain Assessment: Faces Faces Pain Scale: Hurts little more Pain Location: heels and bottom Pain Descriptors / Indicators: Grimacing, Guarding Pain Intervention(s): Limited activity within patient's tolerance, Monitored during session  Home Living                                          Prior Functioning/Environment              Frequency  Min 1X/week        Progress Toward Goals  OT Goals(current goals can now be found in the care plan section)  Progress towards OT goals: Progressing toward goals  Acute Rehab OT Goals Patient Stated Goal: get better OT Goal Formulation: With patient Time For Goal Achievement: 10/13/23 Potential to Achieve Goals: Fair ADL Goals Pt Will Perform Eating: with supervision;sitting Pt Will Perform Grooming: with supervision;sitting Pt Will Perform Upper Body Bathing: with set-up;sitting Pt Will Perform Lower Body Bathing: with min assist;sitting/lateral leans Pt Will Transfer to Toilet: with mod assist;with +2  assist;stand pivot transfer Additional ADL Goal #1: Pt will perform bed mobility with supervision A as precursor to ADL  Plan      Co-evaluation                 AM-PAC OT 6 Clicks Daily Activity     Outcome Measure   Help from another person eating meals?: A Little Help from another person taking care of personal grooming?: A Little Help from another person toileting, which includes using toliet, bedpan, or urinal?: Total Help from another person bathing (including washing, rinsing, drying)?: A Lot Help from another person to put on and taking off regular upper body clothing?: A Little Help from another person to put on and taking off regular lower body clothing?: Total 6 Click Score: 13    End of Session Equipment Utilized During Treatment: Gait belt  OT Visit Diagnosis: Muscle weakness (generalized) (M62.81);Other symptoms and signs involving cognitive function;Other (comment) (decreased activity tolerance)   Activity Tolerance Patient tolerated treatment well   Patient Left in bed;with call bell/phone within reach;with bed alarm set   Nurse Communication Mobility status;Other (comment) (family present has questions)        Time: [phone]  OT Time Calculation (min): 25 min  Charges: OT General Charges $OT Visit: 1 Visit OT Treatments $Self Care/Home Management : 8-22 mins $Therapeutic Exercise: 8-22 mins  Elma JONETTA Lebron FREDERICK, OTR/L [facility] Acute Rehabilitation Office: [phone]   Elma JONETTA Lebron 09/29/2023, 4:54 PM

## 2023-09-29 NOTE — Discharge Summary (Signed)
 [facility]  Physician Discharge Summary   Patient ID: Jack [name] MRN: [id number] DOB/AGE: [birth year] 78 y.o.  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)   Ulcers of both lower legs (HCC)   Hyperlipidemia LDL goal <70   Essential hypertension, benign   CKD stage 3a, GFR 45-59 ml/min (HCC)   Malnutrition of moderate degree   RECOMMENDATIONS FOR OUTPATIENT FOLLOW UP: CBC and basic metabolic panel in 1 week Daily wound care to wounds on both lower extremities   Home Health: SNF Equipment/Devices: None  CODE STATUS: DNR  DISCHARGE CONDITION: fair  Diet recommendation: Regular as tolerated  INITIAL HISTORY: 78 year old with a history of HTN, CAD status post CABG, and HLD who admits to not taking his usual medications or caring for himself appropriately since the death of his wife in Aug 09, 2016.  He was admitted to [facility] 09/03/2023 for evaluation of what was felt to be bilateral lower extremity cellulitis with ulcerations.  He underwent I&D at that facility and was treated with empiric antibiotics.  Wound cultures grew MRSA sensitive to doxycycline.  ABIs of the right leg were noted to be significantly diminished and it was felt the patient would likely require vascular intervention for wound healing.  Attempts were made to transfer him starting 1/13 but due to limited bed availability he did not arrive at [facility] until late night 09/11/2023.  On his arrival at [facility] the patient has noted to have extensive wounds to both lower extremities but with no evidence of acute infection at this time.  Most prominent are deep bilateral heel ulcers with a thick overlying eschar.  Vascular surgery and palliative care was consulted.    HOSPITAL COURSE:   Bilateral lower extremity ulcerations and deep tissue injury to bilateral heels PAD ABIs obtained at [facility]  1/18 are consistent with severe right and left lower extremity arterial disease at 0.37 and 0.32 respectively. Status post angiogram 1/24.   Patient has been unable to tolerate MRI of the right foot/heel Patient and family were offered either palliative wound care of bilateral lower extremities versus bilateral AKA.  Family has had multiple conversations with vascular surgery and palliative care. Plan is to continue with palliative wound care rather than amputation.  Plan is for skilled nursing facility for short-term rehab and then to go home with his family.   He has completed course of antibiotics. Pain medications have been prescribed as needed.    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 (Active)  09/11/23 2350  Location: Sacrum  Location Orientation: Anterior;Mid  Staging: Stage 1 -  Intact skin with non-blanchable redness of a localized area usually over a bony prominence.  Wound Description (Comments):   Present on Admission: Yes  Dressing Type Foam - Lift dressing to assess site every shift 09/22/23 2000     Pressure Injury 09/18/23 Buttocks Left Stage 2 -  Partial thickness loss of dermis presenting as a shallow open injury with a red, pink wound bed without slough. (Active)   09/18/23 2200  Location: Buttocks  Location Orientation: Left  Staging: Stage 2 -  Partial thickness loss of dermis presenting as a shallow open injury with a red, pink wound bed without slough.  Wound Description (Comments):   Present on Admission:   Dressing Type Foam - Lift dressing to assess site every shift 09/22/23 2000     Pressure Injury 09/19/23 Heel Right Unstageable - Full thickness tissue loss in which the base of the injury is covered by slough (yellow, tan, gray, green or brown) and/or eschar (tan, brown or black) in the wound bed. (Active)  09/19/23 1736  Location: Heel  Location Orientation: Right  Staging: Unstageable - Full thickness tissue loss in which the base of the injury is covered by slough (yellow, tan, gray, green or brown) and/or eschar (tan, brown or black) in the wound bed.  Wound Description (Comments):   Present on Admission: Yes  Dressing Type Foam - Lift dressing to assess site every shift 09/22/23 2000    Moderate protein calorie malnutrition Nutrition Problem: Moderate Malnutrition Etiology: chronic illness, social / environmental circumstances  Patient without any complaints this morning.  Remains stable for discharge.  Await insurance authorization for SNF.  PERTINENT LABS:  The results of significant diagnostics from this hospitalization (including imaging, microbiology, ancillary and laboratory) are listed below for reference.     Labs:   Basic Metabolic Panel: Recent Labs  Lab 09/23/23 0344 09/26/23 0512  NA 135 136  K 4.5 4.2  CL 98 99  CO2 26 24  GLUCOSE 104* 101*  BUN 15 17  CREATININE 0.98 0.90  CALCIUM  8.5* 8.9  MG  --  2.0    CBC: Recent Labs  Lab 09/23/23 0344 09/26/23 0512  WBC 9.4 10.5  HGB 11.9* 12.6*  HCT 36.5* 38.2*  MCV 98.1 97.0  PLT 299 259     IMAGING STUDIES VAS US  LOWER EXTREMITY SAPHENOUS VEIN MAPPING Result Date: 09/21/2023 LOWER EXTREMITY VEIN MAPPING Patient Name:  Jack [name]  Date of  Exam:   09/18/2023 Medical Rec #: [id number]      Accession #:    [id number] Date of Birth: 1945/10/13      Patient Gender: M Patient Age:   78 years Exam Location:  [facility] Procedure:      VAS US  LOWER EXTREMITY SAPHENOUS VEIN MAPPING Referring Phys: LONNI GASKINS --------------------------------------------------------------------------------  Indications: Pre-op History:     History of PAD; patient is pre-operative for lower extremity bypass              graft.  Limitations: Banadges and wounds of bilateral calf & ankle. Comparison Study: No previous exams Performing Technologist: Jody Hill RVT, RDMS  Examination Guidelines: A complete evaluation includes B-mode imaging, spectral Doppler, color Doppler, and power Doppler as needed of all accessible portions of each vessel. Bilateral testing is considered an integral part of a complete examination. Limited examinations for reoccurring indications may be performed as noted. +------------+--------------+----------------------+------------+--------------+ RT Diameter  RT Findings           GSV          LT Diameter  LT Findings       (  cm)                                            (cm)                   +------------+--------------+----------------------+------------+--------------+     0.68      branching       Saphenofemoral        0.45                                                    Junction                                  +------------+--------------+----------------------+------------+--------------+     0.63                      Proximal thigh        0.47                   +------------+--------------+----------------------+------------+--------------+     0.53      branching         Mid thigh           0.33      branching    +------------+--------------+----------------------+------------+--------------+     0.58                       Distal thigh         0.14                    +------------+--------------+----------------------+------------+--------------+     0.51                           Knee                     not visualized +------------+--------------+----------------------+------------+--------------+     0.48      branching         Prox calf           0.22                   +------------+--------------+----------------------+------------+--------------+     0.33      branching          Mid calf           0.40      branching    +------------+--------------+----------------------+------------+--------------+             not visualized     Distal calf                  not visualized +------------+--------------+----------------------+------------+--------------+             not visualized        Ankle                     not visualized +------------+--------------+----------------------+------------+--------------+ Diagnosing physician: Gaile New MD Electronically signed by Gaile New MD on 09/21/2023 at 5:42:42 PM.    Final    DG Foot Complete Right Result Date: 09/20/2023 CLINICAL DATA:  Osteomyelitis. EXAM:  RIGHT FOOT COMPLETE - 3+ VIEW COMPARISON:  Radiograph 09/03/2023 FINDINGS: Hammertoe deformity of the toes. Bony under mineralization. Slightly decreased density involving the plantar calcaneus with likely overlying wound. No frank erosive changes. Midfoot osteoarthritis with dorsal spurring. No soft tissue gas or radiopaque foreign body IMPRESSION: Slightly decreased density involving the plantar calcaneus suspicious for osteomyelitis. Electronically Signed   By: Andrea Gasman M.D.   On: 09/20/2023 15:33   PERIPHERAL VASCULAR CATHETERIZATION Result Date: 09/18/2023 Images from the original result were not included.   Patient name: Jack [name]           MRN: [id number]        DOB: 1946/07/09          Sex: male  09/18/2023 Pre-operative Diagnosis: Critical limb ischemia of bilateral lower extremities with bilateral heel wounds  Post-operative diagnosis:  Same Surgeon:  Lonni DOROTHA Gaskins, MD Procedure Performed: 1.  Ultrasound-guided access right common femoral artery 2.  Aortogram with catheter selection of abdominal aorta 3.  Left lower extremity arteriogram with catheter selection of the left common femoral 4.  Right lower extremity arteriogram with catheter runoff from the right common femoral sheath  Indications: 78 year old male transferred from [facility] for evaluation of bilateral heel ulcers.  Previously discussed with family that I think he needs bilateral above-knee amputation versus palliative wound care.  He presents today for aortogram, lower extremity arteriogram after risk benefits discussed.  Findings:  Ultrasound-guided access right common femoral artery.  Diseased abdominal aorta with small aneurysm.   On the left he has a high-grade iliac stenosis at the junction of the common iliac and external iliac artery over 80%.  The left common femoral has a high-grade stenosis >80% and is calcified.  The profundus patent.  He has a flush SFA occlusion.  The SFA as well as the above and below-knee popliteal artery are all occluded.  He reconstitutes a tibial trifurcation.  AT is the dominant runoff into the foot and is widely patent.  The posterior tibial is also patent but has a high-grade stenosis in the distal calf.  On the right has significantly calcified iliac arteries.  He also has significant common femoral disease >60% with a patent profunda and flush SFA occlusion.  SFA is occluded with occluded above and below-knee popliteal artery.  AT reconstitutes and is the dominant runoff into the foot.             Procedure:  The patient was identified in the holding area and taken to room 8.  The patient was then placed supine on the table and prepped and draped in the usual sterile fashion.  A time out was called.  Ultrasound was used to evaluate the right common femoral artery.  It was patent .  A digital ultrasound  image was acquired.  A micropuncture needle was used to access the right common femoral artery under ultrasound guidance.  An 018 wire was advanced without resistance and a micropuncture sheath was placed.  The 018 wire was removed and a benson wire was placed.  The micropuncture sheath was exchanged for a 5 french sheath.  An omniflush catheter was advanced over the wire to the level of L-1.  An abdominal angiogram was obtained.  I then went across the aortic bifurcation with an Omni Flush catheter and the wire would not advance.  I changed to a glidewire and crossing catheter..  The catheter was placed into theleft common femoral artery and left runoff was obtained.  We then removed wires and catheters and got right leg runoff from the sheath in the right groin.  Pertinent findings are noted above.  He has no endovascular options.  Plan: Bilateral flush SFA occlusion with common femoral disease and no endovascular option.  I reviewed his case with several of my partners.  Ultimately I think he needs a left above-knee amputation as the left heel eschar is extensive and I do not think the foot is salvageable.  If there is a foot to save it would be the right foot and this would require common femoral endarterectomy with femoral to anterior tibial bypass.  Even this would be high risk for no wound healing long-term.  Have discussed getting foot MRI to ensure no osteomyelitis in the heel.   Lonni DOROTHA Gaskins, MD [facility] Office: [phone]   CT HEAD WO CONTRAST ( ) Result Date: 09/12/2023 CLINICAL DATA:  Provided history: Delirium. EXAM: CT HEAD WITHOUT CONTRAST TECHNIQUE: Contiguous axial images were obtained from the base of the skull through the vertex without intravenous contrast. RADIATION DOSE REDUCTION: This exam was performed according to the departmental dose-optimization program which includes automated exposure control, adjustment of the mA and/or kV according to  patient size and/or use of iterative reconstruction technique. COMPARISON:  Report from head CT 08/15/2015 (images unavailable). FINDINGS: Brain: Generalized parenchymal atrophy. Prominence of the ventricles and sulci appears commensurate. Patchy and ill-defined hypoattenuation within the cerebral white matter, nonspecific but compatible with moderate chronic small vessel ischemic disease. There is no acute intracranial hemorrhage. No demarcated cortical infarct. No extra-axial fluid collection. No evidence of an intracranial mass. No midline shift. Vascular: No hyperdense vessel.  Atherosclerotic calcifications. Skull: No calvarial fracture or aggressive osseous lesion. Sinuses/Orbits: No mass or acute finding within the imaged orbits. Minimal mucosal thickening within the right maxillary sinus at the imaged levels. Minimal mucosal thickening within the bilateral sphenoid and ethmoid sinuses IMPRESSION: 1. No evidence of an acute intracranial abnormality. 2. Parenchymal atrophy and chronic small vessel ischemic disease. 3. Minor paranasal sinus mucosal thickening at the imaged levels. Electronically Signed   By: Rockey Childs D.O.   On: 09/12/2023 17:12   VAS US  ABI WITH/WO TBI Result Date: 09/12/2023  LOWER EXTREMITY DOPPLER STUDY Patient Name:  Jack [name]  Date of Exam:   09/12/2023 Medical Rec #: [id number]      Accession #:    [id number] Date of Birth: 1946-03-26      Patient Gender: M Patient Age:   31 years Exam Location:  [facility] Procedure:      VAS US  ABI WITH/WO TBI Referring Phys: LONNI GASKINS --------------------------------------------------------------------------------  Indications: Rest pain, and ulceration. High Risk Factors: Hypertension, hyperlipidemia, current smoker, coronary artery                    disease. Other Factors: Non compliance with medication and medical care since death of                his wife in 2017.  Limitations: Today's exam was limited due to Multiple  lines in left upper              extremity, skin texture, involuntary movement, and arrythmia Comparison Study: Prior study done at [facility] 09/07/23 Performing Technologist: Rachel Pellet RVS  Examination Guidelines: A complete evaluation includes at minimum, Doppler waveform signals and systolic blood pressure reading at the level of bilateral brachial, anterior tibial, and posterior tibial arteries, when  vessel segments are accessible. Bilateral testing is considered an integral part of a complete examination. Photoelectric Plethysmograph (PPG) waveforms and toe systolic pressure readings are included as required and additional duplex testing as needed. Limited examinations for reoccurring indications may be performed as noted.  ABI Findings: +---------+------------------+-----+-------------------+--------+ Right    Rt Pressure (mmHg)IndexWaveform           Comment  +---------+------------------+-----+-------------------+--------+ Brachial 147                    multiphasic                 +---------+------------------+-----+-------------------+--------+ PTA      51                0.35 dampened monophasic         +---------+------------------+-----+-------------------+--------+ DP       54                0.37 dampened monophasic         +---------+------------------+-----+-------------------+--------+ Great Toe0                 0.00 Absent                      +---------+------------------+-----+-------------------+--------+ +---------+------------------+-----+-------------------+---------------------+ Left     Lt Pressure (mmHg)IndexWaveform           Comment               +---------+------------------+-----+-------------------+---------------------+ Brachial                        multiphasic        multiple lines in arm +---------+------------------+-----+-------------------+---------------------+ PTA      47                0.32 dampened monophasic                       +---------+------------------+-----+-------------------+---------------------+ DP       33                0.22 dampened monophasic                      +---------+------------------+-----+-------------------+---------------------+ Great Toe0                 0.00 Absent                                   +---------+------------------+-----+-------------------+---------------------+ +-------+-----------+-----------+------------+------------+ ABI/TBIToday's ABIToday's TBIPrevious ABIPrevious TBI +-------+-----------+-----------+------------+------------+ Right  0.37       absent     0.32                     +-------+-----------+-----------+------------+------------+ Left   0.32       absent     0.29                     +-------+-----------+-----------+------------+------------+ Bilateral ABIs appear essentially unchanged compared to prior study on 09/07/2023.  Summary: Right: Resting right ankle-brachial index indicates severe right lower extremity arterial disease. Left: Resting left ankle-brachial index indicates severe left lower extremity arterial disease. *See table(s) above for measurements and observations.  Electronically signed by Lonni Gaskins MD on 09/12/2023 at 3:22:39 PM.    Final     DISCHARGE EXAMINATION: Vitals:   09/28/23 9394 09/28/23 9170 09/29/23 0502 09/29/23 0746  BP: ROLLEN)  156/85 (!) 141/83 127/80 135/78  Pulse: 86 85 81 80  Resp: 18 18 18 18   Temp: 98.2 F (36.8 C) 98.4 F (36.9 C) 98.2 F (36.8 C) 98.3 F (36.8 C)  TempSrc: Oral Oral Oral Oral  SpO2: 95% 96% 95% 95%  Weight:      Height:       Awake alert.  In no distress.   DISPOSITION: SNF  Discharge Instructions     Call MD for:  difficulty breathing, headache or visual disturbances   Complete by: As directed    Call MD for:  extreme fatigue   Complete by: As directed    Call MD for:  persistant dizziness or light-headedness   Complete by: As directed    Call MD for:   persistant nausea and vomiting   Complete by: As directed    Call MD for:  redness, tenderness, or signs of infection (pain, swelling, redness, odor or green/yellow discharge around incision site)   Complete by: As directed    Call MD for:  severe uncontrolled pain   Complete by: As directed    Call MD for:  temperature >100.4   Complete by: As directed    Diet general   Complete by: As directed    Discharge instructions   Complete by: As directed    Please review instructions on the discharge summary.  You were cared for by a hospitalist during your hospital stay. If you have any questions about your discharge medications or the care you received while you were in the hospital after you are discharged, you can call the unit and asked to speak with the hospitalist on call if the hospitalist that took care of you is not available. Once you are discharged, your primary care physician will handle any further medical issues. Please note that NO REFILLS for any discharge medications will be authorized once you are discharged, as it is imperative that you return to your primary care physician (or establish a relationship with a primary care physician if you do not have one) for your aftercare needs so that they can reassess your need for medications and monitor your lab values. If you do not have a primary care physician, you can call [phone] for a physician referral.   Discharge wound care:   Complete by: As directed    Wound care  Daily      Comments: Paint LE wounds/heels with betadine, allow to air dry, top with foam Cover open wound right LE with single layer of xeroform, top with foam   Increase activity slowly   Complete by: As directed          Allergies as of 09/29/2023   No Known Allergies      Medication List     TAKE these medications    acetaminophen  325 MG tablet Commonly known as: TYLENOL  Take 2 tablets (650 mg total) by mouth every 6 (six) hours as needed for mild  pain (pain score 1-3) (or Fever >/= 101).   ascorbic acid  250 MG tablet Commonly known as: VITAMIN C  Take 1 tablet (250 mg total) by mouth 2 (two) times daily.   aspirin  EC 81 MG tablet Take 1 tablet (81 mg total) by mouth daily. Swallow whole.   escitalopram  5 MG tablet Commonly known as: LEXAPRO  Take 1 tablet (5 mg total) by mouth daily.   feeding supplement Liqd Take 237 mLs by mouth 2 (two) times daily between meals.   oxyCODONE -acetaminophen  5-325 MG tablet  Commonly known as: PERCOCET/ROXICET Take 1 tablet by mouth every 4 (four) hours as needed for moderate pain (pain score 4-6).   rosuvastatin  20 MG tablet Commonly known as: CRESTOR  Take 1 tablet (20 mg total) by mouth daily.   senna-docusate 8.6-50 MG tablet Commonly known as: Senokot-S Take 2 tablets by mouth at bedtime.   zinc  sulfate (50mg  elemental zinc ) 220 (50 Zn) MG capsule Take 1 capsule (220 mg total) by mouth daily.               Discharge Care Instructions  (From admission, onward)           Start     Ordered   09/24/23 0000  Discharge wound care:       Comments: Wound care  Daily      Comments: Paint LE wounds/heels with betadine, allow to air dry, top with foam Cover open wound right LE with single layer of xeroform, top with foam   09/24/23 1041              Follow-up Information     Gretta Lonni PARAS, MD Follow up.   Specialty: Vascular Surgery Why: If symptoms worsen Contact information: [address] [place] [address] [phone]                 TOTAL DISCHARGE TIME: 35 minutes  Tex Conroy Verdene  [facility] Pager on [email or web address]  09/29/2023, 9:22 AM

## 2023-09-29 NOTE — TOC Progression Note (Addendum)
 Transition of Care [facility]) - Progression Note    Patient Details  Name: Jack [name] MRN: [id number] Date of Birth: 10-21-1945  Transition of Care [facility]) CM/SW Contact  Bridget Cordella Simmonds, LCSW Phone Number: 09/29/2023, 11:31 AM  Clinical Narrative:  SNF auth request remains pending in [place].   1440: SNF auth request still pending.   1550: CSW called navi, was informed that request had been referred for P2P with deadline of 1630.  [employer] rep did not show any record that [employer] had called to inform anyone of P2P.   P2P can be extended to 5pm today.  [phone] #5.  MD notified and confirmed he can call.   Expected Discharge Plan: Skilled Nursing Facility Barriers to Discharge: Continued Medical Work up  Expected Discharge Plan and Services                                               Social Determinants of Health (SDOH) Interventions SDOH Screenings   Food Insecurity: No Food Insecurity (09/11/2023)  Housing: Low Risk  (09/11/2023)  Transportation Needs: No Transportation Needs (09/11/2023)  Utilities: Not At Risk (09/11/2023)  Alcohol Screen: Low Risk  (02/02/2018)  Depression (PHQ2-9): Low Risk  (04/10/2020)  Financial Resource Strain: Low Risk  (09/04/2023)   Received from [facility]  Social Connections: Socially Isolated (09/11/2023)  Tobacco Use: High Risk (09/22/2023)    Readmission Risk Interventions     No data to display

## 2023-09-29 NOTE — Progress Notes (Signed)
1730- Performed wound care on bilateral lower extremities at this time.

## 2023-09-30 DIAGNOSIS — L97519 Non-pressure chronic ulcer of other part of right foot with unspecified severity: Secondary | ICD-10-CM | POA: Diagnosis not present

## 2023-09-30 DIAGNOSIS — I70235 Atherosclerosis of native arteries of right leg with ulceration of other part of foot: Secondary | ICD-10-CM | POA: Diagnosis not present

## 2023-09-30 NOTE — Progress Notes (Signed)
 PROGRESS NOTE   Jack [name]  FMW:[id number]    DOB: November 07, 1945    DOA: 09/11/2023  PCP: Orpha Yancey LABOR, MD   I have briefly reviewed patients previous medical records in [facility].    Brief Hospital Course:   78 year old with a history of HTN, CAD status post CABG, and HLD who admits to not taking his usual medications or caring for himself appropriately since the death of his wife in Aug 12, 2016. He was admitted to [facility] 09/03/2023 for evaluation of what was felt to be bilateral lower extremity cellulitis with ulcerations. He underwent I&D at that facility and was treated with empiric antibiotics. Wound cultures grew MRSA sensitive to doxycycline. ABIs of the right leg were noted to be significantly diminished and it was felt the patient would likely require vascular intervention for wound healing. Attempts were made to transfer him starting 1/13 but due to limited bed availability he did not arrive at [facility] until late night 09/11/2023. On his arrival at [facility] the patient has noted to have extensive wounds to both lower extremities but with no evidence of acute infection at this time. Most prominent are deep bilateral heel ulcers with a thick overlying eschar. Vascular surgery and palliative care was consulted.  Lower extremity wounds being managed conservatively with no plans for surgical intervention.  Patient has been medically stable for DC for several days and awaiting safe disposition/STR SNF placement which was denied by his insurance.   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) Active Problems:   Ulcers of both lower legs (HCC)   Hyperlipidemia LDL goal <70   Essential hypertension, benign   CKD stage 3a, GFR 45-59 ml/min (HCC)   Self neglect   Malnutrition of moderate degree   Bilateral lower extremity ulcerations and deep tissue injury to bilateral heels PAD ABIs obtained at [facility] 1/18 are consistent with  severe right and left lower extremity arterial disease at 0.37 and 0.32 respectively. Status post angiogram 1/24.   Patient was unable to tolerate MRI of the right foot/heel Patient and family were offered either palliative wound care of bilateral lower extremities versus bilateral AKA.  Family has had multiple conversations with vascular surgery and palliative care. Plan is to continue with palliative wound care rather than amputation.  Plan is for skilled nursing facility for short-term rehab and then to go home with his family.   He has completed course of antibiotics. Pain medications have been prescribed as needed. Although patient has been medically optimized for DC, SNF was declined by patient's insurance.  Per TOC, son wish to appeal the insurance denial. As per communication with TOC and palliative care medicine, palliative care team has discussed extensively with family in the past.  If SNF was declined, going home with hospice was discussed.  One of the sons reportedly was not in agreement because he was unable to care for patient but the other son said that he would care for him at home but needed him to be getting around more prior to taking him home.    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  Nutritional Status Nutrition Problem: Moderate Malnutrition Etiology: chronic illness, social / environmental circumstances Signs/Symptoms: moderate muscle  depletion, mild fat depletion, moderate fat depletion Interventions: Ensure Enlive (each supplement provides 350kcal and 20 grams of protein), Magic cup, MVI Agree with above dietitian's evaluation and recommendations.  Pressure Ulcer: Pressure Injury 09/11/23 Sacrum Anterior;Mid Stage 1 -  Intact skin with non-blanchable redness of a localized area usually over a bony prominence. (Active)  09/11/23 2350  Location: Sacrum  Location Orientation: Anterior;Mid  Staging: Stage 1 -  Intact skin with non-blanchable redness of a localized area usually over a bony prominence.  Wound Description (Comments):   Present on Admission: Yes  Dressing Type Foam - Lift dressing to assess site every shift 09/29/23 1957     Pressure Injury 09/18/23 Buttocks Left Stage 2 -  Partial thickness loss of dermis presenting as a shallow open injury with a red, pink wound bed without slough. (Active)  09/18/23 2200  Location: Buttocks  Location Orientation: Left  Staging: Stage 2 -  Partial thickness loss of dermis presenting as a shallow open injury with a red, pink wound bed without slough.  Wound Description (Comments):   Present on Admission:   Dressing Type Foam - Lift dressing to assess site every shift 09/29/23 1957     Pressure Injury 09/19/23 Heel Right Unstageable - Full thickness tissue loss in which the base of the injury is covered by slough (yellow, tan, gray, green or brown) and/or eschar (tan, brown or black) in the wound bed. (Active)  09/19/23 1736  Location: Heel  Location Orientation: Right  Staging: Unstageable - Full thickness tissue loss in which the base of the injury is covered by slough (yellow, tan, gray, green or brown) and/or eschar (tan, brown or black) in the wound bed.  Wound Description (Comments):   Present on Admission: Yes  Dressing Type Foam - Lift dressing to assess site every shift 09/29/23 1300  Agree with above assessment regarding multiple wounds.  DVT prophylaxis:  enoxaparin  (LOVENOX ) injection 40 mg Start: 09/12/23 1600     Code Status: Limited: Do not attempt resuscitation (DNR) -DNR-LIMITED -Do Not Intubate/DNI :  Family Communication: None at bedside. Disposition:  Status is: Inpatient Remains inpatient appropriate because: Unsafe disposition.  TOC on board.     Consultants:   Palliative care medicine Vascular surgery  Procedures:     Antimicrobials:      Subjective:  Patient denies complaints.  Specifically no pain reported.  Informed him that his insurance had denied STR SNF and that one of his sons planned to appeal this denial.  He verbalized understanding.  Objective:   Vitals:   09/29/23 1501 09/29/23 2035 09/30/23 0536 09/30/23 0725  BP: 131/70 123/69 130/79 126/86  Pulse: 86 81 82 85  Resp: 18 16 17 18   Temp: 98.2 F (36.8 C) 98.3 F (36.8 C) 98.6 F (37 C) 97.6 F (36.4 C)  TempSrc: Oral Oral  Oral  SpO2: 96% 95% 96% 92%  Weight:      Height:        General exam: Elderly male, moderately built and frail, chronically ill looking lying comfortably propped up in bed without distress. Respiratory system: Clear to auscultation. Respiratory effort normal. Cardiovascular system: S1 & S2 heard, RRR. No JVD, murmurs, rubs, gallops or clicks. No pedal  edema.  Not on telemetry. Gastrointestinal system: Abdomen is nondistended, soft and nontender. No organomegaly or masses felt. Normal bowel sounds heard. Central nervous system: Alert and oriented. No focal neurological deficits. Extremities: Symmetric 5 x 5 power.  Bilateral lower legs with Ace wrapping.  Dressing clean, dry and intact. Skin: No rashes, lesions or ulcers Psychiatry: Judgement and insight impaired. Mood & affect flat.     Data Reviewed:   I have personally reviewed following labs and imaging studies   CBC: Recent Labs  Lab 09/26/23 0512  WBC 10.5  HGB 12.6*  HCT 38.2*  MCV 97.0  PLT 259    Basic Metabolic Panel: Recent Labs  Lab  09/26/23 0512  NA 136  K 4.2  CL 99  CO2 24  GLUCOSE 101*  BUN 17  CREATININE 0.90  CALCIUM  8.9  MG 2.0    Liver Function Tests: No results for input(s): AST, ALT, ALKPHOS, BILITOT, PROT, ALBUMIN in the last 168 hours.  CBG: No results for input(s): GLUCAP in the last 168 hours.  Microbiology Studies:  No results found for this or any previous visit (from the past 240 hours).  Radiology Studies:  No results found.  Scheduled Meds:    ascorbic acid   250 mg Oral BID   aspirin  EC  81 mg Oral Daily   enoxaparin  (LOVENOX ) injection  40 mg Subcutaneous Q24H   escitalopram   5 mg Oral Daily   feeding supplement  237 mL Oral BID BM   rosuvastatin   20 mg Oral Daily   sodium chloride  flush  3 mL Intravenous Q12H    Continuous Infusions:     LOS: 19 days     Trenda Mar, MD,  FACP, [facility], [facility], [facility]   [facility] [place]      To contact the attending provider between 7A-7P or the covering provider during after hours 7P-7A, please log into the web site [email or web address] and access using universal [place] password for that web site. If you do not have the password, please call the hospital operator.  09/30/2023, 3:51 PM

## 2023-09-30 NOTE — TOC Progression Note (Signed)
 Transition of Care [facility]) - Progression Note    Patient Details  Name: Jack [name] MRN: [id number] Date of Birth: [birth year]  Transition of Care [facility]) CM/SW Contact  Bridget Cordella Simmonds, LCSW Phone Number: 09/30/2023, 11:01 AM  Clinical Narrative:   Denial of SNF auth request noted in [place].  CSW spoke with pt and then son Franky and informed them.  Franky reports the long term plan would be for pt do rehab so that he can be as mobile as possible and then Franky and his wife plan to live with pt to assist.  Discussed appeal option and Franky does want to appeal.  Letter of denial emailed to [place] and hard copy also placed in room. Franky will be at the hospital later today but will call to start the appeal.     Expected Discharge Plan: Skilled Nursing Facility Barriers to Discharge: Continued Medical Work up  Expected Discharge Plan and Services                                               Social Determinants of Health (SDOH) Interventions SDOH Screenings   Food Insecurity: No Food Insecurity (09/11/2023)  Housing: Low Risk  (09/11/2023)  Transportation Needs: No Transportation Needs (09/11/2023)  Utilities: Not At Risk (09/11/2023)  Alcohol Screen: Low Risk  (02/02/2018)  Depression (PHQ2-9): Low Risk  (04/10/2020)  Financial Resource Strain: Low Risk  (09/04/2023)   Received from [facility]  Social Connections: Socially Isolated (09/11/2023)  Tobacco Use: High Risk (09/22/2023)    Readmission Risk Interventions     No data to display

## 2023-10-01 DIAGNOSIS — I70235 Atherosclerosis of native arteries of right leg with ulceration of other part of foot: Secondary | ICD-10-CM | POA: Diagnosis not present

## 2023-10-01 DIAGNOSIS — L97519 Non-pressure chronic ulcer of other part of right foot with unspecified severity: Secondary | ICD-10-CM | POA: Diagnosis not present

## 2023-10-01 NOTE — Progress Notes (Signed)
 Physical Therapy Treatment Patient Details Name: Jack [name] MRN: [id number] DOB: 05-19-1946 Today's Date: 10/01/2023   History of Present Illness 78 y.o. male presents to [facility] from [facility] 09/11/23 for vascular surgery consult/intervention for B LE. Pt admitted w/ deep B heel ulcers w/ no evidence of acute infection. Lower extremity wounds being managed conservatively with no plans for surgical intervention. PMHx: HTN, CA s/p CABG, HLD    PT Comments  Pt in bed upon arrival and agreeable to PT session. Pt made significant improvements in mobility with needing less assistance in today's session. Pt was able to move from supine to sit with CGA for safety. Pt was able to perform a slideboard transfer to the recliner with MinA to set-up transfer. Pt did not require any physical assist to transfer with cues for technique and safety. Updated pt goals to reflect current progress. Pt would continue to benefit from <3hrs post acute rehab to work towards independence with mobility. Acute PT to follow.      If plan is discharge home, recommend the following: A lot of help with bathing/dressing/bathroom;Assistance with cooking/housework;Assist for transportation;Help with stairs or ramp for entrance;Two people to help with walking and/or transfers;Supervision due to cognitive status;Direct supervision/assist for financial management;Direct supervision/assist for medications management   Can travel by private vehicle     No  Equipment Recommendations  Hospital bed;Hoyer lift (slideboard)       Precautions / Restrictions Precautions Precautions: Fall Precaution Comments: Contact precs; B LE wounds, eschar on B heels Required Braces or Orthoses: Other Brace Other Brace: prevalon boots; float heels Restrictions Weight Bearing Restrictions Per Provider Order: No Other Position/Activity Restrictions: still full WB allowed per Dr Rojelio 1/23     Mobility  Bed Mobility Overal bed mobility: Needs  Assistance Bed Mobility: Supine to Sit    Supine to sit: Used rails, HOB elevated, Contact guard    General bed mobility comments: CGA for safety, cues for technique. Pt able to scoot towards EOB with CGA    Transfers Overall transfer level: Needs assistance Equipment used: Sliding board Transfers: Bed to chair/wheelchair/BSC       Lateral/Scoot Transfers: Min assist, With slide board General transfer comment: MinA to place slideboard underneath pt. CGA for slideboard transfer with cues for technique and hand placement. No physical assist needed. Cues for pt to lean forwards halfway through transfer as pt was started to lean posteriorly.    Ambulation/Gait  General Gait Details: deferred      Balance Overall balance assessment: Needs assistance Sitting-balance support: Feet supported, No upper extremity supported Sitting balance-Leahy Scale: Good     Cognition Arousal: Alert Behavior During Therapy: WFL for tasks assessed/performed Overall Cognitive Status: No family/caregiver present to determine baseline cognitive functioning    Following Commands: Follows multi-step commands with increased time    Problem Solving: Slow processing, Requires verbal cues           General Comments General comments (skin integrity, edema, etc.): VSS on RA, slight lightheadedness upon sitting EOB with BP WFL. Symptoms relieved with rest.      Pertinent Vitals/Pain Pain Assessment Pain Assessment: Faces Faces Pain Scale: Hurts a little bit Pain Location: B LE's with movement Pain Descriptors / Indicators: Grimacing, Guarding Pain Intervention(s): Limited activity within patient's tolerance, Monitored during session, Repositioned     PT Goals (current goals can now be found in the care plan section) Acute Rehab PT Goals Patient Stated Goal: to go back home PT Goal Formulation: With patient Time  For Goal Achievement: 10/15/23 Potential to Achieve Goals: Fair Progress towards PT  goals: Goals updated    Frequency    Min 1X/week       AM-PAC PT 6 Clicks Mobility   Outcome Measure  Help needed turning from your back to your side while in a flat bed without using bedrails?: A Little Help needed moving from lying on your back to sitting on the side of a flat bed without using bedrails?: A Little Help needed moving to and from a bed to a chair (including a wheelchair)?: A Little Help needed standing up from a chair using your arms (e.g., wheelchair or bedside chair)?: Total Help needed to walk in hospital room?: Total Help needed climbing 3-5 steps with a railing? : Total 6 Click Score: 12    End of Session Equipment Utilized During Treatment: Gait belt (prevalon boots, hoyer lift pad under pt) Activity Tolerance: Patient tolerated treatment well Patient left: with call bell/phone within reach;in chair;with chair alarm set Nurse Communication: Mobility status;Need for lift equipment PT Visit Diagnosis: Other abnormalities of gait and mobility (R26.89);Muscle weakness (generalized) (M62.81);Difficulty in walking, not elsewhere classified (R26.2);Adult, failure to thrive (R62.7)     Time: 1137-1207 PT Time Calculation (min) (ACUTE ONLY): 30 min  Charges:    $Therapeutic Exercise: 8-22 mins $Therapeutic Activity: 8-22 mins PT General Charges $$ ACUTE PT VISIT: 1 Visit                     Jack ORN, PT, DPT Secure Chat Preferred  Rehab Office [phone]   Jack [name] 10/01/2023, 2:08 PM

## 2023-10-01 NOTE — TOC Progression Note (Signed)
 Transition of Care [facility]) - Progression Note    Patient Details  Name: Jack [name] MRN: [id number] Date of Birth: 01-15-1946  Transition of Care [facility]) CM/SW Contact  Bridget Cordella Simmonds, LCSW Phone Number: 10/01/2023, 12:35 PM  Clinical Narrative:    CSW received call from pt son. He and pt did call to start appeal yesterday, were told that the denial was due to no paperwork being submitted.  1220: CSW called appeal [phone].  They confirmed that the appeal was started yesterday, [id number].  Time frame is 24-72 hours.    Expected Discharge Plan: Skilled Nursing Facility Barriers to Discharge: Continued Medical Work up  Expected Discharge Plan and Services                                               Social Determinants of Health (SDOH) Interventions SDOH Screenings   Food Insecurity: No Food Insecurity (09/11/2023)  Housing: Low Risk  (09/11/2023)  Transportation Needs: No Transportation Needs (09/11/2023)  Utilities: Not At Risk (09/11/2023)  Alcohol Screen: Low Risk  (02/02/2018)  Depression (PHQ2-9): Low Risk  (04/10/2020)  Financial Resource Strain: Low Risk  (09/04/2023)   Received from [facility]  Social Connections: Socially Isolated (09/11/2023)  Tobacco Use: High Risk (09/22/2023)    Readmission Risk Interventions     No data to display

## 2023-10-01 NOTE — Progress Notes (Signed)
 PROGRESS NOTE   Jack [name]  FMW:[id number]    DOB: June 25, 1946    DOA: 09/11/2023  PCP: Orpha Yancey LABOR, MD   I have briefly reviewed patients previous medical records in [facility].    Brief Hospital Course:   78 year old with a history of HTN, CAD status post CABG, and HLD who admits to not taking his usual medications or caring for himself appropriately since the death of his wife in Aug 09, 2016. He was admitted to [facility] 09/03/2023 for evaluation of what was felt to be bilateral lower extremity cellulitis with ulcerations. He underwent I&D at that facility and was treated with empiric antibiotics. Wound cultures grew MRSA sensitive to doxycycline. ABIs of the right leg were noted to be significantly diminished and it was felt the patient would likely require vascular intervention for wound healing. Attempts were made to transfer him starting 1/13 but due to limited bed availability he did not arrive at [facility] until late night 09/11/2023. On his arrival at [facility] the patient has noted to have extensive wounds to both lower extremities but with no evidence of acute infection at this time. Most prominent are deep bilateral heel ulcers with a thick overlying eschar. Vascular surgery and palliative care was consulted.  Lower extremity wounds being managed conservatively with no plans for surgical intervention.  Patient has been medically stable for DC for several days and awaiting safe disposition/STR SNF placement which was denied by his insurance.  Family have initiated denial appeal, status pending.   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 Status post angiogram 1/24.   Patient was unable to tolerate MRI of the right foot/heel Patient and family were offered either palliative wound care of bilateral lower extremities versus bilateral AKA.  Family has had multiple conversations with vascular surgery and palliative care. Plan is to continue with palliative wound care rather than amputation.  Plan is for skilled nursing facility for short-term rehab and then to go home with his family.   He has completed course of antibiotics. Pain medications have been prescribed as needed. Although patient has been medically optimized for DC, SNF was declined by patient's insurance.  Family has initiated denial appeal, status pending. As per communication with TOC and palliative care medicine, palliative care team has discussed extensively with family in the past.  If SNF was declined, going home with hospice was discussed.  One of the sons reportedly was not in agreement because he was unable to care for patient but the other son said that he would care for him at home but needed him to be getting around more prior to taking him home.    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  Nutritional Status Nutrition Problem: Moderate Malnutrition Etiology: chronic illness, social /  environmental circumstances Signs/Symptoms: moderate muscle depletion, mild fat depletion, moderate fat depletion Interventions: Ensure Enlive (each supplement provides 350kcal and 20 grams of protein), Magic cup, MVI Agree with above dietitian's evaluation and recommendations.  Pressure Ulcer: Pressure Injury 09/11/23 Sacrum Anterior;Mid Stage 1 -  Intact skin with non-blanchable redness of a localized area usually over a bony prominence. (Active)  09/11/23 2350  Location: Sacrum  Location Orientation: Anterior;Mid  Staging: Stage 1 -  Intact skin with non-blanchable redness of a localized area usually over a bony prominence.  Wound Description (Comments):   Present on Admission: Yes  Dressing Type Foam - Lift dressing to assess site every shift 09/30/23 2001     Pressure Injury 09/18/23 Buttocks Left Stage 2 -  Partial thickness loss of dermis presenting as a shallow open injury with a red, pink wound bed without slough. (Active)  09/18/23 2200  Location: Buttocks  Location Orientation: Left  Staging: Stage 2 -  Partial thickness loss of dermis presenting as a shallow open injury with a red, pink wound bed without slough.  Wound Description (Comments):   Present on Admission:   Dressing Type Foam - Lift dressing to assess site every shift 09/30/23 2001     Pressure Injury 09/19/23 Heel Right Unstageable - Full thickness tissue loss in which the base of the injury is covered by slough (yellow, tan, gray, green or brown) and/or eschar (tan, brown or black) in the wound bed. (Active)  09/19/23 1736  Location: Heel  Location Orientation: Right  Staging: Unstageable - Full thickness tissue loss in which the base of the injury is covered by slough (yellow, tan, gray, green or brown) and/or eschar (tan, brown or black) in the wound bed.  Wound Description (Comments):   Present on Admission: Yes  Dressing Type Foam - Lift dressing to assess site every shift 09/29/23 1300  Agree with above  assessment regarding multiple wounds.  DVT prophylaxis: enoxaparin  (LOVENOX ) injection 40 mg Start: 09/12/23 1600     Code Status: Limited: Do not attempt resuscitation (DNR) -DNR-LIMITED -Do Not Intubate/DNI :  Family Communication: None at bedside. Disposition:  Status is: Inpatient Remains inpatient appropriate because: Unsafe disposition.  TOC on board.     Consultants:   Palliative care medicine Vascular surgery  Procedures:     Antimicrobials:      Subjective:  Denies complaints.  Objective:   Vitals:   09/30/23 0725 09/30/23 2129 10/01/23 0427 10/01/23 0821  BP: 126/86 137/67 135/76 (!) 158/67  Pulse: 85 77 76 74  Resp: 18 18  17   Temp: 97.6 F (36.4 C) 98.3 F (36.8 C) 98.4 F (36.9 C) 97.8 F (36.6 C)  TempSrc: Oral Oral Oral   SpO2: 92% 95% 96% 93%  Weight:      Height:       No new findings compared to yesterday's exam.  Stable.  General exam: Elderly male, moderately built and frail, chronically ill looking lying comfortably propped up in bed without distress. Respiratory system: Clear to auscultation. Respiratory effort normal. Cardiovascular system: S1 & S2 heard, RRR. No JVD, murmurs, rubs, gallops or clicks. No pedal edema.  Not on telemetry. Gastrointestinal system: Abdomen is nondistended, soft and nontender. No organomegaly  or masses felt. Normal bowel sounds heard. Central nervous system: Alert and oriented. No focal neurological deficits. Extremities: Symmetric 5 x 5 power.  Bilateral lower legs with Ace wrapping.  Dressing clean, dry and intact. Skin: No rashes, lesions or ulcers Psychiatry: Judgement and insight impaired. Mood & affect flat.     Data Reviewed:   I have personally reviewed following labs and imaging studies   CBC: Recent Labs  Lab 09/26/23 0512  WBC 10.5  HGB 12.6*  HCT 38.2*  MCV 97.0  PLT 259    Basic Metabolic Panel: Recent Labs  Lab 09/26/23 0512  NA 136  K 4.2  CL 99  CO2 24  GLUCOSE 101*  BUN  17  CREATININE 0.90  CALCIUM  8.9  MG 2.0    Liver Function Tests: No results for input(s): AST, ALT, ALKPHOS, BILITOT, PROT, ALBUMIN in the last 168 hours.  CBG: No results for input(s): GLUCAP in the last 168 hours.  Microbiology Studies:  No results found for this or any previous visit (from the past 240 hours).  Radiology Studies:  No results found.  Scheduled Meds:    ascorbic acid   250 mg Oral BID   aspirin  EC  81 mg Oral Daily   enoxaparin  (LOVENOX ) injection  40 mg Subcutaneous Q24H   escitalopram   5 mg Oral Daily   feeding supplement  237 mL Oral BID BM   rosuvastatin   20 mg Oral Daily   sodium chloride  flush  3 mL Intravenous Q12H    Continuous Infusions:     LOS: 20 days     Trenda Mar, MD,  FACP, [facility], [facility], [facility]   [facility] [place]      To contact the attending provider between 7A-7P or the covering provider during after hours 7P-7A, please log into the web site [email or web address] and access using universal [place] password for that web site. If you do not have the password, please call the hospital operator.  10/01/2023, 3:33 PM

## 2023-10-02 DIAGNOSIS — I70235 Atherosclerosis of native arteries of right leg with ulceration of other part of foot: Secondary | ICD-10-CM | POA: Diagnosis not present

## 2023-10-02 DIAGNOSIS — L97519 Non-pressure chronic ulcer of other part of right foot with unspecified severity: Secondary | ICD-10-CM | POA: Diagnosis not present

## 2023-10-02 MED ORDER — ADULT MULTIVITAMIN W/MINERALS CH
1.0000 | ORAL_TABLET | Freq: Every day | ORAL | Status: DC
  Administered 2023-10-02 – 2023-10-07 (×6): 1 via ORAL
  Filled 2023-10-02 (×6): qty 1

## 2023-10-02 NOTE — Progress Notes (Signed)
 PROGRESS NOTE   Jack [name]  FMW:[id number]    DOB: 04/08/1946    DOA: 09/11/2023  PCP: Orpha Yancey LABOR, MD   I have briefly reviewed patients previous medical records in [facility].    Brief Hospital Course:   78 year old with a history of HTN, CAD status post CABG, and HLD who admits to not taking his usual medications or caring for himself appropriately since the death of his wife in 08-25-2016. He was admitted to [facility] 09/03/2023 for evaluation of what was felt to be bilateral lower extremity cellulitis with ulcerations. He underwent I&D at that facility and was treated with empiric antibiotics. Wound cultures grew MRSA sensitive to doxycycline. ABIs of the right leg were noted to be significantly diminished and it was felt the patient would likely require vascular intervention for wound healing. Attempts were made to transfer him starting 1/13 but due to limited bed availability he did not arrive at [facility] until late night 09/11/2023. On his arrival at [facility] the patient has noted to have extensive wounds to both lower extremities but with no evidence of acute infection at this time. Most prominent are deep bilateral heel ulcers with a thick overlying eschar. Vascular surgery and palliative care was consulted.  Lower extremity wounds being managed conservatively with no plans for surgical intervention.  Patient has been medically stable for DC for several days and awaiting safe disposition/STR SNF placement which was denied by his insurance.  Family have initiated denial appeal, status pending.   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 Status post angiogram 1/24.   Patient was unable to tolerate MRI of the right foot/heel Patient and family were offered either palliative wound care of bilateral lower extremities versus bilateral AKA.  Family has had multiple conversations with vascular surgery and palliative care. Plan is to continue with palliative wound care rather than amputation.  Plan is for skilled nursing facility for short-term rehab and then to go home with his family.   He has completed course of antibiotics. Pain medications have been prescribed as needed. Although patient has been medically optimized for DC, SNF was declined by patient's insurance.  Family has initiated denial appeal, status pending. As per communication with TOC and palliative care medicine, palliative care team has discussed extensively with family in the past.  If SNF was declined, going home with hospice was discussed.  One of the sons reportedly was not in agreement because he was unable to care for patient but the other son said that he would care for him at home but needed him to be getting around more prior to taking him home.  Discussed with TOC 2/7.  No new updates.    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  Nutritional Status Nutrition Problem: Moderate  Malnutrition Etiology: chronic illness, social / environmental circumstances Signs/Symptoms: moderate muscle depletion, mild fat depletion, moderate fat depletion Interventions: Ensure Enlive (each supplement provides 350kcal and 20 grams of protein), Magic cup, MVI Agree with above dietitian's evaluation and recommendations.  Pressure Ulcer: Pressure Injury 09/11/23 Sacrum Anterior;Mid Stage 1 -  Intact skin with non-blanchable redness of a localized area usually over a bony prominence. (Active)  09/11/23 2350  Location: Sacrum  Location Orientation: Anterior;Mid  Staging: Stage 1 -  Intact skin with non-blanchable redness of a localized area usually over a bony prominence.  Wound Description (Comments):   Present on Admission: Yes  Dressing Type Foam - Lift dressing to assess site every shift 10/01/23 2009     Pressure Injury 09/18/23 Buttocks Left Stage 2 -  Partial thickness loss of dermis presenting as a shallow open injury with a red, pink wound bed without slough. (Active)  09/18/23 2200  Location: Buttocks  Location Orientation: Left  Staging: Stage 2 -  Partial thickness loss of dermis presenting as a shallow open injury with a red, pink wound bed without slough.  Wound Description (Comments):   Present on Admission:   Dressing Type Foam - Lift dressing to assess site every shift 10/01/23 2009     Pressure Injury 09/19/23 Heel Right Unstageable - Full thickness tissue loss in which the base of the injury is covered by slough (yellow, tan, gray, green or brown) and/or eschar (tan, brown or black) in the wound bed. (Active)  09/19/23 1736  Location: Heel  Location Orientation: Right  Staging: Unstageable - Full thickness tissue loss in which the base of the injury is covered by slough (yellow, tan, gray, green or brown) and/or eschar (tan, brown or black) in the wound bed.  Wound Description (Comments):   Present on Admission: Yes  Dressing Type Foam - Lift dressing to assess  site every shift 10/01/23 2009  Agree with above assessment regarding multiple wounds.  DVT prophylaxis: enoxaparin  (LOVENOX ) injection 40 mg Start: 09/12/23 1600     Code Status: Limited: Do not attempt resuscitation (DNR) -DNR-LIMITED -Do Not Intubate/DNI :  Family Communication: None at bedside. Disposition:  Status is: Inpatient Remains inpatient appropriate because: Unsafe disposition.  TOC on board.     Consultants:   Palliative care medicine Vascular surgery  Procedures:     Antimicrobials:      Subjective:  I am okay.  No complaints reported.  Objective:   Vitals:   10/01/23 1935 10/02/23 0442 10/02/23 0600 10/02/23 0835  BP: 133/82 (!) 146/75  139/87  Pulse: 87 85  83  Resp: 18 16  18   Temp: 98 F (36.7 C) 98.3 F (36.8 C)  98.1 F (36.7 C)  TempSrc: Oral Oral  Oral  SpO2: 96% 96%  91%  Weight:   112 kg   Height:       Over the last 3 days taking care of him, stable and unchanged exam findings.  General exam: Elderly male, moderately built and frail, chronically ill looking lying comfortably propped up in bed without distress. Respiratory system: Clear to auscultation. Respiratory effort normal. Cardiovascular system: S1 & S2 heard, RRR. No JVD, murmurs, rubs, gallops or clicks. No  pedal edema.  Not on telemetry. Gastrointestinal system: Abdomen is nondistended, soft and nontender. No organomegaly or masses felt. Normal bowel sounds heard. Central nervous system: Alert and oriented. No focal neurological deficits. Extremities: Symmetric 5 x 5 power.  Bilateral lower legs with Ace wrapping.  Dressing clean, dry and intact. Skin: No rashes, lesions or ulcers Psychiatry: Judgement and insight impaired. Mood & affect flat.     Data Reviewed:   I have personally reviewed following labs and imaging studies   CBC: Recent Labs  Lab 09/26/23 0512  WBC 10.5  HGB 12.6*  HCT 38.2*  MCV 97.0  PLT 259    Basic Metabolic Panel: Recent Labs  Lab  09/26/23 0512  NA 136  K 4.2  CL 99  CO2 24  GLUCOSE 101*  BUN 17  CREATININE 0.90  CALCIUM  8.9  MG 2.0    Liver Function Tests: No results for input(s): AST, ALT, ALKPHOS, BILITOT, PROT, ALBUMIN in the last 168 hours.  CBG: No results for input(s): GLUCAP in the last 168 hours.  Microbiology Studies:  No results found for this or any previous visit (from the past 240 hours).  Radiology Studies:  No results found.  Scheduled Meds:    ascorbic acid   250 mg Oral BID   aspirin  EC  81 mg Oral Daily   enoxaparin  (LOVENOX ) injection  40 mg Subcutaneous Q24H   escitalopram   5 mg Oral Daily   feeding supplement  237 mL Oral BID BM   multivitamin with minerals  1 tablet Oral Daily   rosuvastatin   20 mg Oral Daily   sodium chloride  flush  3 mL Intravenous Q12H    Continuous Infusions:     LOS: 21 days     Trenda Mar, MD,  FACP, [facility], [facility], [facility]   [facility] [place]      To contact the attending provider between 7A-7P or the covering provider during after hours 7P-7A, please log into the web site [email or web address] and access using universal [place] password for that web site. If you do not have the password, please call the hospital operator.  10/02/2023, 3:13 PM

## 2023-10-02 NOTE — Progress Notes (Signed)
 OT Cancellation Note  Patient Details Name: MORDCHE HEDGLIN MRN: [id number] DOB: Apr 26, 1946   Cancelled Treatment:    Reason Eval/Treat Not Completed: Patient declined, no reason specified (Patient stating he was not feeling well and asked OT to try again later today. OT to attempt later today as schedule permits) Dick Laine, OTA Acute Rehabilitation Services  Office [phone]  Jeb LITTIE Laine 10/02/2023, 9:50 AM

## 2023-10-02 NOTE — Progress Notes (Signed)
 Occupational Therapy Treatment Patient Details Name: Jack [name] MRN: [id number] DOB: 12/05/1945 Today's Date: 10/02/2023   History of present illness 78 y.o. male presents to [facility] from [facility] 09/11/23 for vascular surgery consult/intervention for B LE. Pt admitted w/ deep B heel ulcers w/ no evidence of acute infection. Lower extremity wounds being managed conservatively with no plans for surgical intervention. PMHx: HTN, CA s/p CABG, HLD   OT comments  Patient received in supine and declining OOB but agreeable to sitting on EOB for self care tasks. Patient able to perform face hygiene and UB bathing while seated on EOB and max assist for LB. Gown change performed with min assist. Patient asking to return to supine following due to complaints of BLE pain. Patient will benefit from continued inpatient follow up therapy, <3 hours/day. Acute OT to continue to follow to address established goals to facilitate DC to next venue of care.       If plan is discharge home, recommend the following:  Two people to help with walking and/or transfers;Two people to help with bathing/dressing/bathroom;Assistance with cooking/housework;Assist for transportation;Help with stairs or ramp for entrance;Direct supervision/assist for financial management;Direct supervision/assist for medications management   Equipment Recommendations  Other (comment) (defer to next venue of care)    Recommendations for Other Services      Precautions / Restrictions Precautions Precautions: Fall Precaution Comments: Contact precs; B LE wounds, eschar on B heels Required Braces or Orthoses: Other Brace Other Brace: prevalon boots; float heels Restrictions Weight Bearing Restrictions Per Provider Order: No Other Position/Activity Restrictions: still full WB allowed per Dr Rojelio 1/23       Mobility Bed Mobility Overal bed mobility: Needs Assistance Bed Mobility: Supine to Sit, Sit to Supine     Supine to sit: Used rails, HOB  elevated, Contact guard Sit to supine: Min assist   General bed mobility comments: CGA for safety to get to EOB and assistance with BLEs to return to supine    Transfers Overall transfer level: Needs assistance                 General transfer comment: declined OOB     Balance Overall balance assessment: Needs assistance Sitting-balance support: Feet supported, No upper extremity supported Sitting balance-Leahy Scale: Good Sitting balance - Comments: supervision to sit on EOB while peforming self care       Standing balance comment: unable                           ADL either performed or assessed with clinical judgement   ADL Overall ADL's : Needs assistance/impaired     Grooming: Wash/dry face;Set up;Sitting Grooming Details (indicate cue type and reason): EOB Upper Body Bathing: Contact guard assist;Sitting Upper Body Bathing Details (indicate cue type and reason): EOB Lower Body Bathing: Maximal assistance;Sitting/lateral leans Lower Body Bathing Details (indicate cue type and reason): EOB Upper Body Dressing : Minimal assistance;Sitting Upper Body Dressing Details (indicate cue type and reason): changed gown                   General ADL Comments: declining OOB and performed self care tasks seated on EOB    Extremity/Trunk Assessment              Vision       Perception     Praxis      Cognition Arousal: Alert Behavior During Therapy: WFL for tasks assessed/performed Overall Cognitive Status:  No family/caregiver present to determine baseline cognitive functioning                         Following Commands: Follows multi-step commands with increased time Safety/Judgement: Decreased awareness of safety, Decreased awareness of deficits Awareness: Emergent Problem Solving: Slow processing, Requires verbal cues          Exercises      Shoulder Instructions       General Comments VSS on RA    Pertinent  Vitals/ Pain       Pain Assessment Pain Assessment: Faces Faces Pain Scale: Hurts little more Pain Location: B LE's with movement Pain Descriptors / Indicators: Grimacing, Guarding Pain Intervention(s): Limited activity within patient's tolerance, Monitored during session, Repositioned  Home Living                                          Prior Functioning/Environment              Frequency  Min 1X/week        Progress Toward Goals  OT Goals(current goals can now be found in the care plan section)  Progress towards OT goals: Progressing toward goals  Acute Rehab OT Goals Patient Stated Goal: get better OT Goal Formulation: With patient Time For Goal Achievement: 10/13/23 Potential to Achieve Goals: Fair ADL Goals Pt Will Perform Eating: with supervision;sitting Pt Will Perform Grooming: with supervision;sitting Pt Will Perform Upper Body Bathing: with set-up;sitting Pt Will Perform Lower Body Bathing: with min assist;sitting/lateral leans Pt Will Transfer to Toilet: with mod assist;with +2 assist;stand pivot transfer Additional ADL Goal #1: Pt will perform bed mobility with supervision A as precursor to ADL  Plan      Co-evaluation                 AM-PAC OT 6 Clicks Daily Activity     Outcome Measure   Help from another person eating meals?: A Little Help from another person taking care of personal grooming?: A Little Help from another person toileting, which includes using toliet, bedpan, or urinal?: Total Help from another person bathing (including washing, rinsing, drying)?: A Lot Help from another person to put on and taking off regular upper body clothing?: A Little Help from another person to put on and taking off regular lower body clothing?: Total 6 Click Score: 13    End of Session    OT Visit Diagnosis: Muscle weakness (generalized) (M62.81);Other symptoms and signs involving cognitive function;Other (comment) (decreased  activity tolerance)   Activity Tolerance Patient tolerated treatment well   Patient Left in bed;with call bell/phone within reach;with bed alarm set   Nurse Communication Mobility status        Time: 1323-1340 OT Time Calculation (min): 17 min  Charges: OT General Charges $OT Visit: 1 Visit OT Treatments $Self Care/Home Management : 8-22 mins  Dick Laine, OTA Acute Rehabilitation Services  Office [phone]   Jeb LITTIE Laine 10/02/2023, 2:54 PM

## 2023-10-02 NOTE — Progress Notes (Signed)
 Nutrition Follow-up  DOCUMENTATION CODES:   Non-severe (moderate) malnutrition in context of chronic illness  INTERVENTION:  Continue regular diet as ordered Ensure Enlive po BID, each supplement provides 350 kcal and 20 grams of protein. Magic cup to TID with meals, each supplement provides 290 kcal and 9 grams of protein MVI with minerals daily Request updated measured/daily weights   NUTRITION DIAGNOSIS:   Moderate Malnutrition related to chronic illness, social / environmental circumstances as evidenced by moderate muscle depletion, mild fat depletion, moderate fat depletion.  GOAL:  Patient will meet greater than or equal to 90% of their needs  MONITOR:  PO intake, Supplement acceptance, Labs, Weight trends, Skin  REASON FOR ASSESSMENT:  Consult Wound healing  ASSESSMENT:   78 y/o male admitted with foot ulcer with PAD. Recent admit on 1/9 to [facility] with bilateral leg infections associated with not removing socks for extremely long period of time.  Underwent I & D and treated with antibiotics.  Wound cultures grew MRSA. PMH: HTN, CAD S/P CABG, HLD, bradycardia, kidney stones, HTN, tobacco use, cholecystectomy.  1/17 transfer to [facility] from OSH for vascular assessment of LE wounds 1/18 ABI- consistent with severe bilateral arterial LE disease 1/20 SLP upgraded diet to regular, thin liquids 1/24 s/p angiogram, recommendation for L AKA 1/28 xray R foot, suspicious for osteomyelitis  Lower extremity wounds are being managed conservatively with no plans for surgical intervention. He has been medically stable for discharge for a few days, but awaiting safe disposition to SNF for STR, which was denied by insurance. Appeal submitted, status pending.   Patient stating he continues with inadequate PO intake because he just isn't hungry.  Meal intake remains consistent from last note, averaging 66% x4 more recent meals documented. Had miniature pecan pies as bedside that his son had  purchased for him. Reports he doesn't even have a taste for items he usually would love.  Encouraged PO intake, predominantly protein intake, if he is going to take a limited number of bites at meal times. He verbalized understanding. Could not recall if he was receiving Ensure Enlive. Verified it is being administered via chart review.  Admit Weight: 91.6kg ? accuracy, appears to have been pulled from most recent weight in chart (06/29/2120) First collected weight on admission: 110.7kg Current Weight: 112kg  Patient unable to endorse UBW, but appears stable since admission. Does continue with some generalized edema with mild pitting.   Labs and medications are unremarkable. He has completed 14 days of vitamin C  and zinc  supplementation regimen to aid in wound healing. Start MVI.   Medications: ascorbic acid    Labs: no new/unremarkable  Diet Order:   Diet Order             Diet general           Diet regular Room service appropriate? Yes; Fluid consistency: Thin  Diet effective now             EDUCATION NEEDS:  Education needs have been addressed  Skin:  Skin Assessment: Skin Integrity Issues: Skin Integrity Issues:: Stage II, Unstageable, Other (Comment) Stage I: sacrum Stage II: L buttock Unstageable: R heel Other: bilateral pretibial venous stasis ulcers  Last BM:  1/26 x 2 type 6 medium/smear  Height:  Ht Readings from Last 1 Encounters:  09/12/23 5' 10 (1.778 m)   Weight:  Wt Readings from Last 1 Encounters:  10/02/23 112 kg    Ideal Body Weight:  75.5 kg  BMI:  Body mass  index is 35.43 kg/m.  Estimated Nutritional Needs:   Kcal:  2000-2200  Protein:  105-120g  Fluid:  >/=2L  Blair Deaner MS, RD, LDN Registered Dietitian Clinical Nutrition RD Inpatient Contact Info in Amion

## 2023-10-02 NOTE — Plan of Care (Signed)
   Problem: Activity: Goal: Risk for activity intolerance will decrease Outcome: Progressing   Problem: Coping: Goal: Level of anxiety will decrease Outcome: Progressing   Problem: Pain Managment: Goal: General experience of comfort will improve and/or be controlled Outcome: Progressing

## 2023-10-03 DIAGNOSIS — K5901 Slow transit constipation: Secondary | ICD-10-CM | POA: Diagnosis not present

## 2023-10-03 DIAGNOSIS — L97519 Non-pressure chronic ulcer of other part of right foot with unspecified severity: Secondary | ICD-10-CM | POA: Diagnosis not present

## 2023-10-03 DIAGNOSIS — R5381 Other malaise: Secondary | ICD-10-CM

## 2023-10-03 DIAGNOSIS — I70235 Atherosclerosis of native arteries of right leg with ulceration of other part of foot: Secondary | ICD-10-CM | POA: Diagnosis not present

## 2023-10-03 MED ORDER — POLYETHYLENE GLYCOL 3350 17 G PO PACK
17.0000 g | PACK | Freq: Every day | ORAL | Status: DC
Start: 2023-10-03 — End: 2023-10-06
  Administered 2023-10-03 – 2023-10-06 (×4): 17 g via ORAL
  Filled 2023-10-03 (×4): qty 1

## 2023-10-03 MED ORDER — SENNOSIDES-DOCUSATE SODIUM 8.6-50 MG PO TABS
1.0000 | ORAL_TABLET | Freq: Two times a day (BID) | ORAL | Status: DC
  Administered 2023-10-03 – 2023-10-06 (×6): 1 via ORAL
  Filled 2023-10-03 (×5): qty 1

## 2023-10-03 MED ORDER — BISACODYL 5 MG PO TBEC: 5.0000 mg | DELAYED_RELEASE_TABLET | Freq: Every day | ORAL | Status: DC | PRN

## 2023-10-03 NOTE — Progress Notes (Addendum)
 PROGRESS NOTE   Jack [name]  FMW:[id number]    DOB: March 22, 1946    DOA: 09/11/2023  PCP: Orpha Yancey LABOR, MD   I have briefly reviewed patients previous medical records in [facility].    Brief Hospital Course:   78 year old with a history of HTN, CAD status post CABG, and HLD who admits to not taking his usual medications or caring for himself appropriately since the death of his wife in 2016/08/23. He was admitted to [facility] 09/03/2023 for evaluation of what was felt to be bilateral lower extremity cellulitis with ulcerations. He underwent I&D at that facility and was treated with empiric antibiotics. Wound cultures grew MRSA sensitive to doxycycline. ABIs of the right leg were noted to be significantly diminished and it was felt the patient would likely require vascular intervention for wound healing. Attempts were made to transfer him starting 1/13 but due to limited bed availability he did not arrive at [facility] until late night 09/11/2023. On his arrival at [facility] the patient has noted to have extensive wounds to both lower extremities but with no evidence of acute infection at this time. Most prominent are deep bilateral heel ulcers with a thick overlying eschar. Vascular surgery and palliative care was consulted.  Lower extremity wounds being managed conservatively with no plans for surgical intervention.  Patient has been medically stable for DC for several days and awaiting safe disposition/STR SNF placement which was denied by his insurance.  Family have initiated denial appeal, status pending.   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 1/18 are consistent with severe right and left lower extremity arterial disease at 0.37 and 0.32 respectively. Status post angiogram 1/24.   Patient was unable to tolerate MRI of the right foot/heel Patient and family were offered either palliative wound care of bilateral lower extremities versus bilateral AKA.  Family has had multiple conversations with vascular surgery and palliative care. Plan is to continue with palliative wound care rather than amputation.  Plan is for skilled nursing facility for short-term rehab and then to go home with his family.   He has completed course of antibiotics. Pain medications have been prescribed as needed. Although patient has been medically optimized for DC, SNF was declined by patient's insurance.  Family has initiated denial appeal, status pending. As per communication with TOC and palliative care medicine, palliative care team has discussed extensively with family in the past.  If SNF was declined, going home with hospice was discussed.  One of the sons reportedly was not in agreement because he was unable to care for patient but the other son said that he would care for him at home but needed him to be getting around more prior to taking him home.  Discussed with TOC 2/7.  No new updates.    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  Was not on a bowel regimen Started bowel  regimen.  Monitor.  Physical deconditioning While we await final decision regarding SNF, need to mobilize him. Discussed extensively with RN to try and get him up to the chair Continue therapies evaluation.  Body mass index is 36.06 kg/m.  Nutritional Status Nutrition Problem: Moderate Malnutrition Etiology: chronic illness, social / environmental circumstances Signs/Symptoms: moderate muscle depletion, mild fat depletion, moderate fat depletion Interventions: Ensure Enlive (each supplement provides 350kcal and 20 grams of protein), Magic cup, MVI Agree with above dietitian's evaluation and recommendations.  Pressure Ulcer: Pressure Injury 09/11/23 Sacrum Anterior;Mid Stage 1 -  Intact skin with non-blanchable redness of a localized area usually over a bony prominence. (Active)  09/11/23 2350  Location: Sacrum  Location Orientation: Anterior;Mid  Staging: Stage 1 -  Intact skin with non-blanchable redness of a localized area usually over a bony prominence.  Wound Description (Comments):   Present on Admission: Yes  Dressing Type Foam - Lift dressing to assess site every shift 10/03/23 0843     Pressure Injury 09/18/23 Buttocks Left Stage 2 -  Partial thickness loss of dermis presenting as a shallow open injury with a red, pink wound bed without slough. (Active)  09/18/23 2200  Location: Buttocks  Location Orientation: Left  Staging: Stage 2 -  Partial thickness loss of dermis presenting as a shallow open injury with a red, pink wound bed without slough.  Wound Description (Comments):   Present on Admission:   Dressing Type Foam - Lift dressing to assess site every shift 10/02/23 2124     Pressure Injury 09/19/23 Heel Right Unstageable - Full thickness tissue loss in which the base of the injury is covered by slough (yellow, tan, gray, green or brown) and/or eschar (tan, brown or black) in the wound bed. (Active)  09/19/23 1736  Location: Heel  Location Orientation: Right   Staging: Unstageable - Full thickness tissue loss in which the base of the injury is covered by slough (yellow, tan, gray, green or brown) and/or eschar (tan, brown or black) in the wound bed.  Wound Description (Comments):   Present on Admission: Yes  Dressing Type Foam - Lift dressing to assess site every shift 10/02/23 2124  Agree with above assessment regarding multiple wounds.  DVT prophylaxis: enoxaparin  (LOVENOX ) injection 40 mg Start: 09/12/23 1600     Code Status: Limited: Do not attempt resuscitation (DNR) -DNR-LIMITED -Do Not Intubate/DNI :  Family Communication: None at bedside. Disposition:  Status is: Inpatient Remains inpatient appropriate because: Unsafe disposition.  TOC on board.     Consultants:   Palliative care medicine Vascular surgery  Procedures:     Antimicrobials:      Subjective:  Patient states I do not feel good but does not elaborate.  Denies nausea, vomiting, dyspnea.  Only reports that both his heels hurt, left more than the right.   currently without his heel floaters/shoes, advised that these will be placed back on, initially reluctant but then agreeable.  States that the last time he was up in the chair was 2 days ago but the reclining chair was uncomfortable but is willing to try to sit again.  Discussed with RN to try and change his reclining  chair for sit him in an alternate chair.  Constipation.  No abdominal pain.  Objective:   Vitals:   10/02/23 2124 10/03/23 0550 10/03/23 0843 10/03/23 0942  BP: (!) 114/54 (!) 153/82  (!) 145/74  Pulse: 80 68  87  Resp: 18 17  15   Temp: 97.9 F (36.6 C) 98 F (36.7 C)  (!) 97.5 F (36.4 C)  TempSrc: Oral Oral  Oral  SpO2: 93% 92%  93%  Weight:   114 kg   Height:   5' 10 (1.778 m)     General exam: Elderly male, moderately built and frail, chronically ill looking lying comfortably propped up in bed without distress. Respiratory system: Clear to auscultation. Respiratory effort  normal. Cardiovascular system: S1 & S2 heard, RRR. No JVD, murmurs, rubs, gallops or clicks. No pedal edema.  Not on telemetry. Gastrointestinal system: Abdomen is nondistended, soft and nontender. No organomegaly or masses felt. Normal bowel sounds heard. Central nervous system: Alert and oriented. No focal neurological deficits. Extremities: Symmetric 5 x 5 power.  Bilateral lower legs with Ace wrapping.  Dressing clean, dry and intact.  No acute findings seen either heels. Skin: No rashes, lesions or ulcers Psychiatry: Judgement and insight impaired. Mood & affect flat.     Data Reviewed:   I have personally reviewed following labs and imaging studies   CBC: No results for input(s): WBC, NEUTROABS, HGB, HCT, MCV, PLT in the last 168 hours.   Basic Metabolic Panel: No results for input(s): NA, K, CL, CO2, GLUCOSE, BUN, CREATININE, CALCIUM , MG, PHOS in the last 168 hours.   Liver Function Tests: No results for input(s): AST, ALT, ALKPHOS, BILITOT, PROT, ALBUMIN in the last 168 hours.  CBG: No results for input(s): GLUCAP in the last 168 hours.  Microbiology Studies:  No results found for this or any previous visit (from the past 240 hours).  Radiology Studies:  No results found.  Scheduled Meds:    ascorbic acid   250 mg Oral BID   aspirin  EC  81 mg Oral Daily   enoxaparin  (LOVENOX ) injection  40 mg Subcutaneous Q24H   escitalopram   5 mg Oral Daily   feeding supplement  237 mL Oral BID BM   multivitamin with minerals  1 tablet Oral Daily   polyethylene glycol  17 g Oral Daily   rosuvastatin   20 mg Oral Daily   senna-docusate  1 tablet Oral BID   sodium chloride  flush  3 mL Intravenous Q12H    Continuous Infusions:     LOS: 22 days     Trenda Mar, MD,  FACP, [facility], [facility], [facility]   [facility] Advisor [place]      To contact the attending provider between 7A-7P or the covering  provider during after hours 7P-7A, please log into the web site [email or web address] and access using universal [place] password for that web site. If you do not have the password, please call the hospital operator.  10/03/2023, 1:43 PM

## 2023-10-03 NOTE — Plan of Care (Signed)
   Problem: Health Behavior/Discharge Planning: Goal: Ability to manage health-related needs will improve Outcome: Progressing   Problem: Nutrition: Goal: Adequate nutrition will be maintained Outcome: Progressing   Problem: Coping: Goal: Level of anxiety will decrease Outcome: Progressing

## 2023-10-04 DIAGNOSIS — R5381 Other malaise: Secondary | ICD-10-CM | POA: Diagnosis not present

## 2023-10-04 DIAGNOSIS — L97519 Non-pressure chronic ulcer of other part of right foot with unspecified severity: Secondary | ICD-10-CM | POA: Diagnosis not present

## 2023-10-04 DIAGNOSIS — K5901 Slow transit constipation: Secondary | ICD-10-CM | POA: Diagnosis not present

## 2023-10-04 DIAGNOSIS — I70235 Atherosclerosis of native arteries of right leg with ulceration of other part of foot: Secondary | ICD-10-CM | POA: Diagnosis not present

## 2023-10-04 MED ORDER — BISACODYL 10 MG RE SUPP: 10.0000 mg | Freq: Once | RECTAL | Status: DC

## 2023-10-04 NOTE — Plan of Care (Signed)
  Problem: Education: Goal: Knowledge of General Education information will improve Description: Including pain rating scale, medication(s)/side effects and non-pharmacologic comfort measures Outcome: Not Progressing   Problem: Activity: Goal: Risk for activity intolerance will decrease Outcome: Not Progressing   Problem: Nutrition: Goal: Adequate nutrition will be maintained Outcome: Not Progressing   Problem: Coping: Goal: Level of anxiety will decrease Outcome: Not Progressing

## 2023-10-04 NOTE — Progress Notes (Signed)
 PROGRESS NOTE   Jack [name]  FMW:[id number]    DOB: 02-23-1946    DOA: 09/11/2023  PCP: Orpha Yancey LABOR, MD   I have briefly reviewed patients previous medical records in [facility].    Brief Hospital Course:   78 year old with a history of HTN, CAD status post CABG, and HLD who admits to not taking his usual medications or caring for himself appropriately since the death of his wife in 06-Aug-2016. He was admitted to [facility] 09/03/2023 for evaluation of what was felt to be bilateral lower extremity cellulitis with ulcerations. He underwent I&D at that facility and was treated with empiric antibiotics. Wound cultures grew MRSA sensitive to doxycycline. ABIs of the right leg were noted to be significantly diminished and it was felt the patient would likely require vascular intervention for wound healing. Attempts were made to transfer him starting 1/13 but due to limited bed availability he did not arrive at [facility] until late night 09/11/2023. On his arrival at [facility] the patient has noted to have extensive wounds to both lower extremities but with no evidence of acute infection at this time. Most prominent are deep bilateral heel ulcers with a thick overlying eschar. Vascular surgery and palliative care was consulted.  Lower extremity wounds being managed conservatively with no plans for surgical intervention.  Patient has been medically stable for DC for several days and awaiting safe disposition/STR SNF placement which was denied by his insurance.  Family have initiated denial appeal, status pending.   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) Active Problems:   Ulcers of both lower legs (HCC)   Hyperlipidemia LDL goal <70   Essential hypertension, benign   CKD stage 3a, GFR 45-59 ml/min (HCC)   Self neglect   Malnutrition of moderate degree   Bilateral lower extremity ulcerations and deep tissue injury to bilateral  heels PAD ABIs obtained at [facility] 1/18 are consistent with severe right and left lower extremity arterial disease at 0.37 and 0.32 respectively. Status post angiogram 1/24.   Patient was unable to tolerate MRI of the right foot/heel Patient and family were offered either palliative wound care of bilateral lower extremities versus bilateral AKA.  Family has had multiple conversations with vascular surgery and palliative care. Plan is to continue with palliative wound care rather than amputation.  Plan is for skilled nursing facility for short-term rehab and then to go home with his family.   He has completed course of antibiotics. Pain medications have been prescribed as needed. Although patient has been medically optimized for DC, SNF was declined by patient's insurance.  Family has initiated denial appeal, status pending. As per communication with TOC and palliative care medicine, palliative care team has discussed extensively with family in the past.  If SNF was declined, going home with hospice was discussed.  One of the sons reportedly was not in agreement because he was unable to care for patient but the other son said that he would care for him at home but needed him to be getting around more prior to taking him home.  Discussed with TOC 2/7.  No new updates.    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  No labs in over a week, will recheck to ensure stability.   Essential hypertension -Stable   HLD -Crestor    Constipation, slow transit Per  nursing, last BM 2/3.  Was not on a bowel regimen Started bowel regimen.  Monitor.  Offered patient suppository or enema but patient declining and states that he will have a BM when he will have it.  Physical deconditioning While we await final decision regarding SNF, need to mobilize him. Discussed extensively with RN to try and get him up to the chair Continue therapies evaluation.  Body mass index is 34.48 kg/m.  Nutritional Status Nutrition Problem: Moderate Malnutrition Etiology: chronic illness, social / environmental circumstances Signs/Symptoms: moderate muscle depletion, mild fat depletion, moderate fat depletion Interventions: Ensure Enlive (each supplement provides 350kcal and 20 grams of protein), Magic cup, MVI Agree with above dietitian's evaluation and recommendations.  Pressure Ulcer: Pressure Injury 09/11/23 Sacrum Anterior;Mid Stage 1 -  Intact skin with non-blanchable redness of a localized area usually over a bony prominence. (Active)  09/11/23 2350  Location: Sacrum  Location Orientation: Anterior;Mid  Staging: Stage 1 -  Intact skin with non-blanchable redness of a localized area usually over a bony prominence.  Wound Description (Comments):   Present on Admission: Yes  Dressing Type Foam - Lift dressing to assess site every shift 10/03/23 2000     Pressure Injury 09/18/23 Buttocks Left Stage 2 -  Partial thickness loss of dermis presenting as a shallow open injury with a red, pink wound bed without slough. (Active)  09/18/23 2200  Location: Buttocks  Location Orientation: Left  Staging: Stage 2 -  Partial thickness loss of dermis presenting as a shallow open injury with a red, pink wound bed without slough.  Wound Description (Comments):   Present on Admission:   Dressing Type Foam - Lift dressing to assess site every shift 10/03/23 2000     Pressure Injury 09/19/23 Heel Right Unstageable - Full thickness tissue loss in which the base of the injury is  covered by slough (yellow, tan, gray, green or brown) and/or eschar (tan, brown or black) in the wound bed. (Active)  09/19/23 1736  Location: Heel  Location Orientation: Right  Staging: Unstageable - Full thickness tissue loss in which the base of the injury is covered by slough (yellow, tan, gray, green or brown) and/or eschar (tan, brown or black) in the wound bed.  Wound Description (Comments):   Present on Admission: Yes  Dressing Type Foam - Lift dressing to assess site every shift 10/03/23 2000  Agree with above assessment regarding multiple wounds.  DVT prophylaxis: enoxaparin  (LOVENOX ) injection 40 mg Start: 09/12/23 1600     Code Status: Limited: Do not attempt resuscitation (DNR) -DNR-LIMITED -Do Not Intubate/DNI :  Family Communication: None at bedside. Disposition:  Status is: Inpatient Remains inpatient appropriate because: Unsafe disposition.  TOC on board.     Consultants:   Palliative care medicine Vascular surgery  Procedures:     Antimicrobials:      Subjective:  Patient tells me that he was up in chair for about 1.5 hours.  Still without BM but refuses suppository or enemas.  Reports on going pain in both heels and feels that this is due to some dressing change in the last 2 days-communicated with nursing to address as needed.  Objective:  Vitals:   10/04/23 0448 10/04/23 0455 10/04/23 0915 10/04/23 1115  BP: 134/62  134/66   Pulse: 67  83   Resp: 18  18 18   Temp: 98.3 F (36.8 C)  98.7 F (37.1 C)   TempSrc: Oral  Oral   SpO2: 91%  99% 99%  Weight:  109 kg    Height:        General exam: Elderly male, moderately built and frail, chronically ill looking lying comfortably propped up in bed without distress. Respiratory system: Clear to auscultation. Respiratory effort normal. Cardiovascular system: S1 & S2 heard, RRR. No JVD, murmurs, rubs, gallops or clicks. No pedal edema.  Not on telemetry. Gastrointestinal system: Abdomen is nondistended,  soft and nontender. No organomegaly or masses felt. Normal bowel sounds heard. Central nervous system: Alert and oriented. No focal neurological deficits. Extremities: Symmetric 5 x 5 power.  Bilateral lower legs with Ace wrapping.  Dressing clean, dry and intact.  No acute findings noted in either heels. Skin: No rashes, lesions or ulcers Psychiatry: Judgement and insight impaired. Mood & affect flat.     Data Reviewed:   I have personally reviewed following labs and imaging studies   CBC: No results for input(s): WBC, NEUTROABS, HGB, HCT, MCV, PLT in the last 168 hours.   Basic Metabolic Panel: No results for input(s): NA, K, CL, CO2, GLUCOSE, BUN, CREATININE, CALCIUM , MG, PHOS in the last 168 hours.   Liver Function Tests: No results for input(s): AST, ALT, ALKPHOS, BILITOT, PROT, ALBUMIN in the last 168 hours.  CBG: No results for input(s): GLUCAP in the last 168 hours.  Microbiology Studies:  No results found for this or any previous visit (from the past 240 hours).  Radiology Studies:  No results found.  Scheduled Meds:    ascorbic acid   250 mg Oral BID   aspirin  EC  81 mg Oral Daily   bisacodyl   10 mg Rectal Once   enoxaparin  (LOVENOX ) injection  40 mg Subcutaneous Q24H   escitalopram   5 mg Oral Daily   feeding supplement  237 mL Oral BID BM   multivitamin with minerals  1 tablet Oral Daily   polyethylene glycol  17 g Oral Daily   rosuvastatin   20 mg Oral Daily   senna-docusate  1 tablet Oral BID   sodium chloride  flush  3 mL Intravenous Q12H    Continuous Infusions:     LOS: 23 days     Trenda Mar, MD,  FACP, [facility], [facility], [facility]   [facility] [place]      To contact the attending provider between 7A-7P or the covering provider during after hours 7P-7A, please log into the web site [email or web address] and access using universal [place] password for that web site.  If you do not have the password, please call the hospital operator.  10/04/2023, 1:13 PM

## 2023-10-04 NOTE — Plan of Care (Signed)

## 2023-10-05 DIAGNOSIS — Z515 Encounter for palliative care: Secondary | ICD-10-CM | POA: Diagnosis not present

## 2023-10-05 DIAGNOSIS — Z7189 Other specified counseling: Secondary | ICD-10-CM

## 2023-10-05 DIAGNOSIS — L97519 Non-pressure chronic ulcer of other part of right foot with unspecified severity: Secondary | ICD-10-CM | POA: Diagnosis not present

## 2023-10-05 DIAGNOSIS — K5901 Slow transit constipation: Secondary | ICD-10-CM | POA: Diagnosis not present

## 2023-10-05 DIAGNOSIS — I70235 Atherosclerosis of native arteries of right leg with ulceration of other part of foot: Secondary | ICD-10-CM | POA: Diagnosis not present

## 2023-10-05 DIAGNOSIS — R5381 Other malaise: Secondary | ICD-10-CM | POA: Diagnosis not present

## 2023-10-05 LAB — COMPREHENSIVE METABOLIC PANEL
ALT: 22 U/L (ref 0–44)
AST: 18 U/L (ref 15–41)
Albumin: 2.9 g/dL — ABNORMAL LOW (ref 3.5–5.0)
Alkaline Phosphatase: 65 U/L (ref 38–126)
Anion gap: 15 (ref 5–15)
BUN: 18 mg/dL (ref 8–23)
CO2: 26 mmol/L (ref 22–32)
Calcium: 9.4 mg/dL (ref 8.9–10.3)
Chloride: 96 mmol/L — ABNORMAL LOW (ref 98–111)
Creatinine, Ser: 1 mg/dL (ref 0.61–1.24)
GFR, Estimated: >60 mL/min (ref >60)
Glucose, Bld: 98 mg/dL (ref 70–99)
Potassium: 4.3 mmol/L (ref 3.5–5.1)
Sodium: 137 mmol/L (ref 135–145)
Total Bilirubin: 0.6 mg/dL (ref 0.0–1.2)
Total Protein: 6.3 g/dL — ABNORMAL LOW (ref 6.5–8.1)

## 2023-10-05 LAB — CBC
HCT: 40.2 % (ref 39.0–52.0)
Hemoglobin: 12.9 g/dL — ABNORMAL LOW (ref 13.0–17.0)
MCH: 31.6 pg (ref 26.0–34.0)
MCHC: 32.1 g/dL (ref 30.0–36.0)
MCV: 98.5 fL (ref 80.0–100.0)
Platelets: 183 10*3/uL (ref 150–400)
RBC: 4.08 MIL/uL — ABNORMAL LOW (ref 4.22–5.81)
RDW: 15.5 % (ref 11.5–15.5)
WBC: 11.2 10*3/uL — ABNORMAL HIGH (ref 4.0–10.5)
nRBC: 0 % (ref 0.0–0.2)

## 2023-10-05 LAB — MAGNESIUM: Magnesium: 1.9 mg/dL (ref 1.7–2.4)

## 2023-10-05 NOTE — Plan of Care (Signed)
  Problem: Education: Goal: Knowledge of General Education information will improve Description: Including pain rating scale, medication(s)/side effects and non-pharmacologic comfort measures Outcome: Not Progressing   Problem: Health Behavior/Discharge Planning: Goal: Ability to manage health-related needs will improve Outcome: Not Progressing   Problem: Clinical Measurements: Goal: Ability to maintain clinical measurements within normal limits will improve Outcome: Not Progressing Goal: Will remain free from infection Outcome: Not Progressing Goal: Diagnostic test results will improve Outcome: Not Progressing Goal: Respiratory complications will improve Outcome: Not Progressing Goal: Cardiovascular complication will be avoided Outcome: Not Progressing   Problem: Activity: Goal: Risk for activity intolerance will decrease Outcome: Not Progressing   Problem: Nutrition: Goal: Adequate nutrition will be maintained Outcome: Not Progressing   Problem: Coping: Goal: Level of anxiety will decrease Outcome: Not Progressing   Problem: Elimination: Goal: Will not experience complications related to bowel motility Outcome: Not Progressing Goal: Will not experience complications related to urinary retention Outcome: Not Progressing   Problem: Pain Managment: Goal: General experience of comfort will improve and/or be controlled Outcome: Not Progressing   Problem: Safety: Goal: Ability to remain free from injury will improve Outcome: Not Progressing   Problem: Skin Integrity: Goal: Risk for impaired skin integrity will decrease Outcome: Not Progressing   Problem: Education: Goal: Understanding of CV disease, CV risk reduction, and recovery process will improve Outcome: Not Progressing Goal: Individualized Educational Video(s) Outcome: Not Progressing   Problem: Activity: Goal: Ability to return to baseline activity level will improve Outcome: Not Progressing   Problem:  Cardiovascular: Goal: Ability to achieve and maintain adequate cardiovascular perfusion will improve Outcome: Not Progressing Goal: Vascular access site(s) Level 0-1 will be maintained Outcome: Not Progressing   Problem: Health Behavior/Discharge Planning: Goal: Ability to safely manage health-related needs after discharge will improve Outcome: Not Progressing

## 2023-10-05 NOTE — TOC Progression Note (Signed)
 Transition of Care [facility]) - Progression Note    Patient Details  Name: Jack [name] MRN: [id number] Date of Birth: September 09, 1945  [facility] CM/SW Contact  Elspeth Hals, LCSW Phone Number: 10/05/2023, 1:52 PM  Clinical Narrative:   No update on appeal in navi portal.     Expected Discharge Plan: Skilled Nursing Facility Barriers to Discharge: Continued Medical Work up  Expected Discharge Plan and Services                                               Social Determinants of Health (SDOH) Interventions SDOH Screenings   Food Insecurity: No Food Insecurity (09/11/2023)  Housing: Low Risk  (09/11/2023)  Transportation Needs: No Transportation Needs (09/11/2023)  Utilities: Not At Risk (09/11/2023)  Alcohol Screen: Low Risk  (02/02/2018)  Depression (PHQ2-9): Low Risk  (04/10/2020)  Financial Resource Strain: Low Risk  (09/04/2023)   Received from [facility]  Social Connections: Socially Isolated (09/11/2023)  Tobacco Use: High Risk (09/22/2023)    Readmission Risk Interventions     No data to display

## 2023-10-05 NOTE — Plan of Care (Signed)
  Problem: Clinical Measurements: Goal: Ability to maintain clinical measurements within normal limits will improve Outcome: Not Progressing   Problem: Activity: Goal: Risk for activity intolerance will decrease Outcome: Not Progressing   Problem: Skin Integrity: Goal: Risk for impaired skin integrity will decrease Outcome: Not Progressing

## 2023-10-05 NOTE — Progress Notes (Signed)
 Palliative:  HPI:  78 y.o. male  with past medical history of hypertension, CAD s/p CABG, hyperlipidemia admitted as transfer from [facility] on 09/11/2023 with foot wounds and PAD. Vascular has noted that treatment would involve bilateral AKA.   Jack [name] is sleeping and I do not awaken him. I called and spoke with son, Jack [name]. Jack [name] is awaiting appeal to insurance for SNF rehab. We discussed potential for denial OR potential that his father does not improve to a point that Jack [name] is able to manage him at home. We discussed potential need for long term care at some point. We discussed the potential need to apply for Medicaid to assist with facility placement. Jack [name] is willing to apply for Medicaid on behalf of his father. Unfortunately Jack [name] is unable to care for his father in this state. We will await results of appeal. I sent update to CSW after my conversation with Jack [name]. Jack [name] has no further questions or concerns regarding his father's condition or path forward. Unfortunately, this is mostly a disposition issue at this time. Jack [name] has my contact and will call me with further needs. Palliative will shadow and engage as indicated.   Plan: - DNR/DNI - No amputation - Awaiting placement  25 min  Vila Grayer, NP Palliative Medicine Team Pager [phone] (Please see [email or web address] for schedule) Team Phone [phone]

## 2023-10-05 NOTE — Progress Notes (Signed)
 PROGRESS NOTE   Jack [name]  NWG:[id number]    DOB: [birth year]    DOA: 09/11/2023  PCP: Veda Gerald, MD   I have briefly reviewed patients previous medical records in [facility]    Brief Hospital Course:   78 year old with a history of HTN, CAD status post CABG, and HLD who admits to not taking his usual medications or caring for himself appropriately since the death of his wife in 02-02-2016. He was admitted to [facility] 09/03/2023 for evaluation of what was felt to be bilateral lower extremity cellulitis with ulcerations. He underwent I&D at that facility and was treated with empiric antibiotics. Wound cultures grew MRSA sensitive to doxycycline. ABIs of the right leg were noted to be significantly diminished and it was felt the patient would likely require vascular intervention for wound healing. Attempts were made to transfer him starting 1/13 but due to limited bed availability he did not arrive at [facility] until late night 09/11/2023. On his arrival at [facility] the patient has noted to have extensive wounds to both lower extremities but with no evidence of acute infection at this time. Most prominent are deep bilateral heel ulcers with a thick overlying eschar. Vascular surgery and palliative care was consulted.  Lower extremity wounds being managed conservatively with no plans for surgical intervention.  Patient has been medically stable for DC for several days and awaiting safe disposition/STR SNF placement which was denied by his insurance.  Family have initiated denial appeal, status pending as of TOC check on 2/10.   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 Status post angiogram 1/24.   Patient was unable to tolerate MRI of the right foot/heel Patient and family were offered either palliative wound care of bilateral lower extremities versus bilateral AKA.  Family has had multiple conversations with vascular surgery and palliative care. Plan is to continue with palliative wound care rather than amputation.  Plan is for skilled nursing facility for short-term rehab and then to go home with his family.   He has completed course of antibiotics. Pain medications have been prescribed as needed. Although patient has been medically optimized for DC, SNF was declined by patient's insurance.  Family has initiated denial appeal, status pending. As per communication with TOC and palliative care medicine, palliative care team has discussed extensively with family in the past.  If SNF was declined, going home with hospice was discussed.  One of the sons reportedly was not in agreement because he was unable to care for patient but the other son said that he would care for him at home but needed him to be getting around more prior to taking him home. Removed all dressings of his legs with RN assistance and examined 2/10.  Extensive wounds.  All appear clean but causing him significant pain and discomfort.  Patient refusing heel floaters, turns or activity as per RN.    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  No labs in over a week, will recheck to ensure stability.   Essential hypertension -Stable   HLD -Crestor    Constipation, slow transit Last BM 2/5.  Continue bowel regimen.  Monitor.  Offered patient suppository or enema but patient declining and states that he will have a BM when he will have it.  Physical deconditioning While we await final decision regarding SNF, need to mobilize him. Discussed extensively with RN to try and get him up to the chair Continue therapies evaluation.  Body mass index is 34.48 kg/m.  Nutritional Status Nutrition Problem: Moderate Malnutrition Etiology: chronic illness, social / environmental circumstances Signs/Symptoms: moderate muscle depletion, mild fat depletion, moderate fat depletion Interventions: Ensure Enlive (each supplement provides 350kcal and 20 grams of protein), Magic cup, MVI Agree with above dietitian's evaluation and recommendations.  Pressure Ulcer: Pressure Injury 09/11/23 Sacrum Anterior;Mid Stage 1 -  Intact skin with non-blanchable redness of a localized area usually over a bony prominence. (Active)  09/11/23 2350  Location: Sacrum  Location Orientation: Anterior;Mid  Staging: Stage 1 -  Intact skin with non-blanchable redness of a localized area usually over a bony prominence.  Wound Description (Comments):   Present on Admission: Yes  Dressing Type Foam - Lift dressing to assess site every shift 10/05/23 1100     Pressure Injury 09/18/23 Buttocks Left Stage 2 -  Partial thickness loss of dermis presenting as a shallow open injury with a red, pink wound bed without slough. (Active)  09/18/23 2200  Location: Buttocks  Location Orientation: Left  Staging: Stage 2 -  Partial thickness loss of dermis presenting as a shallow open injury with a red, pink wound bed without slough.  Wound Description (Comments):   Present on Admission:   Dressing Type Foam - Lift dressing to assess  site every shift 10/04/23 2000     Pressure Injury 09/19/23 Heel Right Unstageable - Full thickness tissue loss in which the base of the injury is covered by slough (yellow, tan, gray, green or brown) and/or eschar (tan, brown or black) in the wound bed. (Active)  09/19/23 1736  Location: Heel  Location Orientation: Right  Staging: Unstageable - Full thickness tissue loss in which the base of the injury is covered by slough (yellow, tan, gray, green or brown) and/or eschar (tan, brown or black) in the wound bed.  Wound Description (Comments):   Present on Admission: Yes  Dressing Type Foam - Lift dressing to assess site every shift 10/05/23 1100  Agree with above assessment regarding multiple wounds.  DVT prophylaxis: enoxaparin  (LOVENOX ) injection 40 mg Start: 09/12/23 1600     Code Status: Limited: Do not attempt resuscitation (DNR) -DNR-LIMITED -Do Not Intubate/DNI :  Family Communication: None at bedside. Disposition:  Status is: Inpatient Remains inpatient appropriate because: Unsafe disposition.  TOC on board.     Consultants:   Palliative care medicine Vascular surgery  Procedures:     Antimicrobials:      Subjective:  Ongoing pain in both legs and heels.  Per nursing, patient refusing several aspects of care including heel floaters, turning or activity.  Objective:   Vitals:  10/04/23 1419 10/04/23 1927 10/05/23 0742 10/05/23 1507  BP: (!) 105/59 125/84 (!) 153/123 (!) 148/85  Pulse: (!) 59 65 75 74  Resp: 18 18 17 18   Temp: 97.7 F (36.5 C) 97.7 F (36.5 C) 98.4 F (36.9 C) 98.6 F (37 C)  TempSrc: Oral Oral    SpO2: 93% 93% 95% 96%  Weight:      Height:        General exam: Elderly male, moderately built and frail, chronically ill looking lying comfortably propped up in bed without distress. Respiratory system: Clear to auscultation. Respiratory effort normal. Cardiovascular system: S1 & S2 heard, RRR. No JVD, murmurs, rubs, gallops or clicks. No  pedal edema.  Not on telemetry. Gastrointestinal system: Abdomen is nondistended, soft and nontender. No organomegaly or masses felt. Normal bowel sounds heard. Central nervous system: Alert and oriented. No focal neurological deficits. Extremities: Symmetric 5 x 5 power.  Extensive bilateral leg and heel wounds as noted in pictures below from 2/10.  Appear clean without acute infection at this time.  Significant eschar on bilateral heel wounds. Skin: No rashes, lesions or ulcers Psychiatry: Judgement and insight impaired. Mood & affect flat.   R leg anterior 10/05/23     R heel 10/05/23   R leg posterior 10/05/23   L leg anterior 10/15/23   L heel 10/05/23   L leg posterior 10/05/23  Data Reviewed:   I have personally reviewed following labs and imaging studies   CBC: Recent Labs  Lab 10/05/23 0550  WBC 11.2*  HGB 12.9*  HCT 40.2  MCV 98.5  PLT 183     Basic Metabolic Panel: Recent Labs  Lab 10/05/23 0550  NA 137  K 4.3  CL 96*  CO2 26  GLUCOSE 98  BUN 18  CREATININE 1.00  CALCIUM  9.4  MG 1.9     Liver Function Tests: Recent Labs  Lab 10/05/23 0550  AST 18  ALT 22  ALKPHOS 65  BILITOT 0.6  PROT 6.3*  ALBUMIN 2.9*    CBG: No results for input(s): "GLUCAP" in the last 168 hours.  Microbiology Studies:  No results found for this or any previous visit (from the past 240 hours).  Radiology Studies:  No results found.  Scheduled Meds:    ascorbic acid   250 mg Oral BID   aspirin  EC  81 mg Oral Daily   bisacodyl   10 mg Rectal Once   enoxaparin  (LOVENOX ) injection  40 mg Subcutaneous Q24H   escitalopram   5 mg Oral Daily   feeding supplement  237 mL Oral BID BM   multivitamin with minerals  1 tablet Oral Daily   polyethylene glycol  17 g Oral Daily   rosuvastatin   20 mg Oral Daily   senna-docusate  1 tablet Oral BID   sodium chloride  flush  3 mL Intravenous Q12H    Continuous Infusions:     LOS: 24 days     Aubrey Blas, MD,   FACP, [facility], [facility], [facility]   [facility] [place]      To contact the attending provider between 7A-7P or the covering provider during after hours 7P-7A, please log into the web site [email or web address] and access using universal [place] password for that web site. If you do not have the password, please call the hospital operator.  10/05/2023, 4:09 PM

## 2023-10-05 NOTE — Progress Notes (Signed)
 Pt refusing to wear off loading boots (z boots). Pt educated on importance however pt did not want to listen. MD aware and at bedside new Pictures taken of bilateral lower extremities. Pt refusing any activity, and occasionally refusing turns

## 2023-10-06 DIAGNOSIS — I70235 Atherosclerosis of native arteries of right leg with ulceration of other part of foot: Secondary | ICD-10-CM | POA: Diagnosis not present

## 2023-10-06 DIAGNOSIS — L97519 Non-pressure chronic ulcer of other part of right foot with unspecified severity: Secondary | ICD-10-CM | POA: Diagnosis not present

## 2023-10-06 DIAGNOSIS — K5901 Slow transit constipation: Secondary | ICD-10-CM | POA: Diagnosis not present

## 2023-10-06 DIAGNOSIS — R5381 Other malaise: Secondary | ICD-10-CM | POA: Diagnosis not present

## 2023-10-06 MED ORDER — POLYETHYLENE GLYCOL 3350 17 G PO PACK
17.0000 g | PACK | Freq: Two times a day (BID) | ORAL | Status: DC
  Administered 2023-10-06 – 2023-10-07 (×2): 17 g via ORAL
  Filled 2023-10-06 (×2): qty 1

## 2023-10-06 MED ORDER — SENNOSIDES-DOCUSATE SODIUM 8.6-50 MG PO TABS
2.0000 | ORAL_TABLET | Freq: Two times a day (BID) | ORAL | Status: DC
  Administered 2023-10-06 – 2023-10-07 (×2): 2 via ORAL
  Filled 2023-10-06 (×2): qty 2

## 2023-10-06 MED ORDER — MILK AND MOLASSES ENEMA: 1.0000 | Freq: Once | RECTAL | Status: DC | PRN

## 2023-10-06 MED ORDER — BISACODYL 10 MG RE SUPP
10.0000 mg | Freq: Once | RECTAL | Status: AC
  Administered 2023-10-06: 10 mg via RECTAL
  Filled 2023-10-06: qty 1

## 2023-10-06 NOTE — Progress Notes (Signed)
PROGRESS NOTE   Jack [name]  WUJ:[id number]    DOB: [birth year]    DOA: 09/11/2023  PCP: Toma Deiters, MD   I have briefly reviewed patients previous medical records in [facility].    Brief Hospital Course:   78 year old with a history of HTN, CAD status post CABG, and HLD who admits to not taking his usual medications or caring for himself appropriately since the death of his wife in 29-Oct-2015. He was admitted to [facility] 09/03/2023 for evaluation of what was felt to be bilateral lower extremity cellulitis with ulcerations. He underwent I&D at that facility and was treated with empiric antibiotics. Wound cultures grew MRSA sensitive to doxycycline. ABIs of the right leg were noted to be significantly diminished and it was felt the patient would likely require vascular intervention for wound healing. Attempts were made to transfer him starting 1/13 but due to limited bed availability he did not arrive at [facility] until late night 09/11/2023. On his arrival at [facility] the patient has noted to have extensive wounds to both lower extremities but with no evidence of acute infection at this time. Most prominent are deep bilateral heel ulcers with a thick overlying eschar. Vascular surgery and palliative care was consulted.  Lower extremity wounds being managed conservatively with no plans for surgical intervention.  Patient has been medically stable for DC for several days and awaiting safe disposition/STR SNF placement which was denied by his insurance.  Family have initiated denial appeal, status pending as of TOC check on 2/10.   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 1/18 are consistent with severe right and left lower extremity arterial disease at 0.37 and 0.32 respectively. Status post angiogram 1/24.   Patient was unable to tolerate MRI of the right foot/heel Patient and family were offered either palliative wound care of bilateral lower extremities versus bilateral AKA.  Family has had multiple conversations with vascular surgery and palliative care. Plan is to continue with palliative wound care rather than amputation.  Plan is for skilled nursing facility for short-term rehab and then to go home with his family.   He has completed course of antibiotics. Pain medications have been prescribed as needed. Although patient has been medically optimized for DC, SNF was declined by patient's insurance.  Family has initiated denial appeal, status pending. As per communication with TOC and palliative care medicine, palliative care team has discussed extensively with family in the past.  If SNF was declined, going home with hospice was discussed.  One of the sons reportedly was not in agreement because he was unable to care for patient but the other son said that he would care for him at home but needed him to be getting around more prior to taking him home. Removed all dressings of his legs with RN assistance and examined 2/10.  Extensive wounds.  All appear clean but causing him significant pain and discomfort.  Patient refusing heel floaters, turns or activity as per RN.    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  No labs in over a week, will recheck to ensure stability.   Essential hypertension -Stable   HLD -Crestor   Constipation, slow transit Last BM 2/5.  Continue bowel regimen.  Monitor.  Offered patient suppository or enema but patient declining and states that he will have a BM when he will have it. Today willing to try Dulcolax suppository and if that does not work then even an enema.  Physical deconditioning While we await final decision regarding SNF, need to mobilize him. Discussed extensively with RN to try and get him up to the chair Continue therapies evaluation.  Body mass index is 34.48 kg/m.  Nutritional Status Nutrition Problem: Moderate Malnutrition Etiology: chronic illness, social / environmental circumstances Signs/Symptoms: moderate muscle depletion, mild fat depletion, moderate fat depletion Interventions: Ensure Enlive (each supplement provides 350kcal and 20 grams of protein), Magic cup, MVI Agree with above dietitian's evaluation and recommendations.  Pressure Ulcer: Pressure Injury 09/11/23 Sacrum Anterior;Mid Stage 1 -  Intact skin with non-blanchable redness of a localized area usually over a bony prominence. (Active)  09/11/23 2350  Location: Sacrum  Location Orientation: Anterior;Mid  Staging: Stage 1 -  Intact skin with non-blanchable redness of a localized area usually over a bony prominence.  Wound Description (Comments):   Present on Admission: Yes  Dressing Type Foam - Lift dressing to assess site every shift 10/06/23 0826     Pressure Injury 09/18/23 Buttocks Left Stage 2 -  Partial thickness loss of dermis presenting as a shallow open injury with a red, pink wound bed without slough. (Active)  09/18/23 2200  Location: Buttocks  Location Orientation: Left  Staging: Stage 2 -  Partial thickness loss of dermis presenting as a shallow open injury with a red, pink wound bed without slough.  Wound  Description (Comments):   Present on Admission:   Dressing Type Foam - Lift dressing to assess site every shift 10/04/23 2000     Pressure Injury 09/19/23 Heel Right Unstageable - Full thickness tissue loss in which the base of the injury is covered by slough (yellow, tan, gray, green or brown) and/or eschar (tan, brown or black) in the wound bed. (Active)  09/19/23 1736  Location: Heel  Location Orientation: Right  Staging: Unstageable - Full thickness tissue loss in which the base of the injury is covered by slough (yellow, tan, gray, green or brown) and/or eschar (tan, brown or black) in the wound bed.  Wound Description (Comments):   Present on Admission: Yes  Dressing Type Foam - Lift dressing to assess site every shift 10/05/23 2002  Agree with above assessment regarding multiple wounds.  DVT prophylaxis: enoxaparin (LOVENOX) injection 40 mg Start: 09/12/23 1600     Code Status: Limited: Do not attempt resuscitation (DNR) -DNR-LIMITED -Do Not Intubate/DNI :  Family Communication: None at bedside. Disposition:  Status is: Inpatient Remains inpatient appropriate because: Unsafe disposition.  TOC on board.     Consultants:   Palliative care medicine Vascular surgery  Procedures:     Antimicrobials:      Subjective:  Evaluated along with patient's RN at bedside and therapy at bedside.  Patient reports that he feels "okay".  No BM for a few days now.  No abdominal pain reported.  Did not report much pain in his legs.  Willing to try suppository or enema.  Advised him that he really needs to work with therapy and not refuse care.  He verbalized understanding.   Objective:   Vitals:   10/05/23 0742 10/05/23 1507 10/05/23 2117 10/06/23 0431  BP: (!) 153/123 (!) 148/85 136/87 127/68  Pulse: 75 74 76 63  Resp: 17 18 18 18   Temp: 98.4 F (36.9 C) 98.6 F (37 C) 98.4 F (36.9 C) 98.5 F (36.9 C)  TempSrc:   Oral Oral  SpO2: 95% 96% 98% 96%  Weight:      Height:         General exam: Elderly male, moderately built and frail, chronically ill looking lying in bed.  Did not appear in any discomfort. Respiratory system: Clear to auscultation. Respiratory effort normal. Cardiovascular system: S1 & S2 heard, RRR. No JVD, murmurs, rubs, gallops or clicks. No pedal edema.  Not on telemetry. Gastrointestinal system: Abdomen is nondistended, soft and nontender. No organomegaly or masses felt. Normal bowel sounds heard. Central nervous system: Alert and oriented. No focal neurological deficits. Extremities: Symmetric 5 x 5 power.  Extensive bilateral leg and heel wounds as noted in pictures below from 2/10.  Appear clean without acute infection at this time.  Significant eschar on bilateral heel wounds.  Bilateral leg dressings clean and dry.  No heel floaters (patient refusing). Skin: No rashes, lesions or ulcers Psychiatry: Judgement and insight impaired. Mood & affect flat.   R leg anterior 10/05/23     R heel 10/05/23   R leg posterior 10/05/23   L leg anterior 10/15/23   L heel 10/05/23   L leg posterior 10/05/23  Data Reviewed:   I have personally reviewed following labs and imaging studies   CBC: Recent Labs  Lab 10/05/23 0550  WBC 11.2*  HGB 12.9*  HCT 40.2  MCV 98.5  PLT 183     Basic Metabolic Panel: Recent Labs  Lab 10/05/23 0550  NA 137  K 4.3  CL 96*  CO2 26  GLUCOSE 98  BUN 18  CREATININE 1.00  CALCIUM 9.4  MG 1.9     Liver Function Tests: Recent Labs  Lab 10/05/23 0550  AST 18  ALT 22  ALKPHOS 65  BILITOT 0.6  PROT 6.3*  ALBUMIN 2.9*    CBG: No results for input(s): "GLUCAP" in the last 168 hours.  Microbiology Studies:  No results found for this or any previous visit (from the past 240 hours).  Radiology Studies:  No results found.  Scheduled Meds:    ascorbic acid  250 mg Oral BID   aspirin EC  81 mg Oral Daily   bisacodyl  10 mg Rectal Once   enoxaparin (LOVENOX) injection  40 mg Subcutaneous  Q24H   escitalopram  5 mg Oral Daily   feeding supplement  237 mL Oral BID BM   multivitamin with minerals  1 tablet Oral Daily   polyethylene glycol  17 g Oral BID   rosuvastatin  20 mg Oral Daily   senna-docusate  2 tablet Oral BID   sodium chloride flush  3 mL Intravenous Q12H    Continuous Infusions:     LOS: 25 days     Marcellus Scott, MD,  FACP, [facility], [facility], [facility]   [facility]  To contact the attending provider between 7A-7P or the covering provider during after hours 7P-7A, please log into the web site [email or web address] and access using universal [place] password for that web site. If you do not have the password, please call the hospital operator.  10/06/2023, 4:27 PM

## 2023-10-06 NOTE — Progress Notes (Signed)
Physical Therapy Treatment Patient Details Name: Jack [name] MRN: [id number] DOB: 08/11/1946 Today's Date: 10/06/2023   History of Present Illness 78 y.o. male presents to [facility] from [facility] 09/11/23 for vascular surgery consult/intervention for B LE. Pt admitted w/ deep B heel ulcers w/ no evidence of acute infection. Lower extremity wounds being managed conservatively with no plans for surgical intervention. PMHx: HTN, CA s/p CABG, HLD    PT Comments  Pt received in supine, agreeable to therapy session with encouragement, pt receptive to instruction but with decreased attention to task and needs frequent reminders for plan and why PTA is working with him. PTA is familiar with this patient from previous week sessions, and pt demonstrates improved technique with bed mobility and slide board transfers this date compared with previous sessions. Pt needing up to minA with dense sequencing/safety cues for scoot from air bed<>bari drop arm BSC and back. Pt unaware of bowel incontinence which was observed once pt returned to supine, but pt defers to be placed on bed pan so new pad put under his hips and RN aware he will need clean-up once he is done with BM. Pt will continue to benefit from skilled rehab in a post acute setting < 3 hours per day to maximize functional gains before returning home, pt is progressing toward his goals and no longer needing +2 assist for mobility tasks as long as he has proper DME. Plan to work on standing/squat pivot transfers next session pending pain/activity tolerance.     If plan is discharge home, recommend the following: A lot of help with bathing/dressing/bathroom;Assistance with cooking/housework;Assist for transportation;Help with stairs or ramp for entrance;Two people to help with walking and/or transfers;Supervision due to cognitive status;Direct supervision/assist for financial management;Direct supervision/assist for medications management   Can travel by private vehicle      No  Equipment Recommendations  Hospital bed;Hoyer lift;Other (comment);Wheelchair (measurements PT);Wheelchair cushion (measurements PT) (slide board, bariatric drop arm BSC and WC with removable arm and leg rests)    Recommendations for Other Services       Precautions / Restrictions Precautions Precautions: Fall Recall of Precautions/Restrictions: Impaired Precaution/Restrictions Comments: Contact precs; B LE wounds, eschar on B heels Required Braces or Orthoses: Other Brace Other Brace: prevalon boots; float heels; post-op shoe in his room for LLE Restrictions Weight Bearing Restrictions Per Provider Order: No Other Position/Activity Restrictions: still full WB allowed per Dr Alvino Chapel 1/23     Mobility  Bed Mobility Overal bed mobility: Needs Assistance Bed Mobility: Rolling, Sidelying to Sit, Sit to Sidelying Rolling: Used rails, Contact guard assist Sidelying to sit: Min assist, Used rails     Sit to sidelying: Min assist, Used rails General bed mobility comments: assistance with trunk lifting to sit up and with BLEs to return to supine    Transfers Overall transfer level: Needs assistance Equipment used: Sliding board Transfers: Bed to chair/wheelchair/BSC            Lateral/Scoot Transfers: With slide board, Min assist, From elevated surface General transfer comment: EOB<> bariatric drop arm BSC with slide board, mod safety cues and increased time to perform; pt defers need for toileting once on BSC, and returned back to bed, but then bed pad noted to be soiled, RN notified and pt offered bed pan, but he defers, new bed pad placed and RN aware he will need clean-up once done with BM.    Ambulation/Gait  Stairs             Wheelchair Mobility     Tilt Bed    Modified Rankin (Stroke Patients Only)       Balance Overall balance assessment: Needs assistance Sitting-balance support: Feet supported, Single extremity  supported, No upper extremity supported Sitting balance-Leahy Scale: Good Sitting balance - Comments: single UE support at EOB                                    Communication Communication Communication: No apparent difficulties  Cognition Arousal: Alert Behavior During Therapy: WFL for tasks assessed/performed                             Following commands: Intact      Cueing Cueing Techniques: Verbal cues  Exercises      General Comments General comments (skin integrity, edema, etc.): No acute s/sx distress and pt able to sit up without c/o dizziness. Pt unaware of bowel incontinence.      Pertinent Vitals/Pain Pain Assessment Pain Assessment: Faces Faces Pain Scale: Hurts little more Pain Location: B LE's with movement Pain Descriptors / Indicators: Grimacing, Guarding     PT Goals (current goals can now be found in the care plan section) Acute Rehab PT Goals Patient Stated Goal: to go back home PT Goal Formulation: With patient Time For Goal Achievement: 10/15/23 Progress towards PT goals: Progressing toward goals    Frequency    Min 1X/week      PT Plan         AM-PAC PT "6 Clicks" Mobility   Outcome Measure  Help needed turning from your back to your side while in a flat bed without using bedrails?: A Little Help needed moving from lying on your back to sitting on the side of a flat bed without using bedrails?: A Little Help needed moving to and from a bed to a chair (including a wheelchair)?: A Little (with slide board) Help needed standing up from a chair using your arms (e.g., wheelchair or bedside chair)?: Total Help needed to walk in hospital room?: Total Help needed climbing 3-5 steps with a railing? : Total 6 Click Score: 12    End of Session Equipment Utilized During Treatment: Gait belt;Other (comment) (LLE post-op shoe) Activity Tolerance: Patient tolerated treatment well Patient left: in bed;with call  bell/phone within reach;with bed alarm set Nurse Communication: Mobility status;Other (comment) (+1 SB tf to BSC/drop arm chair, pt needs hygiene assist but did not appear to be done with BM when PTA left his room) PT Visit Diagnosis: Other abnormalities of gait and mobility (R26.89);Muscle weakness (generalized) (M62.81);Difficulty in walking, not elsewhere classified (R26.2);Adult, failure to thrive (R62.7)     Time: 1710-1740 PT Time Calculation (min) (ACUTE ONLY): 30 min  Charges:    $Therapeutic Activity: 23-37 mins PT General Charges $$ ACUTE PT VISIT: 1 Visit                     Darrold Bezek P., PTA Acute Rehabilitation Services Secure Chat Preferred 9a-5:30pm Office: [phone]    Dorathy Kinsman [facility] 10/06/2023, 7:00 PM

## 2023-10-06 NOTE — Progress Notes (Signed)
Occupational Therapy Treatment Patient Details Name: Jack [name] MRN: [id number] DOB: 21-Oct-1945 Today's Date: 10/06/2023   History of present illness 78 y.o. male presents to [facility] from [facility] 09/11/23 for vascular surgery consult/intervention for B LE. Pt admitted w/ deep B heel ulcers w/ no evidence of acute infection. Lower extremity wounds being managed conservatively with no plans for surgical intervention. PMHx: HTN, CA s/p CABG, HLD   OT comments  Patient received in supine and declined getting out of bed but agreeable to EOB. Therapist instructed patient that he needed to get OOB more and he stated will attempt later today. Patient was CGA to get to EOB and was able to perform grooming and bathing tasks seated on EOB with setup for UB and patient able to wash legs down to dressing seated. Patient returned to supine with min assist. skilled nursing facility. Acute OT to continue to follow to address established goals to facilitate DC to next venue of care.       If plan is discharge home, recommend the following:  Two people to help with walking and/or transfers;Two people to help with bathing/dressing/bathroom;Assistance with cooking/housework;Assist for transportation;Help with stairs or ramp for entrance;Direct supervision/assist for financial management;Direct supervision/assist for medications management   Equipment Recommendations  Other (comment) (defer to next venue of care)    Recommendations for Other Services      Precautions / Restrictions Precautions Precautions: Fall Precaution/Restrictions Comments: Contact precs; B LE wounds, eschar on B heels Required Braces or Orthoses: Other Brace Other Brace: prevalon boots; float heels Restrictions Weight Bearing Restrictions Per Provider Order: No Other Position/Activity Restrictions: still full WB allowed per Dr Alvino Chapel 1/23       Mobility Bed Mobility Overal bed mobility: Needs Assistance Bed Mobility: Supine to Sit, Sit  to Supine, Rolling Rolling: Min assist, Used rails   Supine to sit: Used rails, HOB elevated, Contact guard Sit to supine: Min assist   General bed mobility comments: assistance with BLEs to return to supine    Transfers Overall transfer level: Needs assistance                 General transfer comment: declined OOB     Balance Overall balance assessment: Needs assistance Sitting-balance support: Feet supported, No upper extremity supported Sitting balance-Leahy Scale: Good Sitting balance - Comments: performed self care seated on EOB                                   ADL either performed or assessed with clinical judgement   ADL Overall ADL's : Needs assistance/impaired     Grooming: Wash/dry face;Set up;Sitting Grooming Details (indicate cue type and reason): EOB Upper Body Bathing: Set up;Sitting Upper Body Bathing Details (indicate cue type and reason): EOB Lower Body Bathing: Moderate assistance;Sitting/lateral leans Lower Body Bathing Details (indicate cue type and reason): able to bathe legs seated on EOB down to dressings, required max assist for peri area bottom     Lower Body Dressing: Total assistance                 General ADL Comments: declining OOB and performed self care tasks seated on EOB    Extremity/Trunk Assessment Upper Extremity Assessment Upper Extremity Assessment: Generalized weakness            Vision       Perception     Praxis     Communication Communication Communication:  No apparent difficulties   Cognition Arousal: Alert Behavior During Therapy: WFL for tasks assessed/performed                                 Following commands: Intact        Cueing   Cueing Techniques: Verbal cues  Exercises      Shoulder Instructions       General Comments Patient with complaints of dizziness once on EOB with BP 144/77    Pertinent Vitals/ Pain       Pain Assessment Pain  Assessment: Faces Faces Pain Scale: Hurts little more Pain Location: B LE's with movement Pain Descriptors / Indicators: Grimacing, Guarding Pain Intervention(s): Limited activity within patient's tolerance, Monitored during session, Repositioned  Home Living                                          Prior Functioning/Environment              Frequency  Min 1X/week        Progress Toward Goals  OT Goals(current goals can now be found in the care plan section)  Progress towards OT goals: Progressing toward goals  Acute Rehab OT Goals Patient Stated Goal: have a bowel movement OT Goal Formulation: With patient Time For Goal Achievement: 10/13/23 Potential to Achieve Goals: Fair ADL Goals Pt Will Perform Eating: with supervision;sitting Pt Will Perform Grooming: with supervision;sitting Pt Will Perform Upper Body Bathing: with set-up;sitting Pt Will Perform Lower Body Bathing: with min assist;sitting/lateral leans Pt Will Transfer to Toilet: with mod assist;with +2 assist;stand pivot transfer Additional ADL Goal #1: Pt will perform bed mobility with supervision A as precursor to ADL  Plan      Co-evaluation                 AM-PAC OT "6 Clicks" Daily Activity     Outcome Measure   Help from another person eating meals?: A Little Help from another person taking care of personal grooming?: A Little Help from another person toileting, which includes using toliet, bedpan, or urinal?: Total Help from another person bathing (including washing, rinsing, drying)?: A Lot Help from another person to put on and taking off regular upper body clothing?: A Little Help from another person to put on and taking off regular lower body clothing?: Total 6 Click Score: 13    End of Session    OT Visit Diagnosis: Muscle weakness (generalized) (M62.81);Other symptoms and signs involving cognitive function;Other (comment) (decreased activity tolerance)    Activity Tolerance Patient tolerated treatment well   Patient Left in bed;with call bell/phone within reach;with bed alarm set   Nurse Communication Mobility status        Time: 4098-1191 OT Time Calculation (min): 32 min  Charges: OT General Charges $OT Visit: 1 Visit OT Treatments $Self Care/Home Management : 23-37 mins  Alfonse Flavors, OTA Acute Rehabilitation Services  Office [phone]   Dewain Penning 10/06/2023, 11:09 AM

## 2023-10-07 DIAGNOSIS — N1831 Chronic kidney disease, stage 3a: Secondary | ICD-10-CM | POA: Diagnosis not present

## 2023-10-07 DIAGNOSIS — L97822 Non-pressure chronic ulcer of other part of left lower leg with fat layer exposed: Secondary | ICD-10-CM | POA: Diagnosis not present

## 2023-10-07 DIAGNOSIS — I70234 Atherosclerosis of native arteries of right leg with ulceration of heel and midfoot: Secondary | ICD-10-CM | POA: Diagnosis not present

## 2023-10-07 DIAGNOSIS — I70244 Atherosclerosis of native arteries of left leg with ulceration of heel and midfoot: Secondary | ICD-10-CM | POA: Diagnosis not present

## 2023-10-07 DIAGNOSIS — I2510 Atherosclerotic heart disease of native coronary artery without angina pectoris: Secondary | ICD-10-CM | POA: Diagnosis not present

## 2023-10-07 DIAGNOSIS — M19011 Primary osteoarthritis, right shoulder: Secondary | ICD-10-CM | POA: Diagnosis not present

## 2023-10-07 DIAGNOSIS — I70223 Atherosclerosis of native arteries of extremities with rest pain, bilateral legs: Secondary | ICD-10-CM | POA: Diagnosis not present

## 2023-10-07 DIAGNOSIS — L97412 Non-pressure chronic ulcer of right heel and midfoot with fat layer exposed: Secondary | ICD-10-CM | POA: Diagnosis not present

## 2023-10-07 DIAGNOSIS — R2689 Other abnormalities of gait and mobility: Secondary | ICD-10-CM | POA: Diagnosis not present

## 2023-10-07 DIAGNOSIS — R41841 Cognitive communication deficit: Secondary | ICD-10-CM | POA: Diagnosis not present

## 2023-10-07 DIAGNOSIS — R4689 Other symptoms and signs involving appearance and behavior: Secondary | ICD-10-CM | POA: Diagnosis not present

## 2023-10-07 DIAGNOSIS — M6281 Muscle weakness (generalized): Secondary | ICD-10-CM | POA: Diagnosis not present

## 2023-10-07 DIAGNOSIS — Z743 Need for continuous supervision: Secondary | ICD-10-CM | POA: Diagnosis not present

## 2023-10-07 DIAGNOSIS — R278 Other lack of coordination: Secondary | ICD-10-CM | POA: Diagnosis not present

## 2023-10-07 DIAGNOSIS — R1312 Dysphagia, oropharyngeal phase: Secondary | ICD-10-CM | POA: Diagnosis not present

## 2023-10-07 DIAGNOSIS — R0989 Other specified symptoms and signs involving the circulatory and respiratory systems: Secondary | ICD-10-CM | POA: Diagnosis not present

## 2023-10-07 DIAGNOSIS — Z961 Presence of intraocular lens: Secondary | ICD-10-CM | POA: Diagnosis not present

## 2023-10-07 DIAGNOSIS — M170 Bilateral primary osteoarthritis of knee: Secondary | ICD-10-CM | POA: Diagnosis not present

## 2023-10-07 DIAGNOSIS — I739 Peripheral vascular disease, unspecified: Secondary | ICD-10-CM | POA: Diagnosis not present

## 2023-10-07 DIAGNOSIS — L97422 Non-pressure chronic ulcer of left heel and midfoot with fat layer exposed: Secondary | ICD-10-CM | POA: Diagnosis not present

## 2023-10-07 DIAGNOSIS — R531 Weakness: Secondary | ICD-10-CM | POA: Diagnosis not present

## 2023-10-07 DIAGNOSIS — I70235 Atherosclerosis of native arteries of right leg with ulceration of other part of foot: Secondary | ICD-10-CM | POA: Diagnosis not present

## 2023-10-07 DIAGNOSIS — L97519 Non-pressure chronic ulcer of other part of right foot with unspecified severity: Secondary | ICD-10-CM | POA: Diagnosis not present

## 2023-10-07 DIAGNOSIS — R5381 Other malaise: Secondary | ICD-10-CM | POA: Diagnosis not present

## 2023-10-07 DIAGNOSIS — I70248 Atherosclerosis of native arteries of left leg with ulceration of other part of lower left leg: Secondary | ICD-10-CM | POA: Diagnosis not present

## 2023-10-07 DIAGNOSIS — L97919 Non-pressure chronic ulcer of unspecified part of right lower leg with unspecified severity: Secondary | ICD-10-CM | POA: Diagnosis not present

## 2023-10-07 DIAGNOSIS — L97929 Non-pressure chronic ulcer of unspecified part of left lower leg with unspecified severity: Secondary | ICD-10-CM | POA: Diagnosis not present

## 2023-10-07 DIAGNOSIS — H524 Presbyopia: Secondary | ICD-10-CM | POA: Diagnosis not present

## 2023-10-07 MED ORDER — ADULT MULTIVITAMIN W/MINERALS CH
1.0000 | ORAL_TABLET | Freq: Every day | ORAL | Status: AC
Start: 2023-10-08 — End: ?

## 2023-10-07 MED ORDER — POLYETHYLENE GLYCOL 3350 17 G PO PACK: 17.0000 g | PACK | Freq: Two times a day (BID) | ORAL | Status: AC

## 2023-10-07 NOTE — Hospital Course (Signed)
Jack [name] is a 78 y.o. male with a history of hypertension, CAD s/p CABG, hyperlipidemia.  Patient presented secondary to foot ulcer secondary to PAD. Patient was managed on antibiotics. Vascular surgery consulted with recommendation for bilateral amputation, which was decline. Plan to discharge to SNF.

## 2023-10-07 NOTE — Discharge Summary (Signed)
 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 *  Hospital Course: Jack [name] is a 78 y.o. male with a history of hypertension, CAD s/p CABG, hyperlipidemia.  Patient presented secondary to foot ulcer secondary to PAD. Patient was managed on antibiotics. Vascular surgery consulted with recommendation for bilateral amputation, which was decline. Plan to discharge to SNF.  Assessment and Plan:  Bilateral lower extremity ischemic ulcerations Patient was managed briefly with vancomycin, ceftriaxone and Flagyl. Patient's wounds managed with wound care. Wound are secondary to severe PAD, not amenable to endovascular options and bilateral amputations declined by patient/family. Palliative care consulted this admission to help with goals of care. Plan to continue wound care, discharge to SNF for rehabilitation with eventual plan for patient to live with family. Wound care instructions: Paint LE wounds/heels with betadine, allow to air dry, top with foam  Cover ope wound right LE with single layer of xeroform, top with foam   PAD Severe PAD noted as identified by abnormal ABIS of right/left leg measuring 0.37 and 0.32 respectively, in addition to angiogram (1/24) which was significant for bilateral flush SFA occlusion with common femoral disease and no endovascular option. Palliative amputation  offered but declined. Continue analgesic therapy for symptomatic management.  Acute delirium CT head without acute abnormality, but was significant for significant parenchymal atrophy and chronic small vessel ischemic disease.  Possible dementia Concern during this admission; CT head could suggest vascular related dementia. Consider neurology follow-up pending ongoing goals of care.  Vitamin B12 deficiency Vitamin B12 low-normal at 207. Patient started on vitamin B12 supplementation. Continue on discharge.  Normocytic anemia Mild. Stable.  Primary hypertension Noted. Mostly normotensive. No antihypertensives prescribed.  Hyperlipidemia Continue Crestor  Constipation Continue Senokot-S and MiraLAX  Moderate malnutrition Continue multivitamin and protein supplementation.  Obesity Estimated body mass index is 34.48 kg/m as calculated from the following:   Height as of this encounter: 5\' 10"  (1.778 m).   Weight as of this encounter: 109 kg.  Pressure injury Right heel, left buttock, anterior/mid sacrum. Present on admission.   Consultants: Vascular surgery, palliative care Procedures performed:  Vascular surgery: Ultrasound-guided access right common femoral artery Aortogram with catheter selection of abdominal aorta Left lower extremity arteriogram with catheter selection of the left common femoral Right lower extremity arteriogram with catheter runoff from the right common femoral sheath  Disposition: Skilled nursing facility Diet recommendation: Regular diet   DISCHARGE MEDICATION: Allergies as of 10/07/2023   No Known Allergies      Medication List     TAKE these medications    acetaminophen 325 MG tablet Commonly known as: TYLENOL Take 2 tablets (650 mg total) by mouth every 6 (six) hours as needed for mild pain (pain score 1-3) (or Fever >/= 101).   ascorbic acid 250 MG tablet Commonly known as: VITAMIN C Take 1 tablet (250 mg total) by  mouth 2 (two)  times daily.   aspirin EC 81 MG tablet Take 1 tablet (81 mg total) by mouth daily. Swallow whole.   escitalopram 5 MG tablet Commonly known as: LEXAPRO Take 1 tablet (5 mg total) by mouth daily.   feeding supplement Liqd Take 237 mLs by mouth 2 (two) times daily between meals.   multivitamin with minerals Tabs tablet Take 1 tablet by mouth daily. Start taking on: October 08, 2023   oxyCODONE-acetaminophen 5-325 MG tablet Commonly known as: PERCOCET/ROXICET Take 1 tablet by mouth every 4 (four) hours as needed for moderate pain (pain score 4-6).   polyethylene glycol 17 g packet Commonly known as: MIRALAX / GLYCOLAX Take 17 g by mouth 2 (two) times daily.   rosuvastatin 20 MG tablet Commonly known as: CRESTOR Take 1 tablet (20 mg total) by mouth daily.   senna-docusate 8.6-50 MG tablet Commonly known as: Senokot-S Take 2 tablets by mouth at bedtime.               Discharge Care Instructions  (From admission, onward)           Start     Ordered   09/24/23 0000  Discharge wound care:       Comments: Wound care  Daily      Comments: Paint LE wounds/heels with betadine, allow to air dry, top with foam Cover open wound right LE with single layer of xeroform, top with foam   09/24/23 1041            Follow-up Information     [name], MD Follow up.   Specialty: Vascular Surgery Why: If symptoms worsen Contact information: [address] [place] [address] [phone]                Discharge Exam: BP 129/72 (BP Location: Left Arm)   Pulse 81   Temp 97.9 F (36.6 C) (Oral)   Resp 18   Ht 5\' 10"  (1.778 m)   Wt 109 kg   SpO2 94%   BMI 34.48 kg/m   General exam: Appears calm and comfortable Respiratory system: Clear to auscultation. Respiratory effort normal. Cardiovascular system: S1 & S2 heard, RRR. No murmurs, rubs, gallops or clicks. Gastrointestinal system: Abdomen is nondistended, soft and nontender. Normal bowel  sounds heard. Central nervous system: Alert and oriented. Musculoskeletal: No edema. Leg dressings noted   Condition at discharge: stable  The results of significant diagnostics from this hospitalization (including imaging, microbiology, ancillary and laboratory) are listed below for reference.   Imaging Studies: VAS [place] LOWER EXTREMITY SAPHENOUS VEIN MAPPING Result Date: 09/21/2023 LOWER EXTREMITY VEIN MAPPING Patient Name:  STRYDER POITRA  Date of Exam:   09/18/2023 Medical Rec #: [id number]      Accession #:    [id number] Date of Birth: 30-May-1946      Patient Gender: M Patient Age:   35 years Exam Location:  [facility] Procedure:      VAS [place] LOWER EXTREMITY SAPHENOUS VEIN MAPPING Referring Phys: Sherald Hess --------------------------------------------------------------------------------  Indications: Pre-op History:     History of PAD; patient is pre-operative for lower extremity bypass              graft.  Limitations: [place]ges and wounds of bilateral calf & ankle. Comparison Study: No previous exams Performing Technologist: Jody Hill RVT, RDMS  Examination Guidelines: A complete evaluation includes B-mode imaging, spectral Doppler, color Doppler, and power Doppler as needed of all accessible portions of each  vessel. Bilateral testing is considered an integral part of a complete examination. Limited examinations for reoccurring indications may be performed as noted. +------------+--------------+----------------------+------------+--------------+ RT Diameter  RT Findings           GSV          LT Diameter  LT Findings       (cm)                                            (cm)                   +------------+--------------+----------------------+------------+--------------+     0.68      branching       Saphenofemoral        0.45                                                    Junction                                   +------------+--------------+----------------------+------------+--------------+     0.63                      Proximal thigh        0.47                   +------------+--------------+----------------------+------------+--------------+     0.53      branching         Mid thigh           0.33      branching    +------------+--------------+----------------------+------------+--------------+     0.58                       Distal thigh         0.14                   +------------+--------------+----------------------+------------+--------------+     0.51                           Knee                     not visualized +------------+--------------+----------------------+------------+--------------+     0.48      branching         Prox calf           0.22                   +------------+--------------+----------------------+------------+--------------+     0.33      branching          Mid calf           0.40      branching    +------------+--------------+----------------------+------------+--------------+             not visualized     Distal calf                  not visualized +------------+--------------+----------------------+------------+--------------+             not visualized  Ankle                     not visualized +------------+--------------+----------------------+------------+--------------+ Diagnosing physician: Coral Else MD Electronically signed by Coral Else MD on 09/21/2023 at 5:42:42 PM.    Final    DG Foot Complete Right Result Date: 09/20/2023 CLINICAL DATA:  Osteomyelitis. EXAM: RIGHT FOOT COMPLETE - 3+ VIEW COMPARISON:  Radiograph 09/03/2023 FINDINGS: Hammertoe deformity of the toes. Bony under mineralization. Slightly decreased density involving the plantar calcaneus with likely overlying wound. No frank erosive changes. Midfoot osteoarthritis with dorsal spurring. No soft tissue gas or radiopaque foreign body IMPRESSION: Slightly  decreased density involving the plantar calcaneus suspicious for osteomyelitis. Electronically Signed   By: Narda Rutherford M.D.   On: 09/20/2023 15:33   PERIPHERAL VASCULAR CATHETERIZATION Result Date: 09/18/2023 Images from the original result were not included.   Patient name: DEANDREW HOECKER           MRN: [id number]        DOB: 09-23-1945          Sex: male  09/18/2023 Pre-operative Diagnosis: Critical limb ischemia of bilateral lower extremities with bilateral heel wounds Post-operative diagnosis:  Same Surgeon:  [name], MD Procedure Performed: 1.  Ultrasound-guided access right common femoral artery 2.  Aortogram with catheter selection of abdominal aorta 3.  Left lower extremity arteriogram with catheter selection of the left common femoral 4.  Right lower extremity arteriogram with catheter runoff from the right common femoral sheath  Indications: 78 year old male transferred from [facility] for evaluation of bilateral heel ulcers.  Previously discussed with family that I think he needs bilateral above-knee amputation versus palliative wound care.  He presents today for aortogram, lower extremity arteriogram after risk benefits discussed.  Findings:  Ultrasound-guided access right common femoral artery.  Diseased abdominal aorta with small aneurysm.   On the left he has a high-grade iliac stenosis at the junction of the common iliac and external iliac artery over 80%.  The left common femoral has a high-grade stenosis >80% and is calcified.  The profundus patent.  He has a flush SFA occlusion.  The SFA as well as the above and below-knee popliteal artery are all occluded.  He reconstitutes a tibial trifurcation.  AT is the dominant runoff into the foot and is widely patent.  The posterior tibial is also patent but has a high-grade stenosis in the distal calf.  On the right has significantly calcified iliac arteries.  He also has significant common femoral disease >60% with a patent profunda  and flush SFA occlusion.  SFA is occluded with occluded above and below-knee popliteal artery.  AT reconstitutes and is the dominant runoff into the foot.             Procedure:  The patient was identified in the holding area and taken to room 8.  The patient was then placed supine on the table and prepped and draped in the usual sterile fashion.  A time out was called.  Ultrasound was used to evaluate the right common femoral artery.  It was patent .  A digital ultrasound image was acquired.  A micropuncture needle was used to access the right common femoral artery under ultrasound guidance.  An 018 wire was advanced without resistance and a micropuncture sheath was placed.  The 018 wire was removed and a benson wire was placed.  The micropuncture sheath was exchanged for a 5 french sheath.  An omniflush catheter was  advanced over the wire to the level of L-1.  An abdominal angiogram was obtained.  I then went across the aortic bifurcation with an Omni Flush catheter and the wire would not advance.  I changed to a glidewire and crossing catheter..  The catheter was placed into theleft common femoral artery and left runoff was obtained.  We then removed wires and catheters and got right leg runoff from the sheath in the right groin.  Pertinent findings are noted above.  He has no endovascular options.  Plan: Bilateral flush SFA occlusion with common femoral disease and no endovascular option.  I reviewed his case with several of my partners.  Ultimately I think he needs a left above-knee amputation as the left heel eschar is extensive and I do not think the foot is salvageable.  If there is a foot to save it would be the right foot and this would require common femoral endarterectomy with femoral to anterior tibial bypass.  Even this would be high risk for no wound healing long-term.  Have discussed getting foot MRI to ensure no osteomyelitis in the heel.   [name], MD [facility] Office: [phone]   CT HEAD WO CONTRAST ( ) Result Date: 09/12/2023 CLINICAL DATA:  Provided history: Delirium. EXAM: CT HEAD WITHOUT CONTRAST TECHNIQUE: Contiguous axial images were obtained from the base of the skull through the vertex without intravenous contrast. RADIATION DOSE REDUCTION: This exam was performed according to the departmental dose-optimization program which includes automated exposure control, adjustment of the mA and/or kV according to patient size and/or use of iterative reconstruction technique. COMPARISON:  Report from head CT 08/15/2015 (images unavailable). FINDINGS: Brain: Generalized parenchymal atrophy. Prominence of the ventricles and sulci appears commensurate. Patchy and ill-defined hypoattenuation within the cerebral white matter, nonspecific but compatible with moderate chronic small vessel ischemic disease. There is no acute intracranial hemorrhage. No demarcated cortical infarct. No extra-axial fluid collection. No evidence of an intracranial mass. No midline shift. Vascular: No hyperdense vessel.  Atherosclerotic calcifications. Skull: No calvarial fracture or aggressive osseous lesion. Sinuses/Orbits: No mass or acute finding within the imaged orbits. Minimal mucosal thickening within the right maxillary sinus at the imaged levels. Minimal mucosal thickening within the bilateral sphenoid and ethmoid sinuses IMPRESSION: 1. No evidence of an acute intracranial abnormality. 2. Parenchymal atrophy and chronic small vessel ischemic disease. 3. Minor paranasal sinus mucosal thickening at the imaged levels. Electronically Signed   By: Jackey Loge D.O.   On: 09/12/2023 17:12   VAS [place] ABI WITH/WO TBI Result Date: 09/12/2023  LOWER EXTREMITY DOPPLER STUDY Patient Name:  DEVYN SHEERIN  Date of Exam:   09/12/2023 Medical Rec #: [id number]      Accession #:    [id number] Date of Birth: 10-30-1945      Patient Gender: M Patient Age:   63 years Exam Location:  [facility] Procedure:      VAS [place] ABI WITH/WO TBI Referring Phys: Sherald Hess --------------------------------------------------------------------------------  Indications: Rest pain, and ulceration. High Risk Factors: Hypertension, hyperlipidemia, current smoker, coronary artery                    disease. Other Factors: Non compliance with medication and medical care since death of                his wife in 2017.  Limitations: Today's exam was limited due to Multiple lines in left upper  extremity, skin texture, involuntary movement, and arrythmia Comparison Study: Prior study done at [facility] 09/07/23 Performing Technologist: Sherren Kerns RVS  Examination Guidelines: A complete evaluation includes at minimum, Doppler waveform signals and systolic blood pressure reading at the level of bilateral brachial, anterior tibial, and posterior tibial arteries, when vessel segments are accessible. Bilateral testing is considered an integral part of a complete examination. Photoelectric Plethysmograph (PPG) waveforms and toe systolic pressure readings are included as required and additional duplex testing as needed. Limited examinations for reoccurring indications may be performed as noted.  ABI Findings: +---------+------------------+-----+-------------------+--------+ Right    Rt Pressure (mmHg)IndexWaveform           Comment  +---------+------------------+-----+-------------------+--------+ Brachial 147                    multiphasic                 +---------+------------------+-----+-------------------+--------+ PTA      51                0.35 dampened monophasic         +---------+------------------+-----+-------------------+--------+ DP       54                0.37 dampened monophasic         +---------+------------------+-----+-------------------+--------+ Great Toe0                 0.00 Absent                       +---------+------------------+-----+-------------------+--------+ +---------+------------------+-----+-------------------+---------------------+ Left     Lt Pressure (mmHg)IndexWaveform           Comment               +---------+------------------+-----+-------------------+---------------------+ Brachial                        multiphasic        multiple lines in arm +---------+------------------+-----+-------------------+---------------------+ PTA      47                0.32 dampened monophasic                      +---------+------------------+-----+-------------------+---------------------+ DP       33                0.22 dampened monophasic                      +---------+------------------+-----+-------------------+---------------------+ Great Toe0                 0.00 Absent                                   +---------+------------------+-----+-------------------+---------------------+ +-------+-----------+-----------+------------+------------+ ABI/TBIToday's ABIToday's TBIPrevious ABIPrevious TBI +-------+-----------+-----------+------------+------------+ Right  0.37       absent     0.32                     +-------+-----------+-----------+------------+------------+ Left   0.32       absent     0.29                     +-------+-----------+-----------+------------+------------+ Bilateral ABIs appear essentially unchanged compared to prior study on 09/07/2023.  Summary: Right: Resting right ankle-brachial index indicates severe right lower extremity arterial disease. Left: Resting left  ankle-brachial index indicates severe left lower extremity arterial disease. *See table(s) above for measurements and observations.  Electronically signed by Sherald Hess MD on 09/12/2023 at 3:22:39 PM.    Final     Microbiology: Results for orders placed or performed during the hospital encounter of 09/11/23  Surgical PCR screen     Status: None   Collection Time:  09/12/23  3:34 AM   Specimen: Nasal Mucosa; Nasal Swab  Result Value Ref Range Status   MRSA, PCR NEGATIVE NEGATIVE Final   Staphylococcus aureus NEGATIVE NEGATIVE Final    Comment: (NOTE) The Xpert SA Assay (FDA approved for NASAL specimens in patients 45 years of age and older), is one component of a comprehensive surveillance program. It is not intended to diagnose infection nor to guide or monitor treatment. Performed at [facility], [address], [place], [place] [address]     Labs: CBC: Recent Labs  Lab 10/05/23 0550  WBC 11.2*  HGB 12.9*  HCT 40.2  MCV 98.5  PLT 183   Basic Metabolic Panel: Recent Labs  Lab 10/05/23 0550  NA 137  K 4.3  CL 96*  CO2 26  GLUCOSE 98  BUN 18  CREATININE 1.00  CALCIUM 9.4  MG 1.9   Liver Function Tests: Recent Labs  Lab 10/05/23 0550  AST 18  ALT 22  ALKPHOS 65  BILITOT 0.6  PROT 6.3*  ALBUMIN 2.9*    Discharge time spent: 35 minutes.  Signed: Jacquelin Hawking, MD [facility] 10/07/2023

## 2023-10-07 NOTE — TOC Progression Note (Addendum)
Transition of Care [facility]) - Progression Note    Patient Details  Name: Jack [name] MRN: [id number] Date of Birth: March 01, 1946  [facility] CM/SW Contact  Lorri Frederick, LCSW Phone Number: 10/07/2023, 12:11 PM  Clinical Narrative:   SNF auth now showing as approved in [place]: [id number], 15 days: 1/31-2/14.  CSW message with Shanna[facility] who confirmed they can receive pt today but the bed is in the "4 bed suite"  MD informed.  Son Caryn Bee updated.   Expected Discharge Plan: Skilled Nursing Facility Barriers to Discharge: Continued Medical Work up  Expected Discharge Plan and Services                                               Social Determinants of Health (SDOH) Interventions SDOH Screenings   Food Insecurity: No Food Insecurity (09/11/2023)  Housing: Low Risk  (09/11/2023)  Transportation Needs: No Transportation Needs (09/11/2023)  Utilities: Not At Risk (09/11/2023)  Alcohol Screen: Low Risk  (02/02/2018)  Depression (PHQ2-9): Low Risk  (04/10/2020)  Financial Resource Strain: Low Risk  (09/04/2023)   Received from [facility]  Social Connections: Socially Isolated (09/11/2023)  Tobacco Use: High Risk (09/22/2023)    Readmission Risk Interventions     No data to display

## 2023-10-07 NOTE — TOC Transition Note (Signed)
Transition of Care [facility]) - Discharge Note   Patient Details  Name: Jack [name] MRN: [id number] Date of Birth: 1946-06-13  Transition of Care [facility]) CM/SW Contact:  Lorri Frederick, LCSW Phone Number: 10/07/2023, 2:57 PM   Clinical Narrative:   Pt discharging to [facility].  RN call report to [phone].     Final next level of care: Skilled Nursing Facility Barriers to Discharge: Barriers Resolved   Patient Goals and CMS Choice            Discharge Placement              Patient chooses bed at:  [facility]) Patient to be transferred to facility by: ptar Name of family member notified: son Caryn Bee Patient and family notified of of transfer: 10/07/23  Discharge Plan and Services Additional resources added to the After Visit Summary for                                       Social Drivers of Health (SDOH) Interventions SDOH Screenings   Food Insecurity: No Food Insecurity (09/11/2023)  Housing: Low Risk  (09/11/2023)  Transportation Needs: No Transportation Needs (09/11/2023)  Utilities: Not At Risk (09/11/2023)  Alcohol Screen: Low Risk  (02/02/2018)  Depression (PHQ2-9): Low Risk  (04/10/2020)  Financial Resource Strain: Low Risk  (09/04/2023)   Received from [facility]  Social Connections: Socially Isolated (09/11/2023)  Tobacco Use: High Risk (09/22/2023)     Readmission Risk Interventions     No data to display

## 2023-10-07 NOTE — Progress Notes (Signed)
Report given to [facility] at [facility].

## 2023-10-12 DIAGNOSIS — R5381 Other malaise: Secondary | ICD-10-CM | POA: Diagnosis not present

## 2023-10-12 DIAGNOSIS — L97929 Non-pressure chronic ulcer of unspecified part of left lower leg with unspecified severity: Secondary | ICD-10-CM | POA: Diagnosis not present

## 2023-10-12 DIAGNOSIS — L97919 Non-pressure chronic ulcer of unspecified part of right lower leg with unspecified severity: Secondary | ICD-10-CM | POA: Diagnosis not present

## 2023-10-13 DIAGNOSIS — L97919 Non-pressure chronic ulcer of unspecified part of right lower leg with unspecified severity: Secondary | ICD-10-CM | POA: Diagnosis not present

## 2023-10-13 DIAGNOSIS — L97929 Non-pressure chronic ulcer of unspecified part of left lower leg with unspecified severity: Secondary | ICD-10-CM | POA: Diagnosis not present

## 2023-10-13 DIAGNOSIS — R5381 Other malaise: Secondary | ICD-10-CM | POA: Diagnosis not present

## 2023-10-13 DIAGNOSIS — R1312 Dysphagia, oropharyngeal phase: Secondary | ICD-10-CM | POA: Diagnosis not present

## 2023-10-13 DIAGNOSIS — L97519 Non-pressure chronic ulcer of other part of right foot with unspecified severity: Secondary | ICD-10-CM | POA: Diagnosis not present

## 2023-10-13 DIAGNOSIS — R41841 Cognitive communication deficit: Secondary | ICD-10-CM | POA: Diagnosis not present

## 2023-10-13 DIAGNOSIS — I70235 Atherosclerosis of native arteries of right leg with ulceration of other part of foot: Secondary | ICD-10-CM | POA: Diagnosis not present

## 2023-10-13 DIAGNOSIS — M170 Bilateral primary osteoarthritis of knee: Secondary | ICD-10-CM | POA: Diagnosis not present

## 2023-10-13 DIAGNOSIS — I2510 Atherosclerotic heart disease of native coronary artery without angina pectoris: Secondary | ICD-10-CM | POA: Diagnosis not present

## 2023-10-21 DIAGNOSIS — L97422 Non-pressure chronic ulcer of left heel and midfoot with fat layer exposed: Secondary | ICD-10-CM | POA: Diagnosis not present

## 2023-10-21 DIAGNOSIS — Z961 Presence of intraocular lens: Secondary | ICD-10-CM | POA: Diagnosis not present

## 2023-10-21 DIAGNOSIS — N1831 Chronic kidney disease, stage 3a: Secondary | ICD-10-CM | POA: Diagnosis not present

## 2023-10-21 DIAGNOSIS — H524 Presbyopia: Secondary | ICD-10-CM | POA: Diagnosis not present

## 2023-10-21 DIAGNOSIS — I70244 Atherosclerosis of native arteries of left leg with ulceration of heel and midfoot: Secondary | ICD-10-CM | POA: Diagnosis not present

## 2023-10-21 DIAGNOSIS — I739 Peripheral vascular disease, unspecified: Secondary | ICD-10-CM | POA: Diagnosis not present

## 2023-10-28 DIAGNOSIS — L97412 Non-pressure chronic ulcer of right heel and midfoot with fat layer exposed: Secondary | ICD-10-CM | POA: Diagnosis not present

## 2023-10-28 DIAGNOSIS — I70248 Atherosclerosis of native arteries of left leg with ulceration of other part of lower left leg: Secondary | ICD-10-CM | POA: Diagnosis not present

## 2023-10-28 DIAGNOSIS — N1831 Chronic kidney disease, stage 3a: Secondary | ICD-10-CM | POA: Diagnosis not present

## 2023-10-28 DIAGNOSIS — I739 Peripheral vascular disease, unspecified: Secondary | ICD-10-CM | POA: Diagnosis not present

## 2023-10-28 DIAGNOSIS — I70234 Atherosclerosis of native arteries of right leg with ulceration of heel and midfoot: Secondary | ICD-10-CM | POA: Diagnosis not present

## 2023-10-28 DIAGNOSIS — L97822 Non-pressure chronic ulcer of other part of left lower leg with fat layer exposed: Secondary | ICD-10-CM | POA: Diagnosis not present

## 2023-11-03 ENCOUNTER — Ambulatory Visit (INDEPENDENT_AMBULATORY_CARE_PROVIDER_SITE_OTHER): Payer: Medicare HMO | Admitting: Vascular Surgery

## 2023-11-03 ENCOUNTER — Encounter: Payer: Self-pay | Admitting: Vascular Surgery

## 2023-11-03 VITALS — BP 127/62 | HR 83 | Temp 98.1°F | Resp 22 | Ht 70.0 in | Wt 240.0 lb

## 2023-11-03 DIAGNOSIS — I70223 Atherosclerosis of native arteries of extremities with rest pain, bilateral legs: Secondary | ICD-10-CM | POA: Diagnosis not present

## 2023-11-03 NOTE — Progress Notes (Signed)
 Patient name: Jack [name] MRN: [id number] DOB: August 01, 1946 Sex: male  REASON FOR CONSULT: Hospital follow-up  HPI: Jack [name] is a 78 y.o. male, with multiple comorbidities that was seen in the hospital for consultation on 09/12/2023 for bilateral lower extremity tissue loss with large heel eschar on bilateral lower extremities.  Ultimately underwent angiogram showing bilateral flush SFA occlusions and would require tibial bypass.  He has limited to no mobility.  He was offered bilateral above-knee amputations versus palliative wound care in the hospital.  Ultimately elected for palliative wound care.  Now in a facility in [place].  Today in a wheelchair.  Facility states he was transferred with a Michiel Sites lift to get here today.  Not ambulating according to the facility.  Past Medical History:  Diagnosis Date   Bradycardia    Coronary atherosclerosis of native coronary artery    Multivessel status post CABG   History of kidney stones    Hyperlipidemia    Hypertension    Peripheral vascular disease (HCC)    Tobacco user     Past Surgical History:  Procedure Laterality Date   ABDOMINAL AORTOGRAM W/LOWER EXTREMITY Bilateral 09/18/2023   Procedure: ABDOMINAL AORTOGRAM W/LOWER EXTREMITY;  Surgeon: [name], MD;  [facility];  Service: Cardiovascular;  Laterality: Bilateral;   APPENDECTOMY     age 55   CHOLECYSTECTOMY  11-22-2001   COLONOSCOPY N/A 04/21/2018   Procedure: COLONOSCOPY;  Surgeon: Malissa Hippo, MD;  [facility]: [facility];  Service: Endoscopy;  Laterality: N/A;  12:00-rescheduled to 8/28 @ 10:30am per Dewayne Hatch   CORONARY ARTERY BYPASS GRAFT  11/23/06   LIMA to LAD, SVG to ramus, SVG to PDA and PLA   EXTRACORPOREAL SHOCK WAVE LITHOTRIPSY Left 12/02/2018   Procedure: EXTRACORPOREAL SHOCK WAVE LITHOTRIPSY (ESWL);  Surgeon: Crista Elliot, MD;  Location: [facility];  Service: Urology;  Laterality: Left;   FOOT SURGERY     KNEE ARTHROPLASTY     POLYPECTOMY   04/21/2018   Procedure: POLYPECTOMY;  Surgeon: Malissa Hippo, MD;  [facility];  Service: Endoscopy;;   ROTATOR CUFF REPAIR     SHOULDER SURGERY      Family History  Problem Relation Age of Onset   Heart attack Mother    Cerebral aneurysm Father    Coronary artery disease Neg Hx    Diabetes Neg Hx    Hypertension Neg Hx     SOCIAL HISTORY: Social History   Socioeconomic History   Marital status: Widowed    Spouse name: Not on file   Number of children: Not on file   Years of education: Not on file   Highest education level: Not on file  Occupational History   Occupation: retired  Tobacco Use   Smoking status: Former    Current packs/day: 0.00    Average packs/day: 1 pack/day for 64.2 years (64.2 ttl pk-yrs)    Types: Cigarettes    Start date: 06/03/1959    Quit date: 08/30/2023    Years since quitting: 0.1   Smokeless tobacco: Never  Vaping Use   Vaping status: Never Used  Substance and Sexual Activity   Alcohol use: Yes    Alcohol/week: 0.0 standard drinks of alcohol   Drug use: No   Sexual activity: Not on file  Other Topics Concern   Not on file  Social History Narrative   Had been married for over 51 years and wife passed away in 11/23/15  from cancer. Has children and grandchildren and great grandchildren. Gets together with family for dinner frequently. Lives alone and cooks for self. Live in [place]. 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 Eats all food groups.     Social Drivers of Corporate investment banker Strain: Low Risk  (09/04/2023)   Received from [facility]   Overall Financial Resource Strain (CARDIA)    Difficulty of Paying Living Expenses: Not hard at all  Food Insecurity: No Food Insecurity (09/11/2023)   Hunger Vital Sign    Worried About Running Out of Food in the Last Year: Never true    Ran Out of Food in the Last Year: Never true  Transportation Needs: No  Transportation Needs (09/11/2023)   PRAPARE - Administrator, Civil Service (Medical): No    Lack of Transportation (Non-Medical): No  Physical Activity: Not on file  Stress: Not on file  Social Connections: Socially Isolated (09/11/2023)   Social Connection and Isolation Panel [NHANES]    Frequency of Communication with Friends and Family: More than three times a week    Frequency of Social Gatherings with Friends and Family: Twice a week    Attends Religious Services: Never    Database administrator or Organizations: No    Attends Banker Meetings: Patient unable to answer    Marital Status: Widowed  Intimate Partner Violence: Not At Risk (09/11/2023)   Humiliation, Afraid, Rape, and Kick questionnaire    Fear of Current or Ex-Partner: No    Emotionally Abused: No    Physically Abused: No    Sexually Abused: No    No Known Allergies  Current Outpatient Medications  Medication Sig Dispense Refill   acetaminophen (TYLENOL) 325 MG tablet Take 2 tablets (650 mg total) by mouth every 6 (six) hours as needed for mild pain (pain score 1-3) (or Fever >/= 101).     ascorbic acid (VITAMIN C) 250 MG tablet Take 1 tablet (250 mg total) by mouth 2 (two) times daily.     aspirin EC 81 MG tablet Take 1 tablet (81 mg total) by mouth daily. Swallow whole.     atorvastatin (LIPITOR) 40 MG tablet Take 40 mg by mouth daily.     escitalopram (LEXAPRO) 5 MG tablet Take 1 tablet (5 mg total) by mouth daily.     feeding supplement (ENSURE ENLIVE / ENSURE PLUS) LIQD Take 237 mLs by mouth 2 (two) times daily between meals.     Multiple Vitamin (MULTIVITAMIN WITH MINERALS) TABS tablet Take 1 tablet by mouth daily.     polyethylene glycol (MIRALAX / GLYCOLAX) 17 g packet Take 17 g by mouth 2 (two) times daily.     senna-docusate (SENOKOT-S) 8.6-50 MG tablet Take 2 tablets by mouth at bedtime.     traMADol (ULTRAM) 50 MG tablet Take by mouth every 6 (six) hours as needed.      oxyCODONE-acetaminophen (PERCOCET/ROXICET) 5-325 MG tablet Take 1 tablet by mouth every 4 (four) hours as needed for moderate pain (pain score 4-6). (Patient not taking: Reported on 11/03/2023) 30 tablet 0   rosuvastatin (CRESTOR) 20 MG tablet Take 1 tablet (20 mg total) by mouth daily. (Patient not taking: Reported on 11/03/2023)     No current facility-administered medications for this visit.    REVIEW OF SYSTEMS:  [X]  denotes positive finding, [ ]  denotes negative finding Cardiac  Comments:  Chest  pain or chest pressure:    Shortness of breath upon exertion:    Short of breath when lying flat:    Irregular heart rhythm:        Vascular    Pain in calf, thigh, or hip brought on by ambulation:    Pain in feet at night that wakes you up from your sleep:     Blood clot in your veins:    Leg swelling:         Pulmonary    Oxygen at home:    Productive cough:     Wheezing:         Neurologic    Sudden weakness in arms or legs:     Sudden numbness in arms or legs:     Sudden onset of difficulty speaking or slurred speech:    Temporary loss of vision in one eye:     Problems with dizziness:         Gastrointestinal    Blood in stool:     Vomited blood:         Genitourinary    Burning when urinating:     Blood in urine:        Psychiatric    Major depression:         Hematologic    Bleeding problems:    Problems with blood clotting too easily:        Skin    Rashes or ulcers:        Constitutional    Fever or chills:      PHYSICAL EXAM: Vitals:   11/03/23 1500  BP: 127/62  Pulse: 83  Resp: (!) 22  Temp: 98.1 F (36.7 C)  TempSrc: Temporal  SpO2: 95%  Weight: 240 lb (108.9 kg)  Height: 5\' 10"  (1.778 m)    GENERAL: The patient is a well-nourished male, in no acute distress. The vital signs are documented above. CARDIAC: There is a regular rate and rhythm.  VASCULAR:  Extensive bilateral lower extremity wounds with large heel eschars PULMONARY: No  respiratory distress ABDOMEN: Soft and non-tender  MUSCULOSKELETAL: There are no major deformities or cyanosis. NEUROLOGIC: No focal weakness or paresthesias are detected. PSYCHIATRIC: The patient has a normal affect.  DATA:   ABIs in the hospital on 09/02/2023 were 0.37 on the right monophasic and 0.32 on the left monophasic  Assessment/Plan:  78 y.o. male, with multiple comorbidities that was seen in the hospital for consultation on 09/12/2023 for bilateral lower extremity tissue loss with large heel eschar.  Ultimately underwent angiogram showing bilateral flush SFA occlusions and would need tibial bypass.  He has limited to no mobility.  He was offered bilateral above-knee amputations versus palliative wound care in the hospital.  Ultimately elected for palliative wound care.  Again reiterated plan for bilateral above-knee amputations or ongoing palliative wound care.  Patient states he needs to talk to his son's.  I had multiple conversations with him and his sons in the hospital.  He looks even more frail to me today and is in a wheelchair and transfer by Michiel Sites lift to get here from his facility in [place].  I will arrange follow-up in 3 months.  Happy to do his amputations if he changes his mind.  I do not think he is a candidate for open complex multilevel revascularization as we discussed today.  I do not have any anticipation that his wounds will heal given his severely depressed ABIs   [name],  MD [facility] [place] Office: [phone]

## 2023-11-04 DIAGNOSIS — L97519 Non-pressure chronic ulcer of other part of right foot with unspecified severity: Secondary | ICD-10-CM | POA: Diagnosis not present

## 2023-11-04 DIAGNOSIS — L97929 Non-pressure chronic ulcer of unspecified part of left lower leg with unspecified severity: Secondary | ICD-10-CM | POA: Diagnosis not present

## 2023-11-04 DIAGNOSIS — R5381 Other malaise: Secondary | ICD-10-CM | POA: Diagnosis not present

## 2023-11-04 DIAGNOSIS — L97919 Non-pressure chronic ulcer of unspecified part of right lower leg with unspecified severity: Secondary | ICD-10-CM | POA: Diagnosis not present

## 2023-11-04 DIAGNOSIS — I2510 Atherosclerotic heart disease of native coronary artery without angina pectoris: Secondary | ICD-10-CM | POA: Diagnosis not present

## 2023-11-04 DIAGNOSIS — I70235 Atherosclerosis of native arteries of right leg with ulceration of other part of foot: Secondary | ICD-10-CM | POA: Diagnosis not present

## 2023-11-07 DIAGNOSIS — E790 Hyperuricemia without signs of inflammatory arthritis and tophaceous disease: Secondary | ICD-10-CM | POA: Diagnosis not present

## 2023-11-10 DIAGNOSIS — M109 Gout, unspecified: Secondary | ICD-10-CM | POA: Diagnosis not present

## 2023-11-10 DIAGNOSIS — R5381 Other malaise: Secondary | ICD-10-CM | POA: Diagnosis not present

## 2023-11-24 DIAGNOSIS — I70235 Atherosclerosis of native arteries of right leg with ulceration of other part of foot: Secondary | ICD-10-CM | POA: Diagnosis not present

## 2023-11-24 DIAGNOSIS — L97519 Non-pressure chronic ulcer of other part of right foot with unspecified severity: Secondary | ICD-10-CM | POA: Diagnosis not present

## 2023-11-24 DIAGNOSIS — R5381 Other malaise: Secondary | ICD-10-CM | POA: Diagnosis not present

## 2023-11-24 DIAGNOSIS — L97919 Non-pressure chronic ulcer of unspecified part of right lower leg with unspecified severity: Secondary | ICD-10-CM | POA: Diagnosis not present

## 2023-11-24 DIAGNOSIS — I2510 Atherosclerotic heart disease of native coronary artery without angina pectoris: Secondary | ICD-10-CM | POA: Diagnosis not present

## 2023-11-24 DIAGNOSIS — L97929 Non-pressure chronic ulcer of unspecified part of left lower leg with unspecified severity: Secondary | ICD-10-CM | POA: Diagnosis not present

## 2023-11-25 DIAGNOSIS — N1831 Chronic kidney disease, stage 3a: Secondary | ICD-10-CM | POA: Diagnosis not present

## 2023-11-25 DIAGNOSIS — I739 Peripheral vascular disease, unspecified: Secondary | ICD-10-CM | POA: Diagnosis not present

## 2023-11-25 DIAGNOSIS — L97422 Non-pressure chronic ulcer of left heel and midfoot with fat layer exposed: Secondary | ICD-10-CM | POA: Diagnosis not present

## 2023-11-25 DIAGNOSIS — I70244 Atherosclerosis of native arteries of left leg with ulceration of heel and midfoot: Secondary | ICD-10-CM | POA: Diagnosis not present

## 2023-12-05 DIAGNOSIS — I7091 Generalized atherosclerosis: Secondary | ICD-10-CM | POA: Diagnosis not present

## 2023-12-05 DIAGNOSIS — B351 Tinea unguium: Secondary | ICD-10-CM | POA: Diagnosis not present

## 2023-12-07 DIAGNOSIS — M109 Gout, unspecified: Secondary | ICD-10-CM | POA: Diagnosis not present

## 2023-12-07 DIAGNOSIS — R5381 Other malaise: Secondary | ICD-10-CM | POA: Diagnosis not present

## 2023-12-08 DIAGNOSIS — M170 Bilateral primary osteoarthritis of knee: Secondary | ICD-10-CM | POA: Diagnosis not present

## 2023-12-08 DIAGNOSIS — M109 Gout, unspecified: Secondary | ICD-10-CM | POA: Diagnosis not present

## 2023-12-08 DIAGNOSIS — F32A Depression, unspecified: Secondary | ICD-10-CM | POA: Diagnosis not present

## 2023-12-23 DIAGNOSIS — N1831 Chronic kidney disease, stage 3a: Secondary | ICD-10-CM | POA: Diagnosis not present

## 2023-12-23 DIAGNOSIS — I70244 Atherosclerosis of native arteries of left leg with ulceration of heel and midfoot: Secondary | ICD-10-CM | POA: Diagnosis not present

## 2023-12-23 DIAGNOSIS — L97221 Non-pressure chronic ulcer of left calf limited to breakdown of skin: Secondary | ICD-10-CM | POA: Diagnosis not present

## 2023-12-23 DIAGNOSIS — L97423 Non-pressure chronic ulcer of left heel and midfoot with necrosis of muscle: Secondary | ICD-10-CM | POA: Diagnosis not present

## 2023-12-23 DIAGNOSIS — I739 Peripheral vascular disease, unspecified: Secondary | ICD-10-CM | POA: Diagnosis not present

## 2023-12-23 DIAGNOSIS — I70242 Atherosclerosis of native arteries of left leg with ulceration of calf: Secondary | ICD-10-CM | POA: Diagnosis not present

## 2023-12-31 DIAGNOSIS — G8929 Other chronic pain: Secondary | ICD-10-CM | POA: Diagnosis not present

## 2023-12-31 DIAGNOSIS — M170 Bilateral primary osteoarthritis of knee: Secondary | ICD-10-CM | POA: Diagnosis not present

## 2023-12-31 DIAGNOSIS — F1120 Opioid dependence, uncomplicated: Secondary | ICD-10-CM | POA: Diagnosis not present

## 2023-12-31 DIAGNOSIS — R5381 Other malaise: Secondary | ICD-10-CM | POA: Diagnosis not present

## 2024-01-04 DIAGNOSIS — M6281 Muscle weakness (generalized): Secondary | ICD-10-CM | POA: Diagnosis not present

## 2024-01-04 DIAGNOSIS — R278 Other lack of coordination: Secondary | ICD-10-CM | POA: Diagnosis not present

## 2024-01-05 DIAGNOSIS — R278 Other lack of coordination: Secondary | ICD-10-CM | POA: Diagnosis not present

## 2024-01-05 DIAGNOSIS — M6281 Muscle weakness (generalized): Secondary | ICD-10-CM | POA: Diagnosis not present

## 2024-01-06 DIAGNOSIS — R278 Other lack of coordination: Secondary | ICD-10-CM | POA: Diagnosis not present

## 2024-01-06 DIAGNOSIS — M6281 Muscle weakness (generalized): Secondary | ICD-10-CM | POA: Diagnosis not present

## 2024-01-07 DIAGNOSIS — R278 Other lack of coordination: Secondary | ICD-10-CM | POA: Diagnosis not present

## 2024-01-07 DIAGNOSIS — M6281 Muscle weakness (generalized): Secondary | ICD-10-CM | POA: Diagnosis not present

## 2024-01-08 DIAGNOSIS — M6281 Muscle weakness (generalized): Secondary | ICD-10-CM | POA: Diagnosis not present

## 2024-01-08 DIAGNOSIS — R278 Other lack of coordination: Secondary | ICD-10-CM | POA: Diagnosis not present

## 2024-01-11 DIAGNOSIS — M6281 Muscle weakness (generalized): Secondary | ICD-10-CM | POA: Diagnosis not present

## 2024-01-11 DIAGNOSIS — R278 Other lack of coordination: Secondary | ICD-10-CM | POA: Diagnosis not present

## 2024-01-12 DIAGNOSIS — R278 Other lack of coordination: Secondary | ICD-10-CM | POA: Diagnosis not present

## 2024-01-12 DIAGNOSIS — M6281 Muscle weakness (generalized): Secondary | ICD-10-CM | POA: Diagnosis not present

## 2024-01-14 DIAGNOSIS — M6281 Muscle weakness (generalized): Secondary | ICD-10-CM | POA: Diagnosis not present

## 2024-01-14 DIAGNOSIS — R278 Other lack of coordination: Secondary | ICD-10-CM | POA: Diagnosis not present

## 2024-01-15 DIAGNOSIS — M6281 Muscle weakness (generalized): Secondary | ICD-10-CM | POA: Diagnosis not present

## 2024-01-15 DIAGNOSIS — R278 Other lack of coordination: Secondary | ICD-10-CM | POA: Diagnosis not present

## 2024-01-18 DIAGNOSIS — R278 Other lack of coordination: Secondary | ICD-10-CM | POA: Diagnosis not present

## 2024-01-18 DIAGNOSIS — M6281 Muscle weakness (generalized): Secondary | ICD-10-CM | POA: Diagnosis not present

## 2024-01-19 DIAGNOSIS — R278 Other lack of coordination: Secondary | ICD-10-CM | POA: Diagnosis not present

## 2024-01-19 DIAGNOSIS — M6281 Muscle weakness (generalized): Secondary | ICD-10-CM | POA: Diagnosis not present

## 2024-01-20 DIAGNOSIS — N1831 Chronic kidney disease, stage 3a: Secondary | ICD-10-CM | POA: Diagnosis not present

## 2024-01-20 DIAGNOSIS — R278 Other lack of coordination: Secondary | ICD-10-CM | POA: Diagnosis not present

## 2024-01-20 DIAGNOSIS — I70244 Atherosclerosis of native arteries of left leg with ulceration of heel and midfoot: Secondary | ICD-10-CM | POA: Diagnosis not present

## 2024-01-20 DIAGNOSIS — M6281 Muscle weakness (generalized): Secondary | ICD-10-CM | POA: Diagnosis not present

## 2024-01-20 DIAGNOSIS — L97422 Non-pressure chronic ulcer of left heel and midfoot with fat layer exposed: Secondary | ICD-10-CM | POA: Diagnosis not present

## 2024-01-20 DIAGNOSIS — I739 Peripheral vascular disease, unspecified: Secondary | ICD-10-CM | POA: Diagnosis not present

## 2024-01-20 DIAGNOSIS — L97429 Non-pressure chronic ulcer of left heel and midfoot with unspecified severity: Secondary | ICD-10-CM | POA: Diagnosis not present

## 2024-01-21 DIAGNOSIS — R278 Other lack of coordination: Secondary | ICD-10-CM | POA: Diagnosis not present

## 2024-01-21 DIAGNOSIS — M6281 Muscle weakness (generalized): Secondary | ICD-10-CM | POA: Diagnosis not present

## 2024-01-22 DIAGNOSIS — M6281 Muscle weakness (generalized): Secondary | ICD-10-CM | POA: Diagnosis not present

## 2024-01-22 DIAGNOSIS — R278 Other lack of coordination: Secondary | ICD-10-CM | POA: Diagnosis not present

## 2024-01-25 DIAGNOSIS — M6281 Muscle weakness (generalized): Secondary | ICD-10-CM | POA: Diagnosis not present

## 2024-01-25 DIAGNOSIS — R278 Other lack of coordination: Secondary | ICD-10-CM | POA: Diagnosis not present

## 2024-01-26 DIAGNOSIS — M6281 Muscle weakness (generalized): Secondary | ICD-10-CM | POA: Diagnosis not present

## 2024-01-26 DIAGNOSIS — R278 Other lack of coordination: Secondary | ICD-10-CM | POA: Diagnosis not present

## 2024-01-27 DIAGNOSIS — M6281 Muscle weakness (generalized): Secondary | ICD-10-CM | POA: Diagnosis not present

## 2024-01-27 DIAGNOSIS — R278 Other lack of coordination: Secondary | ICD-10-CM | POA: Diagnosis not present

## 2024-01-28 DIAGNOSIS — M6281 Muscle weakness (generalized): Secondary | ICD-10-CM | POA: Diagnosis not present

## 2024-01-28 DIAGNOSIS — R278 Other lack of coordination: Secondary | ICD-10-CM | POA: Diagnosis not present

## 2024-01-29 DIAGNOSIS — M6281 Muscle weakness (generalized): Secondary | ICD-10-CM | POA: Diagnosis not present

## 2024-01-29 DIAGNOSIS — R278 Other lack of coordination: Secondary | ICD-10-CM | POA: Diagnosis not present

## 2024-02-01 DIAGNOSIS — M6281 Muscle weakness (generalized): Secondary | ICD-10-CM | POA: Diagnosis not present

## 2024-02-01 DIAGNOSIS — R278 Other lack of coordination: Secondary | ICD-10-CM | POA: Diagnosis not present

## 2024-02-02 ENCOUNTER — Encounter: Payer: Self-pay | Admitting: Vascular Surgery

## 2024-02-02 ENCOUNTER — Ambulatory Visit: Attending: Vascular Surgery | Admitting: Vascular Surgery

## 2024-02-02 VITALS — BP 107/76 | HR 89 | Temp 97.7°F | Resp 20 | Ht 70.0 in | Wt 221.0 lb

## 2024-02-02 DIAGNOSIS — R278 Other lack of coordination: Secondary | ICD-10-CM | POA: Diagnosis not present

## 2024-02-02 DIAGNOSIS — I70223 Atherosclerosis of native arteries of extremities with rest pain, bilateral legs: Secondary | ICD-10-CM | POA: Diagnosis not present

## 2024-02-02 DIAGNOSIS — M6281 Muscle weakness (generalized): Secondary | ICD-10-CM | POA: Diagnosis not present

## 2024-02-02 NOTE — Progress Notes (Signed)
 Patient name: Jack [name] MRN: [id number] DOB: Jan 20, 1946 Sex: male  REASON FOR CONSULT: Ongoing follow-up  HPI: Jack [name] is a 78 y.o. male, with multiple comorbidities that was seen in the hospital for consultation on 09/12/2023 for bilateral lower extremity tissue loss with large heel eschar on bilateral lower extremities.  Ultimately underwent angiogram showing bilateral flush SFA occlusions and would require tibial bypass.  He has limited to no mobility.  He was offered bilateral above-knee amputations versus palliative wound care in the hospital.  Ultimately elected for palliative wound care.  Now in a facility in [place].  Last seen 3 months ago in a wheelchair using a Hoyer lift for transfer.  Patient today states the ulcer on his right heel has improved.  Still doing wound care for the left heel.  Nursing facility states he is using a slide board for transfers but not standing.  Past Medical History:  Diagnosis Date   Bradycardia    Coronary atherosclerosis of native coronary artery    Multivessel status post CABG   History of kidney stones    Hyperlipidemia    Hypertension    Peripheral vascular disease (HCC)    Tobacco user     Past Surgical History:  Procedure Laterality Date   ABDOMINAL AORTOGRAM W/LOWER EXTREMITY Bilateral 09/18/2023   Procedure: ABDOMINAL AORTOGRAM W/LOWER EXTREMITY;  Surgeon: Young Hensen, MD;  [facility];  Service: Cardiovascular;  Laterality: Bilateral;   APPENDECTOMY     age 31   CHOLECYSTECTOMY  2003   COLONOSCOPY N/A 04/21/2018   Procedure: COLONOSCOPY;  Surgeon: Ruby Corporal, MD;  [facility];  Service: Endoscopy;  Laterality: N/A;  12:00-rescheduled to 8/28 @ 10:30am per Lorelee Roger   CORONARY ARTERY BYPASS GRAFT  2008   LIMA to LAD, SVG to ramus, SVG to PDA and PLA   EXTRACORPOREAL SHOCK WAVE LITHOTRIPSY Left 12/02/2018   Procedure: EXTRACORPOREAL SHOCK WAVE LITHOTRIPSY (ESWL);  Surgeon: Samson Croak, MD;  Location: [facility];  Service: Urology;  Laterality: Left;   FOOT SURGERY     KNEE ARTHROPLASTY     POLYPECTOMY  04/21/2018   Procedure: POLYPECTOMY;  Surgeon: Ruby Corporal, MD;  [facility];  Service: Endoscopy;;   ROTATOR CUFF REPAIR     SHOULDER SURGERY      Family History  Problem Relation Age of Onset   Heart attack Mother    Cerebral aneurysm Father    Coronary artery disease Neg Hx    Diabetes Neg Hx    Hypertension Neg Hx     SOCIAL HISTORY: Social History   Socioeconomic History   Marital status: Widowed    Spouse name: Not on file   Number of children: Not on file   Years of education: Not on file   Highest education level: Not on file  Occupational History   Occupation: retired  Tobacco Use   Smoking status: Former    Current packs/day: 0.00    Average packs/day: 1 pack/day for 64.2 years (64.2 ttl pk-yrs)    Types: Cigarettes    Start date: 06/03/1959    Quit date: 08/30/2023    Years since quitting: 0.4   Smokeless tobacco: Never  Vaping Use   Vaping status: Never Used  Substance and Sexual Activity   Alcohol use: Yes    Alcohol/week: 0.0 standard drinks of alcohol   Drug use: No   Sexual activity: Not on file  Other Topics  Concern   Not on file  Social History Narrative   Had been married for over 51 years and wife passed away in Feb 11, 2016 from cancer. Has children and grandchildren and great grandchildren. Gets together with family for dinner frequently. Lives alone and cooks for self. Live in [place]. 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 Eats all food groups.     Social Drivers of Corporate investment banker Strain: Low Risk  (09/04/2023)   Received from [facility]   Overall Financial Resource Strain (CARDIA)    Difficulty of Paying Living Expenses: Not hard at all  Food Insecurity: No Food Insecurity (09/11/2023)   Hunger Vital Sign    Worried About  Running Out of Food in the Last Year: Never true    Ran Out of Food in the Last Year: Never true  Transportation Needs: No Transportation Needs (09/11/2023)   PRAPARE - Administrator, Civil Service (Medical): No    Lack of Transportation (Non-Medical): No  Physical Activity: Not on file  Stress: Not on file  Social Connections: Socially Isolated (09/11/2023)   Social Connection and Isolation Panel [NHANES]    Frequency of Communication with Friends and Family: More than three times a week    Frequency of Social Gatherings with Friends and Family: Twice a week    Attends Religious Services: Never    Database administrator or Organizations: No    Attends Banker Meetings: Patient unable to answer    Marital Status: Widowed  Intimate Partner Violence: Not At Risk (09/11/2023)   Humiliation, Afraid, Rape, and Kick questionnaire    Fear of Current or Ex-Partner: No    Emotionally Abused: No    Physically Abused: No    Sexually Abused: No    No Known Allergies  Current Outpatient Medications  Medication Sig Dispense Refill   acetaminophen  (TYLENOL ) 325 MG tablet Take 2 tablets (650 mg total) by mouth every 6 (six) hours as needed for mild pain (pain score 1-3) (or Fever >/= 101).     ascorbic acid  (VITAMIN C ) 250 MG tablet Take 1 tablet (250 mg total) by mouth 2 (two) times daily.     aspirin  EC 81 MG tablet Take 1 tablet (81 mg total) by mouth daily. Swallow whole.     atorvastatin (LIPITOR) 40 MG tablet Take 40 mg by mouth daily.     escitalopram  (LEXAPRO ) 5 MG tablet Take 1 tablet (5 mg total) by mouth daily.     feeding supplement (ENSURE ENLIVE / ENSURE PLUS) LIQD Take 237 mLs by mouth 2 (two) times daily between meals.     Multiple Vitamin (MULTIVITAMIN WITH MINERALS) TABS tablet Take 1 tablet by mouth daily.     oxyCODONE -acetaminophen  (PERCOCET/ROXICET) 5-325 MG tablet Take 1 tablet by mouth every 4 (four) hours as needed for moderate pain (pain score 4-6).  (Patient not taking: Reported on 11/03/2023) 30 tablet 0   polyethylene glycol (MIRALAX  / GLYCOLAX ) 17 g packet Take 17 g by mouth 2 (two) times daily.     rosuvastatin  (CRESTOR ) 20 MG tablet Take 1 tablet (20 mg total) by mouth daily. (Patient not taking: Reported on 11/03/2023)     senna-docusate (SENOKOT-S) 8.6-50 MG tablet Take 2 tablets by mouth at bedtime.     traMADol (ULTRAM) 50 MG tablet Take by mouth every 6 (six) hours as needed.     No current facility-administered  medications for this visit.    REVIEW OF SYSTEMS:  [X]  denotes positive finding, [ ]  denotes negative finding Cardiac  Comments:  Chest pain or chest pressure:    Shortness of breath upon exertion:    Short of breath when lying flat:    Irregular heart rhythm:        Vascular    Pain in calf, thigh, or hip brought on by ambulation:    Pain in feet at night that wakes you up from your sleep:     Blood clot in your veins:    Leg swelling:         Pulmonary    Oxygen at home:    Productive cough:     Wheezing:         Neurologic    Sudden weakness in arms or legs:     Sudden numbness in arms or legs:     Sudden onset of difficulty speaking or slurred speech:    Temporary loss of vision in one eye:     Problems with dizziness:         Gastrointestinal    Blood in stool:     Vomited blood:         Genitourinary    Burning when urinating:     Blood in urine:        Psychiatric    Major depression:         Hematologic    Bleeding problems:    Problems with blood clotting too easily:        Skin    Rashes or ulcers:        Constitutional    Fever or chills:      PHYSICAL EXAM: There were no vitals filed for this visit.   GENERAL: The patient is a well-nourished male, in no acute distress. The vital signs are documented above. CARDIAC: There is a regular rate and rhythm.  VASCULAR:  Right heel eschar healed Left heel wound persistent No palpable pedal pulses PULMONARY: No respiratory  distress ABDOMEN: Soft and non-tender  MUSCULOSKELETAL: There are no major deformities or cyanosis. NEUROLOGIC: No focal weakness or paresthesias are detected. PSYCHIATRIC: The patient has a normal affect.  DATA:   ABIs in the hospital on 09/02/2023 were 0.37 on the right monophasic and 0.32 on the left monophasic  Assessment/Plan:  78 y.o. male, with multiple comorbidities that was seen in the hospital for consultation on 09/12/2023 for bilateral lower extremity tissue loss with large heel eschar.  Ultimately underwent angiogram showing bilateral flush SFA occlusions and would need tibial bypass.  He has limited to no mobility.  He was offered bilateral above-knee amputations versus palliative wound care in the hospital.  Ultimately elected for palliative wound care.  Fortunately the eschar on his right heel has improved.  Still has a persistent large ulcer on his left heel.  Remains severely debilitated in a nursing facility using a slide board for transfers.  I do not feel he is a good candidate for complex tibial bypass.  He would prefer to continue palliative wound care in his facility which is appropriate.  The facility states they have a wound care doctor coming in weekly.  Continue to offload the heel.  I will see him in 6 months.  Again discussed above-knee amputation as an option in the future if this worsens.   Young Hensen, MD [facility] [place] Office: [phone]

## 2024-02-03 DIAGNOSIS — M6281 Muscle weakness (generalized): Secondary | ICD-10-CM | POA: Diagnosis not present

## 2024-02-03 DIAGNOSIS — I70235 Atherosclerosis of native arteries of right leg with ulceration of other part of foot: Secondary | ICD-10-CM | POA: Diagnosis not present

## 2024-02-03 DIAGNOSIS — R278 Other lack of coordination: Secondary | ICD-10-CM | POA: Diagnosis not present

## 2024-02-03 DIAGNOSIS — L97929 Non-pressure chronic ulcer of unspecified part of left lower leg with unspecified severity: Secondary | ICD-10-CM | POA: Diagnosis not present

## 2024-02-03 DIAGNOSIS — I2510 Atherosclerotic heart disease of native coronary artery without angina pectoris: Secondary | ICD-10-CM | POA: Diagnosis not present

## 2024-02-04 DIAGNOSIS — M6281 Muscle weakness (generalized): Secondary | ICD-10-CM | POA: Diagnosis not present

## 2024-02-04 DIAGNOSIS — R278 Other lack of coordination: Secondary | ICD-10-CM | POA: Diagnosis not present

## 2024-02-05 DIAGNOSIS — M6281 Muscle weakness (generalized): Secondary | ICD-10-CM | POA: Diagnosis not present

## 2024-02-05 DIAGNOSIS — R278 Other lack of coordination: Secondary | ICD-10-CM | POA: Diagnosis not present

## 2024-02-06 DIAGNOSIS — M6281 Muscle weakness (generalized): Secondary | ICD-10-CM | POA: Diagnosis not present

## 2024-02-06 DIAGNOSIS — R278 Other lack of coordination: Secondary | ICD-10-CM | POA: Diagnosis not present

## 2024-02-08 DIAGNOSIS — R278 Other lack of coordination: Secondary | ICD-10-CM | POA: Diagnosis not present

## 2024-02-08 DIAGNOSIS — M6281 Muscle weakness (generalized): Secondary | ICD-10-CM | POA: Diagnosis not present

## 2024-02-09 DIAGNOSIS — M6281 Muscle weakness (generalized): Secondary | ICD-10-CM | POA: Diagnosis not present

## 2024-02-09 DIAGNOSIS — R278 Other lack of coordination: Secondary | ICD-10-CM | POA: Diagnosis not present

## 2024-02-10 DIAGNOSIS — M6281 Muscle weakness (generalized): Secondary | ICD-10-CM | POA: Diagnosis not present

## 2024-02-10 DIAGNOSIS — R278 Other lack of coordination: Secondary | ICD-10-CM | POA: Diagnosis not present

## 2024-02-11 DIAGNOSIS — M6281 Muscle weakness (generalized): Secondary | ICD-10-CM | POA: Diagnosis not present

## 2024-02-11 DIAGNOSIS — R278 Other lack of coordination: Secondary | ICD-10-CM | POA: Diagnosis not present

## 2024-02-12 DIAGNOSIS — M6281 Muscle weakness (generalized): Secondary | ICD-10-CM | POA: Diagnosis not present

## 2024-02-12 DIAGNOSIS — R278 Other lack of coordination: Secondary | ICD-10-CM | POA: Diagnosis not present

## 2024-02-15 DIAGNOSIS — M6281 Muscle weakness (generalized): Secondary | ICD-10-CM | POA: Diagnosis not present

## 2024-02-15 DIAGNOSIS — R278 Other lack of coordination: Secondary | ICD-10-CM | POA: Diagnosis not present

## 2024-02-16 DIAGNOSIS — M6281 Muscle weakness (generalized): Secondary | ICD-10-CM | POA: Diagnosis not present

## 2024-02-16 DIAGNOSIS — G8929 Other chronic pain: Secondary | ICD-10-CM | POA: Diagnosis not present

## 2024-02-16 DIAGNOSIS — B351 Tinea unguium: Secondary | ICD-10-CM | POA: Diagnosis not present

## 2024-02-16 DIAGNOSIS — I7091 Generalized atherosclerosis: Secondary | ICD-10-CM | POA: Diagnosis not present

## 2024-02-16 DIAGNOSIS — R5381 Other malaise: Secondary | ICD-10-CM | POA: Diagnosis not present

## 2024-02-16 DIAGNOSIS — L97929 Non-pressure chronic ulcer of unspecified part of left lower leg with unspecified severity: Secondary | ICD-10-CM | POA: Diagnosis not present

## 2024-02-16 DIAGNOSIS — F1120 Opioid dependence, uncomplicated: Secondary | ICD-10-CM | POA: Diagnosis not present

## 2024-02-16 DIAGNOSIS — R278 Other lack of coordination: Secondary | ICD-10-CM | POA: Diagnosis not present

## 2024-02-17 DIAGNOSIS — L97422 Non-pressure chronic ulcer of left heel and midfoot with fat layer exposed: Secondary | ICD-10-CM | POA: Diagnosis not present

## 2024-02-17 DIAGNOSIS — R278 Other lack of coordination: Secondary | ICD-10-CM | POA: Diagnosis not present

## 2024-02-17 DIAGNOSIS — I739 Peripheral vascular disease, unspecified: Secondary | ICD-10-CM | POA: Diagnosis not present

## 2024-02-17 DIAGNOSIS — M6281 Muscle weakness (generalized): Secondary | ICD-10-CM | POA: Diagnosis not present

## 2024-02-17 DIAGNOSIS — N1831 Chronic kidney disease, stage 3a: Secondary | ICD-10-CM | POA: Diagnosis not present

## 2024-02-17 DIAGNOSIS — I70244 Atherosclerosis of native arteries of left leg with ulceration of heel and midfoot: Secondary | ICD-10-CM | POA: Diagnosis not present

## 2024-02-18 DIAGNOSIS — R278 Other lack of coordination: Secondary | ICD-10-CM | POA: Diagnosis not present

## 2024-02-18 DIAGNOSIS — M6281 Muscle weakness (generalized): Secondary | ICD-10-CM | POA: Diagnosis not present

## 2024-02-18 DIAGNOSIS — L97429 Non-pressure chronic ulcer of left heel and midfoot with unspecified severity: Secondary | ICD-10-CM | POA: Diagnosis not present

## 2024-02-19 DIAGNOSIS — M6281 Muscle weakness (generalized): Secondary | ICD-10-CM | POA: Diagnosis not present

## 2024-02-19 DIAGNOSIS — R278 Other lack of coordination: Secondary | ICD-10-CM | POA: Diagnosis not present

## 2024-02-22 DIAGNOSIS — M6281 Muscle weakness (generalized): Secondary | ICD-10-CM | POA: Diagnosis not present

## 2024-02-22 DIAGNOSIS — R278 Other lack of coordination: Secondary | ICD-10-CM | POA: Diagnosis not present

## 2024-02-23 DIAGNOSIS — I70235 Atherosclerosis of native arteries of right leg with ulceration of other part of foot: Secondary | ICD-10-CM | POA: Diagnosis not present

## 2024-02-23 DIAGNOSIS — M6281 Muscle weakness (generalized): Secondary | ICD-10-CM | POA: Diagnosis not present

## 2024-02-23 DIAGNOSIS — R278 Other lack of coordination: Secondary | ICD-10-CM | POA: Diagnosis not present

## 2024-02-24 DIAGNOSIS — M6281 Muscle weakness (generalized): Secondary | ICD-10-CM | POA: Diagnosis not present

## 2024-02-24 DIAGNOSIS — R278 Other lack of coordination: Secondary | ICD-10-CM | POA: Diagnosis not present

## 2024-02-24 DIAGNOSIS — I70235 Atherosclerosis of native arteries of right leg with ulceration of other part of foot: Secondary | ICD-10-CM | POA: Diagnosis not present

## 2024-02-25 DIAGNOSIS — R278 Other lack of coordination: Secondary | ICD-10-CM | POA: Diagnosis not present

## 2024-02-25 DIAGNOSIS — M6281 Muscle weakness (generalized): Secondary | ICD-10-CM | POA: Diagnosis not present

## 2024-02-25 DIAGNOSIS — I70235 Atherosclerosis of native arteries of right leg with ulceration of other part of foot: Secondary | ICD-10-CM | POA: Diagnosis not present

## 2024-02-26 DIAGNOSIS — M6281 Muscle weakness (generalized): Secondary | ICD-10-CM | POA: Diagnosis not present

## 2024-02-26 DIAGNOSIS — R278 Other lack of coordination: Secondary | ICD-10-CM | POA: Diagnosis not present

## 2024-02-26 DIAGNOSIS — I70235 Atherosclerosis of native arteries of right leg with ulceration of other part of foot: Secondary | ICD-10-CM | POA: Diagnosis not present

## 2024-02-29 DIAGNOSIS — M6281 Muscle weakness (generalized): Secondary | ICD-10-CM | POA: Diagnosis not present

## 2024-02-29 DIAGNOSIS — I70235 Atherosclerosis of native arteries of right leg with ulceration of other part of foot: Secondary | ICD-10-CM | POA: Diagnosis not present

## 2024-02-29 DIAGNOSIS — R278 Other lack of coordination: Secondary | ICD-10-CM | POA: Diagnosis not present

## 2024-03-01 DIAGNOSIS — M6281 Muscle weakness (generalized): Secondary | ICD-10-CM | POA: Diagnosis not present

## 2024-03-01 DIAGNOSIS — I70235 Atherosclerosis of native arteries of right leg with ulceration of other part of foot: Secondary | ICD-10-CM | POA: Diagnosis not present

## 2024-03-01 DIAGNOSIS — R278 Other lack of coordination: Secondary | ICD-10-CM | POA: Diagnosis not present

## 2024-03-02 DIAGNOSIS — I70235 Atherosclerosis of native arteries of right leg with ulceration of other part of foot: Secondary | ICD-10-CM | POA: Diagnosis not present

## 2024-03-02 DIAGNOSIS — R278 Other lack of coordination: Secondary | ICD-10-CM | POA: Diagnosis not present

## 2024-03-02 DIAGNOSIS — M6281 Muscle weakness (generalized): Secondary | ICD-10-CM | POA: Diagnosis not present

## 2024-03-03 DIAGNOSIS — I70235 Atherosclerosis of native arteries of right leg with ulceration of other part of foot: Secondary | ICD-10-CM | POA: Diagnosis not present

## 2024-03-03 DIAGNOSIS — M6281 Muscle weakness (generalized): Secondary | ICD-10-CM | POA: Diagnosis not present

## 2024-03-03 DIAGNOSIS — R278 Other lack of coordination: Secondary | ICD-10-CM | POA: Diagnosis not present

## 2024-03-04 DIAGNOSIS — I70235 Atherosclerosis of native arteries of right leg with ulceration of other part of foot: Secondary | ICD-10-CM | POA: Diagnosis not present

## 2024-03-04 DIAGNOSIS — R278 Other lack of coordination: Secondary | ICD-10-CM | POA: Diagnosis not present

## 2024-03-04 DIAGNOSIS — M6281 Muscle weakness (generalized): Secondary | ICD-10-CM | POA: Diagnosis not present

## 2024-03-07 DIAGNOSIS — M6281 Muscle weakness (generalized): Secondary | ICD-10-CM | POA: Diagnosis not present

## 2024-03-07 DIAGNOSIS — F32A Depression, unspecified: Secondary | ICD-10-CM | POA: Diagnosis not present

## 2024-03-07 DIAGNOSIS — E785 Hyperlipidemia, unspecified: Secondary | ICD-10-CM | POA: Diagnosis not present

## 2024-03-07 DIAGNOSIS — M109 Gout, unspecified: Secondary | ICD-10-CM | POA: Diagnosis not present

## 2024-03-07 DIAGNOSIS — R278 Other lack of coordination: Secondary | ICD-10-CM | POA: Diagnosis not present

## 2024-03-07 DIAGNOSIS — I70235 Atherosclerosis of native arteries of right leg with ulceration of other part of foot: Secondary | ICD-10-CM | POA: Diagnosis not present

## 2024-03-08 DIAGNOSIS — I70235 Atherosclerosis of native arteries of right leg with ulceration of other part of foot: Secondary | ICD-10-CM | POA: Diagnosis not present

## 2024-03-08 DIAGNOSIS — M6281 Muscle weakness (generalized): Secondary | ICD-10-CM | POA: Diagnosis not present

## 2024-03-08 DIAGNOSIS — R278 Other lack of coordination: Secondary | ICD-10-CM | POA: Diagnosis not present

## 2024-03-09 DIAGNOSIS — R278 Other lack of coordination: Secondary | ICD-10-CM | POA: Diagnosis not present

## 2024-03-09 DIAGNOSIS — M6281 Muscle weakness (generalized): Secondary | ICD-10-CM | POA: Diagnosis not present

## 2024-03-09 DIAGNOSIS — I70235 Atherosclerosis of native arteries of right leg with ulceration of other part of foot: Secondary | ICD-10-CM | POA: Diagnosis not present

## 2024-03-10 DIAGNOSIS — M6281 Muscle weakness (generalized): Secondary | ICD-10-CM | POA: Diagnosis not present

## 2024-03-10 DIAGNOSIS — I70235 Atherosclerosis of native arteries of right leg with ulceration of other part of foot: Secondary | ICD-10-CM | POA: Diagnosis not present

## 2024-03-10 DIAGNOSIS — R278 Other lack of coordination: Secondary | ICD-10-CM | POA: Diagnosis not present

## 2024-03-11 DIAGNOSIS — I70235 Atherosclerosis of native arteries of right leg with ulceration of other part of foot: Secondary | ICD-10-CM | POA: Diagnosis not present

## 2024-03-11 DIAGNOSIS — R278 Other lack of coordination: Secondary | ICD-10-CM | POA: Diagnosis not present

## 2024-03-11 DIAGNOSIS — M6281 Muscle weakness (generalized): Secondary | ICD-10-CM | POA: Diagnosis not present

## 2024-03-14 DIAGNOSIS — R278 Other lack of coordination: Secondary | ICD-10-CM | POA: Diagnosis not present

## 2024-03-14 DIAGNOSIS — M6281 Muscle weakness (generalized): Secondary | ICD-10-CM | POA: Diagnosis not present

## 2024-03-14 DIAGNOSIS — I70235 Atherosclerosis of native arteries of right leg with ulceration of other part of foot: Secondary | ICD-10-CM | POA: Diagnosis not present

## 2024-03-15 DIAGNOSIS — R5381 Other malaise: Secondary | ICD-10-CM | POA: Diagnosis not present

## 2024-03-15 DIAGNOSIS — G8929 Other chronic pain: Secondary | ICD-10-CM | POA: Diagnosis not present

## 2024-03-15 DIAGNOSIS — F1120 Opioid dependence, uncomplicated: Secondary | ICD-10-CM | POA: Diagnosis not present

## 2024-03-15 DIAGNOSIS — M6281 Muscle weakness (generalized): Secondary | ICD-10-CM | POA: Diagnosis not present

## 2024-03-15 DIAGNOSIS — R278 Other lack of coordination: Secondary | ICD-10-CM | POA: Diagnosis not present

## 2024-03-15 DIAGNOSIS — L97519 Non-pressure chronic ulcer of other part of right foot with unspecified severity: Secondary | ICD-10-CM | POA: Diagnosis not present

## 2024-03-15 DIAGNOSIS — M170 Bilateral primary osteoarthritis of knee: Secondary | ICD-10-CM | POA: Diagnosis not present

## 2024-03-15 DIAGNOSIS — L97929 Non-pressure chronic ulcer of unspecified part of left lower leg with unspecified severity: Secondary | ICD-10-CM | POA: Diagnosis not present

## 2024-03-15 DIAGNOSIS — I70235 Atherosclerosis of native arteries of right leg with ulceration of other part of foot: Secondary | ICD-10-CM | POA: Diagnosis not present

## 2024-03-16 DIAGNOSIS — N1831 Chronic kidney disease, stage 3a: Secondary | ICD-10-CM | POA: Diagnosis not present

## 2024-03-16 DIAGNOSIS — I70235 Atherosclerosis of native arteries of right leg with ulceration of other part of foot: Secondary | ICD-10-CM | POA: Diagnosis not present

## 2024-03-16 DIAGNOSIS — R278 Other lack of coordination: Secondary | ICD-10-CM | POA: Diagnosis not present

## 2024-03-16 DIAGNOSIS — I70244 Atherosclerosis of native arteries of left leg with ulceration of heel and midfoot: Secondary | ICD-10-CM | POA: Diagnosis not present

## 2024-03-16 DIAGNOSIS — L97422 Non-pressure chronic ulcer of left heel and midfoot with fat layer exposed: Secondary | ICD-10-CM | POA: Diagnosis not present

## 2024-03-16 DIAGNOSIS — I739 Peripheral vascular disease, unspecified: Secondary | ICD-10-CM | POA: Diagnosis not present

## 2024-03-16 DIAGNOSIS — M6281 Muscle weakness (generalized): Secondary | ICD-10-CM | POA: Diagnosis not present

## 2024-03-17 DIAGNOSIS — R278 Other lack of coordination: Secondary | ICD-10-CM | POA: Diagnosis not present

## 2024-03-17 DIAGNOSIS — I70235 Atherosclerosis of native arteries of right leg with ulceration of other part of foot: Secondary | ICD-10-CM | POA: Diagnosis not present

## 2024-03-17 DIAGNOSIS — M6281 Muscle weakness (generalized): Secondary | ICD-10-CM | POA: Diagnosis not present

## 2024-03-18 DIAGNOSIS — M6281 Muscle weakness (generalized): Secondary | ICD-10-CM | POA: Diagnosis not present

## 2024-03-18 DIAGNOSIS — R278 Other lack of coordination: Secondary | ICD-10-CM | POA: Diagnosis not present

## 2024-03-18 DIAGNOSIS — I70235 Atherosclerosis of native arteries of right leg with ulceration of other part of foot: Secondary | ICD-10-CM | POA: Diagnosis not present

## 2024-03-22 DIAGNOSIS — I2510 Atherosclerotic heart disease of native coronary artery without angina pectoris: Secondary | ICD-10-CM | POA: Diagnosis not present

## 2024-03-22 DIAGNOSIS — I70235 Atherosclerosis of native arteries of right leg with ulceration of other part of foot: Secondary | ICD-10-CM | POA: Diagnosis not present

## 2024-03-22 DIAGNOSIS — R5381 Other malaise: Secondary | ICD-10-CM | POA: Diagnosis not present

## 2024-03-22 DIAGNOSIS — G8929 Other chronic pain: Secondary | ICD-10-CM | POA: Diagnosis not present

## 2024-03-22 DIAGNOSIS — M6281 Muscle weakness (generalized): Secondary | ICD-10-CM | POA: Diagnosis not present

## 2024-03-22 DIAGNOSIS — R278 Other lack of coordination: Secondary | ICD-10-CM | POA: Diagnosis not present

## 2024-03-23 DIAGNOSIS — M6281 Muscle weakness (generalized): Secondary | ICD-10-CM | POA: Diagnosis not present

## 2024-03-23 DIAGNOSIS — R278 Other lack of coordination: Secondary | ICD-10-CM | POA: Diagnosis not present

## 2024-03-23 DIAGNOSIS — I70235 Atherosclerosis of native arteries of right leg with ulceration of other part of foot: Secondary | ICD-10-CM | POA: Diagnosis not present

## 2024-03-24 DIAGNOSIS — M6281 Muscle weakness (generalized): Secondary | ICD-10-CM | POA: Diagnosis not present

## 2024-03-24 DIAGNOSIS — R278 Other lack of coordination: Secondary | ICD-10-CM | POA: Diagnosis not present

## 2024-03-24 DIAGNOSIS — I70235 Atherosclerosis of native arteries of right leg with ulceration of other part of foot: Secondary | ICD-10-CM | POA: Diagnosis not present

## 2024-03-25 DIAGNOSIS — I70235 Atherosclerosis of native arteries of right leg with ulceration of other part of foot: Secondary | ICD-10-CM | POA: Diagnosis not present

## 2024-03-25 DIAGNOSIS — M6281 Muscle weakness (generalized): Secondary | ICD-10-CM | POA: Diagnosis not present

## 2024-03-25 DIAGNOSIS — R278 Other lack of coordination: Secondary | ICD-10-CM | POA: Diagnosis not present

## 2024-03-28 DIAGNOSIS — I70235 Atherosclerosis of native arteries of right leg with ulceration of other part of foot: Secondary | ICD-10-CM | POA: Diagnosis not present

## 2024-03-28 DIAGNOSIS — M6281 Muscle weakness (generalized): Secondary | ICD-10-CM | POA: Diagnosis not present

## 2024-03-28 DIAGNOSIS — R278 Other lack of coordination: Secondary | ICD-10-CM | POA: Diagnosis not present

## 2024-03-29 DIAGNOSIS — R278 Other lack of coordination: Secondary | ICD-10-CM | POA: Diagnosis not present

## 2024-03-29 DIAGNOSIS — M6281 Muscle weakness (generalized): Secondary | ICD-10-CM | POA: Diagnosis not present

## 2024-03-29 DIAGNOSIS — I70235 Atherosclerosis of native arteries of right leg with ulceration of other part of foot: Secondary | ICD-10-CM | POA: Diagnosis not present

## 2024-03-30 DIAGNOSIS — I70235 Atherosclerosis of native arteries of right leg with ulceration of other part of foot: Secondary | ICD-10-CM | POA: Diagnosis not present

## 2024-03-30 DIAGNOSIS — R278 Other lack of coordination: Secondary | ICD-10-CM | POA: Diagnosis not present

## 2024-03-30 DIAGNOSIS — M6281 Muscle weakness (generalized): Secondary | ICD-10-CM | POA: Diagnosis not present

## 2024-03-31 DIAGNOSIS — I70235 Atherosclerosis of native arteries of right leg with ulceration of other part of foot: Secondary | ICD-10-CM | POA: Diagnosis not present

## 2024-03-31 DIAGNOSIS — R278 Other lack of coordination: Secondary | ICD-10-CM | POA: Diagnosis not present

## 2024-03-31 DIAGNOSIS — M6281 Muscle weakness (generalized): Secondary | ICD-10-CM | POA: Diagnosis not present

## 2024-04-01 DIAGNOSIS — R278 Other lack of coordination: Secondary | ICD-10-CM | POA: Diagnosis not present

## 2024-04-01 DIAGNOSIS — M6281 Muscle weakness (generalized): Secondary | ICD-10-CM | POA: Diagnosis not present

## 2024-04-01 DIAGNOSIS — I70235 Atherosclerosis of native arteries of right leg with ulceration of other part of foot: Secondary | ICD-10-CM | POA: Diagnosis not present

## 2024-04-04 DIAGNOSIS — I70235 Atherosclerosis of native arteries of right leg with ulceration of other part of foot: Secondary | ICD-10-CM | POA: Diagnosis not present

## 2024-04-04 DIAGNOSIS — M6281 Muscle weakness (generalized): Secondary | ICD-10-CM | POA: Diagnosis not present

## 2024-04-04 DIAGNOSIS — R278 Other lack of coordination: Secondary | ICD-10-CM | POA: Diagnosis not present

## 2024-04-05 DIAGNOSIS — M6281 Muscle weakness (generalized): Secondary | ICD-10-CM | POA: Diagnosis not present

## 2024-04-05 DIAGNOSIS — I70235 Atherosclerosis of native arteries of right leg with ulceration of other part of foot: Secondary | ICD-10-CM | POA: Diagnosis not present

## 2024-04-05 DIAGNOSIS — R278 Other lack of coordination: Secondary | ICD-10-CM | POA: Diagnosis not present

## 2024-04-06 DIAGNOSIS — I70235 Atherosclerosis of native arteries of right leg with ulceration of other part of foot: Secondary | ICD-10-CM | POA: Diagnosis not present

## 2024-04-06 DIAGNOSIS — M6281 Muscle weakness (generalized): Secondary | ICD-10-CM | POA: Diagnosis not present

## 2024-04-06 DIAGNOSIS — R278 Other lack of coordination: Secondary | ICD-10-CM | POA: Diagnosis not present

## 2024-04-09 DIAGNOSIS — M6281 Muscle weakness (generalized): Secondary | ICD-10-CM | POA: Diagnosis not present

## 2024-04-09 DIAGNOSIS — R278 Other lack of coordination: Secondary | ICD-10-CM | POA: Diagnosis not present

## 2024-04-09 DIAGNOSIS — I70235 Atherosclerosis of native arteries of right leg with ulceration of other part of foot: Secondary | ICD-10-CM | POA: Diagnosis not present

## 2024-04-10 DIAGNOSIS — M6281 Muscle weakness (generalized): Secondary | ICD-10-CM | POA: Diagnosis not present

## 2024-04-10 DIAGNOSIS — I70235 Atherosclerosis of native arteries of right leg with ulceration of other part of foot: Secondary | ICD-10-CM | POA: Diagnosis not present

## 2024-04-10 DIAGNOSIS — R278 Other lack of coordination: Secondary | ICD-10-CM | POA: Diagnosis not present

## 2024-04-11 DIAGNOSIS — M6281 Muscle weakness (generalized): Secondary | ICD-10-CM | POA: Diagnosis not present

## 2024-04-11 DIAGNOSIS — I70235 Atherosclerosis of native arteries of right leg with ulceration of other part of foot: Secondary | ICD-10-CM | POA: Diagnosis not present

## 2024-04-11 DIAGNOSIS — R278 Other lack of coordination: Secondary | ICD-10-CM | POA: Diagnosis not present

## 2024-04-11 DIAGNOSIS — I10 Essential (primary) hypertension: Secondary | ICD-10-CM | POA: Diagnosis not present

## 2024-04-12 DIAGNOSIS — R278 Other lack of coordination: Secondary | ICD-10-CM | POA: Diagnosis not present

## 2024-04-12 DIAGNOSIS — M6281 Muscle weakness (generalized): Secondary | ICD-10-CM | POA: Diagnosis not present

## 2024-04-12 DIAGNOSIS — G8929 Other chronic pain: Secondary | ICD-10-CM | POA: Diagnosis not present

## 2024-04-12 DIAGNOSIS — E785 Hyperlipidemia, unspecified: Secondary | ICD-10-CM | POA: Diagnosis not present

## 2024-04-12 DIAGNOSIS — I70235 Atherosclerosis of native arteries of right leg with ulceration of other part of foot: Secondary | ICD-10-CM | POA: Diagnosis not present

## 2024-04-12 DIAGNOSIS — I2510 Atherosclerotic heart disease of native coronary artery without angina pectoris: Secondary | ICD-10-CM | POA: Diagnosis not present

## 2024-04-12 DIAGNOSIS — R5381 Other malaise: Secondary | ICD-10-CM | POA: Diagnosis not present

## 2024-04-13 DIAGNOSIS — I70235 Atherosclerosis of native arteries of right leg with ulceration of other part of foot: Secondary | ICD-10-CM | POA: Diagnosis not present

## 2024-04-13 DIAGNOSIS — R278 Other lack of coordination: Secondary | ICD-10-CM | POA: Diagnosis not present

## 2024-04-13 DIAGNOSIS — M6281 Muscle weakness (generalized): Secondary | ICD-10-CM | POA: Diagnosis not present

## 2024-04-14 DIAGNOSIS — I70235 Atherosclerosis of native arteries of right leg with ulceration of other part of foot: Secondary | ICD-10-CM | POA: Diagnosis not present

## 2024-04-14 DIAGNOSIS — M6281 Muscle weakness (generalized): Secondary | ICD-10-CM | POA: Diagnosis not present

## 2024-04-14 DIAGNOSIS — R278 Other lack of coordination: Secondary | ICD-10-CM | POA: Diagnosis not present

## 2024-04-15 DIAGNOSIS — I70235 Atherosclerosis of native arteries of right leg with ulceration of other part of foot: Secondary | ICD-10-CM | POA: Diagnosis not present

## 2024-04-15 DIAGNOSIS — M6281 Muscle weakness (generalized): Secondary | ICD-10-CM | POA: Diagnosis not present

## 2024-04-15 DIAGNOSIS — R278 Other lack of coordination: Secondary | ICD-10-CM | POA: Diagnosis not present

## 2024-04-18 DIAGNOSIS — I70235 Atherosclerosis of native arteries of right leg with ulceration of other part of foot: Secondary | ICD-10-CM | POA: Diagnosis not present

## 2024-04-18 DIAGNOSIS — M6281 Muscle weakness (generalized): Secondary | ICD-10-CM | POA: Diagnosis not present

## 2024-04-18 DIAGNOSIS — R278 Other lack of coordination: Secondary | ICD-10-CM | POA: Diagnosis not present

## 2024-04-19 DIAGNOSIS — I70235 Atherosclerosis of native arteries of right leg with ulceration of other part of foot: Secondary | ICD-10-CM | POA: Diagnosis not present

## 2024-04-19 DIAGNOSIS — M6281 Muscle weakness (generalized): Secondary | ICD-10-CM | POA: Diagnosis not present

## 2024-04-19 DIAGNOSIS — R278 Other lack of coordination: Secondary | ICD-10-CM | POA: Diagnosis not present

## 2024-04-20 DIAGNOSIS — M6281 Muscle weakness (generalized): Secondary | ICD-10-CM | POA: Diagnosis not present

## 2024-04-20 DIAGNOSIS — I70235 Atherosclerosis of native arteries of right leg with ulceration of other part of foot: Secondary | ICD-10-CM | POA: Diagnosis not present

## 2024-04-20 DIAGNOSIS — R278 Other lack of coordination: Secondary | ICD-10-CM | POA: Diagnosis not present

## 2024-04-21 DIAGNOSIS — R278 Other lack of coordination: Secondary | ICD-10-CM | POA: Diagnosis not present

## 2024-04-21 DIAGNOSIS — M6281 Muscle weakness (generalized): Secondary | ICD-10-CM | POA: Diagnosis not present

## 2024-04-21 DIAGNOSIS — I70235 Atherosclerosis of native arteries of right leg with ulceration of other part of foot: Secondary | ICD-10-CM | POA: Diagnosis not present

## 2024-04-22 DIAGNOSIS — R278 Other lack of coordination: Secondary | ICD-10-CM | POA: Diagnosis not present

## 2024-04-22 DIAGNOSIS — M6281 Muscle weakness (generalized): Secondary | ICD-10-CM | POA: Diagnosis not present

## 2024-04-22 DIAGNOSIS — I70235 Atherosclerosis of native arteries of right leg with ulceration of other part of foot: Secondary | ICD-10-CM | POA: Diagnosis not present

## 2024-04-25 DIAGNOSIS — I70235 Atherosclerosis of native arteries of right leg with ulceration of other part of foot: Secondary | ICD-10-CM | POA: Diagnosis not present

## 2024-04-25 DIAGNOSIS — R278 Other lack of coordination: Secondary | ICD-10-CM | POA: Diagnosis not present

## 2024-04-25 DIAGNOSIS — M6281 Muscle weakness (generalized): Secondary | ICD-10-CM | POA: Diagnosis not present

## 2024-04-26 DIAGNOSIS — I70235 Atherosclerosis of native arteries of right leg with ulceration of other part of foot: Secondary | ICD-10-CM | POA: Diagnosis not present

## 2024-04-26 DIAGNOSIS — M6281 Muscle weakness (generalized): Secondary | ICD-10-CM | POA: Diagnosis not present

## 2024-04-26 DIAGNOSIS — R278 Other lack of coordination: Secondary | ICD-10-CM | POA: Diagnosis not present

## 2024-04-27 DIAGNOSIS — R278 Other lack of coordination: Secondary | ICD-10-CM | POA: Diagnosis not present

## 2024-04-27 DIAGNOSIS — M6281 Muscle weakness (generalized): Secondary | ICD-10-CM | POA: Diagnosis not present

## 2024-04-27 DIAGNOSIS — I70235 Atherosclerosis of native arteries of right leg with ulceration of other part of foot: Secondary | ICD-10-CM | POA: Diagnosis not present

## 2024-04-29 DIAGNOSIS — L97919 Non-pressure chronic ulcer of unspecified part of right lower leg with unspecified severity: Secondary | ICD-10-CM | POA: Diagnosis not present

## 2024-04-29 DIAGNOSIS — M109 Gout, unspecified: Secondary | ICD-10-CM | POA: Diagnosis not present

## 2024-04-29 DIAGNOSIS — I2510 Atherosclerotic heart disease of native coronary artery without angina pectoris: Secondary | ICD-10-CM | POA: Diagnosis not present

## 2024-04-29 DIAGNOSIS — G8929 Other chronic pain: Secondary | ICD-10-CM | POA: Diagnosis not present

## 2024-04-29 DIAGNOSIS — M170 Bilateral primary osteoarthritis of knee: Secondary | ICD-10-CM | POA: Diagnosis not present

## 2024-04-29 DIAGNOSIS — F1120 Opioid dependence, uncomplicated: Secondary | ICD-10-CM | POA: Diagnosis not present

## 2024-04-29 DIAGNOSIS — R5381 Other malaise: Secondary | ICD-10-CM | POA: Diagnosis not present

## 2024-04-29 DIAGNOSIS — E785 Hyperlipidemia, unspecified: Secondary | ICD-10-CM | POA: Diagnosis not present

## 2024-05-02 DIAGNOSIS — E785 Hyperlipidemia, unspecified: Secondary | ICD-10-CM | POA: Diagnosis not present

## 2024-05-02 DIAGNOSIS — I2510 Atherosclerotic heart disease of native coronary artery without angina pectoris: Secondary | ICD-10-CM | POA: Diagnosis not present

## 2024-05-02 DIAGNOSIS — G8929 Other chronic pain: Secondary | ICD-10-CM | POA: Diagnosis not present

## 2024-05-02 DIAGNOSIS — L97919 Non-pressure chronic ulcer of unspecified part of right lower leg with unspecified severity: Secondary | ICD-10-CM | POA: Diagnosis not present

## 2024-05-02 DIAGNOSIS — M170 Bilateral primary osteoarthritis of knee: Secondary | ICD-10-CM | POA: Diagnosis not present

## 2024-05-02 DIAGNOSIS — M109 Gout, unspecified: Secondary | ICD-10-CM | POA: Diagnosis not present

## 2024-05-02 DIAGNOSIS — F1120 Opioid dependence, uncomplicated: Secondary | ICD-10-CM | POA: Diagnosis not present

## 2024-05-06 DIAGNOSIS — I70235 Atherosclerosis of native arteries of right leg with ulceration of other part of foot: Secondary | ICD-10-CM | POA: Diagnosis not present

## 2024-05-06 DIAGNOSIS — L97519 Non-pressure chronic ulcer of other part of right foot with unspecified severity: Secondary | ICD-10-CM | POA: Diagnosis not present

## 2024-05-06 DIAGNOSIS — M109 Gout, unspecified: Secondary | ICD-10-CM | POA: Diagnosis not present

## 2024-05-06 DIAGNOSIS — M170 Bilateral primary osteoarthritis of knee: Secondary | ICD-10-CM | POA: Diagnosis not present

## 2024-05-06 DIAGNOSIS — E785 Hyperlipidemia, unspecified: Secondary | ICD-10-CM | POA: Diagnosis not present

## 2024-05-06 DIAGNOSIS — G8929 Other chronic pain: Secondary | ICD-10-CM | POA: Diagnosis not present

## 2024-05-06 DIAGNOSIS — I2510 Atherosclerotic heart disease of native coronary artery without angina pectoris: Secondary | ICD-10-CM | POA: Diagnosis not present

## 2024-05-16 DIAGNOSIS — L97519 Non-pressure chronic ulcer of other part of right foot with unspecified severity: Secondary | ICD-10-CM | POA: Diagnosis not present

## 2024-05-16 DIAGNOSIS — I70235 Atherosclerosis of native arteries of right leg with ulceration of other part of foot: Secondary | ICD-10-CM | POA: Diagnosis not present

## 2024-05-16 DIAGNOSIS — I2510 Atherosclerotic heart disease of native coronary artery without angina pectoris: Secondary | ICD-10-CM | POA: Diagnosis not present

## 2024-05-16 DIAGNOSIS — R5381 Other malaise: Secondary | ICD-10-CM | POA: Diagnosis not present

## 2024-05-16 DIAGNOSIS — M109 Gout, unspecified: Secondary | ICD-10-CM | POA: Diagnosis not present

## 2024-05-16 DIAGNOSIS — M170 Bilateral primary osteoarthritis of knee: Secondary | ICD-10-CM | POA: Diagnosis not present

## 2024-05-16 DIAGNOSIS — L97919 Non-pressure chronic ulcer of unspecified part of right lower leg with unspecified severity: Secondary | ICD-10-CM | POA: Diagnosis not present

## 2024-05-16 DIAGNOSIS — E785 Hyperlipidemia, unspecified: Secondary | ICD-10-CM | POA: Diagnosis not present

## 2024-05-16 DIAGNOSIS — G8929 Other chronic pain: Secondary | ICD-10-CM | POA: Diagnosis not present

## 2024-05-19 DIAGNOSIS — I70235 Atherosclerosis of native arteries of right leg with ulceration of other part of foot: Secondary | ICD-10-CM | POA: Diagnosis not present

## 2024-05-19 DIAGNOSIS — Z23 Encounter for immunization: Secondary | ICD-10-CM | POA: Diagnosis not present

## 2024-05-19 DIAGNOSIS — R278 Other lack of coordination: Secondary | ICD-10-CM | POA: Diagnosis not present

## 2024-07-31 NOTE — Progress Notes (Deleted)
 Patient name: Jack [name] MRN: [id number] DOB: 10-Jan-1946 Sex: male  REASON FOR CONSULT: 6 month follow-up  HPI: Jack [name] is a 77 y.o. male, with multiple comorbidities that was seen in the hospital for consultation on 09/12/2023 for bilateral lower extremity tissue loss with large heel eschar on bilateral lower extremities and now presents for 6 month ongoing follow-up.  During his initial hospital stay he underwent angiogram showing bilateral flush SFA occlusions and would require tibial bypass.  He has limited to no mobility.  He was offered bilateral above-knee amputations versus palliative wound care in the hospital.  Ultimately elected for palliative wound care.  Last seen using a Hoyer lift for transfers still not wanting above-knee amputations.  Past Medical History:  Diagnosis Date   Bradycardia    Coronary atherosclerosis of native coronary artery    Multivessel status post CABG   History of kidney stones    Hyperlipidemia    Hypertension    Peripheral vascular disease    Tobacco user     Past Surgical History:  Procedure Laterality Date   ABDOMINAL AORTOGRAM W/LOWER EXTREMITY Bilateral 09/18/2023   Procedure: ABDOMINAL AORTOGRAM W/LOWER EXTREMITY;  Surgeon: Gretta Lonni PARAS, MD;  [facility];  Service: Cardiovascular;  Laterality: Bilateral;   APPENDECTOMY     age 46   CHOLECYSTECTOMY  08-31-02   COLONOSCOPY N/A 04/21/2018   Procedure: COLONOSCOPY;  Surgeon: Golda Claudis PENNER, MD;  [facility]: [facility];  Service: Endoscopy;  Laterality: N/A;  12:00-rescheduled to 8/28 @ 10:30am per Jenkins   CORONARY ARTERY BYPASS GRAFT  Sep 01, 2007   LIMA to LAD, SVG to ramus, SVG to PDA and PLA   EXTRACORPOREAL SHOCK WAVE LITHOTRIPSY Left 12/02/2018   Procedure: EXTRACORPOREAL SHOCK WAVE LITHOTRIPSY (ESWL);  Surgeon: Carolee Sherwood JONETTA DOUGLAS, MD;  Location: [facility];  Service: Urology;  Laterality: Left;   FOOT SURGERY     KNEE ARTHROPLASTY     POLYPECTOMY  04/21/2018    Procedure: POLYPECTOMY;  Surgeon: Golda Claudis PENNER, MD;  [facility];  Service: Endoscopy;;   ROTATOR CUFF REPAIR     SHOULDER SURGERY      Family History  Problem Relation Age of Onset   Heart attack Mother    Cerebral aneurysm Father    Coronary artery disease Neg Hx    Diabetes Neg Hx    Hypertension Neg Hx     SOCIAL HISTORY: Social History   Socioeconomic History   Marital status: Widowed    Spouse name: Not on file   Number of children: Not on file   Years of education: Not on file   Highest education level: Not on file  Occupational History   Occupation: retired  Tobacco Use   Smoking status: Former    Current packs/day: 0.00    Average packs/day: 1 pack/day for 64.2 years (64.2 ttl pk-yrs)    Types: Cigarettes    Start date: 06/03/1959    Quit date: 08/30/2023    Years since quitting: 0.9   Smokeless tobacco: Never  Vaping Use   Vaping status: Never Used  Substance and Sexual Activity   Alcohol use: Yes    Alcohol/week: 0.0 standard drinks of alcohol   Drug use: No   Sexual activity: Not on file  Other Topics Concern   Not on file  Social History Narrative   Had been married for over 51 years and wife passed away in 2016-08-31 from cancer. Has children and grandchildren  and great grandchildren. Gets together with family for dinner frequently. Lives alone and cooks for self. Live in [place]. 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 Eats all food groups.     Social Drivers of Corporate Investment Banker Strain: Low Risk (09/04/2023)   Received from [facility]   Overall Financial Resource Strain (CARDIA)    Difficulty of Paying Living Expenses: Not hard at all  Food Insecurity: No Food Insecurity (09/11/2023)   Hunger Vital Sign    Worried About Running Out of Food in the Last Year: Never true    Ran Out of Food in the Last Year: Never true  Transportation Needs: No Transportation  Needs (09/11/2023)   PRAPARE - Administrator, Civil Service (Medical): No    Lack of Transportation (Non-Medical): No  Physical Activity: Not on file  Stress: Not on file  Social Connections: Socially Isolated (09/11/2023)   Social Connection and Isolation Panel    Frequency of Communication with Friends and Family: More than three times a week    Frequency of Social Gatherings with Friends and Family: Twice a week    Attends Religious Services: Never    Database Administrator or Organizations: No    Attends Banker Meetings: Patient unable to answer    Marital Status: Widowed  Intimate Partner Violence: Not At Risk (09/11/2023)   Humiliation, Afraid, Rape, and Kick questionnaire    Fear of Current or Ex-Partner: No    Emotionally Abused: No    Physically Abused: No    Sexually Abused: No    No Known Allergies  Current Outpatient Medications  Medication Sig Dispense Refill   acetaminophen  (TYLENOL ) 325 MG tablet Take 2 tablets (650 mg total) by mouth every 6 (six) hours as needed for mild pain (pain score 1-3) (or Fever >/= 101).     allopurinol (ZYLOPRIM) 100 MG tablet Take 100 mg by mouth daily.     ascorbic acid  (VITAMIN C ) 250 MG tablet Take 1 tablet (250 mg total) by mouth 2 (two) times daily.     aspirin  EC 81 MG tablet Take 1 tablet (81 mg total) by mouth daily. Swallow whole.     atorvastatin (LIPITOR) 40 MG tablet Take 40 mg by mouth daily.     escitalopram  (LEXAPRO ) 5 MG tablet Take 1 tablet (5 mg total) by mouth daily.     feeding supplement (ENSURE ENLIVE / ENSURE PLUS) LIQD Take 237 mLs by mouth 2 (two) times daily between meals.     Multiple Vitamin (MULTIVITAMIN WITH MINERALS) TABS tablet Take 1 tablet by mouth daily.     oxyCODONE -acetaminophen  (PERCOCET/ROXICET) 5-325 MG tablet Take 1 tablet by mouth every 4 (four) hours as needed for moderate pain (pain score 4-6). (Patient not taking: Reported on 02/02/2024) 30 tablet 0   polyethylene glycol  (MIRALAX  / GLYCOLAX ) 17 g packet Take 17 g by mouth 2 (two) times daily.     rosuvastatin  (CRESTOR ) 20 MG tablet Take 1 tablet (20 mg total) by mouth daily. (Patient not taking: Reported on 02/02/2024)     senna-docusate (SENOKOT-S) 8.6-50 MG tablet Take 2 tablets by mouth at bedtime.     traMADol (ULTRAM) 50 MG tablet Take by mouth every 6 (six) hours as needed.     No current facility-administered medications for this visit.    REVIEW OF SYSTEMS:  [X]  denotes positive finding, [ ]  denotes  negative finding Cardiac  Comments:  Chest pain or chest pressure:    Shortness of breath upon exertion:    Short of breath when lying flat:    Irregular heart rhythm:        Vascular    Pain in calf, thigh, or hip brought on by ambulation:    Pain in feet at night that wakes you up from your sleep:     Blood clot in your veins:    Leg swelling:         Pulmonary    Oxygen at home:    Productive cough:     Wheezing:         Neurologic    Sudden weakness in arms or legs:     Sudden numbness in arms or legs:     Sudden onset of difficulty speaking or slurred speech:    Temporary loss of vision in one eye:     Problems with dizziness:         Gastrointestinal    Blood in stool:     Vomited blood:         Genitourinary    Burning when urinating:     Blood in urine:        Psychiatric    Major depression:         Hematologic    Bleeding problems:    Problems with blood clotting too easily:        Skin    Rashes or ulcers:        Constitutional    Fever or chills:      PHYSICAL EXAM: There were no vitals filed for this visit.   GENERAL: The patient is a well-nourished male, in no acute distress. The vital signs are documented above. CARDIAC: There is a regular rate and rhythm.  VASCULAR:  Right heel eschar healed Left heel wound persistent No palpable pedal pulses PULMONARY: No respiratory distress ABDOMEN: Soft and non-tender  MUSCULOSKELETAL: There are no major  deformities or cyanosis. NEUROLOGIC: No focal weakness or paresthesias are detected. PSYCHIATRIC: The patient has a normal affect.  DATA:   ABIs in the hospital on 09/02/2023 were 0.37 on the right monophasic and 0.32 on the left monophasic  Assessment/Plan:  78 y.o. male, with multiple comorbidities that was seen in the hospital for consultation on 09/12/2023 for bilateral lower extremity tissue loss with large heel eschar that presents for ongoing 6 month interval follow-up.  Ultimately underwent angiogram during initial hospital stay showing bilateral flush SFA occlusions and would need tibial bypass.  He has limited to no mobility.  He was offered bilateral above-knee amputations versus palliative wound care in the hospital.  Ultimately elected for palliative wound care.  Again discussed he could continue palliative wound care versus considering above-knee amputation if he elected.  Not a candidate for tibial bypass as we previously discussed.   Lonni DOROTHA Gaskins, MD [facility] Office: [phone]

## 2024-08-02 ENCOUNTER — Ambulatory Visit: Admitting: Vascular Surgery

## 2024-08-29 NOTE — Progress Notes (Signed)
 "   Patient name: Jack [name] MRN: [id number] DOB: 08-17-1946 Sex: male  REASON FOR CONSULT: 6 month follow-up  HPI: Jack [name] is a 79 y.o. male, with multiple comorbidities that was seen in the hospital for consultation on 09/12/2023 for bilateral lower extremity tissue loss with large heel eschar on bilateral lower extremities and now presents for 6 month ongoing follow-up.  During his initial hospital stay he underwent angiogram showing bilateral flush SFA occlusions and would require tibial bypass.  He has limited to no mobility.  He was offered bilateral above-knee amputations versus palliative wound care in the hospital.  Ultimately elected for palliative wound care.  Last seen using a Hoyer lift for transfers still not wanting above-knee amputations.  Today still in the [facility].  Nursing states still requires Deitra transfers and not walking or standing.  Both of his heel ulcers have completely healed.  Past Medical History:  Diagnosis Date   Bradycardia    Coronary atherosclerosis of native coronary artery    Multivessel status post CABG   History of kidney stones    Hyperlipidemia    Hypertension    Peripheral vascular disease    Tobacco user     Past Surgical History:  Procedure Laterality Date   ABDOMINAL AORTOGRAM W/LOWER EXTREMITY Bilateral 09/18/2023   Procedure: ABDOMINAL AORTOGRAM W/LOWER EXTREMITY;  Surgeon: Gretta Lonni PARAS, MD;  [facility];  Service: Cardiovascular;  Laterality: Bilateral;   APPENDECTOMY     age 27   CHOLECYSTECTOMY  2003   COLONOSCOPY N/A 04/21/2018   Procedure: COLONOSCOPY;  Surgeon: Golda Claudis PENNER, MD;  [facility];  Service: Endoscopy;  Laterality: N/A;  12:00-rescheduled to 8/28 @ 10:30am per Jenkins   CORONARY ARTERY BYPASS GRAFT  2008   LIMA to LAD, SVG to ramus, SVG to PDA and PLA   EXTRACORPOREAL SHOCK WAVE LITHOTRIPSY Left 12/02/2018   Procedure: EXTRACORPOREAL SHOCK WAVE LITHOTRIPSY (ESWL);   Surgeon: Carolee Sherwood JONETTA DOUGLAS, MD;  Location: [facility];  Service: Urology;  Laterality: Left;   FOOT SURGERY     KNEE ARTHROPLASTY     POLYPECTOMY  04/21/2018   Procedure: POLYPECTOMY;  Surgeon: Golda Claudis PENNER, MD;  [facility];  Service: Endoscopy;;   ROTATOR CUFF REPAIR     SHOULDER SURGERY      Family History  Problem Relation Age of Onset   Heart attack Mother    Cerebral aneurysm Father    Coronary artery disease Neg Hx    Diabetes Neg Hx    Hypertension Neg Hx     SOCIAL HISTORY: Social History   Socioeconomic History   Marital status: Widowed    Spouse name: Not on file   Number of children: Not on file   Years of education: Not on file   Highest education level: Not on file  Occupational History   Occupation: retired  Tobacco Use   Smoking status: Former    Current packs/day: 0.00    Average packs/day: 1 pack/day for 64.2 years (64.2 ttl pk-yrs)    Types: Cigarettes    Start date: 06/03/1959    Quit date: 08/30/2023    Years since quitting: 1.0   Smokeless tobacco: Never  Vaping Use   Vaping status: Never Used  Substance and Sexual Activity   Alcohol use: Yes    Alcohol/week: 0.0 standard drinks of alcohol   Drug use: No   Sexual activity: Not on file  Other Topics Concern  Not on file  Social History Narrative   Had been married for over 51 years and wife passed away in Sep 17, 2015 from cancer. Has children and grandchildren and great grandchildren. Gets together with family for dinner frequently. Lives alone and cooks for self. Live in [place]. 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 Eats all food groups.     Social Drivers of Health   Tobacco Use: High Risk (08/02/2024)   Received from [facility]   Patient History    Smoking Tobacco Use: Every Day    Smokeless Tobacco Use: Never    Passive Exposure: Not on file  Financial Resource Strain: Low Risk (08/01/2024)   Received from  [facility]   Overall Financial Resource Strain (CARDIA)    How hard is it for you to pay for the very basics like food, housing, medical care, and heating?: Not hard at all  Food Insecurity: No Food Insecurity (08/01/2024)   Received from [facility]   Epic    Within the past 12 months, you worried that your food would run out before you got the money to buy more.: Never true    Within the past 12 months, the food you bought just didn't last and you didn't have money to get more.: Never true  Transportation Needs: No Transportation Needs (08/01/2024)   Received from [facility] - Transportation    Lack of Transportation (Medical): No    Lack of Transportation (Non-Medical): No  Physical Activity: Inactive (08/01/2024)   Received from [facility]   Exercise Vital Sign    On average, how many days per week do you engage in moderate to strenuous exercise (like a brisk walk)?: 0 days    On average, how many minutes do you engage in exercise at this level?: 0 min  Stress: Stress Concern Present (08/01/2024)   Received from [facility] - Occupational Stress Questionnaire    Do you feel stress - tense, restless, nervous, or anxious, or unable to sleep at night because your mind is troubled all the time - these days?: Rather much  Social Connections: Socially Isolated (08/01/2024)   Received from [facility]   Social Connection and Isolation Panel    In a typical week, how many times do you talk on the phone with family, friends, or neighbors?: Twice a week    How often do you get together with friends or relatives?: Never    How often do you attend church or religious services?: Never    Do you belong to any clubs or organizations such as church groups, unions, fraternal or athletic groups, or school groups?: No    How often do you attend meetings of the clubs or organizations you belong to?: Never    Are you married,  widowed, divorced, separated, never married, or living with a partner?: Widowed  Intimate Partner Violence: Not At Risk (08/01/2024)   Received from [facility]   Epic    Within the last year, have you been afraid of your partner or ex-partner?: No    Within the last year, have you been humiliated or emotionally abused in other ways by your partner or ex-partner?: No    Within the last year, have you been kicked, hit, slapped, or otherwise physically hurt by your partner or ex-partner?: No  Within the last year, have you been raped or forced to have any kind of sexual activity by your partner or ex-partner?: No  Depression (PHQ2-9): Not on file  Alcohol Screen: Not on file  Housing: Low Risk (09/11/2023)   Housing Stability Vital Sign    Unable to Pay for Housing in the Last Year: No    Number of Times Moved in the Last Year: 0    Homeless in the Last Year: No  Utilities: Low Risk (08/01/2024)   Received from [facility]   Utilities    Within the past 12 months, have you been unable to get utilities(heat, electricity) when it was really needed?: No  Health Literacy: Medium Risk (08/01/2024)   Received from [facility] Literacy    How often do you need to have someone help you when you read instructions, pamphlets, or other written material from your doctor or pharmacy?: Rarely    No Known Allergies  Current Outpatient Medications  Medication Sig Dispense Refill   acetaminophen  (TYLENOL ) 325 MG tablet Take 2 tablets (650 mg total) by mouth every 6 (six) hours as needed for mild pain (pain score 1-3) (or Fever >/= 101).     allopurinol (ZYLOPRIM) 100 MG tablet Take 100 mg by mouth daily.     ascorbic acid  (VITAMIN C ) 250 MG tablet Take 1 tablet (250 mg total) by mouth 2 (two) times daily.     aspirin  EC 81 MG tablet Take 1 tablet (81 mg total) by mouth daily. Swallow whole.     atorvastatin (LIPITOR) 40 MG tablet Take 40 mg by mouth daily.     escitalopram  (LEXAPRO )  5 MG tablet Take 1 tablet (5 mg total) by mouth daily.     feeding supplement (ENSURE ENLIVE / ENSURE PLUS) LIQD Take 237 mLs by mouth 2 (two) times daily between meals.     Multiple Vitamin (MULTIVITAMIN WITH MINERALS) TABS tablet Take 1 tablet by mouth daily.     oxyCODONE -acetaminophen  (PERCOCET/ROXICET) 5-325 MG tablet Take 1 tablet by mouth every 4 (four) hours as needed for moderate pain (pain score 4-6). (Patient not taking: Reported on 02/02/2024) 30 tablet 0   polyethylene glycol (MIRALAX  / GLYCOLAX ) 17 g packet Take 17 g by mouth 2 (two) times daily.     rosuvastatin  (CRESTOR ) 20 MG tablet Take 1 tablet (20 mg total) by mouth daily. (Patient not taking: Reported on 02/02/2024)     senna-docusate (SENOKOT-S) 8.6-50 MG tablet Take 2 tablets by mouth at bedtime.     traMADol (ULTRAM) 50 MG tablet Take by mouth every 6 (six) hours as needed.     No current facility-administered medications for this visit.    REVIEW OF SYSTEMS:  [X]  denotes positive finding, [ ]  denotes negative finding Cardiac  Comments:  Chest pain or chest pressure:    Shortness of breath upon exertion:    Short of breath when lying flat:    Irregular heart rhythm:        Vascular    Pain in calf, thigh, or hip brought on by ambulation:    Pain in feet at night that wakes you up from your sleep:     Blood clot in your veins:    Leg swelling:         Pulmonary    Oxygen at home:    Productive cough:     Wheezing:         Neurologic    Sudden weakness  in arms or legs:     Sudden numbness in arms or legs:     Sudden onset of difficulty speaking or slurred speech:    Temporary loss of vision in one eye:     Problems with dizziness:         Gastrointestinal    Blood in stool:     Vomited blood:         Genitourinary    Burning when urinating:     Blood in urine:        Psychiatric    Major depression:         Hematologic    Bleeding problems:    Problems with blood clotting too easily:         Skin    Rashes or ulcers:        Constitutional    Fever or chills:      PHYSICAL EXAM: There were no vitals filed for this visit.   GENERAL: The patient is a well-nourished male, in no acute distress. The vital signs are documented above. CARDIAC: There is a regular rate and rhythm.  VASCULAR:  Bilateral heel eschars completely healed PULMONARY: No respiratory distress ABDOMEN: Soft and non-tender  MUSCULOSKELETAL: There are no major deformities or cyanosis. NEUROLOGIC: No focal weakness or paresthesias are detected. PSYCHIATRIC: The patient has a normal affect.  DATA:   ABIs in the hospital on 09/02/2023 were 0.37 on the right monophasic and 0.32 on the left monophasic  Assessment/Plan:  79 y.o. male, with multiple comorbidities that was seen in the hospital for consultation on 09/12/2023 for bilateral lower extremity tissue loss with large heel eschar that presents for ongoing 6 month interval follow-up.  Ultimately underwent angiogram during initial hospital stay showing bilateral flush SFA occlusions and would need tibial bypass.  He has limited to no mobility.  He was offered bilateral above-knee amputations versus palliative wound care in the hospital.  Ultimately elected for palliative wound care.  Fortunately with excellent wound care he has healed both of his heel eschars.  I am quite surprised that these have completely improved without revascularization.  Excellent work offloading his heels with heel protectors and wound care.  He can follow-up with me as needed.   Lonni DOROTHA Gaskins, MD [facility] [place] Office: [phone]     "

## 2024-08-30 ENCOUNTER — Encounter: Payer: Self-pay | Admitting: Vascular Surgery

## 2024-08-30 ENCOUNTER — Ambulatory Visit: Admitting: Vascular Surgery

## 2024-08-30 VITALS — BP 100/62 | HR 66 | Temp 98.0°F | Resp 24 | Ht 70.0 in | Wt 221.0 lb

## 2024-08-30 DIAGNOSIS — I70223 Atherosclerosis of native arteries of extremities with rest pain, bilateral legs: Secondary | ICD-10-CM | POA: Diagnosis not present

## 2024-09-06 ENCOUNTER — Ambulatory Visit: Admitting: Vascular Surgery
